# Patient Record
Sex: Female | Born: 1940 | Race: Black or African American | Hispanic: No | Marital: Married | Smoking: Never smoker
Health system: Southern US, Community
[De-identification: ages and names within clinical notes are randomized; demographics above are authoritative.]

## PROBLEM LIST (undated history)

## (undated) DIAGNOSIS — M1990 Unspecified osteoarthritis, unspecified site: Secondary | ICD-10-CM

## (undated) DIAGNOSIS — K219 Gastro-esophageal reflux disease without esophagitis: Secondary | ICD-10-CM

## (undated) DIAGNOSIS — R0602 Shortness of breath: Secondary | ICD-10-CM

## (undated) DIAGNOSIS — I499 Cardiac arrhythmia, unspecified: Secondary | ICD-10-CM

## (undated) DIAGNOSIS — I10 Essential (primary) hypertension: Secondary | ICD-10-CM

## (undated) DIAGNOSIS — I739 Peripheral vascular disease, unspecified: Secondary | ICD-10-CM

## (undated) DIAGNOSIS — E119 Type 2 diabetes mellitus without complications: Secondary | ICD-10-CM

## (undated) DIAGNOSIS — Z8719 Personal history of other diseases of the digestive system: Secondary | ICD-10-CM

## (undated) HISTORY — PX: EYE SURGERY: SHX253

## (undated) HISTORY — PX: JOINT REPLACEMENT: SHX530

## (undated) SURGERY — ARTHROPLASTY, HIP, TOTAL,POSTERIOR APPROACH
Anesthesia: General | Laterality: Left

---

## 1998-08-24 ENCOUNTER — Emergency Department (HOSPITAL_COMMUNITY): Admission: EM | Admit: 1998-08-24 | Discharge: 1998-08-24 | Payer: Self-pay | Admitting: Emergency Medicine

## 1999-03-20 ENCOUNTER — Other Ambulatory Visit: Admission: RE | Admit: 1999-03-20 | Discharge: 1999-03-20 | Payer: Self-pay | Admitting: Obstetrics

## 1999-04-23 ENCOUNTER — Encounter: Payer: Self-pay | Admitting: Emergency Medicine

## 1999-04-23 ENCOUNTER — Emergency Department (HOSPITAL_COMMUNITY): Admission: EM | Admit: 1999-04-23 | Discharge: 1999-04-23 | Payer: Self-pay | Admitting: Emergency Medicine

## 1999-05-08 ENCOUNTER — Encounter: Admission: RE | Admit: 1999-05-08 | Discharge: 1999-05-21 | Payer: Self-pay | Admitting: Orthopedic Surgery

## 1999-09-09 ENCOUNTER — Other Ambulatory Visit: Admission: RE | Admit: 1999-09-09 | Discharge: 1999-09-09 | Payer: Self-pay | Admitting: Obstetrics

## 1999-09-09 ENCOUNTER — Encounter (INDEPENDENT_AMBULATORY_CARE_PROVIDER_SITE_OTHER): Payer: Self-pay | Admitting: Specialist

## 1999-11-21 ENCOUNTER — Ambulatory Visit (HOSPITAL_COMMUNITY): Admission: RE | Admit: 1999-11-21 | Discharge: 1999-11-21 | Payer: Self-pay | Admitting: Family Medicine

## 1999-11-21 ENCOUNTER — Encounter: Payer: Self-pay | Admitting: Family Medicine

## 2000-06-17 ENCOUNTER — Ambulatory Visit (HOSPITAL_COMMUNITY): Admission: RE | Admit: 2000-06-17 | Discharge: 2000-06-17 | Payer: Self-pay | Admitting: Family Medicine

## 2000-06-17 ENCOUNTER — Encounter: Payer: Self-pay | Admitting: Family Medicine

## 2000-09-23 ENCOUNTER — Other Ambulatory Visit: Admission: RE | Admit: 2000-09-23 | Discharge: 2000-09-23 | Payer: Self-pay | Admitting: Obstetrics

## 2000-09-29 ENCOUNTER — Encounter: Payer: Self-pay | Admitting: Obstetrics

## 2000-09-29 ENCOUNTER — Ambulatory Visit (HOSPITAL_COMMUNITY): Admission: RE | Admit: 2000-09-29 | Discharge: 2000-09-29 | Payer: Self-pay | Admitting: Obstetrics

## 2002-01-23 ENCOUNTER — Emergency Department (HOSPITAL_COMMUNITY): Admission: EM | Admit: 2002-01-23 | Discharge: 2002-01-23 | Payer: Self-pay | Admitting: Emergency Medicine

## 2002-01-23 ENCOUNTER — Encounter: Payer: Self-pay | Admitting: Emergency Medicine

## 2002-02-02 ENCOUNTER — Ambulatory Visit (HOSPITAL_COMMUNITY): Admission: RE | Admit: 2002-02-02 | Discharge: 2002-02-02 | Payer: Self-pay | Admitting: Family Medicine

## 2002-03-23 ENCOUNTER — Encounter: Payer: Self-pay | Admitting: Family Medicine

## 2002-03-23 ENCOUNTER — Ambulatory Visit (HOSPITAL_COMMUNITY): Admission: RE | Admit: 2002-03-23 | Discharge: 2002-03-23 | Payer: Self-pay | Admitting: Family Medicine

## 2002-03-30 ENCOUNTER — Encounter (INDEPENDENT_AMBULATORY_CARE_PROVIDER_SITE_OTHER): Payer: Self-pay | Admitting: Specialist

## 2002-03-30 ENCOUNTER — Encounter: Payer: Self-pay | Admitting: Family Medicine

## 2002-03-30 ENCOUNTER — Encounter: Admission: RE | Admit: 2002-03-30 | Discharge: 2002-03-30 | Payer: Self-pay | Admitting: Family Medicine

## 2002-06-12 ENCOUNTER — Other Ambulatory Visit: Admission: RE | Admit: 2002-06-12 | Discharge: 2002-06-12 | Payer: Self-pay | Admitting: Obstetrics and Gynecology

## 2005-02-02 ENCOUNTER — Ambulatory Visit (HOSPITAL_COMMUNITY): Admission: RE | Admit: 2005-02-02 | Discharge: 2005-02-02 | Payer: Self-pay | Admitting: Internal Medicine

## 2005-02-05 ENCOUNTER — Ambulatory Visit (HOSPITAL_COMMUNITY): Admission: RE | Admit: 2005-02-05 | Discharge: 2005-02-05 | Payer: Self-pay | Admitting: Obstetrics

## 2005-03-06 ENCOUNTER — Emergency Department (HOSPITAL_COMMUNITY): Admission: EM | Admit: 2005-03-06 | Discharge: 2005-03-07 | Payer: Self-pay | Admitting: Emergency Medicine

## 2005-07-03 ENCOUNTER — Encounter: Admission: RE | Admit: 2005-07-03 | Discharge: 2005-07-03 | Payer: Self-pay | Admitting: Internal Medicine

## 2005-09-17 ENCOUNTER — Ambulatory Visit (HOSPITAL_COMMUNITY): Admission: RE | Admit: 2005-09-17 | Discharge: 2005-09-17 | Payer: Self-pay | Admitting: Ophthalmology

## 2006-01-07 ENCOUNTER — Encounter: Admission: RE | Admit: 2006-01-07 | Discharge: 2006-01-07 | Payer: Self-pay | Admitting: Internal Medicine

## 2006-02-15 ENCOUNTER — Encounter: Admission: RE | Admit: 2006-02-15 | Discharge: 2006-02-15 | Payer: Self-pay | Admitting: Family Medicine

## 2006-12-31 ENCOUNTER — Encounter: Admission: RE | Admit: 2006-12-31 | Discharge: 2007-01-11 | Payer: Self-pay | Admitting: Orthopedic Surgery

## 2007-03-15 ENCOUNTER — Ambulatory Visit (HOSPITAL_COMMUNITY): Admission: RE | Admit: 2007-03-15 | Discharge: 2007-03-15 | Payer: Self-pay | Admitting: Ophthalmology

## 2007-04-07 ENCOUNTER — Ambulatory Visit (HOSPITAL_COMMUNITY): Admission: RE | Admit: 2007-04-07 | Discharge: 2007-04-07 | Payer: Self-pay | Admitting: Ophthalmology

## 2007-06-16 ENCOUNTER — Encounter: Admission: RE | Admit: 2007-06-16 | Discharge: 2007-06-16 | Payer: Self-pay | Admitting: Orthopedic Surgery

## 2007-12-13 ENCOUNTER — Encounter: Admission: RE | Admit: 2007-12-13 | Discharge: 2007-12-13 | Payer: Self-pay | Admitting: Internal Medicine

## 2008-01-10 ENCOUNTER — Encounter: Admission: RE | Admit: 2008-01-10 | Discharge: 2008-01-10 | Payer: Self-pay | Admitting: Internal Medicine

## 2008-02-23 ENCOUNTER — Encounter: Admission: RE | Admit: 2008-02-23 | Discharge: 2008-02-23 | Payer: Self-pay | Admitting: Orthopedic Surgery

## 2008-03-29 ENCOUNTER — Inpatient Hospital Stay (HOSPITAL_COMMUNITY): Admission: RE | Admit: 2008-03-29 | Discharge: 2008-04-02 | Payer: Self-pay | Admitting: Orthopedic Surgery

## 2009-04-09 ENCOUNTER — Encounter: Admission: RE | Admit: 2009-04-09 | Discharge: 2009-06-11 | Payer: Self-pay | Admitting: Orthopedic Surgery

## 2009-04-25 ENCOUNTER — Encounter: Admission: RE | Admit: 2009-04-25 | Discharge: 2009-04-25 | Payer: Self-pay | Admitting: Orthopedic Surgery

## 2009-07-08 ENCOUNTER — Ambulatory Visit (HOSPITAL_COMMUNITY): Admission: RE | Admit: 2009-07-08 | Discharge: 2009-07-08 | Payer: Self-pay | Admitting: Ophthalmology

## 2009-08-08 ENCOUNTER — Encounter: Admission: RE | Admit: 2009-08-08 | Discharge: 2009-08-08 | Payer: Self-pay | Admitting: Orthopedic Surgery

## 2010-01-23 ENCOUNTER — Ambulatory Visit (HOSPITAL_COMMUNITY): Admission: RE | Admit: 2010-01-23 | Discharge: 2010-01-23 | Payer: Self-pay | Admitting: Orthopedic Surgery

## 2010-03-04 ENCOUNTER — Encounter: Admission: RE | Admit: 2010-03-04 | Discharge: 2010-03-04 | Payer: Self-pay | Admitting: Orthopedic Surgery

## 2010-03-25 ENCOUNTER — Emergency Department (HOSPITAL_COMMUNITY): Admission: EM | Admit: 2010-03-25 | Discharge: 2010-03-25 | Payer: Self-pay | Admitting: Emergency Medicine

## 2010-05-07 ENCOUNTER — Ambulatory Visit (HOSPITAL_COMMUNITY): Admission: RE | Admit: 2010-05-07 | Discharge: 2010-05-07 | Payer: Self-pay | Admitting: Obstetrics

## 2010-05-14 ENCOUNTER — Ambulatory Visit (HOSPITAL_COMMUNITY): Admission: RE | Admit: 2010-05-14 | Discharge: 2010-05-14 | Payer: Self-pay | Admitting: Obstetrics

## 2010-05-22 ENCOUNTER — Ambulatory Visit (HOSPITAL_COMMUNITY): Admission: RE | Admit: 2010-05-22 | Discharge: 2010-05-22 | Payer: Self-pay | Admitting: Obstetrics

## 2010-09-18 ENCOUNTER — Encounter
Admission: RE | Admit: 2010-09-18 | Discharge: 2010-09-18 | Payer: Self-pay | Source: Home / Self Care | Attending: Orthopedic Surgery | Admitting: Orthopedic Surgery

## 2010-11-13 LAB — BASIC METABOLIC PANEL
Calcium: 9.1 mg/dL (ref 8.4–10.5)
Chloride: 104 mEq/L (ref 96–112)
Creatinine, Ser: 1.02 mg/dL (ref 0.4–1.2)
GFR calc Af Amer: >60 mL/min (ref >60)

## 2010-11-13 LAB — CBC
MCV: 88.3 fL (ref 78.0–100.0)
Platelets: 264 10*3/uL (ref 150–400)
RBC: 4.04 MIL/uL (ref 3.87–5.11)
WBC: 9.5 10*3/uL (ref 4.0–10.5)

## 2010-11-15 LAB — URINALYSIS, ROUTINE W REFLEX MICROSCOPIC
Nitrite: NEGATIVE
Protein, ur: NEGATIVE mg/dL
Specific Gravity, Urine: 1.01 (ref 1.005–1.030)
Urobilinogen, UA: 0.2 mg/dL (ref 0.0–1.0)

## 2010-11-15 LAB — CBC
HCT: 33.5 % — ABNORMAL LOW (ref 36.0–46.0)
Hemoglobin: 11.3 g/dL — ABNORMAL LOW (ref 12.0–15.0)
MCV: 88.8 fL (ref 78.0–100.0)
RBC: 3.77 MIL/uL — ABNORMAL LOW (ref 3.87–5.11)
WBC: 9.7 10*3/uL (ref 4.0–10.5)

## 2010-11-15 LAB — BASIC METABOLIC PANEL
Chloride: 107 mEq/L (ref 96–112)
GFR calc Af Amer: >60 mL/min (ref >60)
Potassium: 3.4 mEq/L — ABNORMAL LOW (ref 3.5–5.1)
Sodium: 137 mEq/L (ref 135–145)

## 2010-11-15 LAB — POCT CARDIAC MARKERS
CKMB, poc: 2.4 ng/mL (ref 1.0–8.0)
Myoglobin, poc: 179 ng/mL (ref 12–200)
Troponin i, poc: <0.05 ng/mL (ref 0.00–0.09)
Troponin i, poc: <0.05 ng/mL (ref 0.00–0.09)

## 2010-11-15 LAB — URINE MICROSCOPIC-ADD ON

## 2010-11-15 LAB — DIFFERENTIAL
Eosinophils Relative: 0 % (ref 0–5)
Lymphocytes Relative: 26 % (ref 12–46)
Lymphs Abs: 2.5 10*3/uL (ref 0.7–4.0)
Monocytes Absolute: 0.6 10*3/uL (ref 0.1–1.0)

## 2011-01-13 ENCOUNTER — Ambulatory Visit: Payer: Medicare Other

## 2011-01-13 NOTE — Op Note (Signed)
NAMESERENNA, Brenda Kerr               ACCOUNT NO.:  [id number]   MEDICAL RECORD NO.:  [id number]          PATIENT TYPE:  AMB   LOCATION:  SDS                          FACILITY:  [facility]   PHYSICIAN:  Salley Scarlet., M.D.DATE OF BIRTH:  07-20-1941   DATE OF PROCEDURE:  04/07/2007  DATE OF DISCHARGE:  04/07/2007                               OPERATIVE REPORT   PREOPERATIVE DIAGNOSIS:  Immature cataract, right eye.   POSTOPERATIVE DIAGNOSIS:  Immature cataract, right eye.   OPERATION:  Kelman phacoemulsification, right eye.   ANESTHESIA:  Local using Xylocaine 2% with Marcaine 0.75%, [place].   PROCEDURE:  This is a 70 year old lady who has had cataract extraction  from the left eye approximately two years ago who complains of blurring  of vision with difficulty seeing from the right eye.  She was evaluated  and found to have a visual acuity best corrected 20/60 on the right and  20/40 on the left.  There was dense posterior subcapsular cataract  present on the right eye with intraocular lens present on her left.  She  has a history of glaucoma.  Her pressure is presently well-controlled on  medication.  Cataract extraction of the right eye was recommended.  She  is admitted at this time for that purpose.   PROCEDURE:  Under the influence of IV sedation and Van Lint akinesia,  retrobulbar anesthesia was given.  The patient was prepped and draped in  the usual manner.  The lid speculum was inserted under the upper and  lower lid of the right eye, and a 4-0 silk traction suture was passed  through the belly of the superior rectus muscle retraction.  A fornix-  based conjunctival flap was turned and hemostasis was achieved using  cautery.  An incision made in the sclera at the limbus.  This incision  was dissected down to clear cornea using a crescent blade.  A sideport  incision made at the 1:30 o'clock position.  Ocucoat was injected into  the eye through this sideport incision.   The anterior chamber was  entered through the corneoscleral tunnel incision at 11:30 o'clock  position using a 2.75-mm keratome.  An anterior capsulotomy was then  created with a bent 25-gauge needle.  The nucleus was hydrodissected  using Xylocaine.  The KPE handpiece was passed into the eye, and nucleus  was emulsified without difficulty.  The residual cortical material was  aspirated from the eye.  The posterior capsule was polished using the  polisher.  The wound was widened slightly to accommodate a foldable  silicone lens.  The lens was seated into the eye behind the iris without  difficulty.  The anterior chamber was reformed, and pupil was  constricted using Miochol.  The lips of the wound were hydrated and  tested make sure there was no leak.  After ascertaining there was no  leak, the conjunctiva was closed over wound using thermal cautery.  One  mL of Celestone and 0.5 cc of gentamicin were injected  subconjunctivally.  Maxitrol ophthalmic ointment and pilopine  ointment  applied along with a patch  and Fox shield.  The patient tolerated the  procedure well and was discharged to post-anesthesia recovery  satisfactory  condition.  She was instructed to rest today and to take Vicodin every 4  hours as needed for pain and to see me in the office tomorrow for  further evaluation.   DISCHARGE DIAGNOSIS:  Immature cataract, right eye.      Salley Scarlet., M.D.  Electronically Signed     TB/MEDQ  D:  04/08/2007  T:  04/08/2007  Job:  [id number]

## 2011-01-13 NOTE — Op Note (Signed)
NAMECHANNELLE, BOTTGER               ACCOUNT NO.:  [id number]   MEDICAL RECORD NO.:  [id number]          PATIENT TYPE:  INP   LOCATION:  2550                         FACILITY:  [facility]   PHYSICIAN:  Myrtie Neither, MD      DATE OF BIRTH:  11/20/1940   DATE OF PROCEDURE:  03/29/2008  DATE OF DISCHARGE:                               OPERATIVE REPORT   PREOPERATIVE DIAGNOSIS:  Degenerative joint disease, right hip.   POSTOPERATIVE DIAGNOSIS:  Degenerative joint disease, right hip.   ANESTHESIA:  General.   PROCEDURE:  Right total hip arthroplasty, Biomet implant.   PROCEDURE IN DETAIL:  The patient was taken to the operating room after  given adequate preop medications, given general anesthesia and  intubated.  The patient was placed in the right lateral position.  Right  hip was prepped with DuraPrep and draped in sterile manner.  Posterior  southern approach was made over the right hip going through skin and  subcutaneous tissue down to the fascia and the gluteal muscles.  Sharp  blunt dissection was made down to the short rotators and resection of  the capsule.  Femoral head was identified and hip was dislocated.  Femoral head cutting guide was put in place and femoral neck was cut.  First the acetabulum edges were debrided for better exposure, the wound  was very deep.  Reaming was started with a 45 mm to the acetabulum and  reaming was done up to 55 mm.  Reaming was done down to good bleeding  surface.  Trial acetabulum was put in place and found to seat quite  well.  A 56-mm porous-coated acetabular shell was put in place with 3  holes and was further stabilized with 3 cancellous screws.  Trial  polyethylene with 10-mm liner was put in place.  Attention was then  turned to the proximal femur, started out with a cookie cutter, followed  by an awl and then rasping was done up to 10 mm.  Head neck trial was  put in place and the -3 degree 36-mm head was found to be most stable  with  good full flexion, full extension, no telescoping, good internal  and external rotation, and good leg length.  Trial components were then  removed and the final acetabular poly with 10-degree liner was snapped  in place followed by placement of the 10-mm porous-coated modular  femoral component with a 36-mm head -3 put in place.  Hip was then  reduced and again flexion-extension was good with good leg length.  Hip  was good and stable without telescoping, full flexion and extension.  Copious irrigation was then done followed by wound closure, 0 Vicryl for  the fascia, 2-0 for the subcutaneous, and skin staples for the skin.  A  compressive dressing was applied.  The patient was placed in hip  abduction pillow.  The patient tolerated the procedure quite well and  went to recovery room in stable and satisfactory condition.  Approximated blood loss was 250 mL.      Myrtie Neither, MD  Electronically Signed  AC/MEDQ  D:  03/29/2008  T:  03/29/2008  Job:  [id number]

## 2011-01-16 NOTE — Discharge Summary (Signed)
NAMERANEEM, MENDOLIA               ACCOUNT NO.:  [id number]   MEDICAL RECORD NO.:  [id number]          PATIENT TYPE:  INP   LOCATION:  5038                         FACILITY:  [facility]   PHYSICIAN:  Myrtie Neither, MD      DATE OF BIRTH:  1941/07/26   DATE OF ADMISSION:  03/29/2008  DATE OF DISCHARGE:  04/02/2008                               DISCHARGE SUMMARY   ADMITTING DIAGNOSIS:  Degenerative joint disease, right hip.   DISCHARGE DIAGNOSIS:  Degenerative joint disease, right hip.   COMPLICATIONS:  None.   INFECTIONS:  None.   OPERATION:  Right total hip arthroplasty, Biomet implant.   PERTINENT HISTORY:  This is a 70 year old female who had been followed  in the office for degenerative joint disease involving her right hip.  The patient been treated with anti-inflammatories, use of cane with  progressive worsening of symptoms over the past several months.  The  patient had difficulty weight bearing, getting up from a sitting  position and night pain.   PERTINENT PHYSICAL:  The right hip tender anterolaterally with pain on  range of motion of both hip extension and rotation.  There is a limited  rotation of the right hip and minimal shortening on the right side.  Neurovascular status was intact.   HOSPITAL COURSE:  The patient underwent preop medical eval, CBC, EKG,  chest x-ray, PT/PTT, platelet count cement UA.  The patient's lab was  found to be stable enough to undergo the surgery.  The patient underwent  right total hip arthroplasty.  Tolerated the procedure quite well and  remained afebrile.   Postop course physical therapy, partial weightbearing on the right side  with use of walker.  OT and PT eval, H&H remained stable.  Pain was  brought under control.  The patient became stable enough to be  discharged with home health PT arranged.  Percocet 2 q.6 p.r.n., Feosol  325 mg t.i.d., Coumadin 7.5 mg daily.  Return to the office in one week  and weekly INRs.  The patient  was discharged in stable and satisfactory  condition.      Myrtie Neither, MD  Electronically Signed     AC/MEDQ  D:  05/16/2008  T:  05/16/2008  Job:  [id number]

## 2011-01-16 NOTE — Op Note (Signed)
Brenda Kerr, Brenda Kerr               ACCOUNT NO.:  [id number]   MEDICAL RECORD NO.:  [id number]          PATIENT TYPE:  AMB   LOCATION:  SDS                          FACILITY:  [facility]   PHYSICIAN:  Salley Scarlet., M.D.DATE OF BIRTH:  May 22, 1941   DATE OF PROCEDURE:  09/18/2005  DATE OF DISCHARGE:  09/17/2005                                 OPERATIVE REPORT   PREOPERATIVE DIAGNOSIS:  Immature cataract, left eye.   POSTOPERATIVE DIAGNOSIS:  Immature cataract, left eye.   OPERATION:  Kelman phacoemulsification of cataract left eye.   ANESTHESIA:  Local using Xylocaine 2% with Marcaine 0.75%.   JUSTIFICATION OF PROCEDURE:  This is a 70 year old lady who complains of  blurring of vision with difficulty seeing to read.  She was evaluated and  found to have bilateral cataracts with a visual acuity best corrected to  20/50 on the right, 20/70 on the left.  She has a history of glaucoma which  is presently well controlled.  Cataract extraction of the left eye was  recommended.  She is admitted at this time for the procedure.   PROCEDURE:  Under the influence of IV sedation, a Van Lint akinesia and  retrobulbar anesthesia was given.  The patient was prepped and draped in the  usual manner.  The lid speculum was inserted under the upper and lower lid  of the left eye and a 4-0 silk traction suture was passed through the belly  of the superior rectus muscle for retraction.  A fornix based conjunctival  flap was turned and hemostasis achieved using cautery.  An incision was made  in the sclera at the limbus.  This incision was dissected down to clear  cornea using a crescent blade.  A side port incision was made at the 1:30  o'clock position.  Ocucoat was injected into the eye through this side port  incision.  The anterior chamber was then entered through the corneoscleral  tunnel incision at the 11:30 o'clock position using a 2.75 mm keratome.  An  anterior capsulotomy was done using a  bent 25 gauge needle.  The nucleus was  hydrodissected using Xylocaine.  The KPE handpiece was passed into the eye  and the nucleus was emulsified without difficulty.  The residual cortical  material was aspirated.  The posterior capsule was polished using an olive  tipped polisher.  The wound was widened slightly to accommodate a foldable  silicone lens.  This lens was seated into the eye behind the iris without  difficulty.  The anterior chamber was reformed and the pupil was constricted  using Miochol.  The lips of the wound were hydrated and tested to make sure  there was no leak.  After ascertaining there was no leak, the conjunctiva  was closed over the wound using thermal cautery.  1 mL of Celestone and 0.5  mL of gentamicin were injected subconjuctivally.  Maxitrol ophthalmic  ointment and Pilopine ointment were applied along with a patch and a Fox  shield.  The patient tolerated the procedure well and was discharged to the  post  anesthesia recovery room  in satisfactory condition.  She is instructed to  rest today, to take Vicodin every 4 hours as needed for pain, and to see me  in the office tomorrow for further evaluation.   DISCHARGE DIAGNOSIS:  Immature cataract, left eye.      Salley Scarlet., M.D.  Electronically Signed     TB/MEDQ  D:  09/18/2005  T:  09/18/2005  Job:  [id number]

## 2011-01-19 ENCOUNTER — Ambulatory Visit: Payer: Medicare Other | Admitting: Physical Therapy

## 2011-05-22 ENCOUNTER — Emergency Department (HOSPITAL_COMMUNITY)
Admission: EM | Admit: 2011-05-22 | Discharge: 2011-05-22 | Disposition: A | Discharge status: Home / Self Care | Payer: Medicare Other | Attending: Emergency Medicine | Admitting: Emergency Medicine

## 2011-05-22 ENCOUNTER — Encounter: Payer: Self-pay | Admitting: Emergency Medicine

## 2011-05-22 ENCOUNTER — Emergency Department (HOSPITAL_COMMUNITY): Payer: Medicare Other

## 2011-05-22 ENCOUNTER — Other Ambulatory Visit: Payer: Self-pay

## 2011-05-22 DIAGNOSIS — I10 Essential (primary) hypertension: Secondary | ICD-10-CM | POA: Insufficient documentation

## 2011-05-22 DIAGNOSIS — M1990 Unspecified osteoarthritis, unspecified site: Secondary | ICD-10-CM | POA: Insufficient documentation

## 2011-05-22 DIAGNOSIS — H409 Unspecified glaucoma: Secondary | ICD-10-CM | POA: Insufficient documentation

## 2011-05-22 DIAGNOSIS — R0989 Other specified symptoms and signs involving the circulatory and respiratory systems: Secondary | ICD-10-CM | POA: Insufficient documentation

## 2011-05-22 DIAGNOSIS — R0609 Other forms of dyspnea: Secondary | ICD-10-CM | POA: Insufficient documentation

## 2011-05-22 DIAGNOSIS — R0600 Dyspnea, unspecified: Secondary | ICD-10-CM

## 2011-05-22 HISTORY — DX: Essential (primary) hypertension: I10

## 2011-05-22 LAB — URINALYSIS, ROUTINE W REFLEX MICROSCOPIC
Bilirubin Urine: NEGATIVE
Ketones, ur: NEGATIVE mg/dL
Leukocytes, UA: NEGATIVE
Nitrite: NEGATIVE
Protein, ur: NEGATIVE mg/dL
Urobilinogen, UA: 0.2 mg/dL (ref 0.0–1.0)

## 2011-05-22 LAB — DIFFERENTIAL
Basophils Absolute: 0 10*3/uL (ref 0.0–0.1)
Eosinophils Relative: 3 % (ref 0–5)
Lymphocytes Relative: 27 % (ref 12–46)
Lymphs Abs: 1.9 10*3/uL (ref 0.7–4.0)
Neutro Abs: 4 10*3/uL (ref 1.7–7.7)

## 2011-05-22 LAB — CBC
MCV: 86.3 fL (ref 78.0–100.0)
Platelets: 230 10*3/uL (ref 150–400)
RBC: 4.6 MIL/uL (ref 3.87–5.11)
WBC: 7 10*3/uL (ref 4.0–10.5)

## 2011-05-22 LAB — CK TOTAL AND CKMB (NOT AT ARMC)
CK, MB: 3.5 ng/mL (ref 0.3–4.0)
Relative Index: 1.7 (ref 0.0–2.5)
Total CK: 210 U/L — ABNORMAL HIGH (ref 7–177)

## 2011-05-22 LAB — TROPONIN I: Troponin I: <0.3 ng/mL (ref <0.30)

## 2011-05-22 NOTE — ED Provider Notes (Signed)
History    Chart scribed for Donnetta Hutching, MD by Enos Fling; the patient was seen in room APA18/APA18; this patient's care was started at 7:33 AM.    CSN: [id number] Arrival date & time: 05/22/2011  6:26 AM  Chief Complaint  Patient presents with  . Shortness of Breath    HPI Brenda Kerr is a 70 y.o. female who presents to the Emergency Department complaining of sob. Pt reports cold chills intermittently x 4-5 days. This morning chills worse (shaky/shivering) with increased sob. Pt reports SOB and BLE swelling is not new for her, but slightly worse than usual. Pt also c/o heaviness in her head (also not new and unchanged from usual occasional episodes). No recent sick contacts. No change in vision, photophobia, or neck pain. No fever, cough, congestion, n/v/d, urinary complaints, or abd pain.  PCP Dr. Parke Simmers  Past Medical History  Diagnosis Date  . Hypertension   . Glaucoma   Arthritis  Past Surgical History  Procedure Date  . Joint replacement     hip replacement  . Eye surgery     History reviewed. No pertinent family history.  History  Substance Use Topics  . Smoking status: Never Smoker   . Smokeless tobacco: Not on file  . Alcohol Use: No    OB History    Grav Para Term Preterm Abortions TAB SAB Ect Mult Living                 Allergies  Codeine  Home Medications   Current Outpatient Rx  Name Route Sig Dispense Refill  . AMLODIPINE BESYLATE 10 MG PO TABS Oral Take 10 mg by mouth daily.      . ASPIRIN 81 MG PO TABS Oral Take 81 mg by mouth daily.      Marland Kitchen BIMATOPROST 0.01 % OP SOLN Both Eyes Place 1 drop into both eyes at bedtime.      Marland Kitchen BRIMONIDINE TARTRATE-TIMOLOL 0.2-0.5 % OP SOLN Both Eyes Place 1 drop into both eyes every 12 (twelve) hours.      . FUROSEMIDE 20 MG PO TABS Oral Take 20 mg by mouth daily.     . IBUPROFEN 200 MG PO TABS Oral Take 400 mg by mouth every 6 (six) hours as needed. Pain     . LABETALOL HCL 100 MG PO TABS Oral Take 100 mg by  mouth 2 (two) times daily.      Marland Kitchen BRINZOLAMIDE 1 % OP SUSP Both Eyes Place 1 drop into both eyes 3 (three) times daily.         Review of Systems  Review of Systems 10 Systems reviewed and are negative for acute change except as noted in the HPI.   Physical Exam    BP 104/82  Pulse 39  Temp(Src) 97.9 F (36.6 C) (Oral)  Resp 16  Ht 5\' 2"  (1.575 m)  Wt 235 lb (106.595 kg)  BMI 42.98 kg/m2  SpO2 97%  Physical Exam  Nursing note and vitals reviewed. Constitutional: She is oriented to person, place, and time. She appears well-developed and well-nourished. No distress.       Pt well-appearing, resting comfortably. Appearance consistent with age of record  HENT:  Head: Normocephalic and atraumatic.  Right Ear: External ear normal.  Left Ear: External ear normal.  Nose: Nose normal.  Mouth/Throat: Oropharynx is clear and moist and mucous membranes are normal.  Eyes: Conjunctivae are normal.  Neck: Normal range of motion. Neck supple.  Cardiovascular: Normal rate,  regular rhythm and intact distal pulses.  Exam reveals no gallop and no friction rub.   No murmur heard. Pulmonary/Chest: Effort normal and breath sounds normal. She has no wheezes. She has no rhonchi. She has no rales. She exhibits no tenderness.  Abdominal: Soft. There is no tenderness.  Musculoskeletal: Normal range of motion. She exhibits edema (1+ peripheral edema BLE).  Neurological: She is alert and oriented to person, place, and time. No sensory deficit.  Skin: Skin is warm and dry. No rash noted.       Color normal  Psychiatric: She has a normal mood and affect.     Procedures - none  OTHER DATA REVIEWED: Nursing notes and vital signs reviewed. Prior records reviewed.   DIAGNOSTIC STUDIES: Oxygen Saturation is 100% on room air, normal by my Interpretation.  EKG:   Date: 05/22/2011  Rate: 68  Rhythm: normal sinus rhythm  QRS Axis: normal  Intervals: normal  ST/T Wave abnormalities: normal   Conduction Disutrbances:none  Narrative Interpretation: Normal EKG  Old EKG Reviewed: unchanged (03/25/10)    LABS / RADIOLOGY: Results for orders placed during the hospital encounter of 05/22/11  CBC      Component Value Range   WBC 7.0  4.0 - 10.5 (K/uL)   RBC 4.60  3.87 - 5.11 (MIL/uL)   Hemoglobin 12.8  12.0 - 15.0 (g/dL)   HCT 40.9  81.1 - 91.4 (%)   MCV 86.3  78.0 - 100.0 (fL)   MCH 27.8  26.0 - 34.0 (pg)   MCHC 32.2  30.0 - 36.0 (g/dL)   RDW 78.2 (*) 95.6 - 15.5 (%)   Platelets 230  150 - 400 (K/uL)  DIFFERENTIAL      Component Value Range   Neutrophils Relative 57  43 - 77 (%)   Neutro Abs 4.0  1.7 - 7.7 (K/uL)   Lymphocytes Relative 27  12 - 46 (%)   Lymphs Abs 1.9  0.7 - 4.0 (K/uL)   Monocytes Relative 13 (*) 3 - 12 (%)   Monocytes Absolute 0.9  0.1 - 1.0 (K/uL)   Eosinophils Relative 3  0 - 5 (%)   Eosinophils Absolute 0.2  0.0 - 0.7 (K/uL)   Basophils Relative 0  0 - 1 (%)   Basophils Absolute 0.0  0.0 - 0.1 (K/uL)  CK TOTAL AND CKMB      Component Value Range   Total CK 210 (*) 7 - 177 (U/L)   CK, MB 3.5  0.3 - 4.0 (ng/mL)   Relative Index 1.7  0.0 - 2.5   TROPONIN I      Component Value Range   Troponin I <0.30  <0.30 (ng/mL)  URINALYSIS, ROUTINE W REFLEX MICROSCOPIC      Component Value Range   Color, Urine YELLOW  YELLOW    Appearance CLEAR  CLEAR    Specific Gravity, Urine <1.005 (*) 1.005 - 1.030    pH 7.0  5.0 - 8.0    Glucose, UA NEGATIVE  NEGATIVE (mg/dL)   Hgb urine dipstick NEGATIVE  NEGATIVE    Bilirubin Urine NEGATIVE  NEGATIVE    Ketones, ur NEGATIVE  NEGATIVE (mg/dL)   Protein, ur NEGATIVE  NEGATIVE (mg/dL)   Urobilinogen, UA 0.2  0.0 - 1.0 (mg/dL)   Nitrite NEGATIVE  NEGATIVE    Leukocytes, UA NEGATIVE  NEGATIVE   GLUCOSE, CAPILLARY      Component Value Range   Glucose-Capillary 102 (*) 70 - 99 (mg/dL)    Dg Chest  Portable 1 View  05/22/2011  *RADIOLOGY REPORT*  Clinical Data: Shortness of breath, panic attack  PORTABLE CHEST - 1  VIEW  Comparison: 03/25/2010; 03/28/2008  Findings: Examination minimally limited secondary to portable technique and patient body habitus.  Grossly unchanged enlarged cardiac silhouette and mediastinal contours with atherosclerotic calcifications within the aortic arch.  There is persistent elevation of the right hemidiaphragm.  Perihilar and bibasilar heterogeneous opacities likely atelectasis.  No new focal parenchymal opacities.  No definite pleural effusion or pneumothorax.  Grossly unchanged bones.  IMPRESSION: No definite acute cardiopulmonary disease on this portable examination.  Further evaluation may be performed with a PA and lateral chest radiograph as clinically indicated.  Original Report Authenticated By: Waynard Reeds, M.D.     ED COURSE: 7:36 AM - Initial H&P performed. Will give fluids and order CBC, cardiac markers, chest x-ray, EKG, and UA. 11:23 AM - All labs and radiology discussed with pt, no acute findings, pt will f/u with PCP   MDM: Patient has vague complaints of chills, shortness of breath, questionable dysuria. These have been going on for an uncertain length of time. Physical exam is completely normal. She does not appear in distress. Laboratory valves reviewed and normal. Can follow up with primary care  IMPRESSION: 1. Dyspnea      PLAN: Discharge with PCP follow up  All results reviewed and discussed with pt, questions answered, pt agreeable with plan.   MEDS GIVEN IN ED: none  DISCHARGE MEDICATIONS: none  SCRIBE ATTESTATION: I personally performed the services described in this documentation, which was scribed in my presence. The recorded information has been reviewed and considered. Donnetta Hutching, MD       Donnetta Hutching, MD 05/22/11 1125

## 2011-05-22 NOTE — ED Notes (Signed)
Pt being eva by edp

## 2011-05-22 NOTE — ED Notes (Signed)
Pt waiting to be reeval and disposition 

## 2011-05-22 NOTE — ED Notes (Signed)
Awakened with sob , alert, sinus rhythm,  No distress.

## 2011-05-22 NOTE — ED Notes (Signed)
Patient states she woke up this morning feeling shaky and shivering; states she thought it was a panic attack.  States she took her BP med and an Aspirin.  Patient states she then became short of breath and states her head feels heavy; states her nose feels stuffy.

## 2011-05-22 NOTE — ED Notes (Signed)
Pt waiting for discharge. Pt aware

## 2011-05-29 LAB — CROSSMATCH
ABO/RH(D): O POS
Antibody Screen: NEGATIVE

## 2011-05-29 LAB — COMPREHENSIVE METABOLIC PANEL
ALT: 20
CO2: 25
Calcium: 9.4
Creatinine, Ser: 0.87
GFR calc non Af Amer: >60
Glucose, Bld: 116 — ABNORMAL HIGH
Sodium: 135
Total Bilirubin: 0.8

## 2011-05-29 LAB — CBC
Hemoglobin: 14.2
Hemoglobin: 9.4 — ABNORMAL LOW
MCHC: 34.2
MCV: 86.1
Platelets: 235
RBC: 3.57 — ABNORMAL LOW
RBC: 4.84
RBC: 3.21 — ABNORMAL LOW
WBC: 14.3 — ABNORMAL HIGH

## 2011-05-29 LAB — URINALYSIS, ROUTINE W REFLEX MICROSCOPIC
Bilirubin Urine: NEGATIVE
Glucose, UA: NEGATIVE
Ketones, ur: NEGATIVE
Protein, ur: NEGATIVE

## 2011-05-29 LAB — PROTIME-INR
INR: 1.1
INR: 1.1
INR: 1.5
Prothrombin Time: 12.8
Prothrombin Time: 14.1
Prothrombin Time: 14.9
Prothrombin Time: 17.7 — ABNORMAL HIGH
Prothrombin Time: 19.2 — ABNORMAL HIGH

## 2011-05-29 LAB — ABO/RH: ABO/RH(D): O POS

## 2011-06-15 LAB — CBC
HCT: 39.3
MCV: 85.2
Platelets: 256
RDW: 14.2 — ABNORMAL HIGH
WBC: 13.2 — ABNORMAL HIGH

## 2011-06-15 LAB — BASIC METABOLIC PANEL
BUN: 10
Chloride: 105
Creatinine, Ser: 0.9
GFR calc non Af Amer: >60
Glucose, Bld: 95
Potassium: 3.8

## 2011-10-06 DIAGNOSIS — H4011X0 Primary open-angle glaucoma, stage unspecified: Secondary | ICD-10-CM | POA: Diagnosis not present

## 2011-11-11 ENCOUNTER — Ambulatory Visit
Admission: RE | Admit: 2011-11-11 | Discharge: 2011-11-11 | Disposition: A | Discharge status: Home / Self Care | Payer: Medicare Other | Source: Ambulatory Visit | Attending: Orthopedic Surgery | Admitting: Orthopedic Surgery

## 2011-11-11 ENCOUNTER — Other Ambulatory Visit: Payer: Self-pay | Admitting: Orthopedic Surgery

## 2011-11-11 DIAGNOSIS — M169 Osteoarthritis of hip, unspecified: Secondary | ICD-10-CM | POA: Diagnosis not present

## 2011-11-11 DIAGNOSIS — M25559 Pain in unspecified hip: Secondary | ICD-10-CM | POA: Diagnosis not present

## 2011-11-11 DIAGNOSIS — M25552 Pain in left hip: Secondary | ICD-10-CM

## 2011-11-11 DIAGNOSIS — M25551 Pain in right hip: Secondary | ICD-10-CM

## 2011-11-11 DIAGNOSIS — M25859 Other specified joint disorders, unspecified hip: Secondary | ICD-10-CM | POA: Diagnosis not present

## 2011-11-16 DIAGNOSIS — M169 Osteoarthritis of hip, unspecified: Secondary | ICD-10-CM | POA: Diagnosis not present

## 2011-12-14 DIAGNOSIS — M169 Osteoarthritis of hip, unspecified: Secondary | ICD-10-CM | POA: Diagnosis not present

## 2012-01-07 DIAGNOSIS — E119 Type 2 diabetes mellitus without complications: Secondary | ICD-10-CM | POA: Diagnosis not present

## 2012-01-11 DIAGNOSIS — I10 Essential (primary) hypertension: Secondary | ICD-10-CM | POA: Diagnosis not present

## 2012-01-13 DIAGNOSIS — M169 Osteoarthritis of hip, unspecified: Secondary | ICD-10-CM | POA: Diagnosis not present

## 2012-02-10 DIAGNOSIS — M169 Osteoarthritis of hip, unspecified: Secondary | ICD-10-CM | POA: Diagnosis not present

## 2012-03-01 ENCOUNTER — Encounter (HOSPITAL_COMMUNITY): Payer: Self-pay | Admitting: Pharmacy Technician

## 2012-03-01 ENCOUNTER — Other Ambulatory Visit: Payer: Self-pay | Admitting: Orthopedic Surgery

## 2012-03-04 ENCOUNTER — Inpatient Hospital Stay (HOSPITAL_COMMUNITY): Admission: RE | Admit: 2012-03-04 | Payer: Medicare Other | Source: Ambulatory Visit

## 2012-03-04 ENCOUNTER — Encounter (HOSPITAL_COMMUNITY)
Admission: RE | Admit: 2012-03-04 | Discharge: 2012-03-04 | Disposition: A | Discharge status: Home / Self Care | Payer: Medicare Other | Source: Ambulatory Visit | Attending: Orthopedic Surgery | Admitting: Orthopedic Surgery

## 2012-03-04 ENCOUNTER — Encounter (HOSPITAL_COMMUNITY): Payer: Self-pay

## 2012-03-04 DIAGNOSIS — Z01811 Encounter for preprocedural respiratory examination: Secondary | ICD-10-CM | POA: Diagnosis not present

## 2012-03-04 HISTORY — DX: Unspecified osteoarthritis, unspecified site: M19.90

## 2012-03-04 HISTORY — DX: Gastro-esophageal reflux disease without esophagitis: K21.9

## 2012-03-04 HISTORY — DX: Peripheral vascular disease, unspecified: I73.9

## 2012-03-04 HISTORY — DX: Shortness of breath: R06.02

## 2012-03-04 HISTORY — DX: Cardiac arrhythmia, unspecified: I49.9

## 2012-03-04 LAB — SURGICAL PCR SCREEN
MRSA, PCR: NEGATIVE
Staphylococcus aureus: NEGATIVE

## 2012-03-04 LAB — COMPREHENSIVE METABOLIC PANEL
ALT: 17 U/L (ref 0–35)
Calcium: 9.9 mg/dL (ref 8.4–10.5)
GFR calc Af Amer: 66 mL/min — ABNORMAL LOW (ref >90)
Glucose, Bld: 98 mg/dL (ref 70–99)
Sodium: 141 mEq/L (ref 135–145)
Total Protein: 7.5 g/dL (ref 6.0–8.3)

## 2012-03-04 LAB — TYPE AND SCREEN
ABO/RH(D): O POS
Antibody Screen: NEGATIVE

## 2012-03-04 LAB — CBC
Hemoglobin: 14.6 g/dL (ref 12.0–15.0)
MCH: 28.5 pg (ref 26.0–34.0)
MCHC: 33.6 g/dL (ref 30.0–36.0)
Platelets: 255 10*3/uL (ref 150–400)

## 2012-03-04 LAB — URINALYSIS, ROUTINE W REFLEX MICROSCOPIC
Bilirubin Urine: NEGATIVE
Hgb urine dipstick: NEGATIVE
Ketones, ur: NEGATIVE mg/dL
Protein, ur: NEGATIVE mg/dL
Urobilinogen, UA: 0.2 mg/dL (ref 0.0–1.0)

## 2012-03-04 LAB — PROTIME-INR: INR: 1.02 (ref 0.00–1.49)

## 2012-03-04 NOTE — Pre-Procedure Instructions (Signed)
20 Brenda Kerr  03/04/2012   Your procedure is scheduled on:  Wed, July 10 @ 8:30 AM  Report to [facility] at 6:30 AM.  Call this number if you have problems the morning of surgery: [phone]   Remember:   Do not eat food:After Midnight.    Take these medicines the morning of surgery with A SIP OF WATER: Amlodipine(Norvasc),Eye Drops,and Labetalol(Normodyne)   Do not wear jewelry, make-up or nail polish.  Do not wear lotions, powders, or perfumes.   Do not shave 48 hours prior to surgery.   Do not bring valuables to the hospital.  Contacts, dentures or bridgework may not be worn into surgery.  Leave suitcase in the car. After surgery it may be brought to your room.  For patients admitted to the hospital, checkout time is 11:00 AM the day of discharge.   Patients discharged the day of surgery will not be allowed to drive home.  Special Instructions: CHG Shower Use Special Wash: 1/2 bottle night before surgery and 1/2 bottle morning of surgery.   Please read over the following fact sheets that you were given: Pain Booklet, Coughing and Deep Breathing, Blood Transfusion Information, Total Joint Packet, MRSA Information and Surgical Site Infection Prevention

## 2012-03-08 MED ORDER — CEFAZOLIN SODIUM-DEXTROSE 2-3 GM-% IV SOLR
2.0000 g | INTRAVENOUS | Status: AC
  Administered 2012-03-09: 2 g via INTRAVENOUS
  Filled 2012-03-08: qty 50

## 2012-03-09 ENCOUNTER — Inpatient Hospital Stay (HOSPITAL_COMMUNITY): Payer: Medicare Other

## 2012-03-09 ENCOUNTER — Ambulatory Visit (HOSPITAL_COMMUNITY): Payer: Medicare Other | Admitting: Anesthesiology

## 2012-03-09 ENCOUNTER — Encounter (HOSPITAL_COMMUNITY): Payer: Self-pay | Admitting: Anesthesiology

## 2012-03-09 ENCOUNTER — Encounter (HOSPITAL_COMMUNITY): Payer: Self-pay | Admitting: *Deleted

## 2012-03-09 ENCOUNTER — Other Ambulatory Visit: Payer: Self-pay | Admitting: Orthopedic Surgery

## 2012-03-09 ENCOUNTER — Inpatient Hospital Stay (HOSPITAL_COMMUNITY)
Admission: RE | Admit: 2012-03-09 | Discharge: 2012-03-12 | DRG: 470 | Disposition: A | Discharge status: Home Health | Payer: Medicare Other | Source: Ambulatory Visit | Attending: Orthopedic Surgery | Admitting: Orthopedic Surgery

## 2012-03-09 ENCOUNTER — Encounter (HOSPITAL_COMMUNITY)
Admission: RE | Disposition: A | Discharge status: Home Health | Payer: Self-pay | Source: Ambulatory Visit | Attending: Orthopedic Surgery

## 2012-03-09 DIAGNOSIS — D62 Acute posthemorrhagic anemia: Secondary | ICD-10-CM | POA: Diagnosis not present

## 2012-03-09 DIAGNOSIS — I739 Peripheral vascular disease, unspecified: Secondary | ICD-10-CM | POA: Diagnosis present

## 2012-03-09 DIAGNOSIS — M169 Osteoarthritis of hip, unspecified: Principal | ICD-10-CM | POA: Diagnosis present

## 2012-03-09 DIAGNOSIS — Z96649 Presence of unspecified artificial hip joint: Secondary | ICD-10-CM

## 2012-03-09 DIAGNOSIS — K219 Gastro-esophageal reflux disease without esophagitis: Secondary | ICD-10-CM | POA: Diagnosis present

## 2012-03-09 DIAGNOSIS — H409 Unspecified glaucoma: Secondary | ICD-10-CM | POA: Diagnosis present

## 2012-03-09 DIAGNOSIS — M1610 Unilateral primary osteoarthritis, unspecified hip: Secondary | ICD-10-CM | POA: Diagnosis not present

## 2012-03-09 DIAGNOSIS — M25559 Pain in unspecified hip: Secondary | ICD-10-CM | POA: Diagnosis not present

## 2012-03-09 DIAGNOSIS — I10 Essential (primary) hypertension: Secondary | ICD-10-CM | POA: Diagnosis present

## 2012-03-09 DIAGNOSIS — Z01812 Encounter for preprocedural laboratory examination: Secondary | ICD-10-CM

## 2012-03-09 DIAGNOSIS — G8918 Other acute postprocedural pain: Secondary | ICD-10-CM

## 2012-03-09 HISTORY — DX: Personal history of other diseases of the digestive system: Z87.19

## 2012-03-09 HISTORY — PX: ARTHROPLASTY: SHX135

## 2012-03-09 HISTORY — PX: TOTAL HIP ARTHROPLASTY: SHX124

## 2012-03-09 SURGERY — ARTHROPLASTY, HIP, TOTAL,POSTERIOR APPROACH
Anesthesia: General | Site: Hip | Laterality: Left | Wound class: Clean

## 2012-03-09 MED ORDER — VECURONIUM BROMIDE 10 MG IV SOLR
INTRAVENOUS | Status: DC | PRN
  Administered 2012-03-09: 1 mg via INTRAVENOUS
  Administered 2012-03-09: 2 mg via INTRAVENOUS

## 2012-03-09 MED ORDER — DEXAMETHASONE SODIUM PHOSPHATE 4 MG/ML IJ SOLN
INTRAMUSCULAR | Status: DC | PRN
  Administered 2012-03-09: 4 mg via INTRAVENOUS

## 2012-03-09 MED ORDER — ALUM & MAG HYDROXIDE-SIMETH 200-200-20 MG/5ML PO SUSP: 30.0000 mL | ORAL | Status: DC | PRN

## 2012-03-09 MED ORDER — BRIMONIDINE TARTRATE 0.2 % OP SOLN
1.0000 [drp] | Freq: Two times a day (BID) | OPHTHALMIC | Status: DC
  Filled 2012-03-09: qty 5

## 2012-03-09 MED ORDER — METHOCARBAMOL 500 MG PO TABS
500.0000 mg | ORAL_TABLET | Freq: Four times a day (QID) | ORAL | Status: DC | PRN
  Administered 2012-03-11 – 2012-03-12 (×3): 500 mg via ORAL
  Filled 2012-03-09 (×3): qty 1

## 2012-03-09 MED ORDER — BIMATOPROST 0.01 % OP SOLN
1.0000 [drp] | Freq: Every day | OPHTHALMIC | Status: DC
  Administered 2012-03-09 – 2012-03-11 (×3): 1 [drp] via OPHTHALMIC
  Filled 2012-03-09: qty 2.5

## 2012-03-09 MED ORDER — SODIUM CHLORIDE 0.9 % IJ SOLN: 9.0000 mL | INTRAMUSCULAR | Status: DC | PRN

## 2012-03-09 MED ORDER — WARFARIN SODIUM 5 MG PO TABS
5.0000 mg | ORAL_TABLET | Freq: Once | ORAL | Status: AC
  Administered 2012-03-09: 5 mg via ORAL
  Filled 2012-03-09: qty 1

## 2012-03-09 MED ORDER — HYDROMORPHONE 0.3 MG/ML IV SOLN
INTRAVENOUS | Status: AC
  Filled 2012-03-09: qty 25

## 2012-03-09 MED ORDER — LIDOCAINE HCL (CARDIAC) 20 MG/ML IV SOLN
INTRAVENOUS | Status: DC | PRN
  Administered 2012-03-09: 80 mg via INTRAVENOUS

## 2012-03-09 MED ORDER — ONDANSETRON HCL 4 MG/2ML IJ SOLN
INTRAMUSCULAR | Status: DC | PRN
  Administered 2012-03-09: 4 mg via INTRAVENOUS

## 2012-03-09 MED ORDER — ACETAMINOPHEN 10 MG/ML IV SOLN: 15.0000 mg/kg | Freq: Four times a day (QID) | INTRAVENOUS | Status: DC

## 2012-03-09 MED ORDER — ACETAMINOPHEN 325 MG PO TABS
650.0000 mg | ORAL_TABLET | Freq: Four times a day (QID) | ORAL | Status: DC | PRN
  Administered 2012-03-12: 650 mg via ORAL
  Filled 2012-03-09: qty 2

## 2012-03-09 MED ORDER — DEXTROSE-NACL 5-0.45 % IV SOLN
INTRAVENOUS | Status: DC
  Administered 2012-03-10 (×2): via INTRAVENOUS

## 2012-03-09 MED ORDER — LACTATED RINGERS IV SOLN
INTRAVENOUS | Status: DC | PRN
  Administered 2012-03-09 (×2): via INTRAVENOUS

## 2012-03-09 MED ORDER — CHLORHEXIDINE GLUCONATE 4 % EX LIQD: 60.0000 mL | Freq: Once | CUTANEOUS | Status: DC

## 2012-03-09 MED ORDER — PROPOFOL 10 MG/ML IV EMUL
INTRAVENOUS | Status: DC | PRN
  Administered 2012-03-09: 130 mg via INTRAVENOUS

## 2012-03-09 MED ORDER — CEFAZOLIN SODIUM-DEXTROSE 2-3 GM-% IV SOLR
2.0000 g | Freq: Four times a day (QID) | INTRAVENOUS | Status: AC
  Administered 2012-03-09 (×2): 2 g via INTRAVENOUS
  Filled 2012-03-09 (×4): qty 50

## 2012-03-09 MED ORDER — ONDANSETRON HCL 4 MG/2ML IJ SOLN: 4.0000 mg | Freq: Once | INTRAMUSCULAR | Status: DC | PRN

## 2012-03-09 MED ORDER — ACETAMINOPHEN 650 MG RE SUPP: 650.0000 mg | Freq: Four times a day (QID) | RECTAL | Status: DC | PRN

## 2012-03-09 MED ORDER — FERROUS SULFATE 325 (65 FE) MG PO TABS
325.0000 mg | ORAL_TABLET | Freq: Three times a day (TID) | ORAL | Status: DC
  Administered 2012-03-10 – 2012-03-12 (×7): 325 mg via ORAL
  Filled 2012-03-09 (×11): qty 1

## 2012-03-09 MED ORDER — METOCLOPRAMIDE HCL 10 MG PO TABS: 5.0000 mg | ORAL_TABLET | Freq: Three times a day (TID) | ORAL | Status: DC | PRN

## 2012-03-09 MED ORDER — MIDAZOLAM HCL 5 MG/5ML IJ SOLN
INTRAMUSCULAR | Status: DC | PRN
  Administered 2012-03-09: 2 mg via INTRAVENOUS

## 2012-03-09 MED ORDER — BRIMONIDINE TARTRATE 0.2 % OP SOLN
1.0000 [drp] | Freq: Two times a day (BID) | OPHTHALMIC | Status: DC
  Administered 2012-03-09 – 2012-03-12 (×6): 1 [drp] via OPHTHALMIC
  Filled 2012-03-09: qty 5

## 2012-03-09 MED ORDER — METHOCARBAMOL 100 MG/ML IJ SOLN
500.0000 mg | Freq: Four times a day (QID) | INTRAVENOUS | Status: DC | PRN
  Filled 2012-03-09: qty 5

## 2012-03-09 MED ORDER — FENTANYL CITRATE 0.05 MG/ML IJ SOLN
INTRAMUSCULAR | Status: DC | PRN
  Administered 2012-03-09 (×3): 100 ug via INTRAVENOUS

## 2012-03-09 MED ORDER — DOCUSATE SODIUM 100 MG PO CAPS
100.0000 mg | ORAL_CAPSULE | Freq: Two times a day (BID) | ORAL | Status: DC
  Administered 2012-03-09 – 2012-03-12 (×7): 100 mg via ORAL
  Filled 2012-03-09 (×8): qty 1

## 2012-03-09 MED ORDER — FUROSEMIDE 20 MG PO TABS
20.0000 mg | ORAL_TABLET | Freq: Every day | ORAL | Status: DC | PRN
  Filled 2012-03-09: qty 1

## 2012-03-09 MED ORDER — HYDROMORPHONE 0.3 MG/ML IV SOLN
INTRAVENOUS | Status: DC
  Administered 2012-03-09: 25 mL via INTRAVENOUS
  Administered 2012-03-10: 0.2 mg via INTRAVENOUS
  Administered 2012-03-10: 0.799 mL via INTRAVENOUS
  Administered 2012-03-10: 0.399 mg via INTRAVENOUS
  Administered 2012-03-10: 0.4 mL via INTRAVENOUS
  Administered 2012-03-11: 0.399 mg via INTRAVENOUS

## 2012-03-09 MED ORDER — ONDANSETRON HCL 4 MG/2ML IJ SOLN
4.0000 mg | Freq: Four times a day (QID) | INTRAMUSCULAR | Status: DC | PRN
  Administered 2012-03-09: 4 mg via INTRAVENOUS
  Filled 2012-03-09: qty 2

## 2012-03-09 MED ORDER — ROCURONIUM BROMIDE 100 MG/10ML IV SOLN
INTRAVENOUS | Status: DC | PRN
  Administered 2012-03-09: 50 mg via INTRAVENOUS

## 2012-03-09 MED ORDER — TIMOLOL MALEATE 0.5 % OP SOLN
1.0000 [drp] | Freq: Two times a day (BID) | OPHTHALMIC | Status: DC
  Filled 2012-03-09: qty 5

## 2012-03-09 MED ORDER — PHENOL 1.4 % MT LIQD
1.0000 | OROMUCOSAL | Status: DC | PRN
  Filled 2012-03-09: qty 177

## 2012-03-09 MED ORDER — METOCLOPRAMIDE HCL 5 MG/ML IJ SOLN: 5.0000 mg | Freq: Three times a day (TID) | INTRAMUSCULAR | Status: DC | PRN

## 2012-03-09 MED ORDER — ALBUMIN HUMAN 5 % IV SOLN
INTRAVENOUS | Status: DC | PRN
  Administered 2012-03-09: 11:00:00 via INTRAVENOUS

## 2012-03-09 MED ORDER — COUMADIN BOOK
Freq: Once | Status: DC
  Filled 2012-03-09: qty 1

## 2012-03-09 MED ORDER — WARFARIN - PHARMACIST DOSING INPATIENT: Freq: Every day | Status: DC

## 2012-03-09 MED ORDER — GLYCOPYRROLATE 0.2 MG/ML IJ SOLN
INTRAMUSCULAR | Status: DC | PRN
  Administered 2012-03-09: 0.6 mg via INTRAVENOUS

## 2012-03-09 MED ORDER — AMLODIPINE BESYLATE 10 MG PO TABS
10.0000 mg | ORAL_TABLET | Freq: Every day | ORAL | Status: DC
  Administered 2012-03-10 – 2012-03-12 (×3): 10 mg via ORAL
  Filled 2012-03-09 (×3): qty 1

## 2012-03-09 MED ORDER — TIMOLOL MALEATE 0.5 % OP SOLN
1.0000 [drp] | Freq: Two times a day (BID) | OPHTHALMIC | Status: DC
  Administered 2012-03-10 – 2012-03-12 (×5): 1 [drp] via OPHTHALMIC
  Filled 2012-03-09: qty 5

## 2012-03-09 MED ORDER — NEOSTIGMINE METHYLSULFATE 1 MG/ML IJ SOLN
INTRAMUSCULAR | Status: DC | PRN
  Administered 2012-03-09: 4 mg via INTRAVENOUS

## 2012-03-09 MED ORDER — WARFARIN VIDEO: Freq: Once | Status: DC

## 2012-03-09 MED ORDER — BRINZOLAMIDE 1 % OP SUSP
1.0000 [drp] | Freq: Three times a day (TID) | OPHTHALMIC | Status: DC
  Administered 2012-03-09 – 2012-03-12 (×8): 1 [drp] via OPHTHALMIC
  Filled 2012-03-09 (×2): qty 10

## 2012-03-09 MED ORDER — ACETAMINOPHEN 10 MG/ML IV SOLN
INTRAVENOUS | Status: DC | PRN
  Administered 2012-03-09: 1000 mg via INTRAVENOUS

## 2012-03-09 MED ORDER — ONDANSETRON HCL 4 MG/2ML IJ SOLN: 4.0000 mg | Freq: Four times a day (QID) | INTRAMUSCULAR | Status: DC | PRN

## 2012-03-09 MED ORDER — DIPHENHYDRAMINE HCL 12.5 MG/5ML PO ELIX: 12.5000 mg | ORAL_SOLUTION | Freq: Four times a day (QID) | ORAL | Status: DC | PRN

## 2012-03-09 MED ORDER — ONDANSETRON HCL 4 MG PO TABS: 4.0000 mg | ORAL_TABLET | Freq: Four times a day (QID) | ORAL | Status: DC | PRN

## 2012-03-09 MED ORDER — DIPHENHYDRAMINE HCL 50 MG/ML IJ SOLN: 12.5000 mg | Freq: Four times a day (QID) | INTRAMUSCULAR | Status: DC | PRN

## 2012-03-09 MED ORDER — ACETAMINOPHEN 500 MG PO TABS
1000.0000 mg | ORAL_TABLET | Freq: Four times a day (QID) | ORAL | Status: AC
  Administered 2012-03-09 – 2012-03-10 (×4): 1000 mg via ORAL
  Filled 2012-03-09 (×4): qty 2

## 2012-03-09 MED ORDER — LABETALOL HCL 100 MG PO TABS
100.0000 mg | ORAL_TABLET | Freq: Two times a day (BID) | ORAL | Status: DC
  Administered 2012-03-09 – 2012-03-12 (×6): 100 mg via ORAL
  Filled 2012-03-09 (×7): qty 1

## 2012-03-09 MED ORDER — ACETAMINOPHEN 10 MG/ML IV SOLN
INTRAVENOUS | Status: AC
  Filled 2012-03-09: qty 100

## 2012-03-09 MED ORDER — OXYCODONE HCL 5 MG PO TABS
5.0000 mg | ORAL_TABLET | ORAL | Status: DC | PRN
  Administered 2012-03-11 (×2): 10 mg via ORAL
  Administered 2012-03-11: 5 mg via ORAL
  Administered 2012-03-12 (×2): 10 mg via ORAL
  Filled 2012-03-09 (×4): qty 2
  Filled 2012-03-09: qty 1

## 2012-03-09 MED ORDER — SODIUM CHLORIDE 0.9 % IR SOLN
Status: DC | PRN
  Administered 2012-03-09 (×2): 1000 mL

## 2012-03-09 MED ORDER — BRIMONIDINE TARTRATE-TIMOLOL 0.2-0.5 % OP SOLN: 1.0000 [drp] | Freq: Two times a day (BID) | OPHTHALMIC | Status: DC

## 2012-03-09 MED ORDER — MENTHOL 3 MG MT LOZG: 1.0000 | LOZENGE | OROMUCOSAL | Status: DC | PRN

## 2012-03-09 MED ORDER — NALOXONE HCL 0.4 MG/ML IJ SOLN: 0.4000 mg | INTRAMUSCULAR | Status: DC | PRN

## 2012-03-09 SURGICAL SUPPLY — 56 items
BIT DRILL RINGLOC 3.2MMX20 (BIT) IMPLANT
BIT DRILL RINGLOC 3.2X20 (BIT) ×1
BLADE SAW SAG 73X25 THK (BLADE) ×1
BLADE SAW SGTL 73X25 THK (BLADE) ×1 IMPLANT
BRUSH FEMORAL CANAL (MISCELLANEOUS) IMPLANT
CLOTH BEACON ORANGE TIMEOUT ST (SAFETY) ×2 IMPLANT
COVER SURGICAL LIGHT HANDLE (MISCELLANEOUS) ×2 IMPLANT
DRAPE INCISE IOBAN 66X45 STRL (DRAPES) ×1 IMPLANT
DRAPE ORTHO SPLIT 77X108 STRL (DRAPES) ×4
DRAPE SURG ORHT 6 SPLT 77X108 (DRAPES) ×2 IMPLANT
DRAPE U-SHAPE 47X51 STRL (DRAPES) ×1 IMPLANT
DRILL BIT RINGLOC 3.2MMX20 (BIT) ×2
DRSG MEPILEX BORDER 4X12 (GAUZE/BANDAGES/DRESSINGS) ×1 IMPLANT
DRSG MEPILEX BORDER 4X8 (GAUZE/BANDAGES/DRESSINGS) IMPLANT
DURAPREP 26ML APPLICATOR (WOUND CARE) ×2 IMPLANT
ELECT BLADE 6.5 EXT (BLADE) ×1 IMPLANT
ELECT CAUTERY BLADE 6.4 (BLADE) ×2 IMPLANT
ELECT REM PT RETURN 9FT ADLT (ELECTROSURGICAL) ×2
ELECTRODE REM PT RTRN 9FT ADLT (ELECTROSURGICAL) ×1 IMPLANT
EVACUATOR 3/16  PVC DRAIN (DRAIN)
EVACUATOR 3/16 PVC DRAIN (DRAIN) IMPLANT
FACESHIELD LNG OPTICON STERILE (SAFETY) ×2 IMPLANT
GLOVE BIO SURGEON STRL SZ7.5 (GLOVE) ×2 IMPLANT
GLOVE BIO SURGEON STRL SZ8 (GLOVE) ×1 IMPLANT
GLOVE ECLIPSE 7.5 STRL STRAW (GLOVE) ×2 IMPLANT
GLOVE SS PI 9.0 STRL (GLOVE) ×2 IMPLANT
GLOVE SURG SS PI 7.5 STRL IVOR (GLOVE) ×4 IMPLANT
GOWN PREVENTION PLUS XLARGE (GOWN DISPOSABLE) ×2 IMPLANT
GOWN STRL NON-REIN LRG LVL3 (GOWN DISPOSABLE) ×4 IMPLANT
HANDPIECE INTERPULSE COAX TIP (DISPOSABLE)
KIT BASIN OR (CUSTOM PROCEDURE TRAY) ×2 IMPLANT
KIT ROOM TURNOVER OR (KITS) ×2 IMPLANT
MANIFOLD NEPTUNE II (INSTRUMENTS) ×2 IMPLANT
NS IRRIG 1000ML POUR BTL (IV SOLUTION) ×2 IMPLANT
PACK TOTAL JOINT (CUSTOM PROCEDURE TRAY) ×2 IMPLANT
PAD ARMBOARD 7.5X6 YLW CONV (MISCELLANEOUS) ×4 IMPLANT
PILLOW ABDUCTION HIP (SOFTGOODS) ×1 IMPLANT
PRESSURIZER FEMORAL UNIV (MISCELLANEOUS) IMPLANT
SCREW 6.5X20MM LP BONE (Screw) ×1 IMPLANT
SCREW BIOMET (Screw) ×2 IMPLANT
SET HNDPC FAN SPRY TIP SCT (DISPOSABLE) IMPLANT
SLEEVE SURGEON STRL (DRAPES) ×1 IMPLANT
SPONGE LAP 18X18 X RAY DECT (DISPOSABLE) ×2 IMPLANT
STAPLER VISISTAT 35W (STAPLE) ×2 IMPLANT
SUCTION FRAZIER TIP 10 FR DISP (SUCTIONS) ×2 IMPLANT
SUT VIC AB 0 CT1 27 (SUTURE) ×2
SUT VIC AB 0 CT1 27XBRD ANBCTR (SUTURE) IMPLANT
SUT VIC AB 0 CTB1 27 (SUTURE) ×6 IMPLANT
SUT VIC AB 1 CT1 27 (SUTURE) ×4
SUT VIC AB 1 CT1 27XBRD ANBCTR (SUTURE) ×2 IMPLANT
SUT VIC AB 2-0 CTB1 (SUTURE) ×4 IMPLANT
TOWEL OR 17X24 6PK STRL BLUE (TOWEL DISPOSABLE) ×2 IMPLANT
TOWEL OR 17X26 10 PK STRL BLUE (TOWEL DISPOSABLE) ×2 IMPLANT
TOWER CARTRIDGE SMART MIX (DISPOSABLE) IMPLANT
TRAY FOLEY CATH 14FR (SET/KITS/TRAYS/PACK) IMPLANT
WATER STERILE IRR 1000ML POUR (IV SOLUTION) ×8 IMPLANT

## 2012-03-09 NOTE — Anesthesia Postprocedure Evaluation (Signed)
  Anesthesia Post-op Note  Patient: Brenda Kerr  Procedure(s) Performed: Procedure(s) (LRB): TOTAL HIP ARTHROPLASTY (Left)  Patient Location: PACU  Anesthesia Type: General  Level of Consciousness: awake, alert  and oriented  Airway and Oxygen Therapy: Patient Spontanous Breathing and Patient connected to nasal cannula oxygen  Post-op Pain: mild  Post-op Assessment: Post-op Vital signs reviewed  Post-op Vital Signs: Reviewed  Complications: No apparent anesthesia complications

## 2012-03-09 NOTE — Progress Notes (Signed)
ANTICOAGULATION CONSULT NOTE - Initial Consult  Pharmacy Consult for Warfarin Indication: VTE prophylaxis  Allergies  Allergen Reactions  . Codeine Nausea And Vomiting     Vital Signs: Temp: 97.5 F (36.4 C) (07/10 1245) Temp src: Oral (07/10 0720) BP: 124/58 mmHg (07/10 1238) Pulse Rate: 61  (07/10 1245)  Labs: No results found for this basename: HGB:2,HCT:3,PLT:3,APTT:3,LABPROT:3,INR:3,HEPARINUNFRC:3,CREATININE:3,CKTOTAL:3,CKMB:3,TROPONINI:3 in the last 72 hours  The CrCl is unknown because both a height and weight (above a minimum accepted value) are required for this calculation.   Medical History: Past Medical History  Diagnosis Date  . Hypertension   . Glaucoma   . Dysrhythmia   . Shortness of breath     walk a long way  . Peripheral vascular disease   . GERD (gastroesophageal reflux disease)   . Arthritis     Medications:  Prescriptions prior to admission  Medication Sig Dispense Refill  . amLODipine (NORVASC) 10 MG tablet Take 10 mg by mouth daily.        Marland Kitchen aspirin 81 MG tablet Take 81 mg by mouth daily.        . bimatoprost (LUMIGAN) 0.01 % SOLN Place 1 drop into both eyes at bedtime.        . brimonidine-timolol (COMBIGAN) 0.2-0.5 % ophthalmic solution Place 1 drop into both eyes every 12 (twelve) hours.        . brinzolamide (AZOPT) 1 % ophthalmic suspension Place 1 drop into both eyes 3 (three) times daily.        . furosemide (LASIX) 20 MG tablet Take 20 mg by mouth daily as needed. For fluid      . ibuprofen (ADVIL,MOTRIN) 200 MG tablet Take 400 mg by mouth every 6 (six) hours as needed. Pain       . labetalol (NORMODYNE) 100 MG tablet Take 100 mg by mouth 2 (two) times daily.          Assessment: Pt is a 71 yo F with a history of bilateral djd who underwent left THA on 7/10. Baseline INR 1.02 on 7/5. Hgb 14.6 on 7/5. Will initiate warfarin for VTE prophylaxis.   Goal of Therapy:  INR 2-3 Monitor platelets by anticoagulation protocol: Yes   Plan:   1. Initiate warfarin 5mg  x 1 2. Daily PT/INR 3. Begin warfarin education process  Abran Duke 03/09/2012,2:08 PM

## 2012-03-09 NOTE — Progress Notes (Signed)
UR COMPLETED  

## 2012-03-09 NOTE — H&P (Signed)
Brenda Kerr, Brenda Kerr               ACCOUNT NO.:  [id number]  MEDICAL RECORD NO.:  [id number]  LOCATION:  [facility]                         FACILITY:  [facility]  PHYSICIAN:  Myrtie Neither, MD      DATE OF BIRTH:  February 10, 1941  DATE OF ADMISSION:  03/09/2012 DATE OF DISCHARGE:                             HISTORY & PHYSICAL   CHIEF COMPLAINT:  Painful left hip.  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  The patient underwent right total hip arthroplasty over a year ago, did quite well, but her left hip has progressively worsened with increased persistent pain both with ambulation as well as at rest, difficulty with her walks.  Increased loss of function.  The patient has been treated with anti-inflammatories and pain medication.  PAST MEDICAL HISTORY: 1. Hypertension. 2. Glaucoma. 3. Dysrhythmia. 4. Shortness of breath on ambulation. 5. Peripheral vascular disease. 6. GERD. 7. Degenerative joint disease.  SURGERY:  The patient had eye surgery and right total hip arthroplasty.  ALLERGIES:  CODEINE.  SOCIAL HISTORY:  No history of use of alcohol, tobacco, or illegal drugs.  FAMILY HISTORY:  High blood pressure.  REVIEW OF SYSTEMS:  No cardiac, respiratory, no urinary or bowel symptoms.  MEDICATIONS: 1. Norvasc 10 mg. 2. Aspirin 81 mg. 3. Lumigan 0.1% eye solution. 4. Combigan 0.1 to 0.5% solution. 5. Azopt eye solution. 6. Lasix 20 mg daily. 7. Advil 200 mg p.r.n. 8. Normodyne 100 mg daily. 9. Lodine 400 mg b.i.d. 10.Losartan 1 q.8 p.r.n. for pain.  PHYSICAL EXAMINATION:  VITAL SIGNS:  Temperature 98.3, pulse 71, blood pressure 130/75, respirations 18, O2 saturation 96%, height 5 feet 2 inches, weight 103.42 kg. HEAD:  Normocephalic. EYES:  Conjunctivae clear. NECK:  Supple. CHEST:  Clear. CARDIAC:  S1 and S2.  Regular. EXTREMITIES:  The  patient ambulates with an antalgic gait, worse on the left side.  Left hip limited range of motion with limited rotation as well as hip extension.  The patient has pain on attempted rotation as well as resisted hip extension.  X-ray revealed sclerosis, loss of joint space in the left hip joint.  IMPRESSION:  Degenerative joint disease, left hip.  PLAN:  Left total hip arthroplasty.     Myrtie Neither, MD     AC/MEDQ  D:  03/09/2012  T:  03/09/2012  Job:  [id number]

## 2012-03-09 NOTE — H&P (Signed)
Brenda Kerr is an 71 y.o. female.   Chief Complaint: PAINFUL LEFT HIP HPI: THIS IS A 70 Y/O FEMALE WITH HISTORY OF BILATERAL DJD OF HER HIPS AND RIGHT THA ONE YEAR AGO AND RETURNS FOR PROGRESSIVE WORSENING OF PAIN ,LOSS OF MOTION AND INCREASED DYSFUNCTION DUE TO RIGHT HIP PAIN.  Past Medical History  Diagnosis Date  . Hypertension   . Glaucoma   . Dysrhythmia   . Shortness of breath     walk a long way  . Peripheral vascular disease   . GERD (gastroesophageal reflux disease)   . Arthritis     Past Surgical History  Procedure Date  . Eye surgery   . Joint replacement     hip replacement    History reviewed. No pertinent family history. Social History:  reports that she has never smoked. She does not have any smokeless tobacco history on file. She reports that she does not drink alcohol or use illicit drugs.  Allergies:  Allergies  Allergen Reactions  . Codeine Nausea And Vomiting    Medications Prior to Admission  Medication Sig Dispense Refill  . amLODipine (NORVASC) 10 MG tablet Take 10 mg by mouth daily.        Marland Kitchen aspirin 81 MG tablet Take 81 mg by mouth daily.        . bimatoprost (LUMIGAN) 0.01 % SOLN Place 1 drop into both eyes at bedtime.        . brimonidine-timolol (COMBIGAN) 0.2-0.5 % ophthalmic solution Place 1 drop into both eyes every 12 (twelve) hours.        . brinzolamide (AZOPT) 1 % ophthalmic suspension Place 1 drop into both eyes 3 (three) times daily.        . furosemide (LASIX) 20 MG tablet Take 20 mg by mouth daily as needed. For fluid      . ibuprofen (ADVIL,MOTRIN) 200 MG tablet Take 400 mg by mouth every 6 (six) hours as needed. Pain       . labetalol (NORMODYNE) 100 MG tablet Take 100 mg by mouth 2 (two) times daily.          No results found for this or any previous visit (from the past 48 hour(s)). No results found.  Review of Systems  Constitutional: Negative.   HENT: Negative.   Eyes: Negative.   Respiratory: Negative.     Cardiovascular: Negative.   Gastrointestinal: Negative.   Genitourinary: Negative.   Musculoskeletal: Positive for joint pain.  Skin: Negative.   Neurological: Negative.   Endo/Heme/Allergies: Negative.   Psychiatric/Behavioral: Negative.     Blood pressure 130/75, pulse 71, temperature 98.3 F (36.8 C), temperature source Oral, resp. rate 18, SpO2 96.00%. Physical Exam LEFT HIP WITH LIMITED ROM, WITH PAIN ON ATTEPTEDED ROTATION AND HIP EXSTENTION, ANTALGIC GAIT.X-R REVEALS LOSS OF JOINT SPACE OF THE LEFT HIP WITH SCLEROSIS.  Assessment/Plan DJD LEFT HIP/PLAN LEFT THA  Brenda Kerr F 03/09/2012, 8:36 AM

## 2012-03-09 NOTE — Op Note (Signed)
Brenda Kerr, Brenda Kerr               ACCOUNT NO.:  [id number]  MEDICAL RECORD NO.:  [id number]  LOCATION:  [facility]                         FACILITY:  [facility]  PHYSICIAN:  Myrtie Neither, MD      DATE OF BIRTH:  December 19, 1940  DATE OF PROCEDURE:  03/09/2012 DATE OF DISCHARGE:                              OPERATIVE REPORT   PREOPERATIVE DIAGNOSIS:  Degenerative joint disease, left hip.  POSTOPERATIVE DIAGNOSIS:  Degenerative joint disease, left hip.  ANESTHESIA:  General.  PROCEDURE:  Left total hip arthroplasty, Biomet implant.  DESCRIPTION OF PROCEDURE:  The patient was taken to the operating room. After given adequate preop medications, given general anesthesia, and intubated.  The patient was placed in left lateral position.  Left hip was prepped with DuraPrep and draped in a sterile manner Bovie was used for hemostasis.  Posterior southern approach was made over the left hip going through skin and subcutaneous tissue down to the fascia.  Hip was internally rotated.  Short rotators were reduced.  Capsule was exposed and incised inverted T-fashion and resected.  Femoral head was then dislocated.  Femoral neck cutting jig was put in place and appropriate femoral neck cut was made.  Acetabular reaming was initially started with a 50-mm reamer and reaming was done up to the 56 mm down to good bleeding surface.  Soft tissue resection about the acetabulum was also further done.  Trial of 56 mm was put in the acetabulum, was found to seat very well.  A 56-mm limited porous-coated shell was then put in place and one acetabular screw to add further stability to it.  A 10 mm trial liner was put in place.  Next, attention was turned to the femoral neck.  Further resection of the femoral neck was done followed by reaming down the femoral canal starting with a cookie cutter, proximal reamer and distal reaming.  Rasping was done up to 10 mm.  A standard neck -3 head and neck component was put in place  trial and the joint was found to be too tight.  Next we went to a -6 mm, which the femoral head locked it very well.  Range of motion was tested, very stable with almost perfect alignment.  Trial components were then removed including the acetabular liner.  Second acetabular screw was put in place, followed by placement of the acetabular liner 10 degree, and femoral stem component 10 mm was hammered in place down the femoral canal. Again range of motion was tested with the -6 head and neck component and again full flexion, good extension, good leg length, and very stable. Femoral head component was then put in place and hip was reduced. Copious and abundant irrigation was done and wound closure was done with 1-0 with 0 Vicryl for the fascia, 2-0 for the subcutaneous, and skin staples for the skin.  Trial components was that of a 56 mm size 24 porous-coated acetabular shell with 2 acetabular screws, modular head -6 mm and 10-degree acetabular liner, 36 mm 10 degree, and the stem was 10 mm x 140 mm porous-coated stem.  After wound closure, the patient was placed in hip abduction pillow, tolerated the procedure quite  well, went to recovery room in stable and satisfactory condition.     Myrtie Neither, MD     AC/MEDQ  D:  03/09/2012  T:  03/09/2012  Job:  [id number]

## 2012-03-09 NOTE — Preoperative (Signed)
Beta Blockers   Reason not to administer Beta Blockers:Not Applicable 

## 2012-03-09 NOTE — Transfer of Care (Signed)
Immediate Anesthesia Transfer of Care Note  Patient: Brenda Kerr  Procedure(s) Performed: Procedure(s) (LRB): TOTAL HIP ARTHROPLASTY (Left)  Patient Location: PACU  Anesthesia Type: General  Level of Consciousness: awake, alert , oriented and patient cooperative  Airway & Oxygen Therapy: Patient Spontanous Breathing and Patient connected to face mask oxygen  Post-op Assessment: Report given to PACU RN, Post -op Vital signs reviewed and stable and Patient moving all extremities  Post vital signs: Reviewed and stable  Complications: No apparent anesthesia complications

## 2012-03-09 NOTE — Anesthesia Preprocedure Evaluation (Addendum)
Anesthesia Evaluation  Patient identified by MRN, date of birth, ID band Patient awake    Reviewed: Allergy & Precautions, H&P , NPO status , Patient's Chart, lab work & pertinent test results, reviewed documented beta blocker date and time   History of Anesthesia Complications Negative for: history of anesthetic complications  Airway Mallampati: I TM Distance: >3 FB     Dental  (+) Edentulous Upper, Edentulous Lower and Dental Advisory Given   Pulmonary shortness of breath and with exertion,  breath sounds clear to auscultation        Cardiovascular Exercise Tolerance: Good hypertension, Pt. on medications and Pt. on home beta blockers + dysrhythmias Rhythm:Regular Rate:Normal     Neuro/Psych negative neurological ROS  negative psych ROS   GI/Hepatic GERD-  Medicated and Controlled,  Endo/Other  Morbid obesity  Renal/GU   negative genitourinary   Musculoskeletal  (+) Arthritis -, Osteoarthritis,    Abdominal   Peds negative pediatric ROS (+)  Hematology   Anesthesia Other Findings   Reproductive/Obstetrics                          Anesthesia Physical Anesthesia Plan  ASA: III  Anesthesia Plan: General   Post-op Pain Management:    Induction: Intravenous  Airway Management Planned: Oral ETT  Additional Equipment:   Intra-op Plan:   Post-operative Plan: Extubation in OR  Informed Consent: I have reviewed the patients History and Physical, chart, labs and discussed the procedure including the risks, benefits and alternatives for the proposed anesthesia with the patient or authorized representative who has indicated his/her understanding and acceptance.   Dental advisory given  Plan Discussed with: Anesthesiologist, CRNA and Surgeon  Anesthesia Plan Comments:         Anesthesia Quick Evaluation

## 2012-03-09 NOTE — Brief Op Note (Signed)
03/09/2012  11:49 AM  PATIENT:  Brenda Kerr  71 y.o. female  PRE-OPERATIVE DIAGNOSIS:  Degenerative Joint Disease LEFT HIP  POST-OPERATIVE DIAGNOSIS:  Degenerative Joint Disease LEFT HIP  PROCEDURE:  Procedure(s) (LRB): TOTAL HIP ARTHROPLASTY (Left)  SURGEON:  Surgeon(s) and Role:    * Kennieth Rad, MD - Primary  PHYSICIAN ASSISTANT:   ASSISTANTS: none   ANESTHESIA:   general  EBL:  Total I/O In: 1250 [I.V.:1000; IV Piggyback:250] Out: 1000 [Urine:450; Blood:550]  BLOOD ADMINISTERED:none  DRAINS: none   LOCAL MEDICATIONS USED:  NONE  SPECIMEN:  No Specimen  DISPOSITION OF SPECIMEN:  N/A  COUNTS:  YES  TOURNIQUET:  * No tourniquets in log *  DICTATION: .Other Dictation: Dictation Number report #[id number]  PLAN OF CARE: Admit to inpatient   PATIENT DISPOSITION:  PACU - hemodynamically stable.   Delay start of Pharmacological VTE agent (>24hrs) due to surgical blood loss or risk of bleeding: yes

## 2012-03-10 ENCOUNTER — Encounter (HOSPITAL_COMMUNITY): Payer: Self-pay | Admitting: General Practice

## 2012-03-10 LAB — PROTIME-INR
INR: 1.15 (ref 0.00–1.49)
Prothrombin Time: 14.9 seconds (ref 11.6–15.2)

## 2012-03-10 LAB — CBC
Hemoglobin: 10.4 g/dL — ABNORMAL LOW (ref 12.0–15.0)
MCH: 28.6 pg (ref 26.0–34.0)
MCV: 86.3 fL (ref 78.0–100.0)
Platelets: 178 10*3/uL (ref 150–400)
RBC: 3.64 MIL/uL — ABNORMAL LOW (ref 3.87–5.11)

## 2012-03-10 MED ORDER — WARFARIN SODIUM 5 MG PO TABS
5.0000 mg | ORAL_TABLET | Freq: Once | ORAL | Status: AC
  Administered 2012-03-10: 5 mg via ORAL
  Filled 2012-03-10: qty 1

## 2012-03-10 NOTE — Progress Notes (Signed)
Subjective: 1 Day Post-Op Procedure(s) (LRB): TOTAL HIP ARTHROPLASTY (Left) Patient reports pain as 4 on 0-10 scale.    Objective: Vital signs in last 24 hours: Temp:  [97.5 F (36.4 C)-98.1 F (36.7 C)] 98.1 F (36.7 C) (07/11 1002) Pulse Rate:  [61-74] 72  (07/11 1002) Resp:  [12-19] 12  (07/11 1002) BP: (122-155)/(55-99) 155/63 mmHg (07/11 1002) SpO2:  [95 %-100 %] 100 % (07/11 1002)  Intake/Output from previous day: 07/10 0701 - 07/11 0700 In: 2150 [I.V.:1900; IV Piggyback:250] Out: 2300 [Urine:1750; Blood:550] Intake/Output this shift: Total I/O In: 240 [P.O.:240] Out: -    Basename 03/10/12 0614  HGB 10.4*    Basename 03/10/12 0614  WBC 9.0  RBC 3.64*  HCT 31.4*  PLT 178   No results found for this basename: NA:2,K:2,CL:2,CO2:2,BUN:2,CREATININE:2,GLUCOSE:2,CALCIUM:2 in the last 72 hours  Basename 03/10/12 0614  LABPT --  INR 1.15    Neurologically intact  Assessment/Plan: 1 Day Post-Op Procedure(s) (LRB): TOTAL HIP ARTHROPLASTY (Left) Up with therapy  Cynthea Zachman F 03/10/2012, 12:38 PM

## 2012-03-10 NOTE — Progress Notes (Signed)
PT Progress Note  Past Surgical History  Procedure Date  . Eye surgery   . Joint replacement     hip replacement  . Arthroplasty 03/09/2012    lt hip   Past Medical History  Diagnosis Date  . Hypertension   . Glaucoma   . Dysrhythmia   . Shortness of breath     walk a long way  . Peripheral vascular disease   . GERD (gastroesophageal reflux disease)   . Arthritis   . H/O hiatal hernia     03/10/12 1500  PT Visit Information  Last PT Received On 03/10/12  Assistance Needed +1  PT Time Calculation  PT Start Time 1320  PT Stop Time 1337  PT Time Calculation (min) 17 min  Precautions  Precautions Posterior Hip  Precaution Booklet Issued Yes (comment)  Precaution Comments pt able to recall 3/3 posterior hip precautions as well as PWB  Restrictions  Weight Bearing Restrictions Yes  LLE Weight Bearing PWB  LLE Partial Weight Bearing Percentage or Pounds 50%  Cognition  Overall Cognitive Status Appears within functional limits for tasks assessed/performed  Arousal/Alertness Awake/alert  Orientation Level Appears intact for tasks assessed  Behavior During Session [facility] for tasks performed  Exercises  Exercises Total Joint  Total Joint Exercises  Ankle Circles/Pumps AROM;10 reps;Both  Quad Sets AROM;Strengthening;Both;10 reps  Heel Slides AAROM;Left;10 reps;Seated  Short Arc Quad Strengthening;Left;10 reps;Supine  Hip ABduction/ADduction Strengthening;Left;10 reps;Supine  PT - End of Session  Activity Tolerance Patient tolerated treatment well  Patient left in bed;with call bell/phone within reach  Nurse Communication Mobility status  PT - Assessment/Plan  Comments on Treatment Session Pt just returned to bed after walking to restroom but agreeable to perform HEP.  Will continue to follow pt.    PT Plan Discharge plan remains appropriate;Frequency remains appropriate  PT Frequency 7X/week  Follow Up Recommendations Home health PT;Supervision/Assistance - 24 hour    Equipment Recommended None recommended by PT  Acute Rehab PT Goals  PT Goal Formulation With patient  Time For Goal Achievement 03/17/12  Potential to Achieve Goals Good  Pt will Perform Home Exercise Program with supervision, verbal cues required/provided  PT Goal: Perform Home Exercise Program - Progress Goal set today  Additional Goals  Additional Goal #1 Pt able to recall and adhere to 3/3 posterior hip precuations.  PT Goal: Additional Goal #1 - Progress Not met  PT General Charges  $$ ACUTE PT VISIT 1 Procedure  PT Treatments  $Therapeutic Exercise 8-22 mins     [place], [place] DPT [phone]

## 2012-03-10 NOTE — Evaluation (Signed)
Occupational Therapy Evaluation Patient Details Name: Brenda Kerr MRN: [id number] DOB: [birth year] Today's Date: 03/10/2012 Time: 4098-1191 OT Time Calculation (min): 37 min  OT Assessment / Plan / Recommendation Clinical Impression  Pt s/p Lt THA and presents with decrease I with ADL. Pt will benefit from skilled OT in the acute setting to maximize I with ADL and ADL mobility prior to d/c    OT Assessment  Patient needs continued OT Services    Follow Up Recommendations  No OT follow up;Supervision/Assistance - 24 hour    Barriers to Discharge      Equipment Recommendations  None recommended by PT;None recommended by OT    Recommendations for Other Services    Frequency  Min 2X/week    Precautions / Restrictions Precautions Precautions: Posterior Hip Precaution Comments: pt able to recall 3/3 posterior hip precautions as well as PWB Restrictions LLE Weight Bearing: Partial weight bearing LLE Partial Weight Bearing Percentage or Pounds: 50%   Pertinent Vitals/Pain Pt denies any pain at this time; states she was pre-medicated    ADL  Grooming: Performed;Set up;Min guard Where Assessed - Grooming: Supported standing (leans against sink) Upper Body Bathing: Performed;Min guard Where Assessed - Upper Body Bathing: Unsupported standing Lower Body Bathing: Performed;Min guard Where Assessed - Lower Body Bathing: Unsupported sit to stand Upper Body Dressing: Performed;Minimal assistance Where Assessed - Upper Body Dressing: Unsupported sitting Lower Body Dressing: Simulated;Maximal assistance Where Assessed - Lower Body Dressing: Unsupported standing Toilet Transfer: Performed;Min guard Toilet Transfer Method: Sit to Barista: Bedside commode Toileting - Clothing Manipulation and Hygiene: Performed;Minimal assistance Where Assessed - Engineer, mining and Hygiene: Standing Equipment Used: Gait belt;Rolling walker Transfers/Ambulation  Related to ADLs: Min Guard A with RW ambulation throughout room; VC for upright standing and to stay within RW ADL Comments: Pt doing well. Dtr (who pt will be staying with) in room and educated on post hip and PWB status precautions.     OT Diagnosis: Generalized weakness;Acute pain  OT Problem List: Decreased activity tolerance;Decreased knowledge of use of DME or AE;Decreased knowledge of precautions;Pain OT Treatment Interventions: Self-care/ADL training;DME and/or AE instruction;Patient/family education;Balance training   OT Goals Acute Rehab OT Goals OT Goal Formulation: With patient Time For Goal Achievement: 03/17/12 Potential to Achieve Goals: Good ADL Goals Pt Will Perform Lower Body Dressing: with min assist;Sit to stand from bed;Sit to stand from chair (AE prn) ADL Goal: Lower Body Dressing - Progress: Goal set today Pt Will Perform Tub/Shower Transfer: Shower transfer;Ambulation;with DME (back in) ADL Goal: Tub/Shower Transfer - Progress: Goal set today Additional ADL Goal #1: Pt will verbalize/generalize 3/3 posterior hip precautions and PWB status LLE for use with functional activities ADL Goal: Additional Goal #1 - Progress: Goal set today  Visit Information  Last OT Received On: 03/10/12 Assistance Needed: +1    Subjective Data  Subjective: I need to get this stink off of me Patient Stated Goal: Return home    Prior Functioning  Home Living Lives With: Spouse Available Help at Discharge: Other (Comment) Type of Home: House Home Access: Level entry Home Layout: One level Bathroom Shower/Tub: Tub/shower unit;Curtain Firefighter: Standard Bathroom Accessibility: Yes How Accessible: Accessible via walker Home Adaptive Equipment: Walker - rolling;Straight cane Prior Function Level of Independence: Independent with assistive device(s) Able to Take Stairs?: Yes Driving: No Vocation: Retired Musician: No difficulties Dominant Hand: Right      Cognition  Overall Cognitive Status: Appears within functional limits for tasks assessed/performed Arousal/Alertness:  Awake/alert Orientation Level: Appears intact for tasks assessed Behavior During Session: [facility] for tasks performed    Extremity/Trunk Assessment Right Upper Extremity Assessment RUE ROM/Strength/Tone: [facility] for tasks assessed RUE Sensation: WFL - Light Touch RUE Coordination: WFL - gross/fine motor Left Upper Extremity Assessment LUE ROM/Strength/Tone: WFL for tasks assessed LUE Sensation: WFL - Light Touch LUE Coordination: WFL - gross/fine motor   Mobility Bed Mobility Bed Mobility: Sit to Supine Supine to Sit: 4: Min guard;HOB flat;With rails Sit to Supine: 4: Min assist;HOB flat Details for Bed Mobility Assistance: A to bring LLE into bed.  Transfers Sit to Stand: 4: Min guard;From toilet;From bed Stand to Sit: 4: Min guard;With armrests;To chair/3-in-1;To bed Details for Transfer Assistance: VC for hand placement   Exercise    Balance    End of Session OT - End of Session Equipment Utilized During Treatment: Gait belt Activity Tolerance: Patient tolerated treatment well Patient left: in bed;with call bell/phone within reach;with family/visitor present Nurse Communication: Mobility status  GO     [name] 03/10/2012, 3:32 PM

## 2012-03-10 NOTE — Progress Notes (Signed)
ANTICOAGULATION CONSULT NOTE - Follow Up Consult  Pharmacy Consult for Warfarin Indication: VTE prophylaxis  Allergies  Allergen Reactions  . Codeine Nausea And Vomiting     Vital Signs: Temp: 98 F (36.7 C) (07/11 0529) BP: 138/68 mmHg (07/11 0529) Pulse Rate: 72  (07/11 0529)  Labs:  Brenda Kerr 03/10/12 0614  HGB 10.4*  HCT 31.4*  PLT 178  APTT --  LABPROT 14.9  INR 1.15  HEPARINUNFRC --  CREATININE --  CKTOTAL --  CKMB --  TROPONINI --    The CrCl is unknown because both a height and weight (above a minimum accepted value) are required for this calculation.   Medications:  Scheduled:    . acetaminophen  1,000 mg Oral Q6H  . amLODipine  10 mg Oral Daily  . bimatoprost  1 drop Both Eyes QHS  . brimonidine  1 drop Both Eyes Q12H  . brinzolamide  1 drop Both Eyes TID  .  ceFAZolin (ANCEF) IV  2 g Intravenous 60 min Pre-Op  .  ceFAZolin (ANCEF) IV  2 g Intravenous Q6H  . coumadin book   Does not apply Once  . docusate sodium  100 mg Oral BID  . ferrous sulfate  325 mg Oral TID WC  . HYDROmorphone PCA 0.3 mg/mL   Intravenous Q4H  . HYDROmorphone PCA 0.3 mg/mL      . labetalol  100 mg Oral BID  . timolol  1 drop Both Eyes Q12H  . warfarin  5 mg Oral ONCE-1800  . warfarin   Does not apply Once  . Warfarin - Pharmacist Dosing Inpatient   Does not apply q1800  . DISCONTD: acetaminophen  15 mg/kg Intravenous Q6H  . DISCONTD: brimonidine  1 drop Both Eyes Q12H  . DISCONTD: brimonidine-timolol  1 drop Both Eyes Q12H  . DISCONTD: chlorhexidine  60 mL Topical Once  . DISCONTD: timolol  1 drop Both Eyes Q12H    Assessment: Pt is a 71 yo F with a h/o bilateral DJD who underwent left THA on 7/10. Pt is currently on day 2 of warfarin therapy for VTE prophylaxis with a INR of 1.15 on 7/11 and Hgb of 10.4 on 7/11.   Goal of Therapy:  INR 2-3 Monitor platelets by anticoagulation protocol: Yes   Plan:  1. Continue warfarin 5mg  x 1 today 2. Daily PT/INR 3. Monitor  Hgb 4. Complete education process  Brenda Kerr, PharmD Clinical Pharmacist Phone: [phone] Pager: [phone] 03/10/2012 9:05 AM

## 2012-03-10 NOTE — Progress Notes (Signed)
Referral received for SNF. Chart reviewed and CSW has spoken with RNCM who indicates that patient is for DC to home with Home Health and DME.  CSW to sign off. Please re-consult if CSW needs arise.  Loyalty Brashier T. Neko Boyajian, LCSWA  [phone]  

## 2012-03-10 NOTE — Evaluation (Signed)
Physical Therapy Evaluation Patient Details Name: Brenda Kerr MRN: [id number] DOB: 1941-01-26 Today's Date: 03/10/2012 Time: 5621-3086 PT Time Calculation (min): 25 min  PT Assessment / Plan / Recommendation Clinical Impression  Pt is 71 y/o female admitted for s/p left posterior THA.  Pt will benefit from acute PT services to improve overall mobility and prepare for safe d/c home.    PT Assessment  Patient needs continued PT services    Follow Up Recommendations  Home health PT;Supervision/Assistance - 24 hour    Barriers to Discharge        Equipment Recommendations  None recommended by PT    Recommendations for Other Services     Frequency 7X/week    Precautions / Restrictions Precautions Precautions: Posterior Hip Precaution Booklet Issued: Yes (comment) Restrictions Weight Bearing Restrictions: Yes LLE Weight Bearing: Partial weight bearing LLE Partial Weight Bearing Percentage or Pounds: 50%   Pertinent Vitals/Pain 7/10 left hip pain      Mobility  Bed Mobility Bed Mobility: Supine to Sit Supine to Sit: 3: Mod assist;With rails Details for Bed Mobility Assistance: (A) with Left LE OOB and to elevate trunk OOB with cues for proper technique to prevent internal left hip rotation Transfers Transfers: Sit to Stand;Stand to Sit Sit to Stand: 4: Min assist;From bed Stand to Sit: 4: Min assist;To chair/3-in-1 Details for Transfer Assistance: (A) to initiate transfer with cues for hand placement and LE placement.  Cues to maintian PWB 50% during transfers Ambulation/Gait Ambulation/Gait Assistance: 4: Min assist Ambulation Distance (Feet): 20 Feet Assistive device: Rolling walker Ambulation/Gait Assistance Details: (A) to manage RW with cues for proper body placement within RW.  Encourage pt to stand upright.  Max cues for proper step sequence. Gait Pattern: Step-to pattern;Decreased step length - right;Trunk flexed    Exercises Total Joint Exercises Ankle  Circles/Pumps: AROM;10 reps;Both Quad Sets: AROM;Strengthening;Both;10 reps Heel Slides: AAROM;Left;10 reps;Seated   PT Diagnosis: Difficulty walking;Abnormality of gait;Generalized weakness;Acute pain  PT Problem List: Decreased strength;Decreased range of motion;Decreased activity tolerance;Decreased balance;Decreased mobility;Decreased knowledge of use of DME;Pain PT Treatment Interventions: DME instruction;Gait training;Stair training;Functional mobility training;Therapeutic activities;Therapeutic exercise;Patient/family education   PT Goals Acute Rehab PT Goals PT Goal Formulation: With patient Time For Goal Achievement: 03/17/12 Potential to Achieve Goals: Good Pt will go Sit to Supine/Side: with modified independence PT Goal: Sit to Supine/Side - Progress: Goal set today Pt will go Sit to Stand: with modified independence PT Goal: Sit to Stand - Progress: Goal set today Pt will go Stand to Sit: with modified independence PT Goal: Stand to Sit - Progress: Goal set today Pt will Ambulate: 51 - 150 feet;with modified independence;with rolling walker PT Goal: Ambulate - Progress: Goal set today Additional Goals Additional Goal #1: Pt able to recall and adhere to 3/3 posterior hip precuations. PT Goal: Additional Goal #1 - Progress: Goal set today  Visit Information  Last PT Received On: 03/10/12 Assistance Needed: +1    Subjective Data  Subjective: "I'll try my best." Patient Stated Goal: To go home with my daughters   Prior Functioning  Home Living Lives With: Spouse Available Help at Discharge: Other (Comment) (plans to d/c to daughter's home) Type of Home: House Home Access: Level entry Home Layout: One level Bathroom Shower/Tub: Tub/shower unit;Curtain Firefighter: Standard Bathroom Accessibility: Yes How Accessible: Accessible via walker Home Adaptive Equipment: Walker - rolling;Straight cane Prior Function Level of Independence: Independent with assistive  device(s) Able to Take Stairs?: Yes Driving: No Vocation: Retired Musician:  No difficulties    Cognition  Overall Cognitive Status: Appears within functional limits for tasks assessed/performed Arousal/Alertness: Awake/alert Orientation Level: Appears intact for tasks assessed Behavior During Session: [facility] for tasks performed    Extremity/Trunk Assessment Right Lower Extremity Assessment RLE ROM/Strength/Tone: Within functional levels Left Lower Extremity Assessment LLE ROM/Strength/Tone: Unable to fully assess;Due to pain;Due to precautions   Balance    End of Session PT - End of Session Equipment Utilized During Treatment: Gait belt Activity Tolerance: Patient tolerated treatment well Patient left: in chair;with call bell/phone within reach Nurse Communication: Mobility status  GP     Tametha Banning 03/10/2012, 11:36 AM Jake Shark, PT DPT [phone]

## 2012-03-10 NOTE — Progress Notes (Signed)
CARE MANAGEMENT NOTE 03/10/2012  Patient:  Brenda Kerr, Brenda Kerr   Account Number:  [id number]  Date Initiated:  03/10/2012  Documentation initiated by:  Vance Peper  Subjective/Objective Assessment:   71 yr old female s/p left total hip arthroplasty     Action/Plan:   CM spoke with patient regarding home health needs. Choice offered. Patient states she has rolling walker and 3in1. She will be going to her daughters home for 3 weeks at discharge. Will provide address.   Anticipated DC Date:  03/12/2012   Anticipated DC Plan:  HOME W HOME HEALTH SERVICES      DC Planning Services  CM consult      [facility] Choice  HOME HEALTH   Choice offered to / List presented to:  C-1 Patient        HH arranged  HH-1 RN  HH-2 PT      [facility] agency  [facility]   Status of service:  Completed, signed off Medicare Important Message given?   (If response is "NO", the following Medicare IM given date fields will be blank) Date Medicare IM given:   Date Additional Medicare IM given:    Discharge Disposition:  HOME W HOME HEALTH SERVICES  Per UR Regulation:    If discussed at Long Length of Stay Meetings, dates discussed:    Comments:

## 2012-03-11 LAB — CBC
Hemoglobin: 9.3 g/dL — ABNORMAL LOW (ref 12.0–15.0)
MCH: 27.9 pg (ref 26.0–34.0)
MCV: 85.9 fL (ref 78.0–100.0)
RBC: 3.33 MIL/uL — ABNORMAL LOW (ref 3.87–5.11)

## 2012-03-11 MED ORDER — WARFARIN SODIUM 5 MG PO TABS
5.0000 mg | ORAL_TABLET | Freq: Once | ORAL | Status: AC
  Administered 2012-03-11: 5 mg via ORAL
  Filled 2012-03-11: qty 1

## 2012-03-11 NOTE — Progress Notes (Signed)
Physical Therapy Treatment Patient Details Name: Brenda Kerr MRN: [id number] DOB: Jan 28, 1941 Today's Date: 03/11/2012 Time: 8295-6213 PT Time Calculation (min): 18 min  PT Assessment / Plan / Recommendation Comments on Treatment Session  Pt very limited due to pain this AM. However pt willing to sit in recliner to change overall position. Will attempt to ambulate further this PM and review HEP.    Follow Up Recommendations  Home health PT;Supervision/Assistance - 24 hour    Barriers to Discharge        Equipment Recommendations  None recommended by PT    Recommendations for Other Services    Frequency 7X/week   Plan Discharge plan remains appropriate;Frequency remains appropriate    Precautions / Restrictions Precautions Precautions: Posterior Hip Precaution Comments: reeducated on 3/3  posterior hip precautions but pt able to recall PWB 50% Restrictions Weight Bearing Restrictions: Yes LLE Weight Bearing: Partial weight bearing LLE Partial Weight Bearing Percentage or Pounds: 50%   Pertinent Vitals/Pain 10/10 left hip pain;  RN aware    Mobility  Transfers Transfers: Sit to Stand;Stand to Sit Sit to Stand: 4: Min assist;From bed Stand to Sit: 4: Min assist;To chair/3-in-1 Details for Transfer Assistance: (A) to initiate transfer with cues for hand placement and LE placement.  Cues to maintian PWB 50% during transfers Ambulation/Gait Ambulation/Gait Assistance: 4: Min guard Ambulation Distance (Feet): 8 Feet Assistive device: Rolling walker Ambulation/Gait Assistance Details: Limited ambulation due to pain however pt wanted to sit in recliner vs EOB to minimize pain.  Pt needed cues for proper step sequence and maintain PWB 50% Gait Pattern: Step-to pattern;Decreased step length - right;Trunk flexed    Exercises     PT Diagnosis:    PT Problem List:   PT Treatment Interventions:     PT Goals Acute Rehab PT Goals PT Goal Formulation: With patient Time For Goal  Achievement: 03/17/12 Potential to Achieve Goals: Good Pt will go Sit to Stand: with modified independence PT Goal: Sit to Stand - Progress: Progressing toward goal Pt will go Stand to Sit: with modified independence PT Goal: Stand to Sit - Progress: Progressing toward goal Pt will Ambulate: 51 - 150 feet;with modified independence;with rolling walker PT Goal: Ambulate - Progress: Progressing toward goal Pt will Perform Home Exercise Program: with supervision, verbal cues required/provided PT Goal: Perform Home Exercise Program - Progress: Not met Additional Goals Additional Goal #1: Pt able to recall and adhere to 3/3 posterior hip precuations. PT Goal: Additional Goal #1 - Progress: Not met  Visit Information  Last PT Received On: 03/11/12 Assistance Needed: +1    Subjective Data  Subjective: "I'm in a lot of pain this morning."   Cognition  Overall Cognitive Status: Appears within functional limits for tasks assessed/performed Arousal/Alertness: Awake/alert Orientation Level: Appears intact for tasks assessed Behavior During Session: Restless Cognition - Other Comments: Pt very restless sitting EOB due to pain    Balance  Balance Balance Assessed: Yes Static Sitting Balance Static Sitting - Balance Support: Feet supported Static Sitting - Level of Assistance: 5: Stand by assistance Static Sitting - Comment/# of Minutes: ~5 minutes prior to transfer with PT however pt has been sitting EOB attempting to eat breakfast  However pt very restless sitting EOB  End of Session PT - End of Session Equipment Utilized During Treatment: Gait belt Activity Tolerance: Patient tolerated treatment well Patient left: in chair;with call bell/phone within reach Nurse Communication: Mobility status   GP     Khristin Keleher 03/11/2012, 8:44 AM  DoverWendy Nandita [place], [place] DPT [phone]

## 2012-03-11 NOTE — Progress Notes (Signed)
PT progress NOTE   03/11/12 1400  PT Visit Information  Last PT Received On 03/11/12  Assistance Needed +1  PT Time Calculation  PT Start Time 1335  PT Stop Time 1400  PT Time Calculation (min) 25 min  Subjective Data  Subjective "I'm still in pain but feeling better."  Precautions  Precautions Posterior Hip  Precaution Comments reeducated on 3/3  posterior hip precautions but pt able to recall PWB 50%  Restrictions  Weight Bearing Restrictions Yes  LLE Weight Bearing PWB  LLE Partial Weight Bearing Percentage or Pounds 50%  Cognition  Overall Cognitive Status Appears within functional limits for tasks assessed/performed  Arousal/Alertness Awake/alert  Orientation Level Appears intact for tasks assessed  Behavior During Session [facility] for tasks performed  Transfers  Transfers Sit to Stand;Stand to Sit  Sit to Stand 4: Min assist;From bed  Stand to Sit 3: Mod assist;To chair/3-in-1;With armrests  Details for Transfer Assistance (A) to initiate transfer and to slowly descend to recliner.  Pt attempted to sit without proper body position and RW placement.  Pt needs cues to prevent leaning too far forward.  Ambulation/Gait  Ambulation/Gait Assistance 4: Min assist  Ambulation Distance (Feet) 20 Feet  Assistive device Rolling walker  Ambulation/Gait Assistance Details (A) to manage RW with max cues for RW placement and proper step squence.  Pt with severe forward flexion and needs constant cues to stand upright.  Gait Pattern Step-to pattern;Decreased step length - right;Trunk flexed  Balance  Balance Assessed Yes  Static Sitting Balance  Static Sitting - Balance Support Feet supported  Static Sitting - Level of Assistance 5: Stand by assistance  Exercises  Exercises Total Joint  Total Joint Exercises  Ankle Circles/Pumps AROM;10 reps;Both  Quad Sets AROM;Strengthening;Both;10 reps  Heel Slides AAROM;Left;10 reps;Seated  Short Arc Quad Strengthening;Left;10 reps;Supine  Hip  ABduction/ADduction Strengthening;Left;10 reps;Supine  PT - End of Session  Equipment Utilized During Treatment Gait belt  Activity Tolerance Patient tolerated treatment well  Patient left in chair;with call bell/phone within reach  Nurse Communication Mobility status  PT - Assessment/Plan  Comments on Treatment Session Pt able to increase ambulation distance compare to morning session however continues to be limited due to pain and overall fatigue.  Pt with increase forward flexion during ambulation this session.  Pt continues to need reeducation on 3/3 posterior hip precautions and constant cues to prevent bending past 90 degrees.  PT Plan Discharge plan remains appropriate;Frequency remains appropriate  PT Frequency 7X/week  Follow Up Recommendations Home health PT;Supervision/Assistance - 24 hour  Equipment Recommended None recommended by PT  Acute Rehab PT Goals  PT Goal Formulation With patient  Time For Goal Achievement 03/17/12  Potential to Achieve Goals Good  Pt will go Sit to Stand with modified independence  PT Goal: Sit to Stand - Progress Progressing toward goal  Pt will go Stand to Sit with modified independence  PT Goal: Stand to Sit - Progress Progressing toward goal  Pt will Ambulate 51 - 150 feet;with modified independence;with rolling walker  PT Goal: Ambulate - Progress Progressing toward goal  Pt will Perform Home Exercise Program with supervision, verbal cues required/provided  PT Goal: Perform Home Exercise Program - Progress Progressing toward goal  Additional Goals  Additional Goal #1 Pt able to recall and adhere to 3/3 posterior hip precuations.  PT Goal: Additional Goal #1 - Progress Not met  PT General Charges  $$ ACUTE PT VISIT 1 Procedure  PT Treatments  $Gait Training 8-22 mins  $  Therapeutic Activity 8-22 mins     [place], [place] DPT [phone]

## 2012-03-11 NOTE — Progress Notes (Signed)
Occupational Therapy Treatment Patient Details Name: Brenda Kerr MRN: [id number] DOB: 01-24-1941 Today's Date: 03/11/2012 Time: 1308-6578 OT Time Calculation (min): 13 min  OT Assessment / Plan / Recommendation Comments on Treatment Session Pt unable to meaningfully participate due to lethargy today. Upon arrival and at the beginning of session, pt appeared to be alert but as the session progressed, pt was unable to retain info or return demo.    Follow Up Recommendations  Supervision/Assistance - 24 hour;Home health OT    Barriers to Discharge       Equipment Recommendations  3 in 1 bedside comode    Recommendations for Other Services    Frequency Min 2X/week   Plan Discharge plan remains appropriate    Precautions / Restrictions Precautions Precautions: Posterior Hip Precaution Comments: Pt was only able to recall 1/3 hip precautions. Restrictions Weight Bearing Restrictions: Yes LLE Weight Bearing: Partial weight bearing LLE Partial Weight Bearing Percentage or Pounds: 50%   Pertinent Vitals/Pain     ADL  Upper Body Dressing: Performed;Moderate assistance Where Assessed - Upper Body Dressing: Unsupported sitting ADL Comments: Attempted to educate pt in use of AE but it became apparant that pt was too lethargic to participate. When educated on how to use sock aid,  pt could not recall how to put the sock over it, attempted to don over the same foot and was unable to manipulate the reacher. Pt has been lethargic due to pain meds all day.    OT Diagnosis:    OT Problem List:   OT Treatment Interventions:     OT Goals ADL Goals ADL Goal: Lower Body Dressing - Progress: Not progressing ADL Goal: Additional Goal #1 - Progress: Not progressing  Visit Information  Last OT Received On: 03/11/12 Assistance Needed: +1    Subjective Data  Subjective: They gave me a lot of medicine today.   Prior Functioning       Cognition  Overall Cognitive Status: Appears within  functional limits for tasks assessed/performed Arousal/Alertness: Lethargic Orientation Level: Appears intact for tasks assessed Behavior During Session: Lethargic Cognition - Other Comments: Appears to be due to medication.    Mobility   Exercises   Balance   End of Session OT - End of Session Activity Tolerance: Patient limited by fatigue Patient left: in chair;with call bell/phone within reach  GO     Robyn Nohr A OTR/L [phone] 03/11/2012, 3:37 PM

## 2012-03-11 NOTE — Progress Notes (Signed)
ANTICOAGULATION CONSULT NOTE - Follow Up Consult  Pharmacy Consult for Warfarin Indication: VTE prophylaxis  Allergies  Allergen Reactions  . Codeine Nausea And Vomiting   Vital Signs: Temp: 99.5 F (37.5 C) (07/12 0608) BP: 144/43 mmHg (07/12 0608) Pulse Rate: 93  (07/12 0608)  Labs:  Basename 03/11/12 0635 03/10/12 0614  HGB 9.3* 10.4*  HCT 28.6* 31.4*  PLT 155 178  APTT -- --  LABPROT 16.8* 14.9  INR 1.34 1.15  HEPARINUNFRC -- --  CREATININE -- --  CKTOTAL -- --  CKMB -- --  TROPONINI -- --    The CrCl is unknown because both a height and weight (above a minimum accepted value) are required for this calculation.   Medications:  Scheduled:     . acetaminophen  1,000 mg Oral Q6H  . amLODipine  10 mg Oral Daily  . bimatoprost  1 drop Both Eyes QHS  . brimonidine  1 drop Both Eyes Q12H  . brinzolamide  1 drop Both Eyes TID  . coumadin book   Does not apply Once  . docusate sodium  100 mg Oral BID  . ferrous sulfate  325 mg Oral TID WC  . HYDROmorphone PCA 0.3 mg/mL   Intravenous Q4H  . labetalol  100 mg Oral BID  . timolol  1 drop Both Eyes Q12H  . warfarin  5 mg Oral ONCE-1800  . warfarin   Does not apply Once  . 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 Pt is currently on day 3 of warfarin therapy for VTE prophylaxis with a INR of 1.34  and Hgb of 9.3.   Goal of Therapy:  INR 2-3 Monitor platelets by anticoagulation protocol: Yes   Plan:  1. Continue warfarin 5mg  x 1 today 2. Daily PT/INR  Harland German, Pharm D 03/11/2012 8:26 AM

## 2012-03-11 NOTE — Progress Notes (Signed)
Subjective: 2 Days Post-Op Procedure(s) (LRB): TOTAL HIP ARTHROPLASTY (Left) Patient reports pain as 2 on 0-10 scale.    Objective: Vital signs in last 24 hours: Temp:  [99 Kerr (37.2 C)-100 Kerr (37.8 C)] 99.5 Kerr (37.5 C) (07/12 0608) Pulse Rate:  [88-93] 93  (07/12 0608) Resp:  [16-23] 16  (07/12 1203) BP: (119-144)/(43-97) 144/43 mmHg (07/12 0608) SpO2:  [97 %-99 %] 97 % (07/12 1203)  Intake/Output from previous day: 07/11 0701 - 07/12 0700 In: 780 [P.O.:480; I.V.:300] Out: 300 [Urine:300] Intake/Output this shift: Total I/O In: 240 [P.O.:240] Out: -    Basename 03/11/12 0635 03/10/12 0614  HGB 9.3* 10.4*    Basename 03/11/12 0635 03/10/12 0614  WBC 9.7 9.0  RBC 3.33* 3.64*  HCT 28.6* 31.4*  PLT 155 178   No results found for this basename: NA:2,K:2,CL:2,CO2:2,BUN:2,CREATININE:2,GLUCOSE:2,CALCIUM:2 in the last 72 hours  Basename 03/11/12 0635 03/10/12 0614  LABPT -- --  INR 1.34 1.15    Neurologically intact  Assessment/Plan: 2 Days Post-Op Procedure(s) (LRB): TOTAL HIP ARTHROPLASTY (Left) Up with therapy  Brenda Kerr 03/11/2012, 2:42 PM

## 2012-03-12 LAB — CBC
HCT: 27.4 % — ABNORMAL LOW (ref 36.0–46.0)
Hemoglobin: 9.1 g/dL — ABNORMAL LOW (ref 12.0–15.0)
MCH: 28.5 pg (ref 26.0–34.0)
MCV: 85.9 fL (ref 78.0–100.0)
RBC: 3.19 MIL/uL — ABNORMAL LOW (ref 3.87–5.11)

## 2012-03-12 MED ORDER — WARFARIN SODIUM 7.5 MG PO TABS
7.5000 mg | ORAL_TABLET | Freq: Once | ORAL | Status: DC
  Filled 2012-03-12: qty 1

## 2012-03-12 MED ORDER — WARFARIN SODIUM 1 MG PO TABS: 5.0000 mg | ORAL_TABLET | Freq: Every day | ORAL | Status: DC

## 2012-03-12 MED ORDER — METHOCARBAMOL 500 MG PO TABS: 500.0000 mg | ORAL_TABLET | Freq: Four times a day (QID) | ORAL | Status: AC | PRN

## 2012-03-12 MED ORDER — WARFARIN - PHARMACIST DOSING INPATIENT: 5.0000 | Freq: Every day | Status: DC

## 2012-03-12 MED ORDER — FERROUS SULFATE 325 (65 FE) MG PO TABS: 325.0000 mg | ORAL_TABLET | Freq: Three times a day (TID) | ORAL | Status: DC

## 2012-03-12 MED ORDER — OXYCODONE HCL 5 MG PO TABS: 5.0000 mg | ORAL_TABLET | ORAL | Status: AC | PRN

## 2012-03-12 NOTE — Progress Notes (Signed)
03/12/2012 1200 Received call from [place].  Faxed orders and facesheet to [facility] for [facility]. Isidoro Donning RN CCM Case Mgmt phone [phone]

## 2012-03-12 NOTE — Progress Notes (Signed)
ANTICOAGULATION CONSULT NOTE - Follow Up Consult  Pharmacy Consult for Warfarin Indication: VTE prophylaxis  Allergies  Allergen Reactions  . Codeine Nausea And Vomiting   Vital Signs: Temp: 100.5 F (38.1 C) (07/13 0613) BP: 140/46 mmHg (07/13 0613) Pulse Rate: 93  (07/13 0613)  Labs:  Basename 03/12/12 0545 03/11/12 0635 03/10/12 0614  HGB 9.1* 9.3* --  HCT 27.4* 28.6* 31.4*  PLT 164 155 178  APTT -- -- --  LABPROT 19.0* 16.8* 14.9  INR 1.56* 1.34 1.15  HEPARINUNFRC -- -- --  CREATININE -- -- --  CKTOTAL -- -- --  CKMB -- -- --  TROPONINI -- -- --    The CrCl is unknown because both a height and weight (above a minimum accepted value) are required for this calculation.   Medications:  Scheduled:     . amLODipine  10 mg Oral Daily  . bimatoprost  1 drop Both Eyes QHS  . brimonidine  1 drop Both Eyes Q12H  . brinzolamide  1 drop Both Eyes TID  . coumadin book   Does not apply Once  . docusate sodium  100 mg Oral BID  . ferrous sulfate  325 mg Oral TID WC  . HYDROmorphone PCA 0.3 mg/mL   Intravenous Q4H  . labetalol  100 mg Oral BID  . timolol  1 drop Both Eyes Q12H  . warfarin  5 mg Oral ONCE-1800  . warfarin   Does not apply Once  . Warfarin - Pharmacist Dosing Inpatient   Does not apply q1800    Assessment: INR 1.56 in this 71 yo F with a h/o bilateral DJD, POD #3 s/p left THA. Marland Kitchen Pt is currently on day 4 of warfarin therapy for VTE prophylaxis. INR gradually increasing toward goal of 2-3.  Hgb of 9.1, pltc 164K stable. No bleeding reported.   Goal of Therapy:  INR 2-3 Monitor platelets by anticoagulation protocol: Yes   Plan:  Warfarin 7.5mg  x 1 today Daily PT/INR  Noah Delaine, RPh Clinical Pharmacist [phone]  03/12/2012 10:56 AM

## 2012-03-12 NOTE — Progress Notes (Signed)
D/C instructions reviewed with patient and daughter. rx x 3 given. hh equipment at home. hh services arranged with [facility]. All questions answered. Suppository given prior to d/c. Pt refused further laxatives- educated pt to continue bowel regimen at home until bm. Pt verbalized understanding. Pt d/c'ed via wheelchair in stable condition

## 2012-03-12 NOTE — Progress Notes (Signed)
Occupational Therapy Treatment Patient Details Name: Brenda Kerr MRN: [id number] DOB: [birth year] Today's Date: 03/12/2012 Time: 8295-6213 OT Time Calculation (min): 25 min  OT Assessment / Plan / Recommendation Comments on Treatment Session Completed family education. Pt to continue with HHOT. Issued AE due to financial constraints.    Follow Up Recommendations  Supervision/Assistance - 24 hour;Home health OT    Barriers to Discharge       Equipment Recommendations  3 in 1 bedside comode    Recommendations for Other Services    Frequency Min 2X/week   Plan Discharge plan remains appropriate    Precautions / Restrictions Precautions Precautions: Posterior Hip Precaution Booklet Issued: Yes (comment) Precaution Comments: able to recall 3/3 precautions, including WBS Restrictions Weight Bearing Restrictions: Yes LLE Weight Bearing: Partial weight bearing LLE Partial Weight Bearing Percentage or Pounds: 50%   Pertinent Vitals/Pain none    ADL  Lower Body Bathing: Performed;Supervision/safety;Other (comment) (with AE) Where Assessed - Lower Body Bathing: Unsupported sit to stand Upper Body Dressing: Performed;Modified independent Where Assessed - Upper Body Dressing: Unsupported sitting Lower Body Dressing: Performed;Minimal assistance;Other (comment) (vc for following precautions with AE) Where Assessed - Lower Body Dressing: Unsupported sit to stand Toilet Transfer: Performed;Supervision/safety Toilet Transfer Method: Surveyor, minerals: Materials engineer and Hygiene: Performed;Supervision/safety Where Assessed - Engineer, mining and Hygiene: Standing Tub/Shower Transfer: Performed;Minimal assistance Tub/Shower Transfer Method: Ambulating;Other (comment) (back up) Tub/Shower Transfer Equipment: Other (comment) (3 in 1) Equipment Used: Gait belt;Long-handled shoe horn;Long-handled sponge;Reacher;Rolling  walker;Sock aid Transfers/Ambulation Related to ADLs: S. Difficulty maintaining 50% WBS. ADL Comments: Pt's daughter educated in use of AE and tshower transfer with pt. Instruced daughter to have therapist practice with shower transfer before she helps her mother due to pt having difficulty maintating 50% WBS when stepping over threshold.    OT Diagnosis:    OT Problem List:   OT Treatment Interventions:     OT Goals Acute Rehab OT Goals OT Goal Formulation: With patient Time For Goal Achievement: 03/17/12 Potential to Achieve Goals: Good ADL Goals Pt Will Perform Lower Body Dressing: with min assist;Sit to stand from bed;Sit to stand from chair ADL Goal: Lower Body Dressing - Progress: Met Pt Will Perform Tub/Shower Transfer: Shower transfer;Ambulation;with DME ADL Goal: Tub/Shower Transfer - Progress: Met Additional ADL Goal #1: Pt will verbalize/generalize 3/3 posterior hip precautions and PWB status LLE for use with functional activities ADL Goal: Additional Goal #1 - Progress: Met  Visit Information  Last OT Received On: 03/12/12 Assistance Needed: +1    Subjective Data      Prior Functioning       Cognition  Overall Cognitive Status: Appears within functional limits for tasks assessed/performed Arousal/Alertness: Awake/alert Orientation Level: Appears intact for tasks assessed Behavior During Session: [facility] for tasks performed    Mobility Bed Mobility Bed Mobility: Supine to Sit Supine to Sit: 4: Min assist;With rails;HOB elevated (HOB 45 degrees) Details for Bed Mobility Assistance: Assist with LLE to EOB. Transfers Transfers: Sit to Stand;Stand to Sit Sit to Stand: 5: Supervision;From chair/3-in-1 Stand to Sit: 5: Supervision;To chair/3-in-1 Details for Transfer Assistance: vc for hand placement and LLE positioning   Exercises Total Joint Exercises Ankle Circles/Pumps: AROM;10 reps;Both Quad Sets: AROM;Strengthening;Both;10 reps Heel Slides: AAROM;Left;10  reps;Supine Hip ABduction/ADduction: Strengthening;Left;10 reps;Supine Long Arc Quad: AROM;Left;10 reps;Seated  Balance Balance Balance Assessed: Yes Static Sitting Balance Static Sitting - Balance Support: Feet supported Static Sitting - Level of Assistance: 5: Stand by assistance Static  Sitting - Comment/# of Minutes: 3  End of Session OT - End of Session Equipment Utilized During Treatment: Gait belt Activity Tolerance: Patient tolerated treatment well Patient left: in chair;with call bell/phone within reach;with family/visitor present Nurse Communication: Mobility status  GO     Deeandra Jerry,HILLARY 03/12/2012, 11:31 AM Luisa Dago, OTR/L  [phone] 03/12/2012

## 2012-03-12 NOTE — Progress Notes (Signed)
Occupational Therapy Treatment Patient Details Name: Brenda Kerr MRN: [id number] DOB: [birth year] Today's Date: 03/12/2012 Time: 1020-1040 OT Time Calculation (min): 20 min  OT Assessment / Plan / Recommendation Comments on Treatment Session Making excellent progress. Will review shower transfer and hip precautions with daughter when she arrives.    Follow Up Recommendations  Supervision/Assistance - 24 hour;Home health OT    Barriers to Discharge       Equipment Recommendations  3 in 1 bedside comode    Recommendations for Other Services    Frequency Min 2X/week   Plan Discharge plan remains appropriate    Precautions / Restrictions Precautions Precautions: Posterior Hip Precaution Comments: recalled 2/3 precautions Restrictions Weight Bearing Restrictions: Yes LLE Weight Bearing: Partial weight bearing LLE Partial Weight Bearing Percentage or Pounds: 50%   Pertinent Vitals/Pain none    ADL  Lower Body Bathing: Performed;Supervision/safety;Other (comment) (with AE) Where Assessed - Lower Body Bathing: Unsupported sit to stand Upper Body Dressing: Performed;Modified independent Where Assessed - Upper Body Dressing: Unsupported sitting Lower Body Dressing: Performed;Minimal assistance;Other (comment) (vc for following precautions with AE) Where Assessed - Lower Body Dressing: Unsupported sit to stand Toilet Transfer: Performed;Supervision/safety Toilet Transfer Method: Surveyor, minerals: Materials engineer and Hygiene: Performed;Supervision/safety Where Assessed - Engineer, mining and Hygiene: Cabin crew:  (BSC) Equipment Used: Gait belt;Reacher;Rolling walker;Sock aid;Long-handled shoe horn;Long-handled sponge    OT Diagnosis:    OT Problem List:   OT Treatment Interventions:     OT Goals ADL Goals Pt Will Perform Lower Body Dressing: with min assist;Sit to stand from  bed;Sit to stand from chair ADL Goal: Lower Body Dressing - Progress: Met Pt Will Perform Tub/Shower Transfer: Shower transfer;Ambulation;with DME ADL Goal: Web designer - Progress: Progressing toward goals Additional ADL Goal #1: Pt will verbalize/generalize 3/3 posterior hip precautions and PWB status LLE for use with functional activities ADL Goal: Additional Goal #1 - Progress: Progressing toward goals  Visit Information  Last OT Received On: 03/12/12 Assistance Needed: +1    Subjective Data      Prior Functioning       Cognition  Overall Cognitive Status: Appears within functional limits for tasks assessed/performed Arousal/Alertness: Awake/alert Orientation Level: Appears intact for tasks assessed Behavior During Session: [facility] for tasks performed    Mobility Bed Mobility Bed Mobility: Supine to Sit Supine to Sit: 4: Min assist;With rails;HOB elevated (HOB 45 degrees) Details for Bed Mobility Assistance: Assist with LLE to EOB. Transfers Sit to Stand: 4: Min guard;With upper extremity assist;With armrests;From bed;From toilet Stand to Sit: 4: Min guard;With upper extremity assist;With armrests;To chair/3-in-1 Details for Transfer Assistance: Cues for technique/hand placement.  Cues for L LE placement and PWB.   Exercises Total Joint Exercises Ankle Circles/Pumps: AROM;10 reps;Both Quad Sets: AROM;Strengthening;Both;10 reps Heel Slides: AAROM;Left;10 reps;Supine Hip ABduction/ADduction: Strengthening;Left;10 reps;Supine Long Arc Quad: AROM;Left;10 reps;Seated  Balance Balance Balance Assessed: Yes Static Sitting Balance Static Sitting - Balance Support: Feet supported Static Sitting - Level of Assistance: 5: Stand by assistance Static Sitting - Comment/# of Minutes: 3  End of Session OT - End of Session Equipment Utilized During Treatment: Gait belt Activity Tolerance: Patient tolerated treatment well Patient left: in chair;with call bell/phone within  reach Nurse Communication: Mobility status  GO     Ermagene Saidi,HILLARY 03/12/2012, 11:26 AM Luisa Dago, OTR/L  [phone] 03/12/2012

## 2012-03-12 NOTE — Progress Notes (Signed)
Physical Therapy Treatment Patient Details Name: Brenda Kerr MRN: [id number] DOB: Feb 12, 1941 Today's Date: 03/12/2012 Time: 1478-2956 PT Time Calculation (min): 26 min  PT Assessment / Plan / Recommendation Comments on Treatment Session  Pt continues to be limited in distance.  Continues to require cues on precautions and safe mobility.  Pt reports that her daughters house has no steps to enter.  Will confirm with daughter when she arrives.    Follow Up Recommendations  Home health PT;Supervision/Assistance - 24 hour    Barriers to Discharge        Equipment Recommendations  3 in 1 bedside comode    Recommendations for Other Services    Frequency 7X/week   Plan Discharge plan remains appropriate;Frequency remains appropriate    Precautions / Restrictions Precautions Precautions: Posterior Hip Precaution Comments: Pt was only able to recall 1/3 hip precautions. Restrictions Weight Bearing Restrictions: Yes LLE Weight Bearing: Partial weight bearing LLE Partial Weight Bearing Percentage or Pounds: 50%   Pertinent Vitals/Pain Pt c/o L Hip pain.  RN alerted and brought meds.  Pt unable to rate.    Mobility  Bed Mobility Bed Mobility: Supine to Sit Supine to Sit: 4: Min assist;With rails;HOB elevated (HOB 45 degrees) Details for Bed Mobility Assistance: Assist with LLE to EOB. Transfers Transfers: Sit to Stand;Stand to Sit Sit to Stand: 4: Min guard;With upper extremity assist;With armrests;From bed;From toilet Stand to Sit: 4: Min guard;With upper extremity assist;With armrests;To chair/3-in-1 Details for Transfer Assistance: Cues for technique/hand placement.  Cues for L LE placement and PWB. Ambulation/Gait Ambulation/Gait Assistance: 4: Min guard Ambulation Distance (Feet): 20 Feet (twice) Assistive device: Rolling walker Ambulation/Gait Assistance Details: Frequent cues for gait sequence and step length.  Cues for upright posture. Gait Pattern: Step-to  pattern;Decreased step length - right;Trunk flexed    Exercises Total Joint Exercises Ankle Circles/Pumps: AROM;10 reps;Both Quad Sets: AROM;Strengthening;Both;10 reps Heel Slides: AAROM;Left;10 reps;Supine Hip ABduction/ADduction: Strengthening;Left;10 reps;Supine Long Arc Quad: AROM;Left;10 reps;Seated   PT Goals Acute Rehab PT Goals PT Goal Formulation: With patient Time For Goal Achievement: 03/17/12 Potential to Achieve Goals: Good PT Goal: Sit to Supine/Side - Progress: Progressing toward goal PT Goal: Sit to Stand - Progress: Progressing toward goal PT Goal: Stand to Sit - Progress: Progressing toward goal PT Goal: Ambulate - Progress: Progressing toward goal PT Goal: Perform Home Exercise Program - Progress: Progressing toward goal Additional Goals PT Goal: Additional Goal #1 - Progress: Progressing toward goal  Visit Information  Last PT Received On: 03/12/12 Assistance Needed: +1    Subjective Data  Subjective: "I can't remember the last one."  [id number]   Cognition  Overall Cognitive Status: Appears within functional limits for tasks assessed/performed Arousal/Alertness: Awake/alert Orientation Level: Appears intact for tasks assessed    Balance  Balance Balance Assessed: Yes Static Sitting Balance Static Sitting - Balance Support: Feet supported Static Sitting - Level of Assistance: 5: Stand by assistance Static Sitting - Comment/# of Minutes: 3  End of Session PT - End of Session Equipment Utilized During Treatment: Gait belt Activity Tolerance: Patient tolerated treatment well Patient left: in chair;with call bell/phone within reach Nurse Communication: Mobility status     Newell Coral 03/12/2012, 10:01 AM  Newell Coral, PTA Acute Rehab [phone] (office)

## 2012-03-13 NOTE — Discharge Summary (Signed)
Brenda Kerr, Brenda Kerr               ACCOUNT NO.:  [id number]  MEDICAL RECORD NO.:  [id number]  LOCATION:  5N14C                        FACILITY:  [facility]  PHYSICIAN:  Myrtie Neither, MD      DATE OF BIRTH:  Sep 20, 1940  DATE OF ADMISSION:  03/09/2012 DATE OF DISCHARGE:  03/12/2012                              DISCHARGE SUMMARY   ADMITTING DIAGNOSIS:  Degenerative arthropathy, left hip.  DISCHARGE DIAGNOSIS:  Degenerative arthropathy, left hip.  COMPLICATION:  None.  INFECTIONS:  None.  OPERATIONS:  Left total hip arthroplasty.  PERTINENT HISTORY:  This is a 71 year old female who was being followed in the office for degenerative joint arthropathy, bilateral hips.  Right being worse than the left with progressive worsening of the left hip with increased pain and loss of function.  X-rays of the left hip shows loss of joint space with sclerosis and bone-to-bone contact.  Pertinent physical exam of the left hip; limited range of motion, tender anteriorly and laterally with pain on attempted extension as well as rotation of the left hip.  HOSPITAL COURSE:  The patient underwent preop laboratory; CBC, EKG, chest x-ray, PT, PTT, platelet count, CMET, UA.  The patient's lab was stable enough to undergo surgery.  The patient underwent left total hip arthroplasty.  Tolerated the procedure quite well postoperatively. Blood count dropped down to 9 and 27.  Acute blood loss anemia, treated with Feosol 325 mg t.i.d.  The patient's wound is healing quite well. Started on partial weightbearing, 50% weightbearing on the left side. The patient is stable enough to be discharged home with home health and physical therapy, partial weightbearing on the left hip, to return to the office in 1 week.  The patient is to have daily dressing changes, continue Coumadin, weekly INRs.  The patient will be discharged on Percocet 1-2 q.4 p.r.n. for pain, Feosol 325 mg t.i.d., Robaxin 500 mg b.i.d., and  Coumadin.  The patient is to remain on her previous medications other than her Motrin.  She is to discontinue that.  The patient to return to the office in 1 week.  The patient is being discharged in stable and satisfactory condition.     Myrtie Neither, MD     AC/MEDQ  D:  03/12/2012  T:  03/13/2012  Job:  [id number]

## 2012-03-15 DIAGNOSIS — I10 Essential (primary) hypertension: Secondary | ICD-10-CM | POA: Diagnosis not present

## 2012-03-15 DIAGNOSIS — K589 Irritable bowel syndrome without diarrhea: Secondary | ICD-10-CM | POA: Diagnosis not present

## 2012-03-15 DIAGNOSIS — Z96649 Presence of unspecified artificial hip joint: Secondary | ICD-10-CM | POA: Diagnosis not present

## 2012-03-15 DIAGNOSIS — Z471 Aftercare following joint replacement surgery: Secondary | ICD-10-CM | POA: Diagnosis not present

## 2012-03-15 DIAGNOSIS — F319 Bipolar disorder, unspecified: Secondary | ICD-10-CM | POA: Diagnosis not present

## 2012-03-17 LAB — POCT I-STAT 4, (NA,K, GLUC, HGB,HCT)
HCT: 31 % — ABNORMAL LOW (ref 36.0–46.0)
Hemoglobin: 10.5 g/dL — ABNORMAL LOW (ref 12.0–15.0)

## 2012-03-18 DIAGNOSIS — I10 Essential (primary) hypertension: Secondary | ICD-10-CM | POA: Diagnosis not present

## 2012-03-18 DIAGNOSIS — Z96649 Presence of unspecified artificial hip joint: Secondary | ICD-10-CM | POA: Diagnosis not present

## 2012-03-18 DIAGNOSIS — Z471 Aftercare following joint replacement surgery: Secondary | ICD-10-CM | POA: Diagnosis not present

## 2012-03-18 DIAGNOSIS — K589 Irritable bowel syndrome without diarrhea: Secondary | ICD-10-CM | POA: Diagnosis not present

## 2012-03-18 DIAGNOSIS — F319 Bipolar disorder, unspecified: Secondary | ICD-10-CM | POA: Diagnosis not present

## 2012-03-22 DIAGNOSIS — F319 Bipolar disorder, unspecified: Secondary | ICD-10-CM | POA: Diagnosis not present

## 2012-03-22 DIAGNOSIS — K589 Irritable bowel syndrome without diarrhea: Secondary | ICD-10-CM | POA: Diagnosis not present

## 2012-03-22 DIAGNOSIS — Z96649 Presence of unspecified artificial hip joint: Secondary | ICD-10-CM | POA: Diagnosis not present

## 2012-03-22 DIAGNOSIS — Z471 Aftercare following joint replacement surgery: Secondary | ICD-10-CM | POA: Diagnosis not present

## 2012-03-22 DIAGNOSIS — I10 Essential (primary) hypertension: Secondary | ICD-10-CM | POA: Diagnosis not present

## 2012-03-28 DIAGNOSIS — F319 Bipolar disorder, unspecified: Secondary | ICD-10-CM | POA: Diagnosis not present

## 2012-03-28 DIAGNOSIS — Z96649 Presence of unspecified artificial hip joint: Secondary | ICD-10-CM | POA: Diagnosis not present

## 2012-03-28 DIAGNOSIS — I10 Essential (primary) hypertension: Secondary | ICD-10-CM | POA: Diagnosis not present

## 2012-03-28 DIAGNOSIS — Z471 Aftercare following joint replacement surgery: Secondary | ICD-10-CM | POA: Diagnosis not present

## 2012-03-28 DIAGNOSIS — K589 Irritable bowel syndrome without diarrhea: Secondary | ICD-10-CM | POA: Diagnosis not present

## 2012-03-29 DIAGNOSIS — Z471 Aftercare following joint replacement surgery: Secondary | ICD-10-CM | POA: Diagnosis not present

## 2012-03-29 DIAGNOSIS — K589 Irritable bowel syndrome without diarrhea: Secondary | ICD-10-CM | POA: Diagnosis not present

## 2012-03-29 DIAGNOSIS — I10 Essential (primary) hypertension: Secondary | ICD-10-CM | POA: Diagnosis not present

## 2012-03-29 DIAGNOSIS — F319 Bipolar disorder, unspecified: Secondary | ICD-10-CM | POA: Diagnosis not present

## 2012-03-29 DIAGNOSIS — Z96649 Presence of unspecified artificial hip joint: Secondary | ICD-10-CM | POA: Diagnosis not present

## 2012-04-01 DIAGNOSIS — F319 Bipolar disorder, unspecified: Secondary | ICD-10-CM | POA: Diagnosis not present

## 2012-04-01 DIAGNOSIS — I10 Essential (primary) hypertension: Secondary | ICD-10-CM | POA: Diagnosis not present

## 2012-04-01 DIAGNOSIS — Z471 Aftercare following joint replacement surgery: Secondary | ICD-10-CM | POA: Diagnosis not present

## 2012-04-01 DIAGNOSIS — K589 Irritable bowel syndrome without diarrhea: Secondary | ICD-10-CM | POA: Diagnosis not present

## 2012-04-01 DIAGNOSIS — Z96649 Presence of unspecified artificial hip joint: Secondary | ICD-10-CM | POA: Diagnosis not present

## 2012-04-04 DIAGNOSIS — F319 Bipolar disorder, unspecified: Secondary | ICD-10-CM | POA: Diagnosis not present

## 2012-04-04 DIAGNOSIS — Z471 Aftercare following joint replacement surgery: Secondary | ICD-10-CM | POA: Diagnosis not present

## 2012-04-04 DIAGNOSIS — Z96649 Presence of unspecified artificial hip joint: Secondary | ICD-10-CM | POA: Diagnosis not present

## 2012-04-04 DIAGNOSIS — I10 Essential (primary) hypertension: Secondary | ICD-10-CM | POA: Diagnosis not present

## 2012-04-04 DIAGNOSIS — K589 Irritable bowel syndrome without diarrhea: Secondary | ICD-10-CM | POA: Diagnosis not present

## 2012-04-06 DIAGNOSIS — Z471 Aftercare following joint replacement surgery: Secondary | ICD-10-CM | POA: Diagnosis not present

## 2012-04-06 DIAGNOSIS — I10 Essential (primary) hypertension: Secondary | ICD-10-CM | POA: Diagnosis not present

## 2012-04-06 DIAGNOSIS — Z96649 Presence of unspecified artificial hip joint: Secondary | ICD-10-CM | POA: Diagnosis not present

## 2012-04-06 DIAGNOSIS — K589 Irritable bowel syndrome without diarrhea: Secondary | ICD-10-CM | POA: Diagnosis not present

## 2012-04-06 DIAGNOSIS — F319 Bipolar disorder, unspecified: Secondary | ICD-10-CM | POA: Diagnosis not present

## 2012-04-19 ENCOUNTER — Ambulatory Visit (HOSPITAL_COMMUNITY)
Admission: RE | Admit: 2012-04-19 | Discharge: 2012-04-19 | Disposition: A | Discharge status: Home / Self Care | Payer: Medicare Other | Source: Ambulatory Visit | Attending: Orthopedic Surgery | Admitting: Orthopedic Surgery

## 2012-04-19 DIAGNOSIS — IMO0001 Reserved for inherently not codable concepts without codable children: Secondary | ICD-10-CM | POA: Diagnosis not present

## 2012-04-19 DIAGNOSIS — M25559 Pain in unspecified hip: Secondary | ICD-10-CM | POA: Diagnosis not present

## 2012-04-19 DIAGNOSIS — M25659 Stiffness of unspecified hip, not elsewhere classified: Secondary | ICD-10-CM | POA: Insufficient documentation

## 2012-04-19 DIAGNOSIS — M6281 Muscle weakness (generalized): Secondary | ICD-10-CM | POA: Diagnosis not present

## 2012-04-19 DIAGNOSIS — R269 Unspecified abnormalities of gait and mobility: Secondary | ICD-10-CM | POA: Diagnosis not present

## 2012-04-19 NOTE — Evaluation (Signed)
Physical Therapy Evaluation  Patient Details  Name: Brenda Kerr MRN: [id number] Date of Birth: 13-Jun-1941  Today's Date: 04/19/2012 Time: 1450-1530 PT Time Calculation (min): 40 min Charges: 1 eval Visit#: 1  of 10   Re-eval: 05/19/12 Assessment Diagnosis: L THR Surgical Date: 03/09/12 Next MD Visit: Dr. Montez Morita- Next week Prior Therapy: HHPT: 2-3 weeks   Authorization: MEDICARE  Authorization Time Period: G-code: mobility: Current: CM Goal: CK  Authorization Visit#: 1  of 10    Past Medical History:  Past Medical History  Diagnosis Date  . Hypertension   . Glaucoma   . Dysrhythmia   . Shortness of breath     walk a long way  . Peripheral vascular disease   . GERD (gastroesophageal reflux disease)   . Arthritis   . H/O hiatal hernia    Past Surgical History:  Past Surgical History  Procedure Date  . Eye surgery   . Joint replacement     hip replacement  . Arthroplasty 03/09/2012    lt hip  . Total hip arthroplasty 03/09/2012    Procedure: TOTAL HIP ARTHROPLASTY;  Surgeon: Kennieth Rad, MD;  Location: [facility] OR;  Service: Orthopedics;  Laterality: Left;   Subjective Symptoms/Limitations Symptoms: PMH: see hx section.  Previous R THR 4-5 years ago Pertinent History: Pt is referred to PT secondary to L THR.  She reports that she is having a lot of difficulty with all of her housework, outdoor work, getting dressed.  Her c/co's are: generalized weakness and ambulating with an AD.  How long can you sit comfortably?: no difficulty.  How long can you stand comfortably?: 5 minutes  How long can you walk comfortably?: 5 minutes w/RW Pain Assessment Currently in Pain?: Yes Pain Score:   7 (last night.  Pain Range: 2-9/10. ) Pain Orientation: Left Pain Type: Acute pain;Surgical pain Pain Relieving Factors: Medication, ice Multiple Pain Sites: Yes  Precautions/Restrictions  Precautions Precautions: Posterior Hip Precaution Comments: NO BIKE  Prior Functioning    Home Living Lives With: Spouse Prior Function Level of Independence: Needs assistance with gait (SPC to walk) Able to Take Stairs?: Yes Driving: No Comments: She enjoys doing outdoor work and indoor housework  Cognition/Observation Observation/Other Assessments Observations: Decreased weight shift to LLE during standing activities Other Assessments: significant decrease in hamstring, hip flexor, quadricep, gastroc and soleus flexibility.  Incision is closed and healing well.  Sensation/Coordination/Flexibility/Functional Tests Functional Tests Functional Tests: 30 sec STS: 1x Functional Tests: ABC - 11.15%  Assessment LLE Strength Left Hip Flexion: 3+/5 Left Hip Extension: 3-/5 (taken in S/L position) Left Hip ABduction: 2-/5 Left Knee Flexion: 4/5 Left Knee Extension: 4/5 Palpation Palpation: pain and tenderness to posterior hip and gluteal region. with increased fascial restrictions and scar tissue   Mobility/Balance  Ambulation/Gait Ambulation/Gait: Yes Assistive device: Rolling walker Gait Pattern: Decreased weight shift to left;Decreased hip/knee flexion - right;Trunk flexed (60 degrees trunk flexion; downward gaze) Gait velocity: decreased Posture/Postural Control Posture/Postural Control: Postural limitations Postural Limitations: Trunk flexion of 55 degrees while standing, 60 degrees with ambulation w/RW Static Standing Balance Static Standing - Comment/# of Minutes: Each position held for a max of 10 sec.  Pt requires Max HHA with transitional LE movments. Single Leg Stance - Right Leg: 0  Single Leg Stance - Left Leg: 0  Tandem Stance - Right Leg: 5  Tandem Stance - Left Leg: 5  Rhomberg - Eyes Opened: 10  Rhomberg - Eyes Closed: 8  Timed Up and Go Test TUG:  Normal TUG Normal TUG (seconds): 42  (w/RW)   Exercise/Treatments Standing Heel Raises: 10 reps;Limitations;Other (comment) (HEP) Heel Raises Limitations: Toe Raises x10 (HEP) Supine Bridges: 10  reps (HEP) Straight Leg Raises: Left;10 reps Sidelying Hip ABduction: Left;10 reps (HEP)  Physical Therapy Assessment and Plan PT Assessment and Plan Clinical Impression Statement: Pt is a 71 year old female referred to PT s/p L THR w/hx of R THR.  After evaluation it was found she is greatly limited by increased hip pain which is increasing her trunk flexion with static and dynamic activities.  Pt was able to independently report her posterior hip precautions. Pt will benefit from skilled therapeutic intervention in order to improve on the following deficits: Abnormal gait;Difficulty walking;Decreased strength;Increased fascial restricitons;Increased muscle spasms;Pain;Impaired perceived functional ability;Impaired flexibility;Decreased range of motion;Decreased mobility;Decreased balance Rehab Potential: Good PT Frequency: Min 3X/week PT Duration: 8 weeks (MD ordered 3 wks, recommend 8 weeks: pt goal: Ind. amb. ) PT Treatment/Interventions: DME instruction;Gait training;Stair training;Functional mobility training;Therapeutic activities;Therapeutic exercise;Balance training;Neuromuscular re-education;Patient/family education;Manual techniques;Modalities PT Plan: General LE strengthening (squats, standing hip abd, extension, knee flexion, heel and toe raises).  Gait training w/RW w/cueing for posture    Goals Home Exercise Program Pt will Perform Home Exercise Program: Independently PT Goal: Perform Home Exercise Program - Progress: Goal set today PT Short Term Goals Time to Complete Short Term Goals: 4 weeks PT Short Term Goal 1: Pt will improve LE strength in order to ambulate with a SPC x15 minutes  PT Short Term Goal 2: Pt will improve her LE functional strength and demonstrate 5 STS w/o UE support in 30 sec.  PT Short Term Goal 3: Pt will improve LE strength by 1 muscle grade.  PT Short Term Goal 4: Pt will report pain less than 3/10 for 75% of her day.  PT Short Term Goal 5: Pt will  demonstrate independent R and L staggard stance x10 sec on solid surface  PT Long Term Goals Time to Complete Long Term Goals: 8 weeks PT Long Term Goal 1: Pt will improve LE strength in order to ambulate independently x10 minutes in a closed environement. PT Long Term Goal 2: Pt will demonstrate mod I outdoor ambulation w/LRAD in order to attend safely to outdoor activities.  Long Term Goal 3: Pt will improve her ABC to greater than 30% for improved percieved functional ability. Long Term Goal 4: Pt will stand with less than or equal to 15 degrees of trunk flexion to decrease risk of secondary injuries.  Problem List Patient Active Problem List  Diagnosis  . Abnormality of gait  . Difficulty in walking  . Hip pain  . Hip stiffness   GP Functional Assessment Tool Used: ABC Functional Limitation: Mobility: Walking and moving around Mobility: Walking and Moving Around Current Status ([id number]): At least 80 percent but less than 100 percent impaired, limited or restricted Mobility: Walking and Moving Around Goal Status [phone]): At least 40 percent but less than 60 percent impaired, limited or restricted  Beatrice Sehgal, PT 04/19/2012, 3:59 PM  Physician Documentation Your signature is required to indicate approval of the treatment plan as stated above.  Please sign and either send electronically or make a copy of this report for your files and return this physician signed original.   Please mark one 1.__approve of plan  2. ___approve of plan with the following conditions.   ______________________________  _____________________ Physician Signature                                                                                                             Date

## 2012-04-25 ENCOUNTER — Ambulatory Visit (HOSPITAL_COMMUNITY): Payer: Medicare Other | Admitting: Physical Therapy

## 2012-04-27 ENCOUNTER — Ambulatory Visit (HOSPITAL_COMMUNITY)
Admission: RE | Admit: 2012-04-27 | Discharge: 2012-04-27 | Disposition: A | Discharge status: Home / Self Care | Payer: Medicare Other | Source: Ambulatory Visit | Attending: Orthopedic Surgery | Admitting: Orthopedic Surgery

## 2012-04-27 NOTE — Progress Notes (Signed)
Physical Therapy Treatment Patient Details  Name: Brenda Kerr MRN: [id number] Date of Birth: May 14, 1941  Today's Date: 04/27/2012 Time: 4098-1191 PT Time Calculation (min): 38 min Charges: 28' TE, 1 ice Visit#: 2  of 10   Re-eval: 05/19/12    Authorization: MEDICARE  Authorization Time Period: G-code: mobility: Current: CM Goal: CK  Authorization Visit#: 2  of 10    Subjective: Symptoms/Limitations Symptoms: Pt reports that she missed her apt on monday due to a virus.  She is doing some of her exercises at home.  Pain Assessment Currently in Pain?: Yes Pain Score:   7 Pain Location: Hip Pain Orientation: Left  Precautions/Restrictions  Precautions Precautions: Posterior Hip Precaution Comments: NO BIKE  Exercise/Treatments Stretches Hip Flexor Stretch: 3 reps;30 seconds (BLE) Standing Heel Raises: 20 reps Heel Raises Limitations: Toe Raises x20 Gait Training: 6' walk test: 372 ft w/RW and cueing for posture and heel strike.  Other Standing Knee Exercises: back bends x10, shoulder flexion and cervical extension x10 Seated Other Seated Knee Exercises: 5 STS w/o UE A   Modalities Modalities: Cryotherapy Cryotherapy Number Minutes Cryotherapy: 10 Minutes Cryotherapy Location: Hip Type of Cryotherapy: Ice pack  Physical Therapy Assessment and Plan PT Assessment and Plan Clinical Impression Statement: Pt has decreased pain after ther-ex today and even more after ice.  Concentrated on improving pt posture.  Educated pt to continue with posture techniques and when she has decreased trunk flexion, we can move to [facility].  PT Duration:  (MD ordered 3 wks, recommend 8 weeks: pt goal: Ind. amb. ) PT Plan: General LE strengthening (squats, standing hip abd, extension, knee flexion, heel and toe raises).  Gait training w/RW w/cueing for posture    Goals    Problem List Patient Active Problem List  Diagnosis  . Abnormality of gait  . Difficulty in walking  . Hip pain    . Hip stiffness    PT Plan of Care PT Home Exercise Plan: see scanned report PT Patient Instructions: Educated on importance of posture and to contiue working on it at home.  Consulted and Agree with Plan of Care: Patient  Annett Fabian, PT 04/27/2012, 4:17 PM

## 2012-04-29 ENCOUNTER — Ambulatory Visit (HOSPITAL_COMMUNITY)
Admission: RE | Admit: 2012-04-29 | Discharge: 2012-04-29 | Disposition: A | Discharge status: Home / Self Care | Payer: Medicare Other | Source: Ambulatory Visit | Attending: Family Medicine | Admitting: Family Medicine

## 2012-04-29 NOTE — Progress Notes (Signed)
Physical Therapy Treatment Patient Details  Name: Brenda Kerr MRN: [id number] Date of Birth: November 25, 1940  Today's Date: 04/29/2012 Time: 4098-1191 PT Time Calculation (min): 39 min Charges: 14' TE, 25' Gait Visit#: 3  of 10   Re-eval: 05/19/12    Authorization: MEDICARE  Authorization Time Period: G-code: mobility: Current: CM Goal: CK  Authorization Visit#: 3  of 10    Subjective: Symptoms/Limitations Symptoms: Pt reports that she has been doing well and has some soreness from doing her exercises.   Precautions/Restrictions     Exercise/Treatments Standing Gait Training: 6' walking w/RW for activity tolerance.  SPC: 4' to complete 1 RT around gym w/2 RB for gait training, RW at end of session for gait training x100 feet Other Standing Knee Exercises: back bends x10, shoulder flexion and cervical extension x10 Seated Other Seated Knee Exercises: Dyna Disc Sits: AP x10 w/VC's and TC's for proper movement x10, S<>S x20, thoracic rotation x20 each direction    Physical Therapy Assessment and Plan PT Assessment and Plan Clinical Impression Statement: Pt able to ambulate with SPC today 1x around gym with significant fatigue.  Has improvements in posture with RW by end of treatment. Added dyna disc activities to improve pelvic mobility.  PT Duration:  (MD ordered 3 wks, recommend 8 weeks: pt goal: Ind. amb. ) PT Plan: Gait training w/RW w/cueing for posture and progress to [facility].  Contine to work on exercises to improve LE strength and enocourage posture    Goals    Problem List Patient Active Problem List  Diagnosis  . Abnormality of gait  . Difficulty in walking  . Hip pain  . Hip stiffness    PT Plan of Care PT Home Exercise Plan: see scanned report PT Patient Instructions: Educated on importance of posture and to contiue working on it at home.  Consulted and Agree with Plan of Care: Patient  Annett Fabian, PT 04/29/2012, 2:35 PM

## 2012-05-04 ENCOUNTER — Ambulatory Visit (HOSPITAL_COMMUNITY)
Admission: RE | Admit: 2012-05-04 | Discharge: 2012-05-04 | Disposition: A | Discharge status: Home / Self Care | Payer: Medicare Other | Source: Ambulatory Visit | Attending: Family Medicine | Admitting: Family Medicine

## 2012-05-04 DIAGNOSIS — R269 Unspecified abnormalities of gait and mobility: Secondary | ICD-10-CM | POA: Diagnosis not present

## 2012-05-04 DIAGNOSIS — M25559 Pain in unspecified hip: Secondary | ICD-10-CM | POA: Diagnosis not present

## 2012-05-04 DIAGNOSIS — IMO0001 Reserved for inherently not codable concepts without codable children: Secondary | ICD-10-CM | POA: Insufficient documentation

## 2012-05-04 DIAGNOSIS — M25659 Stiffness of unspecified hip, not elsewhere classified: Secondary | ICD-10-CM | POA: Diagnosis not present

## 2012-05-04 DIAGNOSIS — M6281 Muscle weakness (generalized): Secondary | ICD-10-CM | POA: Diagnosis not present

## 2012-05-04 NOTE — Evaluation (Addendum)
Physical Therapy Progress Note for MD Patient Details  Name: Brenda Kerr MRN: [id number] Date of Birth: 1941-02-14  Today's Date: 05/04/2012 Time: 4098-1191 PT Time Calculation (min): 38 min Charges: 1 ROM, manual x15', TE x15' Visit#: 4  of 10   Re-eval:   Assessment Diagnosis: L THR Surgical Date: 03/09/12 Next MD Visit: Dr. Montez Morita- Next week  Authorization: Medicare  Authorization Time Period:    Authorization Visit#: 4  of 10    Subjective Symptoms/Limitations Symptoms: Pt reports that she continues to have some soreness, but feels she is doing better.    Precautions/Restrictions  Precautions Precautions: Posterior Hip Precaution Comments: NO BIKE  Sensation/Coordination/Flexibility/Functional Tests Functional Tests Functional Tests: 30 sec STS: 5x (was 1x)  Assessment LLE Strength Left Hip Flexion: 3+/5 (was 3+/5) Left Hip Extension: 3-/5 Left Hip ABduction: 2/5 (was ) Left Knee Flexion: 2/5 (was 4/5+) Left Knee Extension: 4/5 (was 2-/5)  Exercise/Treatments Mobility/Balance  Ambulation/Gait Ambulation/Gait: Yes Assistive device: Rolling walker Gait Pattern: Decreased weight shift to left;Decreased hip/knee flexion - right;Trunk flexed Gait velocity: has improved velocity and pt is now walking inside of her RW Posture/Postural Control Postural Limitations: Trunk flexion in standing 30 degrees (Trunk flexion of 55 degrees while standing, 60 degrees with ) Timed Up and Go Test TUG: Normal TUG Normal TUG (seconds): 36  (36 sec w/ RW, 40 sec w/SPC (was 42 seconds w/ RW))   Stretches Hip Flexor Stretch: 2 reps;30 seconds (BLE) Standing Heel Raises: 20 reps Functional Squat: 10 reps Gait Training: 2' gait training at beginning of session Hip Abduction x10  Seated Other Seated Knee Exercises: Iso hip adduction 10x10 sec holds  Manual Therapy Manual Therapy: Massage Massage: STM to Lt hip w/pt in R S/L position. x15 minutes to decrease pain.    Physical Therapy Assessment and Plan PT Assessment and Plan Clinical Impression Statement: Brenda Kerr has attended 4 sessions of PT.  She has made small but significant progress in her overall gait and activity tolerance.  However continues to have greatest limitation with her overall posture and increase pain to her hip which was reduced after manual technqiues today.  PT Plan: Continue to improve LE strength, gait mechanics and decrease pain.  F/U on STM.     Goals Home Exercise Program Pt will Perform Home Exercise Program: Independently PT Goal: Perform Home Exercise Program - Progress: Partly met PT Short Term Goals Time to Complete Short Term Goals: 4 weeks PT Short Term Goal 1: Pt will improve LE strength in order to ambulate with a SPC x15 minutes  PT Short Term Goal 1 - Progress: Progressing toward goal PT Short Term Goal 2: Pt will improve her LE functional strength and demonstrate 5 STS w/o UE support in 30 sec.  PT Short Term Goal 2 - Progress: Met PT Short Term Goal 3: Pt will improve LE strength by 1 muscle grade.  PT Short Term Goal 3 - Progress: Progressing toward goal PT Short Term Goal 4: Pt will report pain less than 3/10 for 75% of her day.  PT Short Term Goal 4 - Progress: Progressing toward goal (Average pain 5/10) PT Short Term Goal 5: Pt will demonstrate independent R and L staggard stance x10 sec on solid surface  PT Long Term Goals Time to Complete Long Term Goals: 8 weeks PT Long Term Goal 1: Pt will improve LE strength in order to ambulate independently x10 minutes in a closed environement. PT Long Term Goal 1 - Progress: Progressing toward goal  PT Long Term Goal 2: Pt will demonstrate mod I outdoor ambulation w/LRAD in order to attend safely to outdoor activities.  PT Long Term Goal 2 - Progress: Progressing toward goal Long Term Goal 3: Pt will improve her ABC to greater than 30% for improved percieved functional ability. Long Term Goal 3 Progress:  Progressing toward goal Long Term Goal 4: Pt will stand with less than or equal to 15 degrees of trunk flexion to decrease risk of secondary injuries. Long Term Goal 4 Progress: Progressing toward goal (30 degrees of trunk flexion)  Problem List Patient Active Problem List  Diagnosis  . Abnormality of gait  . Difficulty in walking  . Hip pain  . Hip stiffness   Carlene Bickley, PT 05/04/2012, 5:51 PM  Physician Documentation Your signature is required to indicate approval of the treatment plan as stated above.  Please sign and either send electronically or make a copy of this report for your files and return this physician signed original.   Please mark one 1.__approve of plan  2. ___approve of plan with the following conditions.   ______________________________                                                          _____________________ Physician Signature                                                                                                             Date

## 2012-05-06 ENCOUNTER — Ambulatory Visit (HOSPITAL_COMMUNITY): Payer: Medicare Other | Admitting: Physical Therapy

## 2012-05-09 ENCOUNTER — Ambulatory Visit (HOSPITAL_COMMUNITY)
Admission: RE | Admit: 2012-05-09 | Discharge: 2012-05-09 | Disposition: A | Discharge status: Home / Self Care | Payer: Medicare Other | Source: Ambulatory Visit | Attending: Family Medicine | Admitting: Family Medicine

## 2012-05-09 NOTE — Progress Notes (Signed)
Physical Therapy Treatment Patient Details  Name: Brenda Kerr MRN: [id number] Date of Birth: August 31, 1941  Today's Date: 05/09/2012 Time: 1522-1600 PT Time Calculation (min): 38 min  Visit#: 5  of 10   Re-eval: 05/19/12 Assessment Diagnosis: L THR Surgical Date: 03/09/12 Next MD Visit: Dr. Montez Morita- Next week Charges:  therex 25', ultrasound 8' Authorization: Medicare  Authorization Visit#: 5  of 10    Subjective: Symptoms/Limitations Symptoms: Pt. reports her pain is 6/10 today.   States she's had a sinus infection and has been feeling a little weak and dizzy. Pain Assessment Currently in Pain?: Yes Pain Score:   6 Pain Location: Hip Pain Orientation: Left  Precautions/Restrictions  Precautions Precautions: Posterior Hip Precaution Comments: NO BIKE  Exercise/Treatments Stretches Hip Flexor Stretch: 2 reps;30 seconds;Limitations Hip Flexor Stretch Limitations: standing Bilateral Standing Heel Raises: 10 reps Heel Raises Limitations: Toe Raises x10 Functional Squat: 10 reps Other Standing Knee Exercises: back bends x10, shoulder flexion and cervical extension x10 Seated Other Seated Knee Exercises: Iso hip adduction 10x10 sec holds   Modalities Modalities: Ultrasound Ultrasound Ultrasound Location: L buttock, medial and inferior to scar Ultrasound Parameters: 1.5w/cm2 X 8' with 1 MHz Ultrasound Goals: Pain  Physical Therapy Assessment and Plan PT Assessment and Plan Clinical Impression Statement: Pt. continues to require postural cues with gait.  Overall improvement following ultrasound today with reported pain reduction. Requires tactile and verbal cues to perform exercises correctly. PT Plan: Continue to improve LE strength, gait mechanics and decrease pain.  F/U on ultrasound vs. STM next visit.     Problem List Patient Active Problem List  Diagnosis  . Abnormality of gait  . Difficulty in walking  . Hip pain  . Hip stiffness    PT - End of  Session Activity Tolerance: Patient tolerated treatment well General Behavior During Session: [facility] for tasks performed   Lurena Nida, PTA/CLT 05/09/2012, 4:17 PM

## 2012-05-11 ENCOUNTER — Ambulatory Visit (HOSPITAL_COMMUNITY)
Admission: RE | Admit: 2012-05-11 | Discharge: 2012-05-11 | Disposition: A | Discharge status: Home / Self Care | Payer: Medicare Other | Source: Ambulatory Visit | Attending: Family Medicine | Admitting: Family Medicine

## 2012-05-11 NOTE — Progress Notes (Addendum)
Physical Therapy Treatment Patient Details  Name: Brenda Kerr MRN: [id number] Date of Birth: 19-Oct-1940  Today's Date: 05/11/2012 Time: 2841-3244 PT Time Calculation (min): 35 min Charges:  therex 24', ultrasound 8' Visit#: 6  of 10   Re-eval: 05/19/12    Authorization: medicare  Authorization Time Period:    Authorization Visit#: 6  of 10    Subjective: Symptoms/Limitations Symptoms: Pt. was 10' late for appt.  Pt. states the ultrasound really helped last visit and currently has no pain, only soreness. Pt. is now taking sinus medication that is helping with her dizziness Pain Assessment Currently in Pain?: No/denies  Exercise/Treatments Standing Heel Raises: 20 reps Heel Raises Limitations: Toe Raises x20 Functional Squat: 15 reps Gait Training: Gait at beginning and end of session Other Standing Knee Exercises: back bends x10, shoulder flexion and cervical extension x10 Seated Other Seated Knee Exercises: Iso hip adduction 10x10 sec holds Other Seated Knee Exercises: Hip flexion with tall sitting 10 reps each   Modalities Modalities: Ultrasound Ultrasound Ultrasound Location: L buttock, medial and inferior to scar  Ultrasound Parameters: 1.5w/cm2 X 8' with 1 MHz  Ultrasound Goals: Pain  Physical Therapy Assessment and Plan PT Assessment and Plan Clinical Impression Statement: Good results with ultrasound.  Overall improvement and pain, posture and quality of ambulation.  Pt. required less cues to maintain stability and posture with therex today.  Added seated hip flexion to work on strength and stability. PT Plan: Add theraband postural 3 next visit.     Problem List Patient Active Problem List  Diagnosis  . Abnormality of gait  . Difficulty in walking  . Hip pain  . Hip stiffness    PT - End of Session Activity Tolerance: Patient tolerated treatment well General Behavior During Session: [facility] for tasks performed Cognition: [facility] for tasks  performed   Lurena Nida, PTA/CLT 05/11/2012, 3:24 PM

## 2012-05-13 ENCOUNTER — Ambulatory Visit (HOSPITAL_COMMUNITY)
Admission: RE | Admit: 2012-05-13 | Discharge: 2012-05-13 | Disposition: A | Discharge status: Home / Self Care | Payer: Medicare Other | Source: Ambulatory Visit | Attending: Family Medicine | Admitting: Family Medicine

## 2012-05-13 NOTE — Progress Notes (Signed)
Physical Therapy Treatment Patient Details  Name: EVALISE ABRUZZESE MRN: [id number] Date of Birth: 1941/03/15  Today's Date: 05/13/2012 Time: 7829-5621 PT Time Calculation (min): 43 min Charges: gait x15', TE x15, 8' [place] Visit#: 7  of 10   Re-eval: 05/19/12   Authorization: MEDICARE  Authorization Time Period: G-code: mobility: Current: CM Goal: CK  Authorization Visit#: 7  of 10    Subjective: Symptoms/Limitations Symptoms: Pt reports that she is doing pretty well and is able to sleep more comfortably on her side.   Precautions/Restrictions     Exercise/Treatments Stretches Hip Flexor Stretch: 2 reps;60 seconds (BLE) Standing Heel Raises: 20 reps Heel Raises Limitations: Toe Raises x20 Gait Training: Gait training x15 minutes (3RT) around gym w/SPC and 3 rest breaks. Cueing for posture and stride length Other Standing Knee Exercises: Hip ABD and Extension x15 each  Modalities Modalities: Ultrasound Ultrasound Ultrasound Location: L buttock, medial and superior to scar  Ultrasound Parameters: 1.2w/cm2  continous, 1 Mhz X 8' Ultrasound Goals: Pain  Physical Therapy Assessment and Plan PT Assessment and Plan Clinical Impression Statement: Pt continues to require constant cueing for proper posture and stride length.  Able to complete gait training and all mobility with SPC.  Continues to be limited by increased tightness and decreased strength to L hip PT Plan: re-eval in 2 visits,  Add t-band postural exercises    Goals    Problem List Patient Active Problem List  Diagnosis  . Abnormality of gait  . Difficulty in walking  . Hip pain  . Hip stiffness   Bernadetta Roell, PT 05/13/2012, 2:50 PM

## 2012-05-16 ENCOUNTER — Ambulatory Visit (HOSPITAL_COMMUNITY): Payer: Medicare Other | Admitting: Physical Therapy

## 2012-05-18 ENCOUNTER — Ambulatory Visit (HOSPITAL_COMMUNITY): Payer: Medicare Other | Admitting: Physical Therapy

## 2012-05-20 ENCOUNTER — Ambulatory Visit (HOSPITAL_COMMUNITY): Payer: Medicare Other

## 2012-05-23 ENCOUNTER — Ambulatory Visit (HOSPITAL_COMMUNITY): Payer: Medicare Other | Admitting: *Deleted

## 2012-05-24 DIAGNOSIS — E7800 Pure hypercholesterolemia, unspecified: Secondary | ICD-10-CM | POA: Diagnosis not present

## 2012-05-24 DIAGNOSIS — I10 Essential (primary) hypertension: Secondary | ICD-10-CM | POA: Diagnosis not present

## 2012-05-25 ENCOUNTER — Ambulatory Visit (HOSPITAL_COMMUNITY): Payer: Medicare Other

## 2012-05-25 DIAGNOSIS — I10 Essential (primary) hypertension: Secondary | ICD-10-CM | POA: Diagnosis not present

## 2012-05-27 ENCOUNTER — Ambulatory Visit (HOSPITAL_COMMUNITY): Payer: Medicare Other

## 2012-05-30 ENCOUNTER — Ambulatory Visit (HOSPITAL_COMMUNITY): Payer: Medicare Other | Admitting: Physical Therapy

## 2012-06-01 ENCOUNTER — Ambulatory Visit (HOSPITAL_COMMUNITY): Payer: Medicare Other | Admitting: Physical Therapy

## 2012-06-03 ENCOUNTER — Ambulatory Visit (HOSPITAL_COMMUNITY): Payer: Medicare Other | Admitting: Physical Therapy

## 2012-06-06 ENCOUNTER — Ambulatory Visit (HOSPITAL_COMMUNITY): Payer: Medicare Other | Admitting: Physical Therapy

## 2012-06-08 ENCOUNTER — Ambulatory Visit (HOSPITAL_COMMUNITY): Payer: Medicare Other | Admitting: Physical Therapy

## 2012-06-10 ENCOUNTER — Ambulatory Visit (HOSPITAL_COMMUNITY): Payer: Medicare Other

## 2012-06-10 DIAGNOSIS — L02419 Cutaneous abscess of limb, unspecified: Secondary | ICD-10-CM | POA: Diagnosis not present

## 2012-06-10 DIAGNOSIS — L03119 Cellulitis of unspecified part of limb: Secondary | ICD-10-CM | POA: Diagnosis not present

## 2012-07-25 DIAGNOSIS — J069 Acute upper respiratory infection, unspecified: Secondary | ICD-10-CM | POA: Diagnosis not present

## 2012-07-25 DIAGNOSIS — I10 Essential (primary) hypertension: Secondary | ICD-10-CM | POA: Diagnosis not present

## 2012-07-25 DIAGNOSIS — J029 Acute pharyngitis, unspecified: Secondary | ICD-10-CM | POA: Diagnosis not present

## 2012-08-20 DIAGNOSIS — IMO0001 Reserved for inherently not codable concepts without codable children: Secondary | ICD-10-CM | POA: Diagnosis not present

## 2012-09-05 DIAGNOSIS — M549 Dorsalgia, unspecified: Secondary | ICD-10-CM | POA: Diagnosis not present

## 2012-09-05 DIAGNOSIS — R079 Chest pain, unspecified: Secondary | ICD-10-CM | POA: Diagnosis not present

## 2012-11-24 DIAGNOSIS — E669 Obesity, unspecified: Secondary | ICD-10-CM | POA: Diagnosis not present

## 2012-11-24 DIAGNOSIS — I10 Essential (primary) hypertension: Secondary | ICD-10-CM | POA: Diagnosis not present

## 2013-02-28 DIAGNOSIS — E119 Type 2 diabetes mellitus without complications: Secondary | ICD-10-CM | POA: Diagnosis not present

## 2013-02-28 DIAGNOSIS — M25569 Pain in unspecified knee: Secondary | ICD-10-CM | POA: Diagnosis not present

## 2013-04-03 DIAGNOSIS — I10 Essential (primary) hypertension: Secondary | ICD-10-CM | POA: Diagnosis not present

## 2013-04-03 DIAGNOSIS — M1250 Traumatic arthropathy, unspecified site: Secondary | ICD-10-CM | POA: Diagnosis not present

## 2013-07-07 DIAGNOSIS — I519 Heart disease, unspecified: Secondary | ICD-10-CM | POA: Diagnosis not present

## 2013-07-07 DIAGNOSIS — I10 Essential (primary) hypertension: Secondary | ICD-10-CM | POA: Diagnosis not present

## 2013-07-07 DIAGNOSIS — M1250 Traumatic arthropathy, unspecified site: Secondary | ICD-10-CM | POA: Diagnosis not present

## 2013-07-07 DIAGNOSIS — E119 Type 2 diabetes mellitus without complications: Secondary | ICD-10-CM | POA: Diagnosis not present

## 2013-07-11 IMAGING — CR DG CHEST 1V PORT
1 series · 1 of 1 positions shown · non-contrast
Comparison: 03/25/2010; 03/28/2008

CLINICAL DATA: Shortness of breath, panic attack

PORTABLE CHEST - 1 VIEW

[view not recorded]
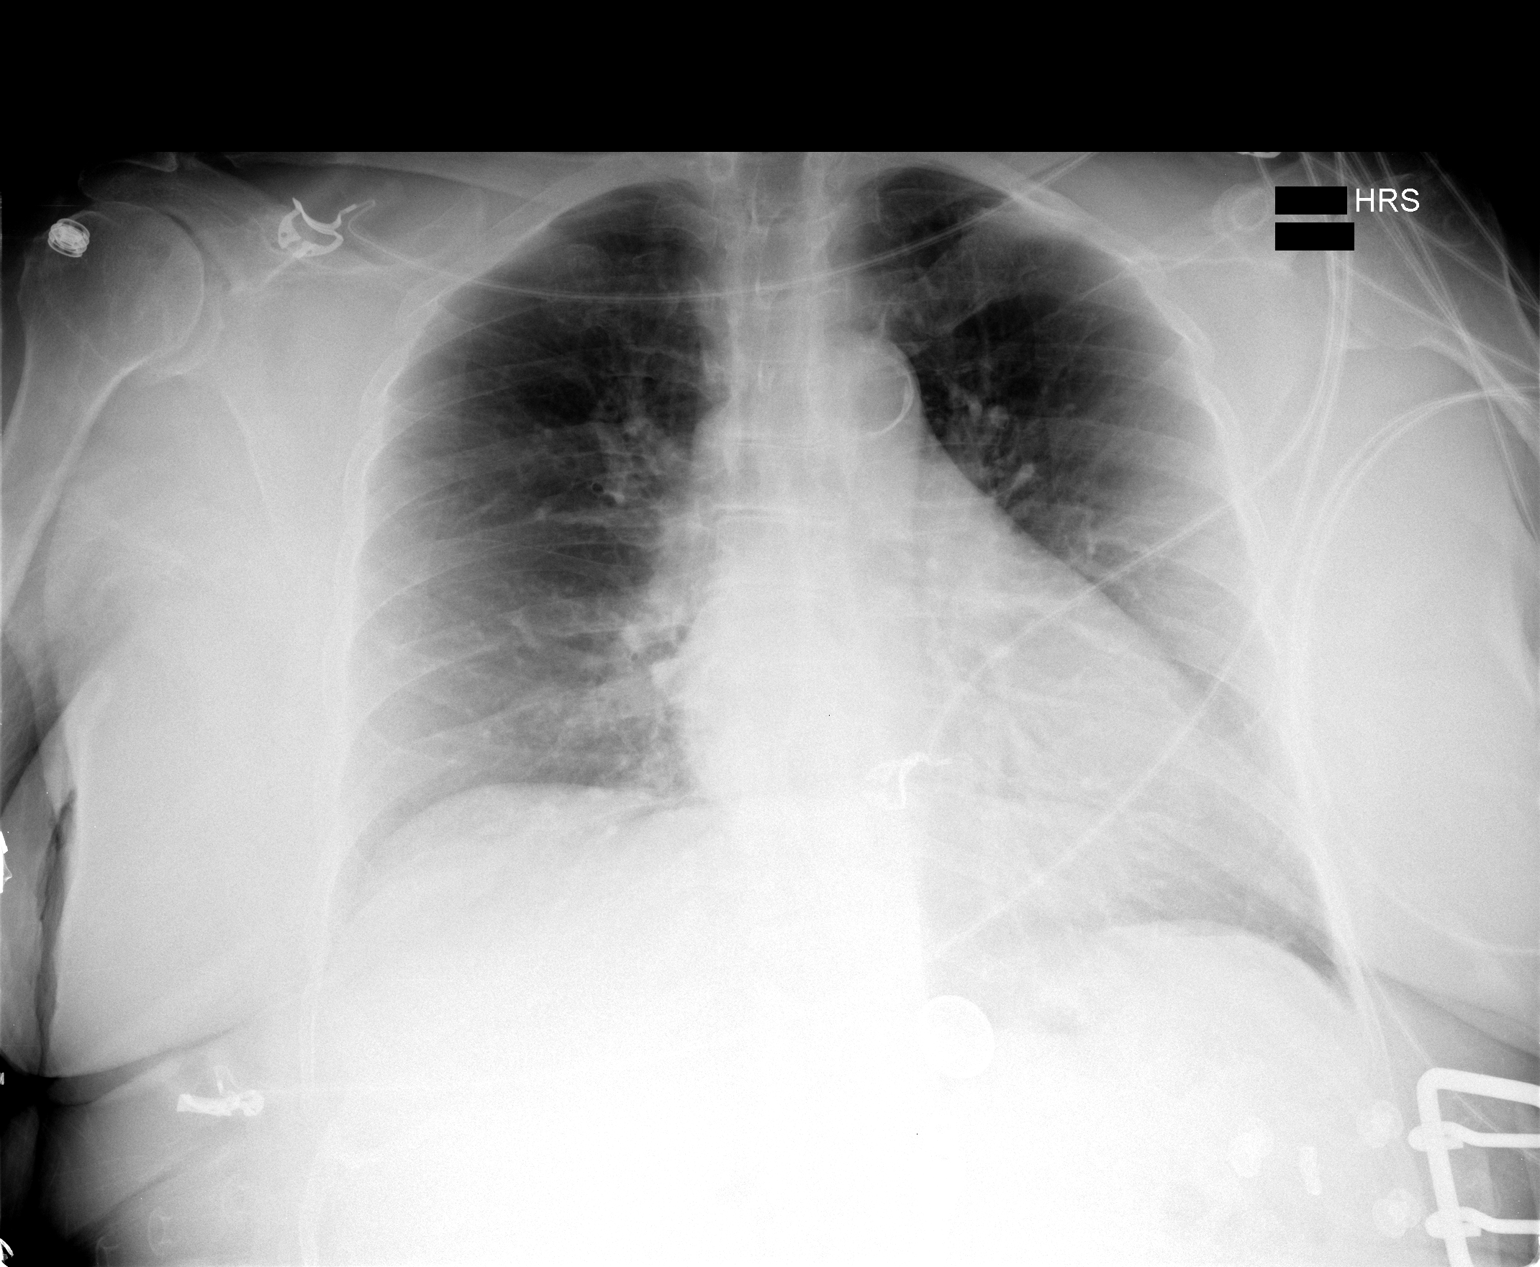

[1 of 1 positions shown; findings below may reference images not displayed]

FINDINGS: Examination minimally limited secondary to portable
technique and patient body habitus.  Grossly unchanged enlarged
cardiac silhouette and mediastinal contours with atherosclerotic
calcifications within the aortic arch.  There is persistent
elevation of the right hemidiaphragm.  Perihilar and bibasilar
heterogeneous opacities likely atelectasis.  No new focal
parenchymal opacities.  No definite pleural effusion or
pneumothorax.  Grossly unchanged bones.
IMPRESSION: No definite acute cardiopulmonary disease on this portable
examination.  Further evaluation may be performed with a PA and
lateral chest radiograph as clinically indicated.

## 2013-08-11 DIAGNOSIS — R252 Cramp and spasm: Secondary | ICD-10-CM | POA: Diagnosis not present

## 2013-08-11 DIAGNOSIS — I509 Heart failure, unspecified: Secondary | ICD-10-CM | POA: Diagnosis not present

## 2013-08-11 DIAGNOSIS — I10 Essential (primary) hypertension: Secondary | ICD-10-CM | POA: Diagnosis not present

## 2013-10-25 DIAGNOSIS — R05 Cough: Secondary | ICD-10-CM | POA: Diagnosis not present

## 2013-10-25 DIAGNOSIS — K5900 Constipation, unspecified: Secondary | ICD-10-CM | POA: Diagnosis not present

## 2013-10-25 DIAGNOSIS — R059 Cough, unspecified: Secondary | ICD-10-CM | POA: Diagnosis not present

## 2013-11-08 DIAGNOSIS — H4011X0 Primary open-angle glaucoma, stage unspecified: Secondary | ICD-10-CM | POA: Diagnosis not present

## 2013-11-15 DIAGNOSIS — M1250 Traumatic arthropathy, unspecified site: Secondary | ICD-10-CM | POA: Diagnosis not present

## 2013-11-15 DIAGNOSIS — I10 Essential (primary) hypertension: Secondary | ICD-10-CM | POA: Diagnosis not present

## 2013-12-14 DIAGNOSIS — H4011X0 Primary open-angle glaucoma, stage unspecified: Secondary | ICD-10-CM | POA: Diagnosis not present

## 2014-01-10 DIAGNOSIS — R1310 Dysphagia, unspecified: Secondary | ICD-10-CM | POA: Diagnosis not present

## 2014-02-20 DIAGNOSIS — I739 Peripheral vascular disease, unspecified: Secondary | ICD-10-CM | POA: Diagnosis not present

## 2014-02-20 DIAGNOSIS — R0989 Other specified symptoms and signs involving the circulatory and respiratory systems: Secondary | ICD-10-CM | POA: Diagnosis not present

## 2014-02-20 DIAGNOSIS — R0789 Other chest pain: Secondary | ICD-10-CM | POA: Diagnosis not present

## 2014-02-20 DIAGNOSIS — R002 Palpitations: Secondary | ICD-10-CM | POA: Diagnosis not present

## 2014-02-21 DIAGNOSIS — Z8679 Personal history of other diseases of the circulatory system: Secondary | ICD-10-CM | POA: Insufficient documentation

## 2014-02-21 DIAGNOSIS — M129 Arthropathy, unspecified: Secondary | ICD-10-CM | POA: Diagnosis not present

## 2014-02-21 DIAGNOSIS — I4949 Other premature depolarization: Secondary | ICD-10-CM | POA: Diagnosis not present

## 2014-02-21 DIAGNOSIS — H40009 Preglaucoma, unspecified, unspecified eye: Secondary | ICD-10-CM | POA: Insufficient documentation

## 2014-02-21 DIAGNOSIS — Z79899 Other long term (current) drug therapy: Secondary | ICD-10-CM | POA: Insufficient documentation

## 2014-02-21 DIAGNOSIS — E119 Type 2 diabetes mellitus without complications: Secondary | ICD-10-CM | POA: Insufficient documentation

## 2014-02-21 DIAGNOSIS — I10 Essential (primary) hypertension: Secondary | ICD-10-CM | POA: Insufficient documentation

## 2014-02-21 DIAGNOSIS — R0602 Shortness of breath: Secondary | ICD-10-CM | POA: Diagnosis not present

## 2014-02-21 NOTE — ED Notes (Signed)
Pt. reports SOB with bilateral leg weakness and hands " shaky" onset this evening . Denies chest pain , no cough or congestion .

## 2014-02-22 ENCOUNTER — Emergency Department (HOSPITAL_COMMUNITY): Payer: Medicare Other

## 2014-02-22 ENCOUNTER — Encounter (HOSPITAL_COMMUNITY): Payer: Self-pay | Admitting: Emergency Medicine

## 2014-02-22 ENCOUNTER — Emergency Department (HOSPITAL_COMMUNITY)
Admission: EM | Admit: 2014-02-22 | Discharge: 2014-02-22 | Disposition: A | Discharge status: Home / Self Care | Payer: Medicare Other | Attending: Emergency Medicine | Admitting: Emergency Medicine

## 2014-02-22 DIAGNOSIS — I493 Ventricular premature depolarization: Secondary | ICD-10-CM

## 2014-02-22 DIAGNOSIS — R079 Chest pain, unspecified: Secondary | ICD-10-CM | POA: Diagnosis not present

## 2014-02-22 DIAGNOSIS — R0602 Shortness of breath: Secondary | ICD-10-CM | POA: Diagnosis not present

## 2014-02-22 DIAGNOSIS — I10 Essential (primary) hypertension: Secondary | ICD-10-CM | POA: Diagnosis not present

## 2014-02-22 HISTORY — DX: Type 2 diabetes mellitus without complications: E11.9

## 2014-02-22 LAB — BASIC METABOLIC PANEL
BUN: 20 mg/dL (ref 6–23)
CO2: 24 mEq/L (ref 19–32)
CREATININE: 1.47 mg/dL — AB (ref 0.50–1.10)
Calcium: 9.5 mg/dL (ref 8.4–10.5)
Chloride: 101 mEq/L (ref 96–112)
GFR, EST AFRICAN AMERICAN: 40 mL/min — AB (ref >90)
GFR, EST NON AFRICAN AMERICAN: 34 mL/min — AB (ref >90)
Glucose, Bld: 108 mg/dL — ABNORMAL HIGH (ref 70–99)
Potassium: 3.9 mEq/L (ref 3.7–5.3)
Sodium: 140 mEq/L (ref 137–147)

## 2014-02-22 LAB — CBC
HEMATOCRIT: 38.5 % (ref 36.0–46.0)
Hemoglobin: 12.6 g/dL (ref 12.0–15.0)
MCH: 29 pg (ref 26.0–34.0)
MCHC: 32.7 g/dL (ref 30.0–36.0)
MCV: 88.7 fL (ref 78.0–100.0)
Platelets: 235 10*3/uL (ref 150–400)
RBC: 4.34 MIL/uL (ref 3.87–5.11)
RDW: 15.1 % (ref 11.5–15.5)
WBC: 8.4 10*3/uL (ref 4.0–10.5)

## 2014-02-22 LAB — PRO B NATRIURETIC PEPTIDE: Pro B Natriuretic peptide (BNP): 156.2 pg/mL — ABNORMAL HIGH (ref 0–125)

## 2014-02-22 LAB — TROPONIN I: Troponin I: <0.3 ng/mL (ref <0.30)

## 2014-02-22 NOTE — Discharge Instructions (Signed)
Keep your appointments this week for your cardiac workup.  If you feel shaky, try eating a little candy or drinking orange juice to see if this helps with the symptoms.  Your primary care doctor may want you to start checking your blood sugars regularly if this continues.     Premature Ventricular Contraction Premature ventricular contraction (PVC) is an irregularity of the heart rhythm involving extra or skipped heartbeats. In some cases, they may occur without obvious cause or heart disease. Other times, they can be caused by an electrolyte change in the blood. These need to be corrected. They can also be seen when there is not enough oxygen going to the heart. A common cause of this is plaque or cholesterol buildup. This buildup decreases the blood supply to the heart. In addition, extra beats may be caused or aggravated by:  Excessive smoking.  Alcohol consumption.  Caffeine.  Certain medications  Some street drugs. SYMPTOMS   The sensation of feeling your heart skipping a beat (palpitations).  In many cases, the person may have no symptoms. SIGNS AND TESTS   A physical examination may show an occasional irregularity, but if the PVC beats do not happen often, they may not be found on physical exam.  Blood pressure is usually normal.  Other tests that may find extra beats of the heart are:  An EKG (electrocardiogram)  A Holter monitor which can monitor your heart over longer periods of time  An Angiogram (study of the heart arteries). TREATMENT  Usually extra heartbeats do not need treatment. The condition is treated only if symptoms are severe or if extra beats are very frequent or are causing problems. An underlying cause, if discovered, may also require treatment.  Treatment may also be needed if there may be a risk for other more serious cardiac arrhythmias.  PREVENTION   Moderation in caffeine, alcohol, and tobacco use may reduce the risk of ectopic heartbeats in some  people.  Exercise often helps people who lead a sedentary (inactive) lifestyle. PROGNOSIS  PVC heartbeats are generally harmless and do not need treatment.  RISKS AND COMPLICATIONS   Ventricular tachycardia (occasionally).  There usually are no complications.  Other arrhythmias (occasionally). SEEK IMMEDIATE MEDICAL CARE IF:   You feel palpitations that are frequent or continual.  You develop chest pain or other problems such as shortness of breath, sweating, or nausea and vomiting.  You become light-headed or faint (pass out).  You get worse or do not improve with treatment. Document Released: 04/03/2004 Document Revised: 11/09/2011 Document Reviewed: 10/14/2007 [facility] Patient Information 2015 [place], [place]. This information is not intended to replace advice given to you by your health care provider. Make sure you discuss any questions you have with your health care provider.

## 2014-02-22 NOTE — ED Provider Notes (Signed)
CSN: [id number]     Arrival date & time 02/21/14  2349 History   First MD Initiated Contact with Patient 02/22/14 [phone]     Chief Complaint  Patient presents with  . Shortness of Breath     (Consider location/radiation/quality/duration/timing/severity/associated sxs/prior Treatment) HPI 73 yo female presents to the ER from home with complaint of shortness of breath and shakiness this evening.  Pt felt like her blood sugar might be low, and she drank orange juice.  Pt now back to normal.  Pt has h/o htn, DOE, DM.  Pt was seen earlier today by her new cardiologist, Dr Einar Gip.  She is scheduled for a ECHO tomorrow and stress test on Friday.  She was started on new BP medication.  Pt denies cp, current sob, abd pain, fever, dysuria, n/v/d.  Pt does not take medication for her diabetes. Past Medical History  Diagnosis Date  . Hypertension   . Glaucoma   . Dysrhythmia   . Shortness of breath     walk a long way  . Peripheral vascular disease   . GERD (gastroesophageal reflux disease)   . Arthritis   . H/O hiatal hernia   . Diabetes mellitus without complication    Past Surgical History  Procedure Laterality Date  . Eye surgery    . Joint replacement      hip replacement  . Arthroplasty  03/09/2012    lt hip  . 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 History  Substance Use Topics  . Smoking status: Never Smoker   . Smokeless tobacco: Never Used  . Alcohol Use: No   OB History   Grav Para Term Preterm Abortions TAB SAB Ect Mult Living                 Review of Systems  All other systems reviewed and are negative.     Allergies  Codeine  Home Medications   Prior to Admission medications   Medication Sig Start Date End Date Taking? Authorizing Provider  amLODipine (NORVASC) 10 MG tablet Take 10 mg by mouth daily.     Yes  Historical Provider, MD  aspirin 81 MG tablet Take 81 mg by mouth daily.     Yes Historical Provider, MD  atorvastatin (LIPITOR) 10 MG tablet Take 10 mg by mouth daily.   Yes Historical Provider, MD  bimatoprost (LUMIGAN) 0.01 % SOLN Place 1 drop into both eyes at bedtime.     Yes Historical Provider, MD  brimonidine-timolol (COMBIGAN) 0.2-0.5 % ophthalmic solution Place 1 drop into both eyes every 12 (twelve) hours.     Yes Historical Provider, MD  labetalol (NORMODYNE) 100 MG tablet Take 100 mg by mouth 2 (two) times daily.     Yes Historical Provider, MD  lisinopril-hydrochlorothiazide (PRINZIDE,ZESTORETIC) 20-25 MG per tablet Take 1 tablet by mouth daily.   Yes Historical Provider, MD  spironolactone (ALDACTONE) 25 MG tablet Take 25 mg by mouth 2 (two) times daily.   Yes Historical Provider, MD   BP 104/41  Pulse 61  Temp(Src) 97.7 F (36.5 C) (Oral)  Resp 18  SpO2 100% Physical Exam  Nursing note and vitals reviewed. Constitutional: She is oriented to person, place, and time. She appears well-developed and well-nourished. No distress.  HENT:  Head: Normocephalic and atraumatic.  Right Ear: External ear normal.  Left Ear: External ear normal.  Nose: Nose normal.  Mouth/Throat: Oropharynx is clear and moist.  Eyes: Conjunctivae and EOM are normal. Pupils are equal, round, and reactive to light.  Neck: Normal range of motion. Neck supple. No JVD present. No tracheal deviation present. No thyromegaly present.  Cardiovascular: Normal rate, regular rhythm, normal heart sounds and intact distal pulses.  Exam reveals no gallop and no friction rub.   No murmur heard. Pulmonary/Chest: Effort normal and breath sounds normal. No stridor. No respiratory distress. She has no wheezes. She has no rales. She exhibits no tenderness.  Abdominal: Soft. Bowel sounds are normal. She exhibits no distension and no mass. There is no tenderness. There is no rebound and no guarding.  Musculoskeletal: Normal  range of motion. She exhibits no edema and no tenderness.  Lymphadenopathy:    She has no cervical adenopathy.  Neurological: She is alert and oriented to person, place, and time. She has normal reflexes. No cranial nerve deficit. She exhibits normal muscle tone. Coordination normal.  Skin: Skin is warm and dry. No rash noted. No erythema. No pallor.  Psychiatric: She has a normal mood and affect. Her behavior is normal. Judgment and thought content normal.    ED Course  Procedures (including critical care time) Labs Review Labs Reviewed  BASIC METABOLIC PANEL - Abnormal; Notable for the following:    Glucose, Bld 108 (*)    Creatinine, Ser 1.47 (*)    GFR calc non Af Amer 34 (*)    GFR calc Af Amer 40 (*)    All other components within normal limits  PRO B NATRIURETIC PEPTIDE - Abnormal; Notable for the following:    Pro B Natriuretic peptide (BNP) 156.2 (*)    All other components within normal limits  CBC  TROPONIN I    Imaging Review Dg Chest 2 View  02/22/2014   CLINICAL DATA:  Shortness of breath  EXAM: CHEST  2 VIEW  COMPARISON:  Prior radiograph from 03/04/2012  FINDINGS: The cardiac and mediastinal silhouettes are stable in size and contour, and remain within normal limits. Atherosclerotic calcifications present within the aortic arch.  The lungs are normally inflated. No airspace consolidation, pleural effusion, or pulmonary edema is identified. There is no pneumothorax.  No acute osseous abnormality identified.  IMPRESSION: No active cardiopulmonary disease.   Electronically Signed   By: Jeannine Boga M.D.   On: 02/22/2014 00:57     EKG Interpretation   Date/Time:  Wednesday February 21 2014 23:57:58 EDT Ventricular Rate:  70 PR Interval:  160 QRS Duration: 100 QT Interval:  426 QTC Calculation: 460 R Axis:   78 Text Interpretation:  Sinus rhythm with frequent Premature ventricular  complexes Right atrial enlargement Borderline ECG frequent pvcs new from  prior  Confirmed by OTTER  MD, OLGA ([id number]) on 02/22/2014 4:01:38 AM      MDM   Final diagnoses:  Premature ventricular contractions    73 year old female with shaking of hands, palpitations or shortness of breath tonight.  Patient was seen by cardiology earlier today and started on Prinzide.  She has echo in the morning and stress test on Friday.  Workup here unremarkable aside from PVCs.  Blood sugar slightly low 106, patient reports that she did drink orange juice prior to arrival, may have been a hypoglycemic episode causing her symptoms.  Patient given instructions should this recur again, encouraged followup with her primary care physician Return precautions given.   Kalman Drape, MD 02/23/14 [phone]

## 2014-02-22 NOTE — ED Notes (Signed)
Pt ambulating independently w/ steady gait on d/c in no acute distress, A&Ox4.D/c instructions reviewed w/ pt and family - pt and family deny any further questions or concerns at present.  

## 2014-03-13 DIAGNOSIS — I10 Essential (primary) hypertension: Secondary | ICD-10-CM | POA: Diagnosis not present

## 2014-03-13 DIAGNOSIS — E7800 Pure hypercholesterolemia, unspecified: Secondary | ICD-10-CM | POA: Diagnosis not present

## 2014-03-13 DIAGNOSIS — E119 Type 2 diabetes mellitus without complications: Secondary | ICD-10-CM | POA: Diagnosis not present

## 2014-03-14 DIAGNOSIS — I739 Peripheral vascular disease, unspecified: Secondary | ICD-10-CM | POA: Diagnosis not present

## 2014-03-14 DIAGNOSIS — R0789 Other chest pain: Secondary | ICD-10-CM | POA: Diagnosis not present

## 2014-03-14 DIAGNOSIS — I10 Essential (primary) hypertension: Secondary | ICD-10-CM | POA: Diagnosis not present

## 2014-03-14 DIAGNOSIS — E785 Hyperlipidemia, unspecified: Secondary | ICD-10-CM | POA: Diagnosis not present

## 2014-03-23 DIAGNOSIS — R079 Chest pain, unspecified: Secondary | ICD-10-CM | POA: Diagnosis not present

## 2014-03-23 DIAGNOSIS — I10 Essential (primary) hypertension: Secondary | ICD-10-CM | POA: Diagnosis not present

## 2014-04-02 DIAGNOSIS — R0989 Other specified symptoms and signs involving the circulatory and respiratory systems: Secondary | ICD-10-CM | POA: Diagnosis not present

## 2014-04-12 DIAGNOSIS — K5900 Constipation, unspecified: Secondary | ICD-10-CM | POA: Diagnosis not present

## 2014-04-12 DIAGNOSIS — R141 Gas pain: Secondary | ICD-10-CM | POA: Diagnosis not present

## 2014-04-12 DIAGNOSIS — R142 Eructation: Secondary | ICD-10-CM | POA: Diagnosis not present

## 2014-04-12 DIAGNOSIS — Z1211 Encounter for screening for malignant neoplasm of colon: Secondary | ICD-10-CM | POA: Diagnosis not present

## 2014-04-12 DIAGNOSIS — R143 Flatulence: Secondary | ICD-10-CM | POA: Diagnosis not present

## 2014-04-30 DIAGNOSIS — I10 Essential (primary) hypertension: Secondary | ICD-10-CM | POA: Diagnosis not present

## 2014-04-30 DIAGNOSIS — R0989 Other specified symptoms and signs involving the circulatory and respiratory systems: Secondary | ICD-10-CM | POA: Diagnosis not present

## 2014-04-30 DIAGNOSIS — I209 Angina pectoris, unspecified: Secondary | ICD-10-CM | POA: Diagnosis not present

## 2014-04-30 DIAGNOSIS — E785 Hyperlipidemia, unspecified: Secondary | ICD-10-CM | POA: Diagnosis not present

## 2014-05-23 DIAGNOSIS — I209 Angina pectoris, unspecified: Secondary | ICD-10-CM | POA: Diagnosis not present

## 2014-05-23 DIAGNOSIS — I739 Peripheral vascular disease, unspecified: Secondary | ICD-10-CM | POA: Diagnosis not present

## 2014-05-23 DIAGNOSIS — I7389 Other specified peripheral vascular diseases: Secondary | ICD-10-CM | POA: Diagnosis not present

## 2014-05-23 DIAGNOSIS — R0789 Other chest pain: Secondary | ICD-10-CM | POA: Diagnosis not present

## 2014-05-29 ENCOUNTER — Ambulatory Visit (HOSPITAL_COMMUNITY)
Admission: RE | Admit: 2014-05-29 | Discharge: 2014-05-29 | Disposition: A | Discharge status: Home / Self Care | Payer: Medicare Other | Source: Ambulatory Visit | Attending: Cardiology | Admitting: Cardiology

## 2014-05-29 ENCOUNTER — Encounter (HOSPITAL_COMMUNITY)
Admission: RE | Disposition: A | Discharge status: Home / Self Care | Payer: Self-pay | Source: Ambulatory Visit | Attending: Cardiology

## 2014-05-29 DIAGNOSIS — G4733 Obstructive sleep apnea (adult) (pediatric): Secondary | ICD-10-CM | POA: Diagnosis not present

## 2014-05-29 DIAGNOSIS — E785 Hyperlipidemia, unspecified: Secondary | ICD-10-CM | POA: Diagnosis not present

## 2014-05-29 DIAGNOSIS — I10 Essential (primary) hypertension: Secondary | ICD-10-CM | POA: Diagnosis not present

## 2014-05-29 DIAGNOSIS — R0789 Other chest pain: Secondary | ICD-10-CM | POA: Insufficient documentation

## 2014-05-29 DIAGNOSIS — I6529 Occlusion and stenosis of unspecified carotid artery: Secondary | ICD-10-CM | POA: Insufficient documentation

## 2014-05-29 DIAGNOSIS — I658 Occlusion and stenosis of other precerebral arteries: Secondary | ICD-10-CM | POA: Diagnosis not present

## 2014-05-29 DIAGNOSIS — M129 Arthropathy, unspecified: Secondary | ICD-10-CM | POA: Insufficient documentation

## 2014-05-29 DIAGNOSIS — R7309 Other abnormal glucose: Secondary | ICD-10-CM | POA: Diagnosis not present

## 2014-05-29 DIAGNOSIS — R079 Chest pain, unspecified: Secondary | ICD-10-CM | POA: Diagnosis not present

## 2014-05-29 DIAGNOSIS — R9439 Abnormal result of other cardiovascular function study: Secondary | ICD-10-CM | POA: Diagnosis not present

## 2014-05-29 HISTORY — PX: LEFT HEART CATHETERIZATION WITH CORONARY ANGIOGRAM: SHX5451

## 2014-05-29 LAB — GLUCOSE, CAPILLARY: Glucose-Capillary: 102 mg/dL — ABNORMAL HIGH (ref 70–99)

## 2014-05-29 SURGERY — LEFT HEART CATHETERIZATION WITH CORONARY ANGIOGRAM
Anesthesia: LOCAL

## 2014-05-29 MED ORDER — SODIUM CHLORIDE 0.9 % IJ SOLN: 3.0000 mL | INTRAMUSCULAR | Status: DC | PRN

## 2014-05-29 MED ORDER — SODIUM CHLORIDE 0.9 % IJ SOLN: 3.0000 mL | Freq: Two times a day (BID) | INTRAMUSCULAR | Status: DC

## 2014-05-29 MED ORDER — VERAPAMIL HCL 2.5 MG/ML IV SOLN
INTRAVENOUS | Status: AC
  Filled 2014-05-29: qty 2

## 2014-05-29 MED ORDER — HEPARIN (PORCINE) IN NACL 2-0.9 UNIT/ML-% IJ SOLN
INTRAMUSCULAR | Status: AC
  Filled 2014-05-29: qty 1000

## 2014-05-29 MED ORDER — ASPIRIN 81 MG PO CHEW
81.0000 mg | CHEWABLE_TABLET | ORAL | Status: AC
  Administered 2014-05-29: 81 mg via ORAL

## 2014-05-29 MED ORDER — LIDOCAINE HCL (PF) 1 % IJ SOLN
INTRAMUSCULAR | Status: AC
  Filled 2014-05-29: qty 30

## 2014-05-29 MED ORDER — ASPIRIN 81 MG PO CHEW
CHEWABLE_TABLET | ORAL | Status: AC
  Filled 2014-05-29: qty 1

## 2014-05-29 MED ORDER — FENTANYL CITRATE 0.05 MG/ML IJ SOLN
INTRAMUSCULAR | Status: AC
  Filled 2014-05-29: qty 2

## 2014-05-29 MED ORDER — HEPARIN SODIUM (PORCINE) 1000 UNIT/ML IJ SOLN
INTRAMUSCULAR | Status: AC
  Filled 2014-05-29: qty 1

## 2014-05-29 MED ORDER — MIDAZOLAM HCL 2 MG/2ML IJ SOLN
INTRAMUSCULAR | Status: AC
  Filled 2014-05-29: qty 2

## 2014-05-29 MED ORDER — SODIUM CHLORIDE 0.9 % IV SOLN: INTRAVENOUS | Status: DC

## 2014-05-29 MED ORDER — SODIUM CHLORIDE 0.9 % IV SOLN: 250.0000 mL | INTRAVENOUS | Status: DC | PRN

## 2014-05-29 MED ORDER — NITROGLYCERIN 1 MG/10 ML FOR IR/CATH LAB
INTRA_ARTERIAL | Status: AC
  Filled 2014-05-29: qty 10

## 2014-05-29 MED ORDER — SODIUM CHLORIDE 0.9 % IV SOLN: 1.0000 mL/kg/h | INTRAVENOUS | Status: DC

## 2014-05-29 MED ORDER — SODIUM CHLORIDE 0.9 % IV BOLUS (SEPSIS)
500.0000 mL | Freq: Once | INTRAVENOUS | Status: AC
  Administered 2014-05-29: 07:00:00 via INTRAVENOUS

## 2014-05-29 NOTE — H&P (Signed)
  Please see office visit notes for complete details of HPI.  

## 2014-05-29 NOTE — Interval H&P Note (Signed)
History and Physical Interval Note:  05/29/2014 7:51 AM  Brenda Kerr  has presented today for surgery, with the diagnosis of cp  The various methods of treatment have been discussed with the patient and family. After consideration of risks, benefits and other options for treatment, the patient has consented to  Procedure(s): LEFT HEART CATHETERIZATION WITH CORONARY ANGIOGRAM (N/A) and possible PCI as a surgical intervention .  The patient's history has been reviewed, patient examined, no change in status, stable for surgery.  I have reviewed the patient's chart and labs.  Questions were answered to the patient's satisfaction.   Cath Lab Visit (complete for each Cath Lab visit)  Clinical Evaluation Leading to the Procedure:   ACS: No.  Non-ACS:    Anginal Classification: CCS III  Anti-ischemic medical therapy: Maximal Therapy (2 or more classes of medications)  Non-Invasive Test Results: Intermediate-risk stress test findings: cardiac mortality 1-3%/year  Prior CABG: No previous CABG        [facility]

## 2014-05-29 NOTE — Progress Notes (Signed)
TR BAND REMOVAL  LOCATION:    right radial  DEFLATED PER PROTOCOL:    Yes.    TIME BAND OFF / DRESSING APPLIED:    1000   SITE UPON ARRIVAL:    Level 0  SITE AFTER BAND REMOVAL:    Level 0   CIRCULATION SENSATION AND MOVEMENT:    Within Normal Limits   Yes.    COMMENTS:   TRB REMOVED/ TEGADERM DSG APPLIED

## 2014-05-29 NOTE — Discharge Instructions (Signed)
Radial Site Care °Refer to this sheet in the next few weeks. These instructions provide you with information on caring for yourself after your procedure. Your caregiver may also give you more specific instructions. Your treatment has been planned according to current medical practices, but problems sometimes occur. Call your caregiver if you have any problems or questions after your procedure. °HOME CARE INSTRUCTIONS °· You may shower the day after the procedure. Remove the bandage (dressing) and gently wash the site with plain soap and water. Gently pat the site dry. °· Do not apply powder or lotion to the site. °· Do not submerge the affected site in water for 3 to 5 days. °· Inspect the site at least twice daily. °· Do not flex or bend the affected arm for 24 hours. °· No lifting over 5 pounds (2.3 kg) for 5 days after your procedure. °· Do not drive home if you are discharged the same day of the procedure. Have someone else drive you. °· You may drive 24 hours after the procedure unless otherwise instructed by your caregiver. °· Do not operate machinery or power tools for 24 hours. °· A responsible adult should be with you for the first 24 hours after you arrive home. °What to expect: °· Any bruising will usually fade within 1 to 2 weeks. °· Blood that collects in the tissue (hematoma) may be painful to the touch. It should usually decrease in size and tenderness within 1 to 2 weeks. °SEEK IMMEDIATE MEDICAL CARE IF: °· You have unusual pain at the radial site. °· You have redness, warmth, swelling, or pain at the radial site. °· You have drainage (other than a small amount of blood on the dressing). °· You have chills. °· You have a fever or persistent symptoms for more than 72 hours. °· You have a fever and your symptoms suddenly get worse. °· Your arm becomes pale, cool, tingly, or numb. °· You have heavy bleeding from the site. Hold pressure on the site. °Document Released: 09/19/2010 Document Revised:  11/09/2011 Document Reviewed: 09/19/2010 °ExitCare® Patient Information ©2015 [employer]. This information is not intended to replace advice given to you by your health care provider. Make sure you discuss any questions you have with your health care provider. ° °

## 2014-05-29 NOTE — CV Procedure (Signed)
Procedure performed:  Left heart catheterization including hemodynamic monitoring of the left ventricle, LV gram, selective right and left coronary arteriography.  Indication patient is a 73 year-old female with history of hypertension,  hyperlipidemia, borderline DM who presents with chest pain suggestive of angina pectoris and abnormal stress test showing inferior ischemia.  Chest pain persistent in spite of aggressive medical therapy. Hence is brought to the cardiac catheterization lab to evaluate the  coronary anatomy for definitive diagnosis of CAD.  Hemodynamic data:  Left ventricular pressure was 115/3 with LVEDP of 16 mm mercury. Aortic pressure was 112/54 with a mean of 77 mm mercury. There was no pressure gradient across the aortic valve  Left ventricle: Performed in the RAO projection revealed LVEF of 55-60%. There was No MR. No wall motion abnormality.  Right coronary artery: The vessel is smooth, normal, Dominant.  Left main coronary artery is large and normal. Proximal coronary calcification noted.  Circumflex coronary artery:  Moderate sized, tortuous. It is smooth and normal.   LAD:  LAD gives origin to a large diagonal-1.  Normal.   Technique: Under sterile precautions using a 6 French right radial  arterial access, a 6 French sheath was introduced into the right radial artery. A 5 [place] Tig 4 catheter was advanced into the ascending aorta selective  right coronary artery and left coronary artery was cannulated and angiography was performed in multiple views. The catheter was pulled back Out of the body over exchange length J-wire. Same Catheter was used to perform LV gram which was performed in RAO projection.  Catheter exchanged out of the body over J-Wire. NO immediate complications noted. Patient tolerated the procedure well.   Rec: Medical therapy with aggressive risk factor reduction. Evaluate for non cardiac chest pain. 50 cc contrast used.   Disposition: Will be  discharged home today with outpatient follow up.

## 2014-06-06 DIAGNOSIS — I10 Essential (primary) hypertension: Secondary | ICD-10-CM | POA: Diagnosis not present

## 2014-06-06 DIAGNOSIS — E785 Hyperlipidemia, unspecified: Secondary | ICD-10-CM | POA: Diagnosis not present

## 2014-06-06 DIAGNOSIS — R0989 Other specified symptoms and signs involving the circulatory and respiratory systems: Secondary | ICD-10-CM | POA: Diagnosis not present

## 2014-06-06 DIAGNOSIS — I209 Angina pectoris, unspecified: Secondary | ICD-10-CM | POA: Diagnosis not present

## 2014-08-09 ENCOUNTER — Encounter (HOSPITAL_COMMUNITY): Payer: Self-pay | Admitting: Cardiology

## 2014-08-28 DIAGNOSIS — R7309 Other abnormal glucose: Secondary | ICD-10-CM | POA: Diagnosis not present

## 2014-08-28 DIAGNOSIS — I10 Essential (primary) hypertension: Secondary | ICD-10-CM | POA: Diagnosis not present

## 2014-08-28 DIAGNOSIS — M1990 Unspecified osteoarthritis, unspecified site: Secondary | ICD-10-CM | POA: Diagnosis not present

## 2014-08-28 DIAGNOSIS — Z23 Encounter for immunization: Secondary | ICD-10-CM | POA: Diagnosis not present

## 2014-12-26 DIAGNOSIS — I10 Essential (primary) hypertension: Secondary | ICD-10-CM | POA: Diagnosis not present

## 2014-12-26 DIAGNOSIS — F064 Anxiety disorder due to known physiological condition: Secondary | ICD-10-CM | POA: Diagnosis not present

## 2014-12-26 DIAGNOSIS — R600 Localized edema: Secondary | ICD-10-CM | POA: Diagnosis not present

## 2014-12-26 DIAGNOSIS — R7309 Other abnormal glucose: Secondary | ICD-10-CM | POA: Diagnosis not present

## 2015-02-07 DIAGNOSIS — F064 Anxiety disorder due to known physiological condition: Secondary | ICD-10-CM | POA: Diagnosis not present

## 2015-02-07 DIAGNOSIS — M1990 Unspecified osteoarthritis, unspecified site: Secondary | ICD-10-CM | POA: Diagnosis not present

## 2015-02-07 DIAGNOSIS — I10 Essential (primary) hypertension: Secondary | ICD-10-CM | POA: Diagnosis not present

## 2015-02-07 DIAGNOSIS — R600 Localized edema: Secondary | ICD-10-CM | POA: Diagnosis not present

## 2015-03-26 DIAGNOSIS — Z0000 Encounter for general adult medical examination without abnormal findings: Secondary | ICD-10-CM | POA: Diagnosis not present

## 2015-04-02 DIAGNOSIS — F064 Anxiety disorder due to known physiological condition: Secondary | ICD-10-CM | POA: Diagnosis not present

## 2015-04-02 DIAGNOSIS — N3281 Overactive bladder: Secondary | ICD-10-CM | POA: Diagnosis not present

## 2015-04-02 DIAGNOSIS — I10 Essential (primary) hypertension: Secondary | ICD-10-CM | POA: Diagnosis not present

## 2015-04-02 DIAGNOSIS — M1990 Unspecified osteoarthritis, unspecified site: Secondary | ICD-10-CM | POA: Diagnosis not present

## 2015-04-15 DIAGNOSIS — M179 Osteoarthritis of knee, unspecified: Secondary | ICD-10-CM | POA: Diagnosis not present

## 2015-04-29 DIAGNOSIS — M1711 Unilateral primary osteoarthritis, right knee: Secondary | ICD-10-CM | POA: Diagnosis not present

## 2015-04-29 DIAGNOSIS — M179 Osteoarthritis of knee, unspecified: Secondary | ICD-10-CM | POA: Diagnosis not present

## 2015-04-29 DIAGNOSIS — Z471 Aftercare following joint replacement surgery: Secondary | ICD-10-CM | POA: Diagnosis not present

## 2015-04-29 DIAGNOSIS — M25561 Pain in right knee: Secondary | ICD-10-CM | POA: Diagnosis not present

## 2015-04-29 DIAGNOSIS — Z96641 Presence of right artificial hip joint: Secondary | ICD-10-CM | POA: Diagnosis not present

## 2015-04-29 DIAGNOSIS — M25551 Pain in right hip: Secondary | ICD-10-CM | POA: Diagnosis not present

## 2015-05-16 DIAGNOSIS — R109 Unspecified abdominal pain: Secondary | ICD-10-CM | POA: Diagnosis not present

## 2015-06-19 DIAGNOSIS — M179 Osteoarthritis of knee, unspecified: Secondary | ICD-10-CM | POA: Diagnosis not present

## 2015-07-11 DIAGNOSIS — I10 Essential (primary) hypertension: Secondary | ICD-10-CM | POA: Diagnosis not present

## 2015-07-11 DIAGNOSIS — M10271 Drug-induced gout, right ankle and foot: Secondary | ICD-10-CM | POA: Diagnosis not present

## 2015-07-11 DIAGNOSIS — F064 Anxiety disorder due to known physiological condition: Secondary | ICD-10-CM | POA: Diagnosis not present

## 2015-07-11 DIAGNOSIS — Z6826 Body mass index (BMI) 26.0-26.9, adult: Secondary | ICD-10-CM | POA: Diagnosis not present

## 2015-07-11 DIAGNOSIS — R7309 Other abnormal glucose: Secondary | ICD-10-CM | POA: Diagnosis not present

## 2015-08-21 DIAGNOSIS — I10 Essential (primary) hypertension: Secondary | ICD-10-CM | POA: Diagnosis not present

## 2015-08-21 DIAGNOSIS — R7309 Other abnormal glucose: Secondary | ICD-10-CM | POA: Diagnosis not present

## 2015-08-21 DIAGNOSIS — K219 Gastro-esophageal reflux disease without esophagitis: Secondary | ICD-10-CM | POA: Diagnosis not present

## 2015-08-21 DIAGNOSIS — F064 Anxiety disorder due to known physiological condition: Secondary | ICD-10-CM | POA: Diagnosis not present

## 2015-09-18 DIAGNOSIS — H401133 Primary open-angle glaucoma, bilateral, severe stage: Secondary | ICD-10-CM | POA: Diagnosis not present

## 2015-09-22 ENCOUNTER — Emergency Department (HOSPITAL_COMMUNITY)
Admission: EM | Admit: 2015-09-22 | Discharge: 2015-09-22 | Disposition: A | Discharge status: Home / Self Care | Payer: Medicare Other | Attending: Emergency Medicine | Admitting: Emergency Medicine

## 2015-09-22 ENCOUNTER — Other Ambulatory Visit (HOSPITAL_COMMUNITY): Payer: Self-pay | Admitting: Emergency Medicine

## 2015-09-22 ENCOUNTER — Encounter (HOSPITAL_COMMUNITY): Payer: Self-pay | Admitting: Emergency Medicine

## 2015-09-22 ENCOUNTER — Emergency Department (HOSPITAL_COMMUNITY): Payer: Medicare Other

## 2015-09-22 DIAGNOSIS — Z9889 Other specified postprocedural states: Secondary | ICD-10-CM | POA: Insufficient documentation

## 2015-09-22 DIAGNOSIS — M1990 Unspecified osteoarthritis, unspecified site: Secondary | ICD-10-CM | POA: Insufficient documentation

## 2015-09-22 DIAGNOSIS — Y9389 Activity, other specified: Secondary | ICD-10-CM | POA: Insufficient documentation

## 2015-09-22 DIAGNOSIS — Z7982 Long term (current) use of aspirin: Secondary | ICD-10-CM | POA: Insufficient documentation

## 2015-09-22 DIAGNOSIS — Z8719 Personal history of other diseases of the digestive system: Secondary | ICD-10-CM | POA: Diagnosis not present

## 2015-09-22 DIAGNOSIS — M79606 Pain in leg, unspecified: Secondary | ICD-10-CM

## 2015-09-22 DIAGNOSIS — H409 Unspecified glaucoma: Secondary | ICD-10-CM | POA: Insufficient documentation

## 2015-09-22 DIAGNOSIS — Z79899 Other long term (current) drug therapy: Secondary | ICD-10-CM | POA: Diagnosis not present

## 2015-09-22 DIAGNOSIS — Y998 Other external cause status: Secondary | ICD-10-CM | POA: Diagnosis not present

## 2015-09-22 DIAGNOSIS — W06XXXA Fall from bed, initial encounter: Secondary | ICD-10-CM | POA: Insufficient documentation

## 2015-09-22 DIAGNOSIS — I10 Essential (primary) hypertension: Secondary | ICD-10-CM | POA: Diagnosis not present

## 2015-09-22 DIAGNOSIS — Z96642 Presence of left artificial hip joint: Secondary | ICD-10-CM | POA: Insufficient documentation

## 2015-09-22 DIAGNOSIS — M179 Osteoarthritis of knee, unspecified: Secondary | ICD-10-CM | POA: Diagnosis not present

## 2015-09-22 DIAGNOSIS — Y9289 Other specified places as the place of occurrence of the external cause: Secondary | ICD-10-CM | POA: Diagnosis not present

## 2015-09-22 DIAGNOSIS — M25561 Pain in right knee: Secondary | ICD-10-CM | POA: Diagnosis not present

## 2015-09-22 DIAGNOSIS — E119 Type 2 diabetes mellitus without complications: Secondary | ICD-10-CM | POA: Diagnosis not present

## 2015-09-22 DIAGNOSIS — R21 Rash and other nonspecific skin eruption: Secondary | ICD-10-CM | POA: Diagnosis not present

## 2015-09-22 DIAGNOSIS — M7989 Other specified soft tissue disorders: Secondary | ICD-10-CM

## 2015-09-22 DIAGNOSIS — S8991XA Unspecified injury of right lower leg, initial encounter: Secondary | ICD-10-CM | POA: Insufficient documentation

## 2015-09-22 DIAGNOSIS — M25551 Pain in right hip: Secondary | ICD-10-CM | POA: Diagnosis not present

## 2015-09-22 DIAGNOSIS — M1710 Unilateral primary osteoarthritis, unspecified knee: Secondary | ICD-10-CM

## 2015-09-22 LAB — BASIC METABOLIC PANEL
ANION GAP: 7 (ref 5–15)
BUN: 10 mg/dL (ref 6–20)
CALCIUM: 9.2 mg/dL (ref 8.9–10.3)
CO2: 26 mmol/L (ref 22–32)
Chloride: 105 mmol/L (ref 101–111)
Creatinine, Ser: 0.9 mg/dL (ref 0.44–1.00)
Glucose, Bld: 108 mg/dL — ABNORMAL HIGH (ref 65–99)
POTASSIUM: 3.7 mmol/L (ref 3.5–5.1)
Sodium: 138 mmol/L (ref 135–145)

## 2015-09-22 LAB — CBC WITH DIFFERENTIAL/PLATELET
BASOS ABS: 0 10*3/uL (ref 0.0–0.1)
Basophils Relative: 1 %
EOS ABS: 0.2 10*3/uL (ref 0.0–0.7)
EOS PCT: 3 %
HCT: 38.2 % (ref 36.0–46.0)
HEMOGLOBIN: 12.2 g/dL (ref 12.0–15.0)
LYMPHS ABS: 1.9 10*3/uL (ref 0.7–4.0)
LYMPHS PCT: 26 %
MCH: 27.9 pg (ref 26.0–34.0)
MCHC: 31.9 g/dL (ref 30.0–36.0)
MCV: 87.4 fL (ref 78.0–100.0)
Monocytes Absolute: 0.4 10*3/uL (ref 0.1–1.0)
Monocytes Relative: 6 %
NEUTROS PCT: 64 %
Neutro Abs: 4.7 10*3/uL (ref 1.7–7.7)
PLATELETS: 282 10*3/uL (ref 150–400)
RBC: 4.37 MIL/uL (ref 3.87–5.11)
RDW: 14.2 % (ref 11.5–15.5)
WBC: 7.1 10*3/uL (ref 4.0–10.5)

## 2015-09-22 LAB — BRAIN NATRIURETIC PEPTIDE: B NATRIURETIC PEPTIDE 5: 45 pg/mL (ref 0.0–100.0)

## 2015-09-22 MED ORDER — NAPROXEN 500 MG PO TABS: 500.0000 mg | ORAL_TABLET | Freq: Two times a day (BID) | ORAL | Status: DC

## 2015-09-22 NOTE — ED Notes (Signed)
Pt reports 2-3 weeks ago falling out of bed, pt has had two hip replacements, having pain in right hip currently and right knee.  Limited ROM.

## 2015-09-22 NOTE — ED Notes (Signed)
PT pulses dopplers and marked. Unable to doppler DP pulses

## 2015-09-22 NOTE — ED Provider Notes (Signed)
CSN: [id number]     Arrival date & time 09/22/15  1301 History   First MD Initiated Contact with Patient 09/22/15 1458     Chief Complaint  Patient presents with  . Fall     (Consider location/radiation/quality/duration/timing/severity/associated sxs/prior Treatment) HPI Comments: Patient reports pain to her right leg after falling out of bed about 3 weeks ago. Pain radiates from her right hip to her right knee. She is concerned because she has had right hip replacement bilaterally. She is still able to walk. Denies losing consciousness. Denies any back pain. No weakness, numbness or tingling. No bowel or bladder incontinence. No fever or vomiting. No chest pain or shortness of breath.  Also reports erythema and swelling to her legs for the past 2 days after using an unknown arthritis cream (diclofenac). Denies any history of heart failure. Denies any Shortness of breath or chest pain.  The history is provided by the patient.    Past Medical History  Diagnosis Date  . Hypertension   . Glaucoma   . Dysrhythmia   . Shortness of breath     walk a long way  . Peripheral vascular disease ([place])   . GERD (gastroesophageal reflux disease)   . Arthritis   . H/O hiatal hernia   . Diabetes mellitus without complication [facility])    Past Surgical History  Procedure Laterality Date  . Eye surgery    . Joint replacement      hip replacement  . Arthroplasty  03/09/2012    lt hip  . Total hip arthroplasty  03/09/2012    Procedure: TOTAL HIP ARTHROPLASTY;  Surgeon: Sharmon Revere, MD;  Location: [place];  Service: Orthopedics;  Laterality: Left;  . Left heart catheterization with coronary angiogram N/A 05/29/2014    Procedure: LEFT HEART CATHETERIZATION WITH CORONARY ANGIOGRAM;  Surgeon: Laverda Page, MD;  Location: [facility] CATH LAB;  Service: Cardiovascular;  Laterality: N/A;   History reviewed. No pertinent family history. Social History  Substance Use Topics  . Smoking status: Never Smoker    . Smokeless tobacco: Never Used  . Alcohol Use: No   OB History    No data available     Review of Systems  Constitutional: Negative for fever, activity change and appetite change.  HENT: Negative for congestion and rhinorrhea.   Respiratory: Negative for cough, chest tightness and shortness of breath.   Cardiovascular: Positive for leg swelling. Negative for chest pain.  Gastrointestinal: Negative for nausea, vomiting and abdominal pain.  Genitourinary: Negative for dysuria and hematuria.  Musculoskeletal: Positive for myalgias and arthralgias. Negative for back pain.  Skin: Positive for rash and wound.  Neurological: Negative for dizziness, weakness, numbness and headaches.  A complete 10 system review of systems was obtained and all systems are negative except as noted in the HPI and PMH.      Allergies  Codeine  Home Medications   Prior to Admission medications   Medication Sig Start Date End Date Taking? Authorizing Provider  amLODipine (NORVASC) 10 MG tablet Take 10 mg by mouth daily.     Yes Historical Provider, MD  aspirin EC 81 MG tablet Take 81 mg by mouth daily.   Yes Historical Provider, MD  bimatoprost (LUMIGAN) 0.01 % SOLN Place 1 drop into both eyes at bedtime.     Yes Historical Provider, MD  brimonidine-timolol (COMBIGAN) 0.2-0.5 % ophthalmic solution Place 1 drop into both eyes every 12 (twelve) hours.     Yes Historical Provider, MD  naproxen (NAPROSYN) 500 MG tablet Take 1 tablet (500 mg total) by mouth 2 (two) times daily. 09/22/15   Ezequiel Essex, MD  spironolactone (ALDACTONE) 25 MG tablet Take 25 mg by mouth 2 (two) times daily.   Yes Historical Provider, MD   BP 136/58 mmHg  Pulse 90  Temp(Src) 98.5 F (36.9 C) (Oral)  Resp 14  Ht 5\' 2"  (1.575 m)  Wt 197 lb (89.359 kg)  BMI 36.02 kg/m2  SpO2 97% Physical Exam  Constitutional: She is oriented to person, place, and time. She appears well-developed and well-nourished. No distress.  HENT:  Head:  Normocephalic and atraumatic.  Mouth/Throat: Oropharynx is clear and moist. No oropharyngeal exudate.  Eyes: Conjunctivae and EOM are normal. Pupils are equal, round, and reactive to light.  Neck: Normal range of motion. Neck supple.  No meningismus.  Cardiovascular: Normal rate, regular rhythm, normal heart sounds and intact distal pulses.   No murmur heard. Pulmonary/Chest: Effort normal and breath sounds normal. No respiratory distress.  Abdominal: Soft. There is no tenderness. There is no rebound and no guarding.  Musculoskeletal: She exhibits edema and tenderness.  +1 pedal edema bilaterally with erythematous shins.  Ankle flexion and extension intact, great toe extension intact bilaterally  Unable to palpate DP or PT pulses. PT and DP pulses present by Doppler.  Pain with range of motion of right knee. Flexion and extension of right knee and hip intact. Compartments soft.  Neurological: She is alert and oriented to person, place, and time. No cranial nerve deficit. She exhibits normal muscle tone. Coordination normal.  No ataxia on finger to nose bilaterally. No pronator drift. 5/5 strength throughout. CN 2-12 intact.Equal grip strength. Sensation intact.   Skin: Skin is warm.  Psychiatric: She has a normal mood and affect. Her behavior is normal.  Nursing note and vitals reviewed.   ED Course  Procedures (including critical care time) Labs Review Labs Reviewed  BASIC METABOLIC PANEL - Abnormal; Notable for the following:    Glucose, Bld 108 (*)    All other components within normal limits  CBC WITH DIFFERENTIAL/PLATELET  BRAIN NATRIURETIC PEPTIDE    Imaging Review Dg Chest 2 View  09/22/2015  CLINICAL DATA:  Leg swelling EXAM: CHEST  2 VIEW COMPARISON:  02/22/2014 FINDINGS: Borderline cardiomegaly. No acute infiltrate or pleural effusion. No pulmonary edema. Osteopenia and mild degenerative changes mid thoracic spine. Degenerative changes bilateral shoulders. IMPRESSION:  No active disease. Borderline cardiomegaly. Degenerative changes thoracic spine and bilateral shoulders. Electronically Signed   By: Lahoma Crocker M.D.   On: 09/22/2015 16:45   Dg Knee Complete 4 Views Right  09/22/2015  CLINICAL DATA:  Right knee pain. EXAM: RIGHT KNEE - COMPLETE 4+ VIEW COMPARISON:  None. FINDINGS: Advanced osteoarthritis of the right knee present with near complete loss of medial joint space height. There is moderate joint space narrowing laterally. Moderate proliferative changes are also seen involving the patella. No fracture, dislocation or joint effusion. No bony lesions identified. 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 The most significant joint space narrowing involves the medial compartment. Electronically Signed   By: Aletta Edouard M.D.   On: 09/22/2015 14:36   Dg Hip Unilat  With Pelvis 2-3 Views Right  09/22/2015  CLINICAL DATA:  Right hip pain and knee pain after falling out of bed 2-3 weeks ago EXAM: DG HIP (WITH OR WITHOUT PELVIS) 2-3V RIGHT COMPARISON:  None. FINDINGS: Bilateral total hip replacements. Components appear to be in anticipated position. Pelvic  bones are intact. No evidence of proximal right femur fracture. IMPRESSION: No acute findings Electronically Signed   By: Skipper Cliche M.D.   On: 09/22/2015 15:08   I have personally reviewed and evaluated these images and lab results as part of my medical decision-making.   EKG Interpretation None      MDM   Final diagnoses:  Pain of lower extremity, unspecified laterality  Arthritis of knee   Patient presents with pain in her right leg that has been ongoing for the past several weeks after falling out of bed. X-rays done in triage are negative but showed arthritic changes. She has swelling and redness to her bilateral shins that she states that after she started using diclofenac cream per her PCP. Denies any chest pain or shortness of breath.  Difficult to  palpate pulses but they are present by Doppler. LHC in 2015 showed nonobstructive coronary disease and normal EF.  BNP is normal. No evidence of heart failure. No chest pain or shortness of breath. Chest x-ray is clear.  Advised to discontinue use of diclofenac cream as the redness and swelling her legs started after she started this. Patient has intact pulses with Doppler.  We'll have return tomorrow for Dopplers to rule out DVT. Patient ambulatory with her walker which is her baseline.   Ezequiel Essex, MD 09/22/15 [id number]

## 2015-09-22 NOTE — ED Notes (Signed)
DP pulses present per doppler

## 2015-09-22 NOTE — Discharge Instructions (Signed)
Arthritis Stopped using the cream on your legs. Return tomorrow for an ultrasound of your legs to rule out blood clot. Return to the ED if you develop worsening pain, chest pain, shortness of breath or any other concerns. Arthritis is a term that is commonly used to refer to joint pain or joint disease. There are more than 100 types of arthritis. CAUSES The most common cause of this condition is wear and tear of a joint. Other causes include:  Gout.  Inflammation of a joint.  An infection of a joint.  Sprains and other injuries near the joint.  A drug reaction or allergic reaction. In some cases, the cause may not be known. SYMPTOMS The main symptom of this condition is pain in the joint with movement. Other symptoms include:  Redness, swelling, or stiffness at a joint.  Warmth coming from the joint.  Fever.  Overall feeling of illness. DIAGNOSIS This condition may be diagnosed with a physical exam and tests, including:  Blood tests.  Urine tests.  Imaging tests, such as MRI, X-rays, or a CT scan. Sometimes, fluid is removed from a joint for testing. TREATMENT Treatment for this condition may involve:  Treatment of the cause, if it is known.  Rest.  Raising (elevating) the joint.  Applying cold or hot packs to the joint.  Medicines to improve symptoms and reduce inflammation.  Injections of a steroid such as cortisone into the joint to help reduce pain and inflammation. Depending on the cause of your arthritis, you may need to make lifestyle changes to reduce stress on your joint. These changes may include exercising more and losing weight. HOME CARE INSTRUCTIONS Medicines  Take over-the-counter and prescription medicines only as told by your health care provider.  Do not take aspirin to relieve pain if gout is suspected. Activities  Rest your joint if told by your health care provider. Rest is important when your disease is active and your joint feels  painful, swollen, or stiff.  Avoid activities that make the pain worse. It is important to balance activity with rest.  Exercise your joint regularly with range-of-motion exercises as told by your health care provider. Try doing low-impact exercise, such as:  Swimming.  Water aerobics.  Biking.  Walking. Joint Care  If your joint is swollen, keep it elevated if told by your health care provider.  If your joint feels stiff in the morning, try taking a warm shower.  If directed, apply heat to the joint. If you have diabetes, do not apply heat without permission from your health care provider.  Put a towel between the joint and the hot pack or heating pad.  Leave the heat on the area for 20-30 minutes.  If directed, apply ice to the joint:  Put ice in a plastic bag.  Place a towel between your skin and the bag.  Leave the ice on for 20 minutes, 2-3 times per day.  Keep all follow-up visits as told by your health care provider. This is important. SEEK MEDICAL CARE IF:  The pain gets worse.  You have a fever. SEEK IMMEDIATE MEDICAL CARE IF:  You develop severe joint pain, swelling, or redness.  Many joints become painful and swollen.  You develop severe back pain.  You develop severe weakness in your leg.  You cannot control your bladder or bowels.   This information is not intended to replace advice given to you by your health care provider. Make sure you discuss any questions you have  with your health care provider.   Document Released: 09/24/2004 Document Revised: 05/08/2015 Document Reviewed: 11/12/2014 Elsevier Interactive Patient Education [employer].

## 2015-09-22 NOTE — ED Notes (Signed)
Patient ambulated with walker, which she normally uses. Pain to right knee, slight limp. Pt c/o of SOB which she states is normal for her

## 2015-09-23 ENCOUNTER — Ambulatory Visit (HOSPITAL_COMMUNITY): Admit: 2015-09-23 | Payer: Medicare Other

## 2015-09-23 ENCOUNTER — Ambulatory Visit (HOSPITAL_COMMUNITY)
Admission: RE | Admit: 2015-09-23 | Discharge: 2015-09-23 | Disposition: A | Discharge status: Home / Self Care | Payer: Medicare Other | Source: Ambulatory Visit | Attending: Emergency Medicine | Admitting: Emergency Medicine

## 2015-09-23 DIAGNOSIS — M79604 Pain in right leg: Secondary | ICD-10-CM | POA: Diagnosis not present

## 2015-09-23 DIAGNOSIS — M7989 Other specified soft tissue disorders: Secondary | ICD-10-CM | POA: Diagnosis present

## 2015-09-23 DIAGNOSIS — M79605 Pain in left leg: Secondary | ICD-10-CM | POA: Diagnosis not present

## 2015-09-23 DIAGNOSIS — M79606 Pain in leg, unspecified: Secondary | ICD-10-CM

## 2015-09-25 ENCOUNTER — Emergency Department (HOSPITAL_COMMUNITY)
Admission: EM | Admit: 2015-09-25 | Discharge: 2015-09-25 | Disposition: A | Discharge status: Home / Self Care | Payer: Medicare Other | Attending: Emergency Medicine | Admitting: Emergency Medicine

## 2015-09-25 ENCOUNTER — Encounter (HOSPITAL_COMMUNITY): Payer: Self-pay | Admitting: *Deleted

## 2015-09-25 ENCOUNTER — Emergency Department (HOSPITAL_COMMUNITY): Payer: Medicare Other

## 2015-09-25 ENCOUNTER — Other Ambulatory Visit (HOSPITAL_COMMUNITY): Payer: Self-pay | Admitting: Family Medicine

## 2015-09-25 DIAGNOSIS — R0602 Shortness of breath: Secondary | ICD-10-CM | POA: Diagnosis not present

## 2015-09-25 DIAGNOSIS — Z8719 Personal history of other diseases of the digestive system: Secondary | ICD-10-CM | POA: Diagnosis not present

## 2015-09-25 DIAGNOSIS — Z9889 Other specified postprocedural states: Secondary | ICD-10-CM | POA: Diagnosis not present

## 2015-09-25 DIAGNOSIS — H409 Unspecified glaucoma: Secondary | ICD-10-CM | POA: Insufficient documentation

## 2015-09-25 DIAGNOSIS — Z7982 Long term (current) use of aspirin: Secondary | ICD-10-CM | POA: Diagnosis not present

## 2015-09-25 DIAGNOSIS — M545 Low back pain: Secondary | ICD-10-CM | POA: Diagnosis not present

## 2015-09-25 DIAGNOSIS — E119 Type 2 diabetes mellitus without complications: Secondary | ICD-10-CM | POA: Diagnosis not present

## 2015-09-25 DIAGNOSIS — Z79899 Other long term (current) drug therapy: Secondary | ICD-10-CM | POA: Diagnosis not present

## 2015-09-25 DIAGNOSIS — Z87828 Personal history of other (healed) physical injury and trauma: Secondary | ICD-10-CM | POA: Diagnosis not present

## 2015-09-25 DIAGNOSIS — R609 Edema, unspecified: Secondary | ICD-10-CM

## 2015-09-25 DIAGNOSIS — M7989 Other specified soft tissue disorders: Secondary | ICD-10-CM | POA: Diagnosis present

## 2015-09-25 DIAGNOSIS — I10 Essential (primary) hypertension: Secondary | ICD-10-CM | POA: Diagnosis not present

## 2015-09-25 DIAGNOSIS — M1990 Unspecified osteoarthritis, unspecified site: Secondary | ICD-10-CM | POA: Diagnosis not present

## 2015-09-25 DIAGNOSIS — R600 Localized edema: Secondary | ICD-10-CM | POA: Diagnosis not present

## 2015-09-25 DIAGNOSIS — Z6836 Body mass index (BMI) 36.0-36.9, adult: Secondary | ICD-10-CM | POA: Diagnosis not present

## 2015-09-25 DIAGNOSIS — L03116 Cellulitis of left lower limb: Secondary | ICD-10-CM | POA: Diagnosis not present

## 2015-09-25 DIAGNOSIS — L03119 Cellulitis of unspecified part of limb: Secondary | ICD-10-CM

## 2015-09-25 DIAGNOSIS — L03115 Cellulitis of right lower limb: Secondary | ICD-10-CM | POA: Diagnosis not present

## 2015-09-25 LAB — COMPREHENSIVE METABOLIC PANEL
ALT: 14 U/L (ref 14–54)
ANION GAP: 11 (ref 5–15)
AST: 21 U/L (ref 15–41)
Albumin: 3.2 g/dL — ABNORMAL LOW (ref 3.5–5.0)
Alkaline Phosphatase: 97 U/L (ref 38–126)
BUN: 12 mg/dL (ref 6–20)
CHLORIDE: 105 mmol/L (ref 101–111)
CO2: 25 mmol/L (ref 22–32)
Calcium: 9.2 mg/dL (ref 8.9–10.3)
Creatinine, Ser: 0.98 mg/dL (ref 0.44–1.00)
GFR, EST NON AFRICAN AMERICAN: 55 mL/min — AB (ref >60)
Glucose, Bld: 99 mg/dL (ref 65–99)
POTASSIUM: 3.8 mmol/L (ref 3.5–5.1)
SODIUM: 141 mmol/L (ref 135–145)
Total Bilirubin: 0.4 mg/dL (ref 0.3–1.2)
Total Protein: 6.7 g/dL (ref 6.5–8.1)

## 2015-09-25 LAB — CBC WITH DIFFERENTIAL/PLATELET
Basophils Absolute: 0 10*3/uL (ref 0.0–0.1)
Basophils Relative: 1 %
EOS ABS: 0.2 10*3/uL (ref 0.0–0.7)
EOS PCT: 3 %
HCT: 35.9 % — ABNORMAL LOW (ref 36.0–46.0)
Hemoglobin: 11.6 g/dL — ABNORMAL LOW (ref 12.0–15.0)
LYMPHS ABS: 1.7 10*3/uL (ref 0.7–4.0)
Lymphocytes Relative: 25 %
MCH: 28.1 pg (ref 26.0–34.0)
MCHC: 32.3 g/dL (ref 30.0–36.0)
MCV: 86.9 fL (ref 78.0–100.0)
MONO ABS: 0.6 10*3/uL (ref 0.1–1.0)
Monocytes Relative: 9 %
Neutro Abs: 4.2 10*3/uL (ref 1.7–7.7)
Neutrophils Relative %: 62 %
PLATELETS: 292 10*3/uL (ref 150–400)
RBC: 4.13 MIL/uL (ref 3.87–5.11)
RDW: 14.4 % (ref 11.5–15.5)
WBC: 6.7 10*3/uL (ref 4.0–10.5)

## 2015-09-25 LAB — BRAIN NATRIURETIC PEPTIDE: B NATRIURETIC PEPTIDE 5: 34.2 pg/mL (ref 0.0–100.0)

## 2015-09-25 LAB — TROPONIN I

## 2015-09-25 MED ORDER — CEPHALEXIN 250 MG PO CAPS
500.0000 mg | ORAL_CAPSULE | Freq: Three times a day (TID) | ORAL | Status: DC
  Administered 2015-09-25: 500 mg via ORAL
  Filled 2015-09-25: qty 2

## 2015-09-25 MED ORDER — CEPHALEXIN 500 MG PO CAPS: 500.0000 mg | ORAL_CAPSULE | Freq: Three times a day (TID) | ORAL | Status: DC

## 2015-09-25 NOTE — ED Provider Notes (Signed)
CSN: [id number]     Arrival date & time 09/25/15  1629 History   First MD Initiated Contact with Patient 09/25/15 2001     Chief Complaint  Patient presents with  . Leg Swelling   HPI Pt fell a couple of weeks ago.  Pt has been having trouble with pain in her legs for the last three days though.  She has been having pain more on the right leg .  The legs also feel swollen.  She has noticed some redness. She was seen at [facility] and saw her doctor today.  She says her doctor sent her here to make sure she didn't have blood clots.  No cp or sob.  No back pain.   Past Medical History  Diagnosis Date  . Hypertension   . Glaucoma   . Dysrhythmia   . Shortness of breath     walk a long way  . Peripheral vascular disease ([place])   . GERD (gastroesophageal reflux disease)   . Arthritis   . H/O hiatal hernia   . Diabetes mellitus without complication [facility])    Past Surgical History  Procedure Laterality Date  . Eye surgery    . Joint replacement      hip replacement  . Arthroplasty  03/09/2012    lt hip  . Total hip arthroplasty  03/09/2012    Procedure: TOTAL HIP ARTHROPLASTY;  Surgeon: Sharmon Revere, MD;  Location: [place];  Service: Orthopedics;  Laterality: Left;  . Left heart catheterization with coronary angiogram N/A 05/29/2014    Procedure: LEFT HEART CATHETERIZATION WITH CORONARY ANGIOGRAM;  Surgeon: Laverda Page, MD;  Location: [facility] CATH LAB;  Service: Cardiovascular;  Laterality: N/A;   No family history on file. Social History  Substance Use Topics  . Smoking status: Never Smoker   . Smokeless tobacco: Never Used  . Alcohol Use: No   OB History    No data available     Review of Systems  All other systems reviewed and are negative.     Allergies  Codeine  Home Medications   Prior to Admission medications   Medication Sig Start Date End Date Taking? Authorizing Provider  amLODipine (NORVASC) 10 MG tablet Take 10 mg by mouth daily.     Yes Historical  Provider, MD  aspirin EC 81 MG tablet Take 81 mg by mouth daily.   Yes Historical Provider, MD  bimatoprost (LUMIGAN) 0.01 % SOLN Place 1 drop into both eyes at bedtime.     Yes Historical Provider, MD  brimonidine-timolol (COMBIGAN) 0.2-0.5 % ophthalmic solution Place 1 drop into both eyes every 12 (twelve) hours.     Yes Historical Provider, MD  cilostazol (PLETAL) 100 MG tablet Take 100 mg by mouth 2 (two) times daily.   Yes Historical Provider, MD  spironolactone (ALDACTONE) 25 MG tablet Take 25 mg by mouth 2 (two) times daily.   Yes Historical Provider, MD  cephALEXin (KEFLEX) 500 MG capsule Take 1 capsule (500 mg total) by mouth 3 (three) times daily. 09/25/15   Dorie Rank, MD  naproxen (NAPROSYN) 500 MG tablet Take 1 tablet (500 mg total) by mouth 2 (two) times daily. Patient not taking: Reported on 09/25/2015 09/22/15   Ezequiel Essex, MD   BP 129/46 mmHg  Pulse 68  Temp(Src) 98.2 F (36.8 C) (Oral)  Resp 18  Ht 5\' 2"  (1.575 m)  Wt 93.033 kg  BMI 37.50 kg/m2  SpO2 100% Physical Exam  Constitutional: She appears  well-developed and well-nourished. No distress.  HENT:  Head: Normocephalic and atraumatic.  Right Ear: External ear normal.  Left Ear: External ear normal.  Eyes: Conjunctivae are normal. Right eye exhibits no discharge. Left eye exhibits no discharge. No scleral icterus.  Neck: Neck supple. No tracheal deviation present.  Cardiovascular: Normal rate, regular rhythm and intact distal pulses.   Pulmonary/Chest: Effort normal and breath sounds normal. No stridor. No respiratory distress. She has no wheezes. She has no rales.  Abdominal: Soft. Bowel sounds are normal. She exhibits no distension. There is no tenderness. There is no rebound and no guarding.  Musculoskeletal: She exhibits edema and tenderness.  Pitting edema bilateral lower extrem below the knees, erythem bilateral shins, DP pulses are weak but palpable, no cyanosis of the foot, no palor, no back ttp   Neurological: She is alert. She has normal strength. No cranial nerve deficit (no facial droop, extraocular movements intact, no slurred speech) or sensory deficit. She exhibits normal muscle tone. She displays no seizure activity. Coordination normal.  Skin: Skin is warm and dry. No rash noted.  Psychiatric: She has a normal mood and affect.  Nursing note and vitals reviewed.   ED Course  Procedures (including critical care time) Labs Review Labs Reviewed  CBC WITH DIFFERENTIAL/PLATELET - Abnormal; Notable for the following:    Hemoglobin 11.6 (*)    HCT 35.9 (*)    All other components within normal limits  COMPREHENSIVE METABOLIC PANEL - Abnormal; Notable for the following:    Albumin 3.2 (*)    GFR calc non Af Amer 55 (*)    All other components within normal limits  TROPONIN I  BRAIN NATRIURETIC PEPTIDE    Imaging Review Dg Chest 2 View  09/25/2015  CLINICAL DATA:  Shortness of breath, cardiac dysrhythmia. Bilateral leg pain and swelling. Initial encounter. EXAM: CHEST  2 VIEW COMPARISON:  PA and lateral chest 09/22/2015 and 02/22/2014. FINDINGS: The lungs are clear. There is no pneumothorax or pleural effusion. Heart size is normal. Mild elevation of the right hemidiaphragm relative to the left is unchanged. No focal bony abnormality is identified. Aortic atherosclerosis is again seen. IMPRESSION: No acute disease. Electronically Signed   By: Inge Rise M.D.   On: 09/25/2015 18:07   Dg Lumbar Spine Complete  09/25/2015  CLINICAL DATA:  Lumbago for several weeks.  Fall 2 weeks prior EXAM: LUMBAR SPINE - COMPLETE 4+ VIEW COMPARISON:  None. FINDINGS: Frontal, lateral, spot lumbosacral lateral, and bilateral oblique views were obtained. There are 5 non-rib-bearing lumbar type vertebral bodies. T12 ribs are markedly hypoplastic. There is no demonstrable fracture or spondylolisthesis. There is disc space narrowing, moderate, at L2-3, L3-4, L4-5, and L5-S1. There is facet  osteoarthritic change at all levels bilaterally, most notably at L4-5 and L5-S1 bilaterally. There are total hip replacements bilaterally. There is atherosclerotic calcification in the aorta. IMPRESSION: Multifocal osteoarthritic change. No acute fracture or spondylolisthesis. Atherosclerotic calcification in the aorta. Patient is status post total hip replacements bilaterally. Electronically Signed   By: Lowella Grip III M.D.   On: 09/25/2015 21:00   I have personally reviewed and evaluated these images and lab results as part of my medical decision-making.   EKG Interpretation   Date/Time:  Wednesday September 25 2015 17:24:25 EST Ventricular Rate:  74 PR Interval:  148 QRS Duration: 100 QT Interval:  432 QTC Calculation: 479 R Axis:   76 Text Interpretation:  Normal sinus rhythm with sinus arrhythmia Cannot  rule out Anterior infarct ,  age undetermined Abnormal ECG No significant  change since last tracing except PVCs are not present Confirmed by Nahmir Zeidman   MD-J, [place] Giammona [id number]) on 09/25/2015 9:47:52 PM      MDM   Final diagnoses:  Peripheral edema  Cellulitis of lower extremity, unspecified laterality    Reviewed records from recent visit.  Pt had bilateral doppler US studies yesterday.  No dvt noted.  She has palpable DP pulses and femoral pulses on exam.  No cyanosis.  Doubt acute arterial obstruction.  She could have some component of spinal stenosis causing leg pain but this would not cause her swelling.  Pt does have increased redness.  Possibility of a cellulitis causing the redness and swelling.  Will dc home with oral abx.  Safe to follow up with PCP.  Consider MRI of l spine or vascular consult if sx persist.    Dorie Rank, MD 09/25/15 2150

## 2015-09-25 NOTE — ED Notes (Signed)
Both legs swollen for 2-3 days worse today.  She denies sob

## 2015-09-25 NOTE — Discharge Instructions (Signed)

## 2015-10-16 DIAGNOSIS — H401133 Primary open-angle glaucoma, bilateral, severe stage: Secondary | ICD-10-CM | POA: Diagnosis not present

## 2015-10-18 DIAGNOSIS — H401133 Primary open-angle glaucoma, bilateral, severe stage: Secondary | ICD-10-CM | POA: Diagnosis not present

## 2015-12-04 DIAGNOSIS — Z1231 Encounter for screening mammogram for malignant neoplasm of breast: Secondary | ICD-10-CM | POA: Diagnosis not present

## 2015-12-04 DIAGNOSIS — Z803 Family history of malignant neoplasm of breast: Secondary | ICD-10-CM | POA: Diagnosis not present

## 2016-01-02 DIAGNOSIS — R921 Mammographic calcification found on diagnostic imaging of breast: Secondary | ICD-10-CM | POA: Diagnosis not present

## 2016-01-08 ENCOUNTER — Other Ambulatory Visit: Payer: Self-pay | Admitting: Radiology

## 2016-01-08 DIAGNOSIS — R920 Mammographic microcalcification found on diagnostic imaging of breast: Secondary | ICD-10-CM | POA: Diagnosis not present

## 2016-01-08 DIAGNOSIS — Z0000 Encounter for general adult medical examination without abnormal findings: Secondary | ICD-10-CM | POA: Diagnosis not present

## 2016-01-08 DIAGNOSIS — D242 Benign neoplasm of left breast: Secondary | ICD-10-CM | POA: Diagnosis not present

## 2016-01-27 ENCOUNTER — Emergency Department (HOSPITAL_COMMUNITY): Payer: Medicare Other

## 2016-01-27 ENCOUNTER — Inpatient Hospital Stay (HOSPITAL_COMMUNITY)
Admission: EM | Admit: 2016-01-27 | Discharge: 2016-01-30 | DRG: 603 | Disposition: A | Discharge status: Home Health | Payer: Medicare Other | Attending: Internal Medicine | Admitting: Internal Medicine

## 2016-01-27 ENCOUNTER — Encounter (HOSPITAL_COMMUNITY): Payer: Self-pay | Admitting: Emergency Medicine

## 2016-01-27 DIAGNOSIS — I878 Other specified disorders of veins: Secondary | ICD-10-CM

## 2016-01-27 DIAGNOSIS — S81811A Laceration without foreign body, right lower leg, initial encounter: Secondary | ICD-10-CM | POA: Diagnosis not present

## 2016-01-27 DIAGNOSIS — I739 Peripheral vascular disease, unspecified: Secondary | ICD-10-CM

## 2016-01-27 DIAGNOSIS — Z885 Allergy status to narcotic agent status: Secondary | ICD-10-CM

## 2016-01-27 DIAGNOSIS — R0602 Shortness of breath: Secondary | ICD-10-CM | POA: Diagnosis not present

## 2016-01-27 DIAGNOSIS — Z9181 History of falling: Secondary | ICD-10-CM

## 2016-01-27 DIAGNOSIS — K219 Gastro-esophageal reflux disease without esophagitis: Secondary | ICD-10-CM | POA: Diagnosis not present

## 2016-01-27 DIAGNOSIS — W1830XA Fall on same level, unspecified, initial encounter: Secondary | ICD-10-CM | POA: Diagnosis present

## 2016-01-27 DIAGNOSIS — I10 Essential (primary) hypertension: Secondary | ICD-10-CM | POA: Diagnosis present

## 2016-01-27 DIAGNOSIS — I82409 Acute embolism and thrombosis of unspecified deep veins of unspecified lower extremity: Secondary | ICD-10-CM

## 2016-01-27 DIAGNOSIS — Z7982 Long term (current) use of aspirin: Secondary | ICD-10-CM

## 2016-01-27 DIAGNOSIS — R0609 Other forms of dyspnea: Secondary | ICD-10-CM | POA: Diagnosis not present

## 2016-01-27 DIAGNOSIS — S80811A Abrasion, right lower leg, initial encounter: Secondary | ICD-10-CM | POA: Diagnosis present

## 2016-01-27 DIAGNOSIS — E119 Type 2 diabetes mellitus without complications: Secondary | ICD-10-CM | POA: Diagnosis not present

## 2016-01-27 DIAGNOSIS — R609 Edema, unspecified: Secondary | ICD-10-CM | POA: Diagnosis present

## 2016-01-27 DIAGNOSIS — M7989 Other specified soft tissue disorders: Secondary | ICD-10-CM | POA: Diagnosis not present

## 2016-01-27 DIAGNOSIS — L03115 Cellulitis of right lower limb: Secondary | ICD-10-CM | POA: Diagnosis present

## 2016-01-27 DIAGNOSIS — R5381 Other malaise: Secondary | ICD-10-CM

## 2016-01-27 DIAGNOSIS — Z79899 Other long term (current) drug therapy: Secondary | ICD-10-CM

## 2016-01-27 LAB — CBC WITH DIFFERENTIAL/PLATELET
BASOS ABS: 0 10*3/uL (ref 0.0–0.1)
BASOS PCT: 0 %
EOS ABS: 0.2 10*3/uL (ref 0.0–0.7)
Eosinophils Relative: 2 %
HCT: 31.6 % — ABNORMAL LOW (ref 36.0–46.0)
HEMOGLOBIN: 10.3 g/dL — AB (ref 12.0–15.0)
LYMPHS ABS: 2.1 10*3/uL (ref 0.7–4.0)
Lymphocytes Relative: 19 %
MCH: 26.5 pg (ref 26.0–34.0)
MCHC: 32.6 g/dL (ref 30.0–36.0)
MCV: 81.2 fL (ref 78.0–100.0)
Monocytes Absolute: 0.7 10*3/uL (ref 0.1–1.0)
Monocytes Relative: 6 %
NEUTROS PCT: 73 %
Neutro Abs: 8.3 10*3/uL — ABNORMAL HIGH (ref 1.7–7.7)
Platelets: 314 10*3/uL (ref 150–400)
RBC: 3.89 MIL/uL (ref 3.87–5.11)
RDW: 16 % — ABNORMAL HIGH (ref 11.5–15.5)
WBC: 11.3 10*3/uL — AB (ref 4.0–10.5)

## 2016-01-27 LAB — COMPREHENSIVE METABOLIC PANEL
ALBUMIN: 3.3 g/dL — AB (ref 3.5–5.0)
ALT: 16 U/L (ref 14–54)
AST: 20 U/L (ref 15–41)
Alkaline Phosphatase: 85 U/L (ref 38–126)
Anion gap: 7 (ref 5–15)
BUN: 22 mg/dL — AB (ref 6–20)
CHLORIDE: 101 mmol/L (ref 101–111)
CO2: 25 mmol/L (ref 22–32)
Calcium: 8.8 mg/dL — ABNORMAL LOW (ref 8.9–10.3)
Creatinine, Ser: 1 mg/dL (ref 0.44–1.00)
GFR calc Af Amer: >60 mL/min (ref >60)
GFR, EST NON AFRICAN AMERICAN: 54 mL/min — AB (ref >60)
Glucose, Bld: 115 mg/dL — ABNORMAL HIGH (ref 65–99)
POTASSIUM: 3.9 mmol/L (ref 3.5–5.1)
Sodium: 133 mmol/L — ABNORMAL LOW (ref 135–145)
Total Bilirubin: 0.6 mg/dL (ref 0.3–1.2)
Total Protein: 7.2 g/dL (ref 6.5–8.1)

## 2016-01-27 LAB — I-STAT TROPONIN, ED: TROPONIN I, POC: 0 ng/mL (ref 0.00–0.08)

## 2016-01-27 LAB — SEDIMENTATION RATE: SED RATE: 99 mm/h — AB (ref 0–22)

## 2016-01-27 LAB — BRAIN NATRIURETIC PEPTIDE: B NATRIURETIC PEPTIDE 5: 38.3 pg/mL (ref 0.0–100.0)

## 2016-01-27 MED ORDER — PIPERACILLIN-TAZOBACTAM 3.375 G IVPB 30 MIN
3.3750 g | Freq: Once | INTRAVENOUS | Status: DC
  Filled 2016-01-27: qty 50

## 2016-01-27 NOTE — ED Notes (Addendum)
Pt states that she fell 3 weeks ago and cut the bottom of her R leg and it is now reddened and swollen. Denies head injury. Alert and oriented.

## 2016-01-27 NOTE — ED Provider Notes (Signed)
CSN: [id number]     Arrival date & time 01/27/16  2101 History   First MD Initiated Contact with Patient 01/27/16 2200     Chief Complaint  Patient presents with  . Extremity Laceration  . Fall     (Consider location/radiation/quality/duration/timing/severity/associated sxs/prior Treatment) HPI Comments: 75yo F w/ PMH including HTN, PVD, GERD, T2DM who p/w R leg wound. 3 weeks ago, the patient states that she lost her balance and fell, sliding down onto the floor. She did not strike her head or lose consciousness. She did sustain a cut to her anterior right lower leg which she has been treating with Neosporin. Approximately 2 weeks ago, the cut became more red and swollen. For the past week, it has been draining some liquid. She feels like her leg is warm and she reports mild pain. She states that her legs are more swollen than usual. She denies any fevers. She reports mild cough/cold symptoms. No chest pain. She states she has chronic shortness of breath with exertion that seems to be worse recently. No changes in her weight. No recent travel, OCP use, h/o blood clots, or h/o cancer.  Patient is a 75 y.o. female presenting with fall. The history is provided by the patient.  Fall    Past Medical History  Diagnosis Date  . Hypertension   . Glaucoma   . Dysrhythmia   . Shortness of breath     walk a long way  . Peripheral vascular disease ([place])   . GERD (gastroesophageal reflux disease)   . Arthritis   . H/O hiatal hernia   . Diabetes mellitus without complication [facility])    Past Surgical History  Procedure Laterality Date  . Eye surgery    . Joint replacement      hip replacement  . Arthroplasty  03/09/2012    lt hip  . Total hip arthroplasty  03/09/2012    Procedure: TOTAL HIP ARTHROPLASTY;  Surgeon: Sharmon Revere, MD;  Location: [place];  Service: Orthopedics;  Laterality: Left;  . Left heart catheterization with coronary angiogram N/A 05/29/2014    Procedure: LEFT HEART  CATHETERIZATION WITH CORONARY ANGIOGRAM;  Surgeon: Laverda Page, MD;  Location: [facility] CATH LAB;  Service: Cardiovascular;  Laterality: N/A;   History reviewed. No pertinent family history. Social History  Substance Use Topics  . Smoking status: Never Smoker   . Smokeless tobacco: Never Used  . Alcohol Use: No   OB History    No data available     Review of Systems 10 Systems reviewed and are negative for acute change except as noted in the HPI.    Allergies  Codeine  Home Medications   Prior to Admission medications   Medication Sig Start Date End Date Taking? Authorizing Provider  acetaminophen (TYLENOL) 500 MG tablet Take 500 mg by mouth every 6 (six) hours as needed for mild pain.   Yes Historical Provider, MD  amLODipine (NORVASC) 10 MG tablet Take 10 mg by mouth daily.     Yes Historical Provider, MD  aspirin EC 81 MG tablet Take 81 mg by mouth daily.   Yes Historical Provider, MD  bimatoprost (LUMIGAN) 0.01 % SOLN Place 1 drop into both eyes at bedtime.     Yes Historical Provider, MD  brimonidine-timolol (COMBIGAN) 0.2-0.5 % ophthalmic solution Place 1 drop into both eyes every 12 (twelve) hours.     Yes Historical Provider, MD  cilostazol (PLETAL) 100 MG tablet Take 100 mg by mouth 2 (two)  times daily.   Yes Historical Provider, MD  dorzolamide (TRUSOPT) 2 % ophthalmic solution Place 1 drop into both eyes 2 (two) times daily.   Yes Historical Provider, MD  cephALEXin (KEFLEX) 500 MG capsule Take 1 capsule (500 mg total) by mouth 3 (three) times daily. 09/25/15   Dorie Rank, MD  naproxen (NAPROSYN) 500 MG tablet Take 1 tablet (500 mg total) by mouth 2 (two) times daily. Patient not taking: Reported on 09/25/2015 09/22/15   Ezequiel Essex, MD   BP 153/80 mmHg  Pulse 87  Temp(Src) 98.1 F (36.7 C) (Oral)  Resp 23  SpO2 98% Physical Exam  Constitutional: She is oriented to person, place, and time. She appears well-developed and well-nourished. No distress.  HENT:   Head: Normocephalic and atraumatic.  Moist mucous membranes  Eyes: Conjunctivae are normal. Pupils are equal, round, and reactive to light.  Neck: Neck supple.  Cardiovascular: Normal rate, regular rhythm, normal heart sounds and intact distal pulses.   No murmur heard. Pulmonary/Chest: Effort normal and breath sounds normal.  Abdominal: Soft. Bowel sounds are normal. She exhibits no distension. There is no tenderness.  Musculoskeletal: She exhibits edema.  2+ pitting edema BLE to knees  Neurological: She is alert and oriented to person, place, and time.  Fluent speech; normal sensation feet  Skin: Skin is warm.  Erythema and warmth of BLE to just below knees; small circular wound on anterior L lower leg; ulcerative wound draining clear yellow fluid on anterior L lower leg, mid-shin  Psychiatric: She has a normal mood and affect. Judgment normal.  Nursing note and vitals reviewed.   ED Course  Procedures (including critical care time) Labs Review Labs Reviewed  COMPREHENSIVE METABOLIC PANEL - Abnormal; Notable for the following:    Sodium 133 (*)    Glucose, Bld 115 (*)    BUN 22 (*)    Calcium 8.8 (*)    Albumin 3.3 (*)    GFR calc non Af Amer 54 (*)    All other components within normal limits  CBC WITH DIFFERENTIAL/PLATELET - Abnormal; Notable for the following:    WBC 11.3 (*)    Hemoglobin 10.3 (*)    HCT 31.6 (*)    RDW 16.0 (*)    Neutro Abs 8.3 (*)    All other components within normal limits  SEDIMENTATION RATE - Abnormal; Notable for the following:    Sed Rate 99 (*)    All other components within normal limits  BRAIN NATRIURETIC PEPTIDE  I-STAT TROPOININ, ED    Imaging Review Dg Chest 2 View  01/27/2016  CLINICAL DATA:  Shortness of breath for 24 hours.  Fall 3 weeks ago. EXAM: CHEST  2 VIEW COMPARISON:  09/25/2015 FINDINGS: The patient is rotated to the left on today's radiograph, reducing diagnostic sensitivity and specificity. Atherosclerotic aortic arch.  Thoracic spondylosis. The lungs appear clear. Accounting for the rotation and projection, heart size is within normal limits. IMPRESSION: 1. Atherosclerosis. 2. Thoracic spondylosis. 3. No acute findings. Electronically Signed   By: Van Clines M.D.   On: 01/27/2016 23:02   Dg Tibia/fibula Right  01/27/2016  CLINICAL DATA:  Laceration on the right lower leg, concern for infection. EXAM: RIGHT TIBIA AND FIBULA - 2 VIEW COMPARISON:  09/22/2015 FINDINGS: There is diffuse mild subcutaneous edema along the lower leg. Severe osteoarthritis of the knee. Vascular calcifications noted. No gas tracking in the soft tissues. No findings of osteomyelitis of the tibia or fibula. I do not see a  definite foreign body. Mild bony demineralization. Plantar and Achilles calcaneal spurs. IMPRESSION: 1. Mild diffuse subcutaneous edema in the calf. No foreign body or gas in the soft tissues identified. 2. Severe osteoarthritis of the knee. 3. Bony demineralization. Electronically Signed   By: Van Clines M.D.   On: 01/27/2016 23:03   I have personally reviewed and evaluated these lab results as part of my medical decision-making.   EKG Interpretation   Date/Time:  Monday Jan 27 2016 22:27:02 EDT Ventricular Rate:  79 PR Interval:  145 QRS Duration: 111 QT Interval:  422 QTC Calculation: 484 R Axis:   65 Text Interpretation:  Sinus rhythm Minimal ST elevation, anterior leads No  significant change since last tracing Confirmed by Rabiah Goeser MD, Olaolu[place] Grieder  [id number]) on 01/27/2016 10:30:40 PM      MDM   Final diagnoses:  Cellulitis of leg, right  PVD (peripheral vascular disease) ([place])   Pt p/w cut after falling 3 weeks ago that has become more red and swollen over the past 1-2 weeks. Patient was comfortable on exam. Vital signs notable for mild hypertension. She had significant edema and mild erythema and warmth of both lower legs to knees. She had an area of ulceration and clear drainage from her anterior  right lower leg. Obtained above lab work including ESR, BNP. Also obtained CXR and XR R leg.   Labwork shows WBC 11.3, ESR elevated at 99, normal creatinine, glucose 115. Plain films of leg show diffuse swelling without any evidence of osteomyelitis. Chest x-ray negative acute. BNP normal and EKG unchanged from previous. Reviewed the patient's chart which shows that she has had exertional shortness of breath in the past. I see no evidence of PNA or CHF. Unable to obtain DVT at this time. I am concerned about infection given her leukocytosis and elevated ESR. With h/o PVD and T2DM, pt at high risk of complications from cellulitis. Gave dose of zosyn and discussed admission w/ Dr.  Alcario Drought, and pt admitted for further care.  Sharlett Iles, MD 01/28/16 [phone]

## 2016-01-27 NOTE — ED Notes (Signed)
Pt in X-Ray ?

## 2016-01-28 ENCOUNTER — Observation Stay (HOSPITAL_COMMUNITY): Payer: Medicare Other

## 2016-01-28 ENCOUNTER — Encounter (HOSPITAL_COMMUNITY): Payer: Self-pay | Admitting: *Deleted

## 2016-01-28 DIAGNOSIS — E119 Type 2 diabetes mellitus without complications: Secondary | ICD-10-CM | POA: Diagnosis present

## 2016-01-28 DIAGNOSIS — L03115 Cellulitis of right lower limb: Secondary | ICD-10-CM | POA: Diagnosis not present

## 2016-01-28 DIAGNOSIS — Z9181 History of falling: Secondary | ICD-10-CM | POA: Diagnosis not present

## 2016-01-28 DIAGNOSIS — K2211 Ulcer of esophagus with bleeding: Secondary | ICD-10-CM | POA: Diagnosis not present

## 2016-01-28 DIAGNOSIS — S80811A Abrasion, right lower leg, initial encounter: Secondary | ICD-10-CM | POA: Diagnosis present

## 2016-01-28 DIAGNOSIS — R609 Edema, unspecified: Secondary | ICD-10-CM | POA: Diagnosis present

## 2016-01-28 DIAGNOSIS — I82409 Acute embolism and thrombosis of unspecified deep veins of unspecified lower extremity: Secondary | ICD-10-CM | POA: Diagnosis not present

## 2016-01-28 DIAGNOSIS — R5381 Other malaise: Secondary | ICD-10-CM | POA: Diagnosis not present

## 2016-01-28 DIAGNOSIS — I878 Other specified disorders of veins: Secondary | ICD-10-CM | POA: Diagnosis not present

## 2016-01-28 DIAGNOSIS — Z885 Allergy status to narcotic agent status: Secondary | ICD-10-CM | POA: Diagnosis not present

## 2016-01-28 DIAGNOSIS — I5032 Chronic diastolic (congestive) heart failure: Secondary | ICD-10-CM | POA: Diagnosis not present

## 2016-01-28 DIAGNOSIS — K219 Gastro-esophageal reflux disease without esophagitis: Secondary | ICD-10-CM | POA: Diagnosis present

## 2016-01-28 DIAGNOSIS — Z7982 Long term (current) use of aspirin: Secondary | ICD-10-CM | POA: Diagnosis not present

## 2016-01-28 DIAGNOSIS — I10 Essential (primary) hypertension: Secondary | ICD-10-CM | POA: Diagnosis not present

## 2016-01-28 DIAGNOSIS — I739 Peripheral vascular disease, unspecified: Secondary | ICD-10-CM | POA: Diagnosis present

## 2016-01-28 DIAGNOSIS — M7989 Other specified soft tissue disorders: Secondary | ICD-10-CM | POA: Diagnosis not present

## 2016-01-28 DIAGNOSIS — Z79899 Other long term (current) drug therapy: Secondary | ICD-10-CM | POA: Diagnosis not present

## 2016-01-28 DIAGNOSIS — R0600 Dyspnea, unspecified: Secondary | ICD-10-CM | POA: Diagnosis not present

## 2016-01-28 DIAGNOSIS — W1830XA Fall on same level, unspecified, initial encounter: Secondary | ICD-10-CM | POA: Diagnosis present

## 2016-01-28 DIAGNOSIS — S81811A Laceration without foreign body, right lower leg, initial encounter: Secondary | ICD-10-CM | POA: Diagnosis present

## 2016-01-28 DIAGNOSIS — K922 Gastrointestinal hemorrhage, unspecified: Secondary | ICD-10-CM | POA: Diagnosis not present

## 2016-01-28 DIAGNOSIS — R0609 Other forms of dyspnea: Secondary | ICD-10-CM | POA: Diagnosis present

## 2016-01-28 LAB — BASIC METABOLIC PANEL
Anion gap: 6 (ref 5–15)
BUN: 18 mg/dL (ref 6–20)
CHLORIDE: 108 mmol/L (ref 101–111)
CO2: 25 mmol/L (ref 22–32)
Calcium: 8.6 mg/dL — ABNORMAL LOW (ref 8.9–10.3)
Creatinine, Ser: 0.72 mg/dL (ref 0.44–1.00)
GFR calc Af Amer: >60 mL/min (ref >60)
GFR calc non Af Amer: >60 mL/min (ref >60)
GLUCOSE: 118 mg/dL — AB (ref 65–99)
POTASSIUM: 4.1 mmol/L (ref 3.5–5.1)
Sodium: 139 mmol/L (ref 135–145)

## 2016-01-28 LAB — CBC
HEMATOCRIT: 29.1 % — AB (ref 36.0–46.0)
Hemoglobin: 9.4 g/dL — ABNORMAL LOW (ref 12.0–15.0)
MCH: 26.2 pg (ref 26.0–34.0)
MCHC: 32.3 g/dL (ref 30.0–36.0)
MCV: 81.1 fL (ref 78.0–100.0)
Platelets: 270 10*3/uL (ref 150–400)
RBC: 3.59 MIL/uL — ABNORMAL LOW (ref 3.87–5.11)
RDW: 15.9 % — ABNORMAL HIGH (ref 11.5–15.5)
WBC: 10.5 10*3/uL (ref 4.0–10.5)

## 2016-01-28 LAB — BRAIN NATRIURETIC PEPTIDE: B NATRIURETIC PEPTIDE 5: 25.6 pg/mL (ref 0.0–100.0)

## 2016-01-28 MED ORDER — BRIMONIDINE TARTRATE-TIMOLOL 0.2-0.5 % OP SOLN: 1.0000 [drp] | Freq: Two times a day (BID) | OPHTHALMIC | Status: DC

## 2016-01-28 MED ORDER — CILOSTAZOL 100 MG PO TABS
100.0000 mg | ORAL_TABLET | Freq: Two times a day (BID) | ORAL | Status: DC
  Administered 2016-01-28 – 2016-01-30 (×3): 100 mg via ORAL
  Filled 2016-01-28 (×6): qty 1

## 2016-01-28 MED ORDER — ENOXAPARIN SODIUM 40 MG/0.4ML ~~LOC~~ SOLN
40.0000 mg | SUBCUTANEOUS | Status: DC
  Administered 2016-01-28 – 2016-01-30 (×3): 40 mg via SUBCUTANEOUS
  Filled 2016-01-28 (×3): qty 0.4

## 2016-01-28 MED ORDER — ASPIRIN EC 81 MG PO TBEC
81.0000 mg | DELAYED_RELEASE_TABLET | Freq: Every day | ORAL | Status: DC
  Administered 2016-01-28 – 2016-01-30 (×3): 81 mg via ORAL
  Filled 2016-01-28 (×3): qty 1

## 2016-01-28 MED ORDER — KETOROLAC TROMETHAMINE 15 MG/ML IJ SOLN
15.0000 mg | Freq: Three times a day (TID) | INTRAMUSCULAR | Status: DC | PRN
  Administered 2016-01-28 – 2016-01-29 (×3): 15 mg via INTRAVENOUS
  Filled 2016-01-28 (×3): qty 1

## 2016-01-28 MED ORDER — DORZOLAMIDE HCL 2 % OP SOLN
1.0000 [drp] | Freq: Two times a day (BID) | OPHTHALMIC | Status: DC
  Administered 2016-01-28 – 2016-01-30 (×5): 1 [drp] via OPHTHALMIC
  Filled 2016-01-28: qty 10

## 2016-01-28 MED ORDER — TRAMADOL HCL 50 MG PO TABS
50.0000 mg | ORAL_TABLET | Freq: Four times a day (QID) | ORAL | Status: DC | PRN
  Administered 2016-01-28 – 2016-01-30 (×3): 50 mg via ORAL
  Filled 2016-01-28 (×3): qty 1

## 2016-01-28 MED ORDER — ACETAMINOPHEN 500 MG PO TABS
500.0000 mg | ORAL_TABLET | Freq: Four times a day (QID) | ORAL | Status: DC | PRN
  Administered 2016-01-28 (×2): 500 mg via ORAL
  Filled 2016-01-28 (×2): qty 1

## 2016-01-28 MED ORDER — AMLODIPINE BESYLATE 10 MG PO TABS
10.0000 mg | ORAL_TABLET | Freq: Every day | ORAL | Status: DC
  Administered 2016-01-28 – 2016-01-30 (×3): 10 mg via ORAL
  Filled 2016-01-28 (×3): qty 1

## 2016-01-28 MED ORDER — HYDROCHLOROTHIAZIDE 25 MG PO TABS
25.0000 mg | ORAL_TABLET | Freq: Every day | ORAL | Status: DC
Start: 2016-01-28 — End: 2016-01-30
  Administered 2016-01-28 – 2016-01-30 (×3): 25 mg via ORAL
  Filled 2016-01-28 (×4): qty 1

## 2016-01-28 MED ORDER — BRIMONIDINE TARTRATE 0.2 % OP SOLN
1.0000 [drp] | Freq: Two times a day (BID) | OPHTHALMIC | Status: DC
  Administered 2016-01-28 – 2016-01-30 (×6): 1 [drp] via OPHTHALMIC
  Filled 2016-01-28: qty 5

## 2016-01-28 MED ORDER — TIMOLOL MALEATE 0.5 % OP SOLN
1.0000 [drp] | Freq: Two times a day (BID) | OPHTHALMIC | Status: DC
  Administered 2016-01-28 – 2016-01-30 (×6): 1 [drp] via OPHTHALMIC
  Filled 2016-01-28: qty 5

## 2016-01-28 MED ORDER — LATANOPROST 0.005 % OP SOLN
1.0000 [drp] | Freq: Every day | OPHTHALMIC | Status: DC
  Administered 2016-01-28 – 2016-01-29 (×2): 1 [drp] via OPHTHALMIC
  Filled 2016-01-28: qty 2.5

## 2016-01-28 MED ORDER — CEFTRIAXONE SODIUM 1 G IJ SOLR
1.0000 g | INTRAMUSCULAR | Status: DC
  Administered 2016-01-28 – 2016-01-30 (×3): 1 g via INTRAVENOUS
  Filled 2016-01-28 (×3): qty 10

## 2016-01-28 NOTE — H&P (Signed)
History and Physical    Brenda Kerr WFU:[id number] DOB: 09-27-1940 DOA: 01/27/2016   PCP: Elyn Peers, MD Chief Complaint:  Chief Complaint  Patient presents with  . Extremity Laceration  . Fall    HPI: Brenda Kerr is a 75 y.o. female with medical history significant of HTN, PVD.  Patient presents to the ED with c/o RLE erythema, wound.  Patient lost balance 3 weeks ago, fell sliding to floor, did have a cut to anterior RLE which she has been treating with neosporin.  2 weeks ago this became more red and swollen and started draining clear fluid.  Mild pain, legs are more swollen than usual.  Review of Systems: As per HPI otherwise 10 point review of systems negative.    Past Medical History  Diagnosis Date  . Hypertension   . Glaucoma   . Dysrhythmia   . Shortness of breath     walk a long way  . Peripheral vascular disease ([place])   . GERD (gastroesophageal reflux disease)   . Arthritis   . H/O hiatal hernia   . Diabetes mellitus without complication [facility])     Past Surgical History  Procedure Laterality Date  . Eye surgery    . Joint replacement      hip replacement  . Arthroplasty  03/09/2012    lt hip  . Total hip arthroplasty  03/09/2012    Procedure: TOTAL HIP ARTHROPLASTY;  Surgeon: Sharmon Revere, MD;  Location: [facility];  Service: Orthopedics;  Laterality: Left;  . Left heart catheterization with coronary angiogram N/A 05/29/2014    Procedure: LEFT HEART CATHETERIZATION WITH CORONARY ANGIOGRAM;  Surgeon: Laverda Page, MD;  Location: [facility] CATH LAB;  Service: Cardiovascular;  Laterality: N/A;     reports that she has never smoked. She has never used smokeless tobacco. She reports that she does not drink alcohol or use illicit drugs.  Allergies  Allergen Reactions  . Codeine Nausea And Vomiting    History reviewed. No pertinent family history.   Prior to Admission medications   Medication Sig Start Date End Date Taking? Authorizing Provider    acetaminophen (TYLENOL) 500 MG tablet Take 500 mg by mouth every 6 (six) hours as needed for mild pain.   Yes Historical Provider, MD  amLODipine (NORVASC) 10 MG tablet Take 10 mg by mouth daily.     Yes Historical Provider, MD  aspirin EC 81 MG tablet Take 81 mg by mouth daily.   Yes Historical Provider, MD  bimatoprost (LUMIGAN) 0.01 % SOLN Place 1 drop into both eyes at bedtime.     Yes Historical Provider, MD  brimonidine-timolol (COMBIGAN) 0.2-0.5 % ophthalmic solution Place 1 drop into both eyes every 12 (twelve) hours.     Yes Historical Provider, MD  cilostazol (PLETAL) 100 MG tablet Take 100 mg by mouth 2 (two) times daily.   Yes Historical Provider, MD  dorzolamide (TRUSOPT) 2 % ophthalmic solution Place 1 drop into both eyes 2 (two) times daily.   Yes Historical Provider, MD    Physical Exam: Filed Vitals:   01/27/16 2230 01/27/16 2300 01/27/16 2334 01/28/16 0000  BP: 150/65 159/71 153/80 153/71  Pulse: 86 83 87 86  Temp:      TempSrc:      Resp: _0 SpO2: 99% 98% 98% 98%      Constitutional: NAD, calm, comfortable Eyes: PERRL, lids and conjunctivae normal ENMT: Mucous membranes are moist. Posterior pharynx clear of any exudate  or lesions.Normal dentition.  Neck: normal, supple, no masses, no thyromegaly Respiratory: clear to auscultation bilaterally, no wheezing, no crackles. Normal respiratory effort. No accessory muscle use.  Cardiovascular: Regular rate and rhythm, no murmurs / rubs / gallops. No extremity edema. 2+ pedal pulses. No carotid bruits.  Abdomen: no tenderness, no masses palpated. No hepatosplenomegaly. Bowel sounds positive.  Musculoskeletal: no clubbing / cyanosis. No joint deformity upper and lower extremities. Good ROM, no contractures. Normal muscle tone.  Skin: BLE erythema, edema, on both shins.  Has abrasion to R shin Neurologic: CN 2-12 grossly intact. Sensation intact, DTR normal. Strength 5/5 in all 4.  Psychiatric: Normal judgment and  insight. Alert and oriented x 3. Normal mood.    Labs on Admission: I have personally reviewed following labs and imaging studies  CBC:  Recent Labs Lab 01/27/16 2222  WBC 11.3*  NEUTROABS 8.3*  HGB 10.3*  HCT 31.6*  MCV 81.2  PLT 224   Basic Metabolic Panel:  Recent Labs Lab 01/27/16 2222  NA 133*  K 3.9  CL 101  CO2 25  GLUCOSE 115*  BUN 22*  CREATININE 1.00  CALCIUM 8.8*   GFR: CrCl cannot be calculated (Unknown ideal weight.). Liver Function Tests:  Recent Labs Lab 01/27/16 2222  AST 20  ALT 16  ALKPHOS 85  BILITOT 0.6  PROT 7.2  ALBUMIN 3.3*   No results for input(s): LIPASE, AMYLASE in the last 168 hours. No results for input(s): AMMONIA in the last 168 hours. Coagulation Profile: No results for input(s): INR, PROTIME in the last 168 hours. Cardiac Enzymes: No results for input(s): CKTOTAL, CKMB, CKMBINDEX, TROPONINI in the last 168 hours. BNP (last 3 results) No results for input(s): PROBNP in the last 8760 hours. HbA1C: No results for input(s): HGBA1C in the last 72 hours. CBG: No results for input(s): GLUCAP in the last 168 hours. Lipid Profile: No results for input(s): CHOL, HDL, LDLCALC, TRIG, CHOLHDL, LDLDIRECT in the last 72 hours. Thyroid Function Tests: No results for input(s): TSH, T4TOTAL, FREET4, T3FREE, THYROIDAB in the last 72 hours. Anemia Panel: No results for input(s): VITAMINB12, FOLATE, FERRITIN, TIBC, IRON, RETICCTPCT in the last 72 hours. Urine analysis:    Component Value Date/Time   COLORURINE YELLOW 03/04/2012 1320   APPEARANCEUR CLEAR 03/04/2012 1320   LABSPEC 1.008 03/04/2012 1320   PHURINE 5.5 03/04/2012 1320   GLUCOSEU NEGATIVE 03/04/2012 1320   HGBUR NEGATIVE 03/04/2012 1320   BILIRUBINUR NEGATIVE 03/04/2012 1320   KETONESUR NEGATIVE 03/04/2012 1320   PROTEINUR NEGATIVE 03/04/2012 1320   UROBILINOGEN 0.2 03/04/2012 1320   NITRITE NEGATIVE 03/04/2012 1320   LEUKOCYTESUR NEGATIVE 03/04/2012 1320   Sepsis  Labs: _0 (procalcitonin:4,lacticidven:4) )No results found for this or any previous visit (from the past 240 hour(s)).   Radiological Exams on Admission: Dg Chest 2 View  01/27/2016  CLINICAL DATA:  Shortness of breath for 24 hours.  Fall 3 weeks ago. EXAM: CHEST  2 VIEW COMPARISON:  09/25/2015 FINDINGS: The patient is rotated to the left on today's radiograph, reducing diagnostic sensitivity and specificity. Atherosclerotic aortic arch. Thoracic spondylosis. The lungs appear clear. Accounting for the rotation and projection, heart size is within normal limits. IMPRESSION: 1. Atherosclerosis. 2. Thoracic spondylosis. 3. No acute findings. Electronically Signed   By: Van Clines M.D.   On: 01/27/2016 23:02   Dg Tibia/fibula Right  01/27/2016  CLINICAL DATA:  Laceration on the right lower leg, concern for infection. EXAM: RIGHT TIBIA AND FIBULA - 2 VIEW COMPARISON:  09/22/2015  FINDINGS: There is diffuse mild subcutaneous edema along the lower leg. Severe osteoarthritis of the knee. Vascular calcifications noted. No gas tracking in the soft tissues. No findings of osteomyelitis of the tibia or fibula. I do not see a definite foreign body. Mild bony demineralization. Plantar and Achilles calcaneal spurs. IMPRESSION: 1. Mild diffuse subcutaneous edema in the calf. No foreign body or gas in the soft tissues identified. 2. Severe osteoarthritis of the knee. 3. Bony demineralization. Electronically Signed   By: Van Clines M.D.   On: 01/27/2016 23:03    EKG: Independently reviewed.  Assessment/Plan Principal Problem:   Cellulitis of right anterior lower leg Active Problems:   HTN (hypertension)   Cellulitis of RLE -  Cellulitis pathway, will put on rocephin due to slightly elevated WBC and ESR.  No other evidence of sepsis to suggest need for broader spectrum ABx.  Of note she last had cellulitis of leg in jan of this year, this was successfully cleared up with Keflex at that  time.  HTN - continue home HTN meds   DVT prophylaxis: Lovenox Code Status: Full Family Communication: Family at bedside Consults called: None Admission status: Admit to obs   Tiernan Suto, [place] [facility] [facility] Pager [phone] from 7PM-7AM  If 7AM-7PM, please contact the day physician for the patient [email or web address] Password [email or web address]  01/28/2016, 12:38 AM

## 2016-01-28 NOTE — Care Management Obs Status (Signed)
[facility] NOTIFICATION   Patient Details  Name: Brenda Kerr MRN: [id number] Date of Birth: [birth year]   Medicare Observation Status Notification Given:  Yes    Leeroy Cha, RN 01/28/2016, 2:25 PM

## 2016-01-28 NOTE — Evaluation (Addendum)
Physical Therapy Evaluation Patient Details Name: Brenda Kerr MRN: [id number] DOB: Jul 03, 1941 Today's Date: 01/28/2016   History of Present Illness  Mrs. Danby is a 75 year old female with a past medical history of hypertension diabetes mellitus, admitted this morning for right lower extremity cellulitis. She reports having recurrent falls over the last several months, having her last fall about 2-1/2 weeks ago. She stated injuring her right shin and noticed worsening erythema and pain to the area--> found to have cellulitis RLE  Clinical Impression  Pt admitted with above diagnosis. Pt currently with functional limitations due to the deficits listed below (see PT Problem List).  Pt will benefit from skilled PT to increase their independence and safety with mobility to allow discharge to the venue listed below.   Activity/amb  limited  Today d/t pain with WBing bil LEs d/t edema, pt with mild dyspnea after amb ~50'; will continue to follow; recommend HHPT and new, petite RW for home--pt reports that hers is at least 76 years old;      Follow Up Recommendations Home health PT    Equipment Recommendations  Rolling walker with 5" wheels (pt has a RW that is ~76yrs old, she says it is "worn out")    Recommendations for Other Services       Precautions / Restrictions Precautions Precautions: Fall Restrictions Weight Bearing Restrictions: No      Mobility  Bed Mobility Overal bed mobility: Needs Assistance Bed Mobility: Sit to Supine       Sit to supine: Supervision   General bed mobility comments: incr time, effortful transition, cues for task completetionwithout physical assist  Transfers Overall transfer level: Needs assistance Equipment used: Rolling walker (2 wheeled) Transfers: Sit to/from Stand Sit to Stand: Min guard         General transfer comment: cues for hand placement and safe technique  Ambulation/Gait Ambulation/Gait assistance: Min guard;Min  assist Ambulation Distance (Feet): 50 Feet Assistive device: Rolling walker (2 wheeled) Gait Pattern/deviations: Step-through pattern;Decreased stride length;Wide base of support     General Gait Details: incr time, frequent and repetitive cues for RW distance from self and posture  Stairs            Wheelchair Mobility    Modified Rankin (Stroke Patients Only)       Balance Overall balance assessment: History of Falls;Needs assistance   Sitting balance-Leahy Scale: Good       Standing balance-Leahy Scale: Poor Standing balance comment: pt is reliant on UEs for static standing--LEs are more painful with WBing                             Pertinent Vitals/Pain Pain Assessment: Faces Faces Pain Scale: Hurts little more Pain Location: LEs Pain Descriptors / Indicators: Sore;Tightness Pain Intervention(s): Monitored during session;Limited activity within patient's tolerance;Premedicated before session;Repositioned (elevated LEs in bed after amb; pt on EOB on PT arrival)    Home Living Family/patient expects to be discharged to:: Private residence Living Arrangements: Spouse/significant other Available Help at Discharge: Family Type of Home: House Home Access: Level entry     Home Layout: One level Home Equipment: Environmental consultant - 2 wheels;Cane - single point      Prior Function Level of Independence: Independent with assistive device(s)         Comments: amb with RW     Hand Dominance        Extremity/Trunk Assessment   Upper Extremity Assessment: Overall  WFL for tasks assessed           Lower Extremity Assessment: Generalized weakness (bil LEs edematous and "stiff" R>L)         Communication   Communication: No difficulties  Cognition Arousal/Alertness: Awake/alert Behavior During Therapy: WFL for tasks assessed/performed Overall Cognitive Status: Within Functional Limits for tasks assessed                      General  Comments      Exercises General Exercises - Lower Extremity Ankle Circles/Pumps: AROM;Both;10 reps      Assessment/Plan    PT Assessment Patient needs continued PT services  PT Diagnosis Difficulty walking   PT Problem List Decreased strength;Decreased range of motion;Decreased activity tolerance;Decreased balance;Decreased mobility;Pain  PT Treatment Interventions DME instruction;Functional mobility training;Gait training;Therapeutic activities;Therapeutic exercise   PT Goals (Current goals can be found in the Care Plan section) Acute Rehab PT Goals Patient Stated Goal: get better, less leg pain PT Goal Formulation: With patient Time For Goal Achievement: 02/04/16 Potential to Achieve Goals: Good    Frequency Min 3X/week   Barriers to discharge        Co-evaluation               End of Session Equipment Utilized During Treatment: Gait belt Activity Tolerance: Patient tolerated treatment well;Patient limited by fatigue Patient left: with call bell/phone within reach;in bed;with bed alarm set      Functional Assessment Tool Used: clinical judgement Functional Limitation: Mobility: Walking and moving around Mobility: Walking and Moving Around Current Status [id number]): At least 1 percent but less than 20 percent impaired, limited or restricted Mobility: Walking and Moving Around Goal Status [phone]): At least 1 percent but less than 20 percent impaired, limited or restricted    Time: 1335-1352 PT Time Calculation (min) (ACUTE ONLY): 17 min   Charges:   PT Evaluation $PT Eval Low Complexity: 1 Procedure     PT G Codes:   PT G-Codes **NOT FOR INPATIENT CLASS** Functional Assessment Tool Used: clinical judgement Functional Limitation: Mobility: Walking and moving around Mobility: Walking and Moving Around Current Status [id number]): At least 1 percent but less than 20 percent impaired, limited or restricted Mobility: Walking and Moving Around Goal Status [phone]): At  least 1 percent but less than 20 percent impaired, limited or restricted    [facility] 01/28/2016, 2:34 PM

## 2016-01-28 NOTE — Progress Notes (Signed)
PROGRESS NOTE    Brenda Kerr  [id number] DOB: Jun 23, 1941 DOA: 01/27/2016 PCP: Elyn Peers, MD   Brief Narrative: Brenda [place] 75[place]year[place] female with a past medical history of hypertension diabetes mellitus, admitted this morning for right lower extremity cellulitis. She reports having recurrent falls over the last several months, having her last fall about 2-1/2 weeks ago. She stated injuring her right shin and noticed worsening erythema and pain to the area several days later. Symptoms have progressively worsened for which she presented to the emergency department overnight. She was found to have cellulitis and started on antibiotic therapy.   Assessment & Plan:   Principal Problem:   Cellulitis of right anterior lower leg Active Problems:   HTN (hypertension)  1.  Right lower extremity cellulitis -Brenda Kerr reporting having a fall about 2 and half weeks ago that resulted in an injury to her right shin. -Since then she has noted increasing erythema, pain, swelling involving her right lower extremity. -Symptoms appear consistent with cellulitis, was started on IV ceftriaxone overnight.  2.  History recurrent falls -She reports having recurrent falls over the past several months, most recently resulting in injury to her right leg complicated by infection. -X-ray of right tibia/fibula show diffuse subcutaneous edema without evidence of fracture -She feels that increase in edema involving lower extremities have contributed to her falls and feels that she has gained weight. She has so reports having exertional dyspnea. -Physical therapy consulted  3.  Exertional dyspnea. -She reported having exertional dyspnea along with increasing bilateral extremity pitting edema and feels that this has contributed to her falls. -Will check a BNP and 2D echo  4. Hypertension. -Patient's blood pressure is elevated with systolic blood pressures [id number] to 160s. -She is on amlodipine 10  mg by mouth daily, will add hydrochlorothiazide 25 mg by mouth daily to her regimen   DVT prophylaxis: Lovenox Code Status: Full code Family Communication: I spoke with her daughter was present at bedside Disposition Plan: Physical therapy consultation, and history discharged home with medically stable possibly in the next 24-48 hrs.   Consultants:   Physical therapy  Antimicrobials:  Ceftriaxone 1 g IV every 24 hours  Subjective: She was ambulated down the hallway with a walker, having dyspnea on exertion.  Objective: Filed Vitals:   01/28/16 0000 01/28/16 0100 01/28/16 0130 01/28/16 0443  BP: 153/71  124/61 154/69  Pulse: 86  81 86  Temp:   98 F (36.7 C) 98 F (36.7 C)  TempSrc:   Oral Oral  Resp: 20  22 20   Height:  5\' 2"  (1.575 m)    Weight:  93.5 kg (206 lb 2.1 oz)    SpO2: 98%  100% 99%    Intake/Output Summary (Last 24 hours) at 01/28/16 1240 Last data filed at 01/28/16 0900  Gross per 24 hour  Intake    120 ml  Output      0 ml  Net    120 ml   Filed Weights   01/28/16 0100  Weight: 93.5 kg (206 lb 2.1 oz)    Examination:  General exam: No acute distress, nontoxic-appearing Respiratory system: Clear to auscultation. Respiratory effort normal. Cardiovascular system: S1 & S2 heard, RRR. No JVD, murmurs, rubs, gallops or clicks. No pedal edema. Gastrointestinal system: Abdomen is nondistended, soft and nontender. No organomegaly or masses felt. Normal bowel sounds heard. Central nervous system: Alert and oriented. No focal neurological deficits. Extremities: Symmetric 5 x 5 power. Skin: There  is an abrasion over the anterior shin of right lower extremity, surrounding erythema and swelling Psychiatry: Judgement and insight appear normal. Mood & affect appropriate.     Data Reviewed: I have personally reviewed following labs and imaging studies  CBC:  Recent Labs Lab 01/27/16 2222 01/28/16 0453  WBC 11.3* 10.5  NEUTROABS 8.3*  --   HGB 10.3*  9.4*  HCT 31.6* 29.1*  MCV 81.2 81.1  PLT 314 [id number]   Basic Metabolic Panel:  Recent Labs Lab 01/27/16 2222 01/28/16 0453  NA 133* 139  K 3.9 4.1  CL 101 108  CO2 25 25  GLUCOSE 115* 118*  BUN 22* 18  CREATININE 1.00 0.72  CALCIUM 8.8* 8.6*   GFR: Estimated Creatinine Clearance: 64.7 mL/min (by C-G formula based on Cr of 0.72). Liver Function Tests:  Recent Labs Lab 01/27/16 2222  AST 20  ALT 16  ALKPHOS 85  BILITOT 0.6  PROT 7.2  ALBUMIN 3.3*   No results for input(s): LIPASE, AMYLASE in the last 168 hours. No results for input(s): AMMONIA in the last 168 hours. Coagulation Profile: No results for input(s): INR, PROTIME in the last 168 hours. Cardiac Enzymes: No results for input(s): CKTOTAL, CKMB, CKMBINDEX, TROPONINI in the last 168 hours. BNP (last 3 results) No results for input(s): PROBNP in the last 8760 hours. HbA1C: No results for input(s): HGBA1C in the last 72 hours. CBG: No results for input(s): GLUCAP in the last 168 hours. Lipid Profile: No results for input(s): CHOL, HDL, LDLCALC, TRIG, CHOLHDL, LDLDIRECT in the last 72 hours. Thyroid Function Tests: No results for input(s): TSH, T4TOTAL, FREET4, T3FREE, THYROIDAB in the last 72 hours. Anemia Panel: No results for input(s): VITAMINB12, FOLATE, FERRITIN, TIBC, IRON, RETICCTPCT in the last 72 hours. Sepsis Labs: No results for input(s): PROCALCITON, LATICACIDVEN in the last 168 hours.  No results found for this or any previous visit (from the past 240 hour(s)).       Radiology Studies: Dg Chest 2 View  01/27/2016  CLINICAL DATA:  Shortness of breath for 24 hours.  Fall 3 weeks ago. EXAM: CHEST  2 VIEW COMPARISON:  09/25/2015 FINDINGS: The patient is rotated to the left on today's radiograph, reducing diagnostic sensitivity and specificity. Atherosclerotic aortic arch. Thoracic spondylosis. The lungs appear clear. Accounting for the rotation and projection, heart size is within normal limits.  IMPRESSION: 1. Atherosclerosis. 2. Thoracic spondylosis. 3. No acute findings. Electronically Signed   By: Van Clines M.D.   On: 01/27/2016 23:02   Dg Tibia/fibula Right  01/27/2016  CLINICAL DATA:  Laceration on the right lower leg, concern for infection. EXAM: RIGHT TIBIA AND FIBULA - 2 VIEW COMPARISON:  09/22/2015 FINDINGS: There is diffuse mild subcutaneous edema along the lower leg. Severe osteoarthritis of the knee. Vascular calcifications noted. No gas tracking in the soft tissues. No findings of osteomyelitis of the tibia or fibula. I do not see a definite foreign body. Mild bony demineralization. Plantar and Achilles calcaneal spurs. IMPRESSION: 1. Mild diffuse subcutaneous edema in the calf. No foreign body or gas in the soft tissues identified. 2. Severe osteoarthritis of the knee. 3. Bony demineralization. Electronically Signed   By: Van Clines M.D.   On: 01/27/2016 23:03        Scheduled Meds: . amLODipine  10 mg Oral Daily  . aspirin EC  81 mg Oral Daily  . brimonidine  1 drop Both Eyes BID  . cefTRIAXone (ROCEPHIN)  IV  1 g Intravenous Q24H  .  cilostazol  100 mg Oral BID  . dorzolamide  1 drop Both Eyes BID  . enoxaparin (LOVENOX) injection  40 mg Subcutaneous Q24H  . latanoprost  1 drop Both Eyes QHS  . timolol  1 drop Both Eyes BID   Continuous Infusions:       Time spent:     Kelvin Cellar, MD [facility] Pager [phone]  If 7PM-7AM, please contact night-coverage [email or web address] Password [email or web address] 01/28/2016, 12:40 PM

## 2016-01-28 NOTE — Progress Notes (Signed)
VASCULAR LAB PRELIMINARY  PRELIMINARY  PRELIMINARY  PRELIMINARY  Bilateral lower extremity venous duplex completed.    Preliminary report:  Bilateral:  No evidence of DVT, superficial thrombosis, or Baker's Cyst. Technically difficult to evaluate the peroneal vein bilaterally due to swelling.   Ephrem Carrick, RVS 01/28/2016, 10:35 AM

## 2016-01-28 NOTE — ED Notes (Signed)
Report called to 5E 

## 2016-01-29 ENCOUNTER — Inpatient Hospital Stay (HOSPITAL_COMMUNITY): Payer: Medicare Other

## 2016-01-29 DIAGNOSIS — R0600 Dyspnea, unspecified: Secondary | ICD-10-CM

## 2016-01-29 LAB — ECHOCARDIOGRAM COMPLETE
HEIGHTINCHES: 62 in
Weight: 3298.08 oz

## 2016-01-29 NOTE — Progress Notes (Signed)
  Echocardiogram 2D Echocardiogram has been performed.  Brenda Kerr 01/29/2016, 11:29 AM

## 2016-01-29 NOTE — Progress Notes (Signed)
PROGRESS NOTE    Brenda Kerr  [id number] DOB: November 12, 1940 DOA: 01/27/2016 PCP: Brenda Peers, MD   Brief Narrative: Bren[place] 75[place]year[place] female with a past medical history of hypertension diabetes mellitus, admitted this morning for right lower extremity cellulitis. She reports having recurrent falls over the last several months, having her last fall about 2-1/2 weeks ago. She stated injuring her right shin and noticed worsening erythema and pain to the area several days later. Symptoms have progressively worsened for which she presented to the emergency department overnight. She was found to have cellulitis and started on antibiotic therapy.   Assessment & Plan:   Principal Problem:   Cellulitis of right anterior lower leg Active Problems:   HTN (hypertension)   Cellulitis of right leg  1.  Right lower extremity cellulitis -Brenda Kerr reporting having a fall about 2 and half weeks ago that resulted in an injury to her right shin. -Since then she has noted increasing erythema, pain, swelling involving her right lower extremity. -Symptoms appear consistent with cellulitis- cont IV ceftriaxone   - she noted significant improvement in pain  2.  History recurrent falls -She reports having recurrent falls over the past several months, most recently resulting in injury to her right leg complicated by infection. -X-ray of right tibia/fibula show diffuse subcutaneous edema without evidence of fracture -She feels that increase in edema involving lower extremities have contributed to her falls and feels that she has gained weight. She has so reports having exertional dyspnea. -Physical therapy consulted  3.  Exertional dyspnea. -She reported having exertional dyspnea along with increasing bilateral extremity pitting edema and feels that this has contributed to her falls. - BNP checked by Dr Coralyn Pear- normal - ECHO pending - likely decondioned- check pulse ox on ambulation      4. Hypertension. -Patient's blood pressure is elevated with systolic blood pressures [id number] to 160s. -She is on amlodipine 10 mg by mouth daily, will add hydrochlorothiazide 25 mg by mouth daily to her regimen   DVT prophylaxis: Lovenox Code Status: Full code Family Communication: I spoke with her daughter was present at bedside Disposition Plan: Physical therapy consultation, and history discharged home with medically stable possibly in the next 24-48 hrs.   Consultants:   Physical therapy  Antimicrobials:  Ceftriaxone 1 g IV every 24 hours  Subjective: Much less pain in right leg today. No new complaints.   Objective: Filed Vitals:   01/28/16 0443 01/28/16 1401 01/28/16 2054 01/29/16 0509  BP: 154/69 115/42 138/53 129/60  Pulse: 86 79 93 87  Temp: 98 F (36.7 C) 98 F (36.7 C) 98.3 F (36.8 C) 98.3 F (36.8 C)  TempSrc: Oral Oral Oral Oral  Resp: 20 20 20 20   Height:      Weight:      SpO2: 99% 100% 99% 99%    Intake/Output Summary (Last 24 hours) at 01/29/16 1304 Last data filed at 01/29/16 0826  Gross per 24 hour  Intake    120 ml  Output      0 ml  Net    120 ml   Filed Weights   01/28/16 0100  Weight: 93.5 kg (206 lb 2.1 oz)    Examination:  General exam: No acute distress, nontoxic-appearing Respiratory system: Clear to auscultation. Respiratory effort normal. Cardiovascular system: S1 & S2 heard, RRR. No JVD, murmurs, rubs, gallops or clicks. No pedal edema. Gastrointestinal system: Abdomen is nondistended, soft and nontender. No organomegaly or masses felt.  Normal bowel sounds heard. Central nervous system: Alert and oriented. No focal neurological deficits. Extremities: Symmetric 5 x 5 power. Skin: There is an abrasion over the anterior shin of right lower extremity, surrounding erythema and swelling Psychiatry: Judgement and insight appear normal. Mood & affect appropriate.     Data Reviewed: I have personally reviewed following labs and  imaging studies  CBC:  Recent Labs Lab 01/27/16 2222 01/28/16 0453  WBC 11.3* 10.5  NEUTROABS 8.3*  --   HGB 10.3* 9.4*  HCT 31.6* 29.1*  MCV 81.2 81.1  PLT 314 [id number]   Basic Metabolic Panel:  Recent Labs Lab 01/27/16 2222 01/28/16 0453  NA 133* 139  K 3.9 4.1  CL 101 108  CO2 25 25  GLUCOSE 115* 118*  BUN 22* 18  CREATININE 1.00 0.72  CALCIUM 8.8* 8.6*   GFR: Estimated Creatinine Clearance: 64.7 mL/min (by C-G formula based on Cr of 0.72). Liver Function Tests:  Recent Labs Lab 01/27/16 2222  AST 20  ALT 16  ALKPHOS 85  BILITOT 0.6  PROT 7.2  ALBUMIN 3.3*   No results for input(s): LIPASE, AMYLASE in the last 168 hours. No results for input(s): AMMONIA in the last 168 hours. Coagulation Profile: No results for input(s): INR, PROTIME in the last 168 hours. Cardiac Enzymes: No results for input(s): CKTOTAL, CKMB, CKMBINDEX, TROPONINI in the last 168 hours. BNP (last 3 results) No results for input(s): PROBNP in the last 8760 hours. HbA1C: No results for input(s): HGBA1C in the last 72 hours. CBG: No results for input(s): GLUCAP in the last 168 hours. Lipid Profile: No results for input(s): CHOL, HDL, LDLCALC, TRIG, CHOLHDL, LDLDIRECT in the last 72 hours. Thyroid Function Tests: No results for input(s): TSH, T4TOTAL, FREET4, T3FREE, THYROIDAB in the last 72 hours. Anemia Panel: No results for input(s): VITAMINB12, FOLATE, FERRITIN, TIBC, IRON, RETICCTPCT in the last 72 hours. Sepsis Labs: No results for input(s): PROCALCITON, LATICACIDVEN in the last 168 hours.  No results found for this or any previous visit (from the past 240 hour(s)).       Radiology Studies: Dg Chest 2 View  01/27/2016  CLINICAL DATA:  Shortness of breath for 24 hours.  Fall 3 weeks ago. EXAM: CHEST  2 VIEW COMPARISON:  09/25/2015 FINDINGS: The patient is rotated to the left on today's radiograph, reducing diagnostic sensitivity and specificity. Atherosclerotic aortic arch.  Thoracic spondylosis. The lungs appear clear. Accounting for the rotation and projection, heart size is within normal limits. IMPRESSION: 1. Atherosclerosis. 2. Thoracic spondylosis. 3. No acute findings. Electronically Signed   By: Van Clines M.D.   On: 01/27/2016 23:02   Dg Tibia/fibula Right  01/27/2016  CLINICAL DATA:  Laceration on the right lower leg, concern for infection. EXAM: RIGHT TIBIA AND FIBULA - 2 VIEW COMPARISON:  09/22/2015 FINDINGS: There is diffuse mild subcutaneous edema along the lower leg. Severe osteoarthritis of the knee. Vascular calcifications noted. No gas tracking in the soft tissues. No findings of osteomyelitis of the tibia or fibula. I do not see a definite foreign body. Mild bony demineralization. Plantar and Achilles calcaneal spurs. IMPRESSION: 1. Mild diffuse subcutaneous edema in the calf. No foreign body or gas in the soft tissues identified. 2. Severe osteoarthritis of the knee. 3. Bony demineralization. Electronically Signed   By: Van Clines M.D.   On: 01/27/2016 23:03        Scheduled Meds: . amLODipine  10 mg Oral Daily  . aspirin EC  81 mg Oral  Daily  . brimonidine  1 drop Both Eyes BID  . cefTRIAXone (ROCEPHIN)  IV  1 g Intravenous Q24H  . cilostazol  100 mg Oral BID  . dorzolamide  1 drop Both Eyes BID  . enoxaparin (LOVENOX) injection  40 mg Subcutaneous Q24H  . hydrochlorothiazide  25 mg Oral Daily  . latanoprost  1 drop Both Eyes QHS  . timolol  1 drop Both Eyes BID   Continuous Infusions:    LOS: 1 day    Time spent:     [facility], MD [facility] Pager [phone]  If 7PM-7AM, please contact night-coverage [email or web address] Password [email or web address] 01/29/2016, 1:04 PM

## 2016-01-29 NOTE — Progress Notes (Signed)
Pt ambulated 100 feet in hall. O2 sats 100%, HR 103. Kizzie Ide, RN

## 2016-01-30 ENCOUNTER — Encounter (HOSPITAL_COMMUNITY): Payer: Self-pay | Admitting: Emergency Medicine

## 2016-01-30 ENCOUNTER — Inpatient Hospital Stay (HOSPITAL_COMMUNITY)
Admission: EM | Admit: 2016-01-30 | Discharge: 2016-02-02 | DRG: 381 | Disposition: A | Discharge status: Home / Self Care | Payer: Medicare Other | Attending: Internal Medicine | Admitting: Internal Medicine

## 2016-01-30 DIAGNOSIS — D62 Acute posthemorrhagic anemia: Secondary | ICD-10-CM

## 2016-01-30 DIAGNOSIS — I878 Other specified disorders of veins: Secondary | ICD-10-CM | POA: Diagnosis present

## 2016-01-30 DIAGNOSIS — Z96642 Presence of left artificial hip joint: Secondary | ICD-10-CM | POA: Diagnosis present

## 2016-01-30 DIAGNOSIS — K2211 Ulcer of esophagus with bleeding: Secondary | ICD-10-CM | POA: Diagnosis not present

## 2016-01-30 DIAGNOSIS — H409 Unspecified glaucoma: Secondary | ICD-10-CM | POA: Diagnosis present

## 2016-01-30 DIAGNOSIS — K210 Gastro-esophageal reflux disease with esophagitis: Secondary | ICD-10-CM | POA: Diagnosis present

## 2016-01-30 DIAGNOSIS — R269 Unspecified abnormalities of gait and mobility: Secondary | ICD-10-CM

## 2016-01-30 DIAGNOSIS — W19XXXA Unspecified fall, initial encounter: Secondary | ICD-10-CM

## 2016-01-30 DIAGNOSIS — R0609 Other forms of dyspnea: Secondary | ICD-10-CM | POA: Diagnosis present

## 2016-01-30 DIAGNOSIS — Z9181 History of falling: Secondary | ICD-10-CM

## 2016-01-30 DIAGNOSIS — E1151 Type 2 diabetes mellitus with diabetic peripheral angiopathy without gangrene: Secondary | ICD-10-CM | POA: Diagnosis present

## 2016-01-30 DIAGNOSIS — R296 Repeated falls: Secondary | ICD-10-CM

## 2016-01-30 DIAGNOSIS — R5381 Other malaise: Secondary | ICD-10-CM

## 2016-01-30 DIAGNOSIS — K922 Gastrointestinal hemorrhage, unspecified: Secondary | ICD-10-CM

## 2016-01-30 DIAGNOSIS — R12 Heartburn: Secondary | ICD-10-CM | POA: Diagnosis present

## 2016-01-30 DIAGNOSIS — K449 Diaphragmatic hernia without obstruction or gangrene: Secondary | ICD-10-CM | POA: Diagnosis present

## 2016-01-30 DIAGNOSIS — D649 Anemia, unspecified: Secondary | ICD-10-CM

## 2016-01-30 DIAGNOSIS — Z885 Allergy status to narcotic agent status: Secondary | ICD-10-CM

## 2016-01-30 DIAGNOSIS — I10 Essential (primary) hypertension: Secondary | ICD-10-CM

## 2016-01-30 DIAGNOSIS — K644 Residual hemorrhoidal skin tags: Secondary | ICD-10-CM | POA: Diagnosis present

## 2016-01-30 DIAGNOSIS — L97929 Non-pressure chronic ulcer of unspecified part of left lower leg with unspecified severity: Secondary | ICD-10-CM | POA: Diagnosis present

## 2016-01-30 DIAGNOSIS — L03115 Cellulitis of right lower limb: Secondary | ICD-10-CM | POA: Diagnosis present

## 2016-01-30 DIAGNOSIS — I5032 Chronic diastolic (congestive) heart failure: Secondary | ICD-10-CM | POA: Diagnosis not present

## 2016-01-30 DIAGNOSIS — I110 Hypertensive heart disease with heart failure: Secondary | ICD-10-CM | POA: Diagnosis present

## 2016-01-30 DIAGNOSIS — R600 Localized edema: Secondary | ICD-10-CM

## 2016-01-30 DIAGNOSIS — L97919 Non-pressure chronic ulcer of unspecified part of right lower leg with unspecified severity: Secondary | ICD-10-CM | POA: Diagnosis present

## 2016-01-30 DIAGNOSIS — K920 Hematemesis: Secondary | ICD-10-CM | POA: Diagnosis not present

## 2016-01-30 DIAGNOSIS — E860 Dehydration: Secondary | ICD-10-CM | POA: Diagnosis present

## 2016-01-30 DIAGNOSIS — E669 Obesity, unspecified: Secondary | ICD-10-CM | POA: Diagnosis present

## 2016-01-30 DIAGNOSIS — I739 Peripheral vascular disease, unspecified: Secondary | ICD-10-CM | POA: Diagnosis present

## 2016-01-30 DIAGNOSIS — Z6834 Body mass index (BMI) 34.0-34.9, adult: Secondary | ICD-10-CM

## 2016-01-30 DIAGNOSIS — E11622 Type 2 diabetes mellitus with other skin ulcer: Secondary | ICD-10-CM | POA: Diagnosis present

## 2016-01-30 DIAGNOSIS — Z7982 Long term (current) use of aspirin: Secondary | ICD-10-CM

## 2016-01-30 DIAGNOSIS — T461X5A Adverse effect of calcium-channel blockers, initial encounter: Secondary | ICD-10-CM | POA: Diagnosis present

## 2016-01-30 DIAGNOSIS — Z79899 Other long term (current) drug therapy: Secondary | ICD-10-CM

## 2016-01-30 DIAGNOSIS — K222 Esophageal obstruction: Secondary | ICD-10-CM | POA: Diagnosis present

## 2016-01-30 DIAGNOSIS — T39395A Adverse effect of other nonsteroidal anti-inflammatory drugs [NSAID], initial encounter: Secondary | ICD-10-CM | POA: Diagnosis present

## 2016-01-30 DIAGNOSIS — M549 Dorsalgia, unspecified: Secondary | ICD-10-CM | POA: Diagnosis present

## 2016-01-30 DIAGNOSIS — I83029 Varicose veins of left lower extremity with ulcer of unspecified site: Secondary | ICD-10-CM

## 2016-01-30 DIAGNOSIS — I83009 Varicose veins of unspecified lower extremity with ulcer of unspecified site: Secondary | ICD-10-CM | POA: Diagnosis present

## 2016-01-30 DIAGNOSIS — I83019 Varicose veins of right lower extremity with ulcer of unspecified site: Secondary | ICD-10-CM

## 2016-01-30 DIAGNOSIS — I5030 Unspecified diastolic (congestive) heart failure: Secondary | ICD-10-CM

## 2016-01-30 LAB — COMPREHENSIVE METABOLIC PANEL
ALBUMIN: 3.1 g/dL — AB (ref 3.5–5.0)
ALT: 15 U/L (ref 14–54)
AST: 18 U/L (ref 15–41)
Alkaline Phosphatase: 72 U/L (ref 38–126)
Anion gap: 9 (ref 5–15)
BILIRUBIN TOTAL: 0.4 mg/dL (ref 0.3–1.2)
BUN: 33 mg/dL — AB (ref 6–20)
CALCIUM: 9 mg/dL (ref 8.9–10.3)
CO2: 25 mmol/L (ref 22–32)
CREATININE: 0.99 mg/dL (ref 0.44–1.00)
Chloride: 99 mmol/L — ABNORMAL LOW (ref 101–111)
GFR, EST NON AFRICAN AMERICAN: 54 mL/min — AB (ref >60)
Glucose, Bld: 142 mg/dL — ABNORMAL HIGH (ref 65–99)
Potassium: 4.3 mmol/L (ref 3.5–5.1)
Sodium: 133 mmol/L — ABNORMAL LOW (ref 135–145)
Total Protein: 6.8 g/dL (ref 6.5–8.1)

## 2016-01-30 LAB — CBC
HEMATOCRIT: 26.9 % — AB (ref 36.0–46.0)
Hemoglobin: 8.8 g/dL — ABNORMAL LOW (ref 12.0–15.0)
MCH: 25.9 pg — ABNORMAL LOW (ref 26.0–34.0)
MCHC: 32.7 g/dL (ref 30.0–36.0)
MCV: 79.1 fL (ref 78.0–100.0)
PLATELETS: 317 10*3/uL (ref 150–400)
RBC: 3.4 MIL/uL — ABNORMAL LOW (ref 3.87–5.11)
RDW: 15.8 % — AB (ref 11.5–15.5)
WBC: 11.2 10*3/uL — ABNORMAL HIGH (ref 4.0–10.5)

## 2016-01-30 LAB — POC OCCULT BLOOD, ED: Fecal Occult Bld: POSITIVE — AB

## 2016-01-30 MED ORDER — SODIUM CHLORIDE 0.9 % IV SOLN
8.0000 mg/h | INTRAVENOUS | Status: DC
  Administered 2016-01-30 – 2016-02-02 (×5): 8 mg/h via INTRAVENOUS
  Filled 2016-01-30 (×10): qty 80

## 2016-01-30 MED ORDER — T.E.D. BELOW KNEE/XL MISC: 1.0000 | Freq: Every day | Status: DC

## 2016-01-30 MED ORDER — PANTOPRAZOLE SODIUM 40 MG IV SOLR: 40.0000 mg | Freq: Two times a day (BID) | INTRAVENOUS | Status: DC

## 2016-01-30 MED ORDER — SODIUM CHLORIDE 0.9 % IV SOLN
80.0000 mg | Freq: Once | INTRAVENOUS | Status: AC
  Administered 2016-01-30: 80 mg via INTRAVENOUS
  Filled 2016-01-30: qty 80

## 2016-01-30 MED ORDER — CEFUROXIME AXETIL 500 MG PO TABS: 500.0000 mg | ORAL_TABLET | Freq: Two times a day (BID) | ORAL | Status: DC

## 2016-01-30 MED ORDER — HYDROCHLOROTHIAZIDE 25 MG PO TABS: 25.0000 mg | ORAL_TABLET | Freq: Every day | ORAL | Status: DC

## 2016-01-30 NOTE — Progress Notes (Signed)
Physical Therapy Treatment Patient Details Name: Brenda Kerr MRN: [id number] DOB: 10-Oct-1940 Today's Date: 01/30/2016    History of Present Illness Brenda Kerr is a 75 year old female with a past medical history of hypertension diabetes mellitus, admitted this morning for right lower extremity cellulitis. She reports having recurrent falls over the last several months, having her last fall about 2-1/2 weeks ago. She stated injuring her right shin and noticed worsening erythema and pain to the area , found to have cellulitis RLE    PT Comments    Assisted pt OOB to amb to bathroom then in hallway limited distance using RW.    Follow Up Recommendations  Home health PT     Equipment Recommendations  Rolling walker with 5" wheels (youth)    Recommendations for Other Services       Precautions / Restrictions Precautions Precautions: Fall Restrictions Weight Bearing Restrictions: No    Mobility  Bed Mobility Overal bed mobility: Needs Assistance Bed Mobility: Supine to Sit     Supine to sit: Supervision     General bed mobility comments: increased time  Transfers Overall transfer level: Needs assistance Equipment used: Rolling walker (2 wheeled) Transfers: Sit to/from [employer] Transfers Sit to Stand: Supervision;Min guard Stand pivot transfers: Supervision;Min guard       General transfer comment: good use of hands to steady self  Ambulation/Gait Ambulation/Gait assistance: Supervision;Min guard Ambulation Distance (Feet): 75 Feet Assistive device: Rolling walker (2 wheeled) Gait Pattern/deviations: Step-through pattern;Decreased stride length;Trunk flexed Gait velocity: decreased   General Gait Details: poor forward flex posture with tendency to push walker out too far front.  Limited amb distance due to fatigue and mild dyspnea.    Stairs            Wheelchair Mobility    Modified Rankin (Stroke Patients Only)       Balance                                    Cognition Arousal/Alertness: Awake/alert Behavior During Therapy: WFL for tasks assessed/performed Overall Cognitive Status: Within Functional Limits for tasks assessed                      Exercises      General Comments        Pertinent Vitals/Pain Pain Assessment: 0-10 Pain Score: 4  Pain Location: R LE > L LE Pain Descriptors / Indicators: Sore;Aching;Constant Pain Intervention(s): Monitored during session;Repositioned    Home Living                      Prior Function            PT Goals (current goals can now be found in the care plan section) Progress towards PT goals: Progressing toward goals    Frequency  Min 3X/week    PT Plan Current plan remains appropriate    Co-evaluation             End of Session Equipment Utilized During Treatment: Gait belt Activity Tolerance: Patient tolerated treatment well;Patient limited by fatigue Patient left: with call bell/phone within reach;with bed alarm set;in chair     Time: [id number] PT Time Calculation (min) (ACUTE ONLY): 28 min  Charges:  $Gait Training: 8-22 mins $Therapeutic Activity: 8-22 mins  G Codes:      Rica Koyanagi  PTA [facility]  Acute  Rehab Pager      [phone]

## 2016-01-30 NOTE — Progress Notes (Signed)
Pt VSS. IV removed. AVS reviewed. Youth rolling walker delivered to room. No further questions. Kizzie Ide, RN

## 2016-01-30 NOTE — Progress Notes (Signed)
Date:  January 30, 2016 Chart reviewed for concurrent status and case management needs. Will continue to follow the patient for changes and needs:  [facility] for [facility]/ has needed dme Expected discharge date: [id number] Velva Harman, [place], [place], [place]

## 2016-01-30 NOTE — Progress Notes (Signed)
[id number] Davis,BSN,RN3,CCM: tct-jermaine with [facility] message for need of youth rolling walker.

## 2016-01-30 NOTE — Discharge Summary (Addendum)
Physician Discharge Summary  Brenda Kerr [id number] DOB: October 19, 1940 DOA: 01/27/2016  PCP: Elyn Peers, MD  Admit date: 01/27/2016 Discharge date: 01/30/2016  Time spent: 60 minutes    Discharge Condition: stable    Discharge Diagnoses:  Principal Problem:   Cellulitis of right anterior lower leg Active Problems:   HTN (hypertension)   Venous stasis   Physical deconditioning   History of present illness:  Bren[place] 75[place]year[place] with a past medical history of hypertension diabetes mellitus, admitted this morning for right lower extremity cellulitis. She reports having recurrent falls over the last several months, having her last fall about 2-1/2 weeks ago. She stated injuring her right shin and noticed worsening erythema and pain to the area several days later. Symptoms have progressively worsened for which she presented to the emergency department overnight. She was found to have cellulitis and started on antibiotic therapy.  Hospital Course:  Right lower extremity cellulitis -Brenda Kerr reporting having a fall about 2 and half weeks ago that resulted in an injury to her right shin. -Since then she has noted increasing erythema, pain, swelling involving her right lower extremity. -Symptoms appear consistent with cellulitis-  Has improved with Ceftriaxone -  ceftriaxonechanged to Ceftin on d/c - she's noted significant improvement in pain and swelling  Chronic venous stasis - TEDS- elevate as much as possible- she admits that she has been inactive lately and has been sitting in a chair with feet in dependent position  Recurrent falls -She reports having recurrent falls over the past several months, most recently resulting in injury to her right leg complicated by infection. -X-ray of right tibia/fibula show diffuse subcutaneous edema without evidence of fracture -She feels that increase in edema involving lower extremities have contributed to her falls and  feels that she has gained weight. She has so reports having exertional dyspnea. -Physical therapy consulted   Exertional dyspnea. -She reported having exertional dyspnea along with increasing bilateral extremity pitting edema and feels that this has contributed to her falls. - BNP checked by Dr Coralyn Pear- normal - ECHO does not reveal and abormalities - likely decondioned- pulse ox on ambulation is 100%- discussed to increase activity which she plans to do   Hypertension. -Patient's blood pressure is elevated with systolic blood pressures Q000111Q to 160s. -She is on amlodipine 10 mg by mouth daily- as it may be contributing to pedal edema, will d/c - Dr Coralyn Pear added hydrochlorothiazide 25 mg by mouth daily    Procedures:  noen  Consultations:  none  Discharge Exam: Filed Weights   01/28/16 0100  Weight: 93.5 kg (206 lb 2.1 oz)   Filed Vitals:   01/29/16 2117 01/30/16 0555  BP: 142/67 118/52  Pulse: 93 87  Temp: 99.2 F (37.3 C) 98.4 F (36.9 C)  Resp: 18 18    General: AAO x 3, no distress Cardiovascular: RRR, no murmurs  Respiratory: clear to auscultation bilaterally GI: soft, non-tender, non-distended, bowel sound positive  Discharge Instructions You were cared for by a hospitalist during your hospital stay. If you have any questions about your discharge medications or the care you received while you were in the hospital after you are discharged, you can call the unit and asked to speak with the hospitalist on call if the hospitalist that took care of you is not available. Once you are discharged, your primary care physician will handle any further medical issues. Please note that NO REFILLS for any discharge medications will be authorized  once you are discharged, as it is imperative that you return to your primary care physician (or establish a relationship with a primary care physician if you do not have one) for your aftercare needs so that they can reassess your need for  medications and monitor your lab values.      Discharge Instructions    Diet - low sodium heart healthy    Complete by:  As directed      Discharge instructions    Complete by:  As directed   See your family doctor in 1 wk Keep feet elevated when sitting     Increase activity slowly    Complete by:  As directed             Medication List    STOP taking these medications        amLODipine 10 MG tablet  Commonly known as:  NORVASC      TAKE these medications        acetaminophen 500 MG tablet  Commonly known as:  TYLENOL  Take 500 mg by mouth every 6 (six) hours as needed for mild pain.     aspirin EC 81 MG tablet  Take 81 mg by mouth daily.     bimatoprost 0.01 % Soln  Commonly known as:  LUMIGAN  Place 1 drop into both eyes at bedtime.     brimonidine-timolol 0.2-0.5 % ophthalmic solution  Commonly known as:  COMBIGAN  Place 1 drop into both eyes every 12 (twelve) hours.     cefUROXime 500 MG tablet  Commonly known as:  CEFTIN  Take 1 tablet (500 mg total) by mouth 2 (two) times daily with a meal.     cilostazol 100 MG tablet  Commonly known as:  PLETAL  Take 100 mg by mouth 2 (two) times daily.     dorzolamide 2 % ophthalmic solution  Commonly known as:  TRUSOPT  Place 1 drop into both eyes 2 (two) times daily.     hydrochlorothiazide 25 MG tablet  Commonly known as:  HYDRODIURIL  Take 1 tablet (25 mg total) by mouth daily.     T.E.D. BELOW KNEE/XL Misc  1 each by Does not apply route daily.       Allergies  Allergen Reactions  . Codeine Nausea And Vomiting      The results of significant diagnostics from this hospitalization (including imaging, microbiology, ancillary and laboratory) are listed below for reference.    Significant Diagnostic Studies: Dg Chest 2 View  01/27/2016  CLINICAL DATA:  Shortness of breath for 24 hours.  Fall 3 weeks ago. EXAM: CHEST  2 VIEW COMPARISON:  09/25/2015 FINDINGS: The patient is rotated to the left on  today's radiograph, reducing diagnostic sensitivity and specificity. Atherosclerotic aortic arch. Thoracic spondylosis. The lungs appear clear. Accounting for the rotation and projection, heart size is within normal limits. IMPRESSION: 1. Atherosclerosis. 2. Thoracic spondylosis. 3. No acute findings. Electronically Signed   By: Van Clines M.D.   On: 01/27/2016 23:02   Dg Tibia/fibula Right  01/27/2016  CLINICAL DATA:  Laceration on the right lower leg, concern for infection. EXAM: RIGHT TIBIA AND FIBULA - 2 VIEW COMPARISON:  09/22/2015 FINDINGS: There is diffuse mild subcutaneous edema along the lower leg. Severe osteoarthritis of the knee. Vascular calcifications noted. No gas tracking in the soft tissues. No findings of osteomyelitis of the tibia or fibula. I do not see a definite foreign body. Mild bony demineralization. Plantar and Achilles calcaneal  spurs. IMPRESSION: 1. Mild diffuse subcutaneous edema in the calf. No foreign body or gas in the soft tissues identified. 2. Severe osteoarthritis of the knee. 3. Bony demineralization. Electronically Signed   By: Van Clines M.D.   On: 01/27/2016 23:03    Microbiology: No results found for this or any previous visit (from the past 240 hour(s)).   Labs: Basic Metabolic Panel:  Recent Labs Lab 01/27/16 2222 01/28/16 0453  NA 133* 139  K 3.9 4.1  CL 101 108  CO2 25 25  GLUCOSE 115* 118*  BUN 22* 18  CREATININE 1.00 0.72  CALCIUM 8.8* 8.6*   Liver Function Tests:  Recent Labs Lab 01/27/16 2222  AST 20  ALT 16  ALKPHOS 85  BILITOT 0.6  PROT 7.2  ALBUMIN 3.3*   No results for input(s): LIPASE, AMYLASE in the last 168 hours. No results for input(s): AMMONIA in the last 168 hours. CBC:  Recent Labs Lab 01/27/16 2222 01/28/16 0453  WBC 11.3* 10.5  NEUTROABS 8.3*  --   HGB 10.3* 9.4*  HCT 31.6* 29.1*  MCV 81.2 81.1  PLT 314 270   Cardiac Enzymes: No results for input(s): CKTOTAL, CKMB, CKMBINDEX, TROPONINI  in the last 168 hours. BNP: BNP (last 3 results)  Recent Labs  09/25/15 1837 01/27/16 2222 01/28/16 0453  BNP 34.2 38.3 25.6    ProBNP (last 3 results) No results for input(s): PROBNP in the last 8760 hours.  CBG: No results for input(s): GLUCAP in the last 168 hours.     SignedDebbe Odea, MD [facility] 01/30/2016, 2:00 PM

## 2016-01-30 NOTE — ED Notes (Signed)
Patient presents for hematemesis x1 episode today. Patient was discharged from inpatient today for fall. Daughter states emesis appeared to have stool in it also. Patient c/o generalized abdominal pain starting earlier today. Patient c/o nausea and dizziness before being discharged.

## 2016-01-31 ENCOUNTER — Inpatient Hospital Stay (HOSPITAL_COMMUNITY): Payer: Medicare Other | Admitting: Anesthesiology

## 2016-01-31 ENCOUNTER — Encounter (HOSPITAL_COMMUNITY): Payer: Self-pay | Admitting: Anesthesiology

## 2016-01-31 ENCOUNTER — Encounter (HOSPITAL_COMMUNITY)
Admission: EM | Disposition: A | Discharge status: Home / Self Care | Payer: Self-pay | Source: Home / Self Care | Attending: Internal Medicine

## 2016-01-31 DIAGNOSIS — R269 Unspecified abnormalities of gait and mobility: Secondary | ICD-10-CM

## 2016-01-31 DIAGNOSIS — I5032 Chronic diastolic (congestive) heart failure: Secondary | ICD-10-CM

## 2016-01-31 DIAGNOSIS — K210 Gastro-esophageal reflux disease with esophagitis: Secondary | ICD-10-CM | POA: Diagnosis present

## 2016-01-31 DIAGNOSIS — E860 Dehydration: Secondary | ICD-10-CM | POA: Diagnosis present

## 2016-01-31 DIAGNOSIS — I5030 Unspecified diastolic (congestive) heart failure: Secondary | ICD-10-CM | POA: Diagnosis present

## 2016-01-31 DIAGNOSIS — I10 Essential (primary) hypertension: Secondary | ICD-10-CM

## 2016-01-31 DIAGNOSIS — D649 Anemia, unspecified: Secondary | ICD-10-CM

## 2016-01-31 DIAGNOSIS — I878 Other specified disorders of veins: Secondary | ICD-10-CM | POA: Diagnosis present

## 2016-01-31 DIAGNOSIS — Z96642 Presence of left artificial hip joint: Secondary | ICD-10-CM | POA: Diagnosis present

## 2016-01-31 DIAGNOSIS — R0609 Other forms of dyspnea: Secondary | ICD-10-CM | POA: Diagnosis not present

## 2016-01-31 DIAGNOSIS — Z885 Allergy status to narcotic agent status: Secondary | ICD-10-CM | POA: Diagnosis not present

## 2016-01-31 DIAGNOSIS — K644 Residual hemorrhoidal skin tags: Secondary | ICD-10-CM | POA: Diagnosis present

## 2016-01-31 DIAGNOSIS — K2210 Ulcer of esophagus without bleeding: Secondary | ICD-10-CM | POA: Diagnosis not present

## 2016-01-31 DIAGNOSIS — L97929 Non-pressure chronic ulcer of unspecified part of left lower leg with unspecified severity: Secondary | ICD-10-CM | POA: Diagnosis present

## 2016-01-31 DIAGNOSIS — T461X5A Adverse effect of calcium-channel blockers, initial encounter: Secondary | ICD-10-CM | POA: Diagnosis present

## 2016-01-31 DIAGNOSIS — D62 Acute posthemorrhagic anemia: Secondary | ICD-10-CM | POA: Diagnosis not present

## 2016-01-31 DIAGNOSIS — I749 Embolism and thrombosis of unspecified artery: Secondary | ICD-10-CM | POA: Diagnosis not present

## 2016-01-31 DIAGNOSIS — M549 Dorsalgia, unspecified: Secondary | ICD-10-CM | POA: Diagnosis present

## 2016-01-31 DIAGNOSIS — L97919 Non-pressure chronic ulcer of unspecified part of right lower leg with unspecified severity: Secondary | ICD-10-CM | POA: Diagnosis present

## 2016-01-31 DIAGNOSIS — E11622 Type 2 diabetes mellitus with other skin ulcer: Secondary | ICD-10-CM | POA: Diagnosis present

## 2016-01-31 DIAGNOSIS — K2211 Ulcer of esophagus with bleeding: Secondary | ICD-10-CM | POA: Diagnosis not present

## 2016-01-31 DIAGNOSIS — W19XXXA Unspecified fall, initial encounter: Secondary | ICD-10-CM

## 2016-01-31 DIAGNOSIS — Z6834 Body mass index (BMI) 34.0-34.9, adult: Secondary | ICD-10-CM | POA: Diagnosis not present

## 2016-01-31 DIAGNOSIS — K922 Gastrointestinal hemorrhage, unspecified: Secondary | ICD-10-CM | POA: Diagnosis not present

## 2016-01-31 DIAGNOSIS — R5381 Other malaise: Secondary | ICD-10-CM | POA: Diagnosis not present

## 2016-01-31 DIAGNOSIS — T39395A Adverse effect of other nonsteroidal anti-inflammatory drugs [NSAID], initial encounter: Secondary | ICD-10-CM | POA: Diagnosis present

## 2016-01-31 DIAGNOSIS — I110 Hypertensive heart disease with heart failure: Secondary | ICD-10-CM | POA: Diagnosis present

## 2016-01-31 DIAGNOSIS — Z7982 Long term (current) use of aspirin: Secondary | ICD-10-CM | POA: Diagnosis not present

## 2016-01-31 DIAGNOSIS — R296 Repeated falls: Secondary | ICD-10-CM | POA: Diagnosis present

## 2016-01-31 DIAGNOSIS — E669 Obesity, unspecified: Secondary | ICD-10-CM | POA: Diagnosis present

## 2016-01-31 DIAGNOSIS — L03115 Cellulitis of right lower limb: Secondary | ICD-10-CM | POA: Diagnosis not present

## 2016-01-31 DIAGNOSIS — H409 Unspecified glaucoma: Secondary | ICD-10-CM | POA: Diagnosis present

## 2016-01-31 DIAGNOSIS — Z9181 History of falling: Secondary | ICD-10-CM | POA: Diagnosis not present

## 2016-01-31 DIAGNOSIS — R12 Heartburn: Secondary | ICD-10-CM | POA: Diagnosis present

## 2016-01-31 DIAGNOSIS — E1151 Type 2 diabetes mellitus with diabetic peripheral angiopathy without gangrene: Secondary | ICD-10-CM | POA: Diagnosis present

## 2016-01-31 DIAGNOSIS — I739 Peripheral vascular disease, unspecified: Secondary | ICD-10-CM | POA: Diagnosis present

## 2016-01-31 DIAGNOSIS — K209 Esophagitis, unspecified: Secondary | ICD-10-CM | POA: Diagnosis not present

## 2016-01-31 DIAGNOSIS — R600 Localized edema: Secondary | ICD-10-CM | POA: Diagnosis present

## 2016-01-31 DIAGNOSIS — K449 Diaphragmatic hernia without obstruction or gangrene: Secondary | ICD-10-CM | POA: Diagnosis not present

## 2016-01-31 DIAGNOSIS — K920 Hematemesis: Secondary | ICD-10-CM | POA: Diagnosis not present

## 2016-01-31 DIAGNOSIS — Z79899 Other long term (current) drug therapy: Secondary | ICD-10-CM | POA: Diagnosis not present

## 2016-01-31 DIAGNOSIS — L920 Granuloma annulare: Secondary | ICD-10-CM | POA: Diagnosis not present

## 2016-01-31 DIAGNOSIS — I83009 Varicose veins of unspecified lower extremity with ulcer of unspecified site: Secondary | ICD-10-CM | POA: Diagnosis present

## 2016-01-31 DIAGNOSIS — K222 Esophageal obstruction: Secondary | ICD-10-CM | POA: Diagnosis not present

## 2016-01-31 HISTORY — PX: ESOPHAGOGASTRODUODENOSCOPY: SHX5428

## 2016-01-31 LAB — CBC
HCT: 22.5 % — ABNORMAL LOW (ref 36.0–46.0)
HCT: 25 % — ABNORMAL LOW (ref 36.0–46.0)
HEMOGLOBIN: 8.3 g/dL — AB (ref 12.0–15.0)
Hemoglobin: 7.4 g/dL — ABNORMAL LOW (ref 12.0–15.0)
MCH: 26.5 pg (ref 26.0–34.0)
MCH: 27.1 pg (ref 26.0–34.0)
MCHC: 32.9 g/dL (ref 30.0–36.0)
MCHC: 33.2 g/dL (ref 30.0–36.0)
MCV: 80.6 fL (ref 78.0–100.0)
MCV: 81.7 fL (ref 78.0–100.0)
PLATELETS: 279 10*3/uL (ref 150–400)
PLATELETS: 276 10*3/uL (ref 150–400)
RBC: 2.79 MIL/uL — AB (ref 3.87–5.11)
RBC: 3.06 MIL/uL — ABNORMAL LOW (ref 3.87–5.11)
RDW: 16.1 % — AB (ref 11.5–15.5)
RDW: 16 % — AB (ref 11.5–15.5)
WBC: 10.3 10*3/uL (ref 4.0–10.5)
WBC: 10.3 10*3/uL (ref 4.0–10.5)

## 2016-01-31 LAB — URINE MICROSCOPIC-ADD ON

## 2016-01-31 LAB — BASIC METABOLIC PANEL
Anion gap: 8 (ref 5–15)
BUN: 30 mg/dL — AB (ref 6–20)
CALCIUM: 8.6 mg/dL — AB (ref 8.9–10.3)
CHLORIDE: 100 mmol/L — AB (ref 101–111)
CO2: 26 mmol/L (ref 22–32)
CREATININE: 0.91 mg/dL (ref 0.44–1.00)
GFR calc Af Amer: >60 mL/min (ref >60)
GFR calc non Af Amer: >60 mL/min (ref >60)
GLUCOSE: 111 mg/dL — AB (ref 65–99)
Potassium: 3.7 mmol/L (ref 3.5–5.1)
Sodium: 134 mmol/L — ABNORMAL LOW (ref 135–145)

## 2016-01-31 LAB — ABO/RH: ABO/RH(D): O POS

## 2016-01-31 LAB — URINALYSIS, ROUTINE W REFLEX MICROSCOPIC
BILIRUBIN URINE: NEGATIVE
Glucose, UA: NEGATIVE mg/dL
KETONES UR: NEGATIVE mg/dL
Leukocytes, UA: NEGATIVE
NITRITE: NEGATIVE
PH: 6 (ref 5.0–8.0)
Protein, ur: NEGATIVE mg/dL
SPECIFIC GRAVITY, URINE: 1.008 (ref 1.005–1.030)

## 2016-01-31 LAB — GLUCOSE, CAPILLARY
GLUCOSE-CAPILLARY: 120 mg/dL — AB (ref 65–99)
Glucose-Capillary: 135 mg/dL — ABNORMAL HIGH (ref 65–99)

## 2016-01-31 LAB — PREPARE RBC (CROSSMATCH)

## 2016-01-31 SURGERY — EGD (ESOPHAGOGASTRODUODENOSCOPY)
Anesthesia: Monitor Anesthesia Care | Laterality: Left

## 2016-01-31 MED ORDER — SODIUM CHLORIDE 0.9 % IV SOLN: 250.0000 mL | INTRAVENOUS | Status: DC | PRN

## 2016-01-31 MED ORDER — SODIUM CHLORIDE 0.9% FLUSH: 3.0000 mL | Freq: Two times a day (BID) | INTRAVENOUS | Status: DC

## 2016-01-31 MED ORDER — HYDROCODONE-ACETAMINOPHEN 5-325 MG PO TABS
1.0000 | ORAL_TABLET | ORAL | Status: DC | PRN
  Administered 2016-01-31 – 2016-02-01 (×3): 1 via ORAL
  Filled 2016-01-31 (×3): qty 1

## 2016-01-31 MED ORDER — BRIMONIDINE TARTRATE 0.2 % OP SOLN
1.0000 [drp] | Freq: Two times a day (BID) | OPHTHALMIC | Status: DC
  Administered 2016-01-31 – 2016-02-01 (×4): 1 [drp] via OPHTHALMIC
  Filled 2016-01-31: qty 5

## 2016-01-31 MED ORDER — CEFUROXIME AXETIL 500 MG PO TABS
500.0000 mg | ORAL_TABLET | Freq: Two times a day (BID) | ORAL | Status: DC
  Administered 2016-01-31 – 2016-02-02 (×4): 500 mg via ORAL
  Filled 2016-01-31 (×7): qty 1

## 2016-01-31 MED ORDER — PHENYLEPHRINE HCL 10 MG/ML IJ SOLN
INTRAMUSCULAR | Status: DC | PRN
  Administered 2016-01-31: 40 ug via INTRAVENOUS

## 2016-01-31 MED ORDER — DORZOLAMIDE HCL 2 % OP SOLN
1.0000 [drp] | Freq: Two times a day (BID) | OPHTHALMIC | Status: DC
  Administered 2016-01-31 – 2016-02-01 (×4): 1 [drp] via OPHTHALMIC
  Filled 2016-01-31: qty 10

## 2016-01-31 MED ORDER — PROPOFOL 10 MG/ML IV BOLUS
INTRAVENOUS | Status: DC | PRN
  Administered 2016-01-31: 30 mg via INTRAVENOUS

## 2016-01-31 MED ORDER — POLYETHYLENE GLYCOL 3350 17 G PO PACK: 17.0000 g | PACK | Freq: Every day | ORAL | Status: DC | PRN

## 2016-01-31 MED ORDER — ONDANSETRON HCL 4 MG/2ML IJ SOLN
4.0000 mg | Freq: Four times a day (QID) | INTRAMUSCULAR | Status: DC | PRN
  Administered 2016-01-31: 4 mg via INTRAVENOUS
  Filled 2016-01-31: qty 2

## 2016-01-31 MED ORDER — CILOSTAZOL 100 MG PO TABS
100.0000 mg | ORAL_TABLET | Freq: Two times a day (BID) | ORAL | Status: DC
  Filled 2016-01-31 (×2): qty 1

## 2016-01-31 MED ORDER — LIDOCAINE HCL (CARDIAC) 20 MG/ML IV SOLN
INTRAVENOUS | Status: AC
  Filled 2016-01-31: qty 5

## 2016-01-31 MED ORDER — PROPOFOL 500 MG/50ML IV EMUL
INTRAVENOUS | Status: DC | PRN
  Administered 2016-01-31: 100 ug/kg/min via INTRAVENOUS

## 2016-01-31 MED ORDER — SODIUM CHLORIDE 0.9% FLUSH: 3.0000 mL | INTRAVENOUS | Status: DC | PRN

## 2016-01-31 MED ORDER — BRIMONIDINE TARTRATE-TIMOLOL 0.2-0.5 % OP SOLN: 1.0000 [drp] | Freq: Two times a day (BID) | OPHTHALMIC | Status: DC

## 2016-01-31 MED ORDER — PHENYLEPHRINE 40 MCG/ML (10ML) SYRINGE FOR IV PUSH (FOR BLOOD PRESSURE SUPPORT)
PREFILLED_SYRINGE | INTRAVENOUS | Status: AC
  Filled 2016-01-31: qty 10

## 2016-01-31 MED ORDER — TIMOLOL MALEATE 0.5 % OP SOLN
1.0000 [drp] | Freq: Two times a day (BID) | OPHTHALMIC | Status: DC
  Administered 2016-01-31 – 2016-02-01 (×4): 1 [drp] via OPHTHALMIC
  Filled 2016-01-31: qty 5

## 2016-01-31 MED ORDER — PROPOFOL 10 MG/ML IV BOLUS
INTRAVENOUS | Status: AC
  Filled 2016-01-31: qty 60

## 2016-01-31 MED ORDER — FUROSEMIDE 20 MG PO TABS
20.0000 mg | ORAL_TABLET | Freq: Two times a day (BID) | ORAL | Status: DC
  Administered 2016-01-31: 20 mg via ORAL
  Filled 2016-01-31 (×3): qty 1

## 2016-01-31 MED ORDER — ASPIRIN EC 81 MG PO TBEC
81.0000 mg | DELAYED_RELEASE_TABLET | Freq: Every day | ORAL | Status: DC
  Filled 2016-01-31: qty 1

## 2016-01-31 MED ORDER — LACTATED RINGERS IV SOLN
INTRAVENOUS | Status: DC | PRN
  Administered 2016-01-31: 13:00:00 via INTRAVENOUS

## 2016-01-31 MED ORDER — LATANOPROST 0.005 % OP SOLN
1.0000 [drp] | Freq: Every day | OPHTHALMIC | Status: DC
  Administered 2016-01-31 – 2016-02-01 (×2): 1 [drp] via OPHTHALMIC
  Filled 2016-01-31 (×2): qty 2.5

## 2016-01-31 MED ORDER — LIDOCAINE HCL (CARDIAC) 20 MG/ML IV SOLN
INTRAVENOUS | Status: DC | PRN
  Administered 2016-01-31: 25 mg via INTRATRACHEAL

## 2016-01-31 MED ORDER — ONDANSETRON HCL 4 MG PO TABS: 4.0000 mg | ORAL_TABLET | Freq: Four times a day (QID) | ORAL | Status: DC | PRN

## 2016-01-31 MED ORDER — SODIUM CHLORIDE 0.9 % IV SOLN
Freq: Once | INTRAVENOUS | Status: AC
  Administered 2016-01-31: 15:00:00 via INTRAVENOUS

## 2016-01-31 MED ORDER — AMLODIPINE BESYLATE 10 MG PO TABS
10.0000 mg | ORAL_TABLET | Freq: Every day | ORAL | Status: DC
  Administered 2016-02-01: 10 mg via ORAL
  Filled 2016-01-31 (×3): qty 1

## 2016-01-31 NOTE — ED Provider Notes (Signed)
CSN: [id number]     Arrival date & time 01/30/16  1925 History   First MD Initiated Contact with Patient 01/30/16 2139     Chief Complaint  Patient presents with  . Hematemesis     (Consider location/radiation/quality/duration/timing/severity/associated sxs/prior Treatment) HPI 75 year old female who presents with hematemesis. She has a history of hypertension, GERD, and diabetes. Does not take blood thinners. She was just discharged from the hospital this morning after treatment for cellulitis. This evening around 6 PM she had large volume hematemesis with coffee ground emesis. No prior history of GI bleed. Denies melena, hematochezia, abdominal pain, chest pain, shortness of breath, syncope or near syncope. Has been taking naproxen routinely over the past few weeks for arthritis. Received Lovenox during the hospitalist. No prior history of GI bleed and no prior colonoscopy/endoscopy. Past Medical History  Diagnosis Date  . Hypertension   . Glaucoma   . Dysrhythmia   . Shortness of breath     walk a long way  . Peripheral vascular disease ([place])   . GERD (gastroesophageal reflux disease)   . Arthritis   . H/O hiatal hernia   . Diabetes mellitus without complication [facility])    Past Surgical History  Procedure Laterality Date  . Eye surgery    . Joint replacement      hip replacement  . Arthroplasty  03/09/2012    lt hip  . Total hip arthroplasty  03/09/2012    Procedure: TOTAL HIP ARTHROPLASTY;  Surgeon: Sharmon Revere, MD;  Location: [place];  Service: Orthopedics;  Laterality: Left;  . Left heart catheterization with coronary angiogram N/A 05/29/2014    Procedure: LEFT HEART CATHETERIZATION WITH CORONARY ANGIOGRAM;  Surgeon: Laverda Page, MD;  Location: [facility] CATH LAB;  Service: Cardiovascular;  Laterality: N/A;   No family history on file. Social History  Substance Use Topics  . Smoking status: Never Smoker   . Smokeless tobacco: Never Used  . Alcohol Use: No   OB History    No data available     Review of Systems 10/14 systems reviewed and are negative other than those stated in the HPI    Allergies  Codeine  Home Medications   Prior to Admission medications   Medication Sig Start Date End Date Taking? Authorizing Provider  amLODipine (NORVASC) 10 MG tablet Take 10 mg by mouth daily.  12/21/15  Yes Historical Provider, MD  aspirin EC 81 MG tablet Take 81 mg by mouth daily.   Yes Historical Provider, MD  bimatoprost (LUMIGAN) 0.01 % SOLN Place 1 drop into both eyes at bedtime.     Yes Historical Provider, MD  brimonidine-timolol (COMBIGAN) 0.2-0.5 % ophthalmic solution Place 1 drop into both eyes every 12 (twelve) hours.     Yes Historical Provider, MD  cilostazol (PLETAL) 100 MG tablet Take 100 mg by mouth 2 (two) times daily.   Yes Historical Provider, MD  dorzolamide (TRUSOPT) 2 % ophthalmic solution Place 1 drop into both eyes 2 (two) times daily.   Yes Historical Provider, MD  spironolactone (ALDACTONE) 25 MG tablet Take 25 mg by mouth daily.  01/10/16  Yes Historical Provider, MD  acetaminophen (TYLENOL) 500 MG tablet Take 500 mg by mouth every 6 (six) hours as needed for mild pain.    Historical Provider, MD  cefUROXime (CEFTIN) 500 MG tablet Take 1 tablet (500 mg total) by mouth 2 (two) times daily with a meal. 01/30/16   Debbe Odea, MD  Elastic Bandages & Supports (T.E.D. BELOW  KNEE/XL) MISC 1 each by Does not apply route daily. 01/30/16   Debbe Odea, MD  hydrochlorothiazide (HYDRODIURIL) 25 MG tablet Take 1 tablet (25 mg total) by mouth daily. 01/30/16   Debbe Odea, MD   BP 115/58 mmHg  Pulse 100  Temp(Src) 98.2 F (36.8 C) (Oral)  Resp 18  SpO2 100% Physical Exam Physical Exam  Nursing note and vitals reviewed. Constitutional: Well developed, well nourished, non-toxic, and in no acute distress Head: Normocephalic and atraumatic.  Mouth/Throat: Oropharynx is clear and moist.  Neck: Normal range of motion. Neck supple.  Cardiovascular:  Normal rate and regular rhythm.   Pulmonary/Chest: Effort normal and breath sounds normal.  Abdominal: Soft. There is no tenderness. There is no rebound and no guarding.  Rectal: melanotic stool, guaiac positive stool, external hemorrhoids Musculoskeletal: Normal range of motion.  Neurological: Alert, no facial droop, fluent speech, moves all extremities symmetrically Skin: Skin is warm and dry.  Psychiatric: Cooperative  ED Course  Procedures (including critical care time) Labs Review Labs Reviewed  CBC - Abnormal; Notable for the following:    WBC 11.2 (*)    RBC 3.40 (*)    Hemoglobin 8.8 (*)    HCT 26.9 (*)    MCH 25.9 (*)    RDW 15.8 (*)    All other components within normal limits  COMPREHENSIVE METABOLIC PANEL - Abnormal; Notable for the following:    Sodium 133 (*)    Chloride 99 (*)    Glucose, Bld 142 (*)    BUN 33 (*)    Albumin 3.1 (*)    GFR calc non Af Amer 54 (*)    All other components within normal limits  POC OCCULT BLOOD, ED - Abnormal; Notable for the following:    Fecal Occult Bld POSITIVE (*)    All other components within normal limits    Imaging Review No results found. I have personally reviewed and evaluated these images and lab results as part of my medical decision-making.   EKG Interpretation None      MDM   Final diagnoses:  Upper GI bleed    75 year old female who presents with upper GI bleed. Hemodynamically stable and well appearing. Benign abdomen. Melena on rectal exam, guaiac negative. Hgb near baseline, 8.8 (baseline 9.4-11). Elevated BUN/Cr ratio. Asymptomatic. Started on protonix drip. Discussed with Dr. Cristina Gong who will see patient as inpatient. Admitted by Dr. Jonnie Finner to hospitalist service.    Forde Dandy, MD 01/31/16 [phone]

## 2016-01-31 NOTE — H&P (View-Only) (Signed)
[facility] Consult Note  Referring Provider: No ref. provider found Primary Care Physician:  Elyn Peers, MD Primary Gastroenterologist:  Dr.  Laurel Dimmer Complaint: Coffee ground emesis HPI: Brenda Kerr is an 75 y.o. black female  who presented after sudden onset of medium to large volume blackish emesis occurring about 6 PM yesterday. This was unwitnessed but she came to the emergency room. She has had no further emesis and had one small stool this morning that she did not look at. She never described any abdominal pain. Her hemoglobin was 10.3 and on recheck 9.4. She has a history of gastroesophageal reflux but is not on any prescription medication for it. She has no history of GI bleeding or peptic ulcer disease that she is aware of. She does take Advil for headaches and estimates approximately 3 doses a week. She is also on 81 mg aspirin. She is on no blood thinners.  She had been discharged earlier yesterday morning after a 3 day admission for right lower extremity cellulitis treated with Ceftin. She had received Lovenox injections.  Past Medical History  Diagnosis Date  . Hypertension   . Glaucoma   . Dysrhythmia   . Shortness of breath     walk a long way  . Peripheral vascular disease ([place])   . GERD (gastroesophageal reflux disease)   . Arthritis   . H/O hiatal hernia   . Diabetes mellitus without complication [facility])     Past Surgical History  Procedure Laterality Date  . Eye surgery    . Joint replacement      hip replacement  . Arthroplasty  03/09/2012    lt hip  . Total hip arthroplasty  03/09/2012    Procedure: TOTAL HIP ARTHROPLASTY;  Surgeon: Sharmon Revere, MD;  Location: [place];  Service: Orthopedics;  Laterality: Left;  . Left heart catheterization with coronary angiogram N/A 05/29/2014    Procedure: LEFT HEART CATHETERIZATION WITH CORONARY ANGIOGRAM;  Surgeon: Laverda Page, MD;  Location: [facility] CATH LAB;  Service: Cardiovascular;  Laterality: N/A;     Medications Prior to Admission  Medication Sig Dispense Refill  . amLODipine (NORVASC) 10 MG tablet Take 10 mg by mouth daily.   0  . aspirin EC 81 MG tablet Take 81 mg by mouth daily.    . bimatoprost (LUMIGAN) 0.01 % SOLN Place 1 drop into both eyes at bedtime.      . brimonidine-timolol (COMBIGAN) 0.2-0.5 % ophthalmic solution Place 1 drop into both eyes every 12 (twelve) hours.      . cilostazol (PLETAL) 100 MG tablet Take 100 mg by mouth 2 (two) times daily.    . dorzolamide (TRUSOPT) 2 % ophthalmic solution Place 1 drop into both eyes 2 (two) times daily.    Marland Kitchen spironolactone (ALDACTONE) 25 MG tablet Take 25 mg by mouth daily.   0  . acetaminophen (TYLENOL) 500 MG tablet Take 500 mg by mouth every 6 (six) hours as needed for mild pain.    . cefUROXime (CEFTIN) 500 MG tablet Take 1 tablet (500 mg total) by mouth 2 (two) times daily with a meal. 14 tablet 0  . Elastic Bandages & Supports (T.E.D. BELOW KNEE/XL) MISC 1 each by Does not apply route daily. 2 each 0  . hydrochlorothiazide (HYDRODIURIL) 25 MG tablet Take 1 tablet (25 mg total) by mouth daily. 30 tablet 0    Allergies:  Allergies  Allergen Reactions  . Codeine Nausea And Vomiting    No family history on  file.  Social History:  reports that she has never smoked. She has never used smokeless tobacco. She reports that she does not drink alcohol or use illicit drugs.  Review of Systems: negative except Intermittent reflux as mentioned above.   Blood pressure 154/64, pulse 103, temperature 97.8 F (36.6 C), temperature source Oral, resp. rate 18, SpO2 98 %. Head: Normocephalic, without obvious abnormality, atraumatic Neck: no adenopathy, no carotid bruit, no JVD, supple, symmetrical, trachea midline and thyroid not enlarged, symmetric, no tenderness/mass/nodules Resp: clear to auscultation bilaterally Cardio: regular rate and rhythm, S1, S2 normal, no murmur, click, rub or gallop GI: Abdomen soft, slightly distended,  with normoactive bowel sounds. No hepatosplenomegaly mass or guarding Extremities: extremities normal, atraumatic, no cyanosis or edema  Results for orders placed or performed during the hospital encounter of 01/30/16 (from the past 48 hour(s))  CBC     Status: Abnormal   Collection Time: 01/30/16  8:30 PM  Result Value Ref Range   WBC 11.2 (H) 4.0 - 10.5 K/uL   RBC 3.40 (L) 3.87 - 5.11 MIL/uL   Hemoglobin 8.8 (L) 12.0 - 15.0 g/dL   HCT 26.9 (L) 36.0 - 46.0 %   MCV 79.1 78.0 - 100.0 fL   MCH 25.9 (L) 26.0 - 34.0 pg   MCHC 32.7 30.0 - 36.0 g/dL   RDW 15.8 (H) 11.5 - 15.5 %   Platelets 317 150 - 400 K/uL  Comprehensive metabolic panel     Status: Abnormal   Collection Time: 01/30/16  8:30 PM  Result Value Ref Range   Sodium 133 (L) 135 - 145 mmol/L   Potassium 4.3 3.5 - 5.1 mmol/L   Chloride 99 (L) 101 - 111 mmol/L   CO2 25 22 - 32 mmol/L   Glucose, Bld 142 (H) 65 - 99 mg/dL   BUN 33 (H) 6 - 20 mg/dL   Creatinine, Ser 0.99 0.44 - 1.00 mg/dL   Calcium 9.0 8.9 - 10.3 mg/dL   Total Protein 6.8 6.5 - 8.1 g/dL   Albumin 3.1 (L) 3.5 - 5.0 g/dL   AST 18 15 - 41 U/L   ALT 15 14 - 54 U/L   Alkaline Phosphatase 72 38 - 126 U/L   Total Bilirubin 0.4 0.3 - 1.2 mg/dL   GFR calc non Af Amer 54 (L) >60 mL/min   GFR calc Af Amer >60 >60 mL/min    Comment: (NOTE) The eGFR has been calculated using the CKD EPI equation. This calculation has not been validated in all clinical situations. eGFR's persistently <60 mL/min signify possible Chronic Kidney Disease.    Anion gap 9 5 - 15  POC occult blood, ED     Status: Abnormal   Collection Time: 01/30/16 10:30 PM  Result Value Ref Range   Fecal Occult Bld POSITIVE (A) NEGATIVE  Glucose, capillary     Status: Abnormal   Collection Time: 01/31/16  2:36 AM  Result Value Ref Range   Glucose-Capillary 135 (H) 65 - 99 mg/dL  Basic metabolic panel     Status: Abnormal   Collection Time: 01/31/16  5:08 AM  Result Value Ref Range   Sodium 134 (L)  135 - 145 mmol/L   Potassium 3.7 3.5 - 5.1 mmol/L   Chloride 100 (L) 101 - 111 mmol/L   CO2 26 22 - 32 mmol/L   Glucose, Bld 111 (H) 65 - 99 mg/dL   BUN 30 (H) 6 - 20 mg/dL   Creatinine, Ser 0.91 0.44 - 1.00  mg/dL   Calcium 8.6 (L) 8.9 - 10.3 mg/dL   GFR calc non Af Amer >60 >60 mL/min   GFR calc Af Amer >60 >60 mL/min    Comment: (NOTE) The eGFR has been calculated using the CKD EPI equation. This calculation has not been validated in all clinical situations. eGFR's persistently <60 mL/min signify possible Chronic Kidney Disease.    Anion gap 8 5 - 15  CBC     Status: Abnormal   Collection Time: 01/31/16  5:08 AM  Result Value Ref Range   WBC 10.3 4.0 - 10.5 K/uL   RBC 2.79 (L) 3.87 - 5.11 MIL/uL   Hemoglobin 7.4 (L) 12.0 - 15.0 g/dL   HCT 22.5 (L) 36.0 - 46.0 %   MCV 80.6 78.0 - 100.0 fL   MCH 26.5 26.0 - 34.0 pg   MCHC 32.9 30.0 - 36.0 g/dL   RDW 16.1 (H) 11.5 - 15.5 %   Platelets 279 150 - 400 K/uL  Glucose, capillary     Status: Abnormal   Collection Time: 01/31/16  7:46 AM  Result Value Ref Range   Glucose-Capillary 120 (H) 65 - 99 mg/dL   No results found.  Assessment: 1. Coffee-ground emesis yesterday, unwitnessed, with elevated BUN/creatinine ratio suspicious for non-destabilizing upper GI bleeding. Plan:  1. PPI therapy 2. Aspirin and Lovenox on hold 3. EGD this afternoon. 4. Will follow with you Marrianne Sica C 01/31/2016, 8:00 AM  Pager [phone] If no answer or after 5 PM call [phone]

## 2016-01-31 NOTE — Progress Notes (Signed)
[facility]  75 y/o female discharged yesterday by myself after being treated for cellulitis of RLE. Had coffee ground vomiting at home yesterday and returned to the ER.  Transfuse 1 U PRBC today.  EGD today. Cont Protonix.  Continue oral antibiotics for cellulitis. Will follow.   Debbe Odea, MD

## 2016-01-31 NOTE — Op Note (Signed)
[facility] Patient Name: Brenda Kerr Procedure Date: 01/31/2016 MRN: [id number] Attending MD: Arta Silence , MD Date of Birth: 1940-10-17 CSN: [id number] Age: 75 Admit Type: Inpatient Procedure:                Upper GI endoscopy Indications:              Acute post hemorrhagic anemia, Coffee-ground                            emesis, Hematemesis Providers:                Arta Silence, MD, Vista Lawman, RN, Alfonso Patten,                            Technician, Dione Booze, CRNA Referring MD:             [facility] Medicines:                Monitored Anesthesia Care Complications:            No immediate complications. Estimated Blood Loss:     Estimated blood loss was minimal. Procedure:                Pre-Anesthesia Assessment:                           - Prior to the procedure, a History and Physical                            was performed, and patient medications and                            allergies were reviewed. The patient's tolerance of                            previous anesthesia was also reviewed. The risks                            and benefits of the procedure and the sedation                            options and risks were discussed with the patient.                            All questions were answered, and informed consent                            was obtained. Prior Anticoagulants: The patient has                            taken aspirin. ASA Grade Assessment: III - A                            patient with severe systemic disease. After  reviewing the risks and benefits, the patient was                            deemed in satisfactory condition to undergo the                            procedure.                           After obtaining informed consent, the endoscope was                            passed under direct vision. Throughout the                            procedure, the patient's blood  pressure, pulse, and                            oxygen saturations were monitored continuously. The                            [id number]) scope was introduced through the                            mouth, and advanced to the second part of duodenum.                            The upper GI endoscopy was accomplished without                            difficulty. The patient tolerated the procedure                            well. Scope In: Scope Out: Findings:      LA Grade C (one or more mucosal breaks continuous between tops of 2 or       more mucosal folds, less than 75% circumference) esophagitis was found.      A small hiatal hernia was present.      One mild benign-appearing, intrinsic stenosis was found.      One superficial esophageal ulcer was found at GE junction. There was       what appeared to be either adherent clot or large visible vessel. There       was no active bleeding witnessed. I was very reluctant to pursue       endoscopic therapy, for fear of precipitating life-threatening bleeding,       given the size of the region, as well as for fear of inciting       perforation, given the markedly inflamed nature of the distal esophagus.      The exam of the esophagus was otherwise normal.      The entire examined stomach was normal.      The duodenal bulb, first portion of the duodenum and second portion of       the duodenum were normal. Impression:               - LA  Grade C reflux esophagitis.                           - Small hiatal hernia.                           - Benign-appearing esophageal stenosis.                           - Esophageal ulcer with large adherent clot versus                            non-bleeding visible vessel. Source of patient's                            hematemesis, and at least likely in part incited by                            patient's use of NSAIDs.                           - Normal stomach.                           -  Normal duodenal bulb, first portion of the                            duodenum and second portion of the duodenum.                           - No specimens collected. Moderate Sedation:      None Recommendation:           - Return patient to hospital ward for ongoing care.                           - Give Protonix (pantoprazole): initiate therapy                            with 80 mg IV bolus, then 8 mg/hr IV by continuous                            infusion until further notice. Needs pantoprazole                            drip for AT LEAST the next 24-36 hours.                           - Clear liquid diet today. Do not advance today.                           - No aspirin, ibuprofen, naproxen, or other                            non-steroidal anti-inflammatory drugs until further  notice.                           [facility] will follow. If patient has overt                            destabilizing recurrent bleeding, she will need                            repeat endoscopy with hemostatic therapy, mindful                            of the not insignificant risks of hemostatic                            therapy in this particular situation (see above). Procedure Code(s):        --- Professional ---                           [phone], Esophagogastroduodenoscopy, flexible,                            transoral; diagnostic, including collection of                            specimen(s) by brushing or washing, when performed                            (separate procedure) Diagnosis Code(s):        --- Professional ---                           K21.0, Gastro-esophageal reflux disease with                            esophagitis                           K44.9, Diaphragmatic hernia without obstruction or                            gangrene                           K22.2, Esophageal obstruction                           K22.10, Ulcer of esophagus without bleeding                            D62, Acute posthemorrhagic anemia                           K92.0, Hematemesis CPT copyright 2016 [employer] Association. All rights reserved. The codes documented in this report are preliminary and upon coder review may  be revised to meet current compliance requirements. Arta Silence, MD 01/31/2016 2:02:20 PM This report has been signed electronically. Number  of Addenda: 0

## 2016-01-31 NOTE — Care Management Note (Signed)
Case Management Note  Patient Details  Name: Brenda Kerr MRN: [id number] Date of Birth: [birth year]  Subjective/Objective:                 Gi bleeding   Action/Plan:Date:  January 31, 2016 Chart reviewed for concurrent status and case management needs. Will continue to follow the patient for changes and needs: Expected discharge date: [id number] Velva Harman, BSN, [place], [place]   Expected Discharge Date:  02/02/16               Expected Discharge Plan:  Home/Self Care  In-House Referral:  NA  Discharge planning Services  CM Consult  Post Acute Care Choice:  NA Choice offered to:  NA  DME Arranged:    DME Agency:     HH Arranged:    HH Agency:     Status of Service:  In process, will continue to follow  Medicare Important Message Given:    Date Medicare IM Given:    Medicare IM give by:    Date Additional Medicare IM Given:    Additional Medicare Important Message give by:     If discussed at [place] of Stay Meetings, dates discussed:    Additional Comments:  Leeroy Cha, RN 01/31/2016, 10:22 AM

## 2016-01-31 NOTE — Anesthesia Preprocedure Evaluation (Addendum)
Anesthesia Evaluation  Patient identified by MRN, date of birth, ID band Patient awake    Reviewed: Allergy & Precautions, NPO status , Patient's Chart, lab work & pertinent test results  Airway Mallampati: III  TM Distance: >3 FB Neck ROM: Full    Dental  (+) Lower Dentures, Upper Dentures   Pulmonary shortness of breath and with exertion,    Pulmonary exam normal breath sounds clear to auscultation       Cardiovascular hypertension, + Peripheral Vascular Disease and +CHF  Normal cardiovascular exam+ dysrhythmias  Rhythm:Regular Rate:Normal     Neuro/Psych Hx/o recurrent falls Glaucoma negative psych ROS   GI/Hepatic Neg liver ROS, hiatal hernia, GERD  Medicated and Controlled,Hematemesis   Endo/Other  diabetes, Well Controlled, Type 2  Renal/GU   negative genitourinary   Musculoskeletal  (+) Arthritis , Osteoarthritis,    Abdominal (+) + obese,   Peds  Hematology  (+) anemia ,   Anesthesia Other Findings   Reproductive/Obstetrics                          Anesthesia Physical Anesthesia Plan  ASA: III  Anesthesia Plan: MAC   Post-op Pain Management:    Induction: Intravenous  Airway Management Planned: Natural Airway, Nasal Cannula and Simple Face Mask  Additional Equipment:   Intra-op Plan:   Post-operative Plan:   Informed Consent: I have reviewed the patients History and Physical, chart, labs and discussed the procedure including the risks, benefits and alternatives for the proposed anesthesia with the patient or authorized representative who has indicated his/her understanding and acceptance.   Dental advisory given  Plan Discussed with: Anesthesiologist, CRNA and Surgeon  Anesthesia Plan Comments:         Anesthesia Quick Evaluation

## 2016-01-31 NOTE — Anesthesia Postprocedure Evaluation (Signed)
Anesthesia Post Note  Patient: Brenda Kerr  Procedure(s) Performed: Procedure(s) (LRB): ESOPHAGOGASTRODUODENOSCOPY (EGD) (Left)  Patient location during evaluation: PACU Anesthesia Type: MAC Level of consciousness: awake and alert Pain management: pain level controlled Vital Signs Assessment: post-procedure vital signs reviewed and stable Respiratory status: spontaneous breathing, nonlabored ventilation, respiratory function stable and patient connected to nasal cannula oxygen Cardiovascular status: stable and blood pressure returned to baseline Anesthetic complications: no    Last Vitals:  Filed Vitals:   01/31/16 1410 01/31/16 1420  BP: 138/65 142/53  Pulse: 81 84  Temp:    Resp: 18 14    Last Pain:  Filed Vitals:   01/31/16 1429  PainSc: 0-No pain                 Effie Berkshire

## 2016-01-31 NOTE — Consult Note (Signed)
[facility] Consult Note  Referring Provider: No ref. provider found Primary Care Physician:  Elyn Peers, MD Primary Gastroenterologist:  Dr.  Laurel Dimmer Complaint: Coffee ground emesis HPI: Brenda Kerr is an 75 y.o. black female  who presented after sudden onset of medium to large volume blackish emesis occurring about 6 PM yesterday. This was unwitnessed but she came to the emergency room. She has had no further emesis and had one small stool this morning that she did not look at. She never described any abdominal pain. Her hemoglobin was 10.3 and on recheck 9.4. She has a history of gastroesophageal reflux but is not on any prescription medication for it. She has no history of GI bleeding or peptic ulcer disease that she is aware of. She does take Advil for headaches and estimates approximately 3 doses a week. She is also on 81 mg aspirin. She is on no blood thinners.  She had been discharged earlier yesterday morning after a 3 day admission for right lower extremity cellulitis treated with Ceftin. She had received Lovenox injections.  Past Medical History  Diagnosis Date  . Hypertension   . Glaucoma   . Dysrhythmia   . Shortness of breath     walk a long way  . Peripheral vascular disease ([place])   . GERD (gastroesophageal reflux disease)   . Arthritis   . H/O hiatal hernia   . Diabetes mellitus without complication [facility])     Past Surgical History  Procedure Laterality Date  . Eye surgery    . Joint replacement      hip replacement  . Arthroplasty  03/09/2012    lt hip  . Total hip arthroplasty  03/09/2012    Procedure: TOTAL HIP ARTHROPLASTY;  Surgeon: Sharmon Revere, MD;  Location: [place];  Service: Orthopedics;  Laterality: Left;  . Left heart catheterization with coronary angiogram N/A 05/29/2014    Procedure: LEFT HEART CATHETERIZATION WITH CORONARY ANGIOGRAM;  Surgeon: Laverda Page, MD;  Location: [facility] CATH LAB;  Service: Cardiovascular;  Laterality: N/A;     Medications Prior to Admission  Medication Sig Dispense Refill  . amLODipine (NORVASC) 10 MG tablet Take 10 mg by mouth daily.   0  . aspirin EC 81 MG tablet Take 81 mg by mouth daily.    . bimatoprost (LUMIGAN) 0.01 % SOLN Place 1 drop into both eyes at bedtime.      . brimonidine-timolol (COMBIGAN) 0.2-0.5 % ophthalmic solution Place 1 drop into both eyes every 12 (twelve) hours.      . cilostazol (PLETAL) 100 MG tablet Take 100 mg by mouth 2 (two) times daily.    . dorzolamide (TRUSOPT) 2 % ophthalmic solution Place 1 drop into both eyes 2 (two) times daily.    Marland Kitchen spironolactone (ALDACTONE) 25 MG tablet Take 25 mg by mouth daily.   0  . acetaminophen (TYLENOL) 500 MG tablet Take 500 mg by mouth every 6 (six) hours as needed for mild pain.    . cefUROXime (CEFTIN) 500 MG tablet Take 1 tablet (500 mg total) by mouth 2 (two) times daily with a meal. 14 tablet 0  . Elastic Bandages & Supports (T.E.D. BELOW KNEE/XL) MISC 1 each by Does not apply route daily. 2 each 0  . hydrochlorothiazide (HYDRODIURIL) 25 MG tablet Take 1 tablet (25 mg total) by mouth daily. 30 tablet 0    Allergies:  Allergies  Allergen Reactions  . Codeine Nausea And Vomiting    No family history on  file.  Social History:  reports that she has never smoked. She has never used smokeless tobacco. She reports that she does not drink alcohol or use illicit drugs.  Review of Systems: negative except Intermittent reflux as mentioned above.   Blood pressure 154/64, pulse 103, temperature 97.8 F (36.6 C), temperature source Oral, resp. rate 18, SpO2 98 %. Head: Normocephalic, without obvious abnormality, atraumatic Neck: no adenopathy, no carotid bruit, no JVD, supple, symmetrical, trachea midline and thyroid not enlarged, symmetric, no tenderness/mass/nodules Resp: clear to auscultation bilaterally Cardio: regular rate and rhythm, S1, S2 normal, no murmur, click, rub or gallop GI: Abdomen soft, slightly distended,  with normoactive bowel sounds. No hepatosplenomegaly mass or guarding Extremities: extremities normal, atraumatic, no cyanosis or edema  Results for orders placed or performed during the hospital encounter of 01/30/16 (from the past 48 hour(s))  CBC     Status: Abnormal   Collection Time: 01/30/16  8:30 PM  Result Value Ref Range   WBC 11.2 (H) 4.0 - 10.5 K/uL   RBC 3.40 (L) 3.87 - 5.11 MIL/uL   Hemoglobin 8.8 (L) 12.0 - 15.0 g/dL   HCT 26.9 (L) 36.0 - 46.0 %   MCV 79.1 78.0 - 100.0 fL   MCH 25.9 (L) 26.0 - 34.0 pg   MCHC 32.7 30.0 - 36.0 g/dL   RDW 15.8 (H) 11.5 - 15.5 %   Platelets 317 150 - 400 K/uL  Comprehensive metabolic panel     Status: Abnormal   Collection Time: 01/30/16  8:30 PM  Result Value Ref Range   Sodium 133 (L) 135 - 145 mmol/L   Potassium 4.3 3.5 - 5.1 mmol/L   Chloride 99 (L) 101 - 111 mmol/L   CO2 25 22 - 32 mmol/L   Glucose, Bld 142 (H) 65 - 99 mg/dL   BUN 33 (H) 6 - 20 mg/dL   Creatinine, Ser 0.99 0.44 - 1.00 mg/dL   Calcium 9.0 8.9 - 10.3 mg/dL   Total Protein 6.8 6.5 - 8.1 g/dL   Albumin 3.1 (L) 3.5 - 5.0 g/dL   AST 18 15 - 41 U/L   ALT 15 14 - 54 U/L   Alkaline Phosphatase 72 38 - 126 U/L   Total Bilirubin 0.4 0.3 - 1.2 mg/dL   GFR calc non Af Amer 54 (L) >60 mL/min   GFR calc Af Amer >60 >60 mL/min    Comment: (NOTE) The eGFR has been calculated using the CKD EPI equation. This calculation has not been validated in all clinical situations. eGFR's persistently <60 mL/min signify possible Chronic Kidney Disease.    Anion gap 9 5 - 15  POC occult blood, ED     Status: Abnormal   Collection Time: 01/30/16 10:30 PM  Result Value Ref Range   Fecal Occult Bld POSITIVE (A) NEGATIVE  Glucose, capillary     Status: Abnormal   Collection Time: 01/31/16  2:36 AM  Result Value Ref Range   Glucose-Capillary 135 (H) 65 - 99 mg/dL  Basic metabolic panel     Status: Abnormal   Collection Time: 01/31/16  5:08 AM  Result Value Ref Range   Sodium 134 (L)  135 - 145 mmol/L   Potassium 3.7 3.5 - 5.1 mmol/L   Chloride 100 (L) 101 - 111 mmol/L   CO2 26 22 - 32 mmol/L   Glucose, Bld 111 (H) 65 - 99 mg/dL   BUN 30 (H) 6 - 20 mg/dL   Creatinine, Ser 0.91 0.44 - 1.00  mg/dL   Calcium 8.6 (L) 8.9 - 10.3 mg/dL   GFR calc non Af Amer >60 >60 mL/min   GFR calc Af Amer >60 >60 mL/min    Comment: (NOTE) The eGFR has been calculated using the CKD EPI equation. This calculation has not been validated in all clinical situations. eGFR's persistently <60 mL/min signify possible Chronic Kidney Disease.    Anion gap 8 5 - 15  CBC     Status: Abnormal   Collection Time: 01/31/16  5:08 AM  Result Value Ref Range   WBC 10.3 4.0 - 10.5 K/uL   RBC 2.79 (L) 3.87 - 5.11 MIL/uL   Hemoglobin 7.4 (L) 12.0 - 15.0 g/dL   HCT 22.5 (L) 36.0 - 46.0 %   MCV 80.6 78.0 - 100.0 fL   MCH 26.5 26.0 - 34.0 pg   MCHC 32.9 30.0 - 36.0 g/dL   RDW 16.1 (H) 11.5 - 15.5 %   Platelets 279 150 - 400 K/uL  Glucose, capillary     Status: Abnormal   Collection Time: 01/31/16  7:46 AM  Result Value Ref Range   Glucose-Capillary 120 (H) 65 - 99 mg/dL   No results found.  Assessment: 1. Coffee-ground emesis yesterday, unwitnessed, with elevated BUN/creatinine ratio suspicious for non-destabilizing upper GI bleeding. Plan:  1. PPI therapy 2. Aspirin and Lovenox on hold 3. EGD this afternoon. 4. Will follow with you Rheya Minogue C 01/31/2016, 8:00 AM  Pager [phone] If no answer or after 5 PM call [phone]

## 2016-01-31 NOTE — Transfer of Care (Signed)
Immediate Anesthesia Transfer of Care Note  Patient: Brenda Kerr  Procedure(s) Performed: Procedure(s): ESOPHAGOGASTRODUODENOSCOPY (EGD) (Left)  Patient Location: PACU and Endoscopy Unit  Anesthesia Type:MAC  Level of Consciousness: patient cooperative  Airway & Oxygen Therapy: Patient Spontanous Breathing and Patient connected to nasal cannula oxygen  Post-op Assessment: Report given to RN and Post -op Vital signs reviewed and stable  Post vital signs: Reviewed and stable  Last Vitals:  Filed Vitals:   01/31/16 0529 01/31/16 1240  BP: 154/64 163/64  Pulse: 103 91  Temp: 36.6 C 36.9 C  Resp: 18 13    Last Pain:  Filed Vitals:   01/31/16 1242  PainSc: 0-No pain         Complications: No apparent anesthesia complications

## 2016-01-31 NOTE — H&P (Signed)
[facility] History and Physical  Brenda Kerr [id number] DOB: October 24, 1940 DOA: 01/30/2016  Referring physician: Dr Oleta Mouse, ED PCP: Elyn Peers, MD   Chief Complaint: Hematemesis  HPI: Brenda Kerr is a 75 y.o. female with history of HTN, glaucoma, PVD and obesity who presented to ED with vomiting blackish coffee-ground material.  Hx of GERD, no hx of GIB.  No bloody stools or abd pain.  Patient was just dc'd home earlier today after 2-3 day admission for LE cellulitis involving the R leg primarily.  She had IV abx for 2 days then dc'd home on Ceftin w improving RLE symptoms. She was getting lovenox shots for DVT prevention while here. She has also been having a lot of falls, felt to be due to deconditioning related to leg swelling.  Pt agreed to get more exercise. Her Hb on admission was 10.3 ,repeat was 9.4.   Not on blood thinners at home.    Amlodipine, aspirin, Pletal, aldactone, Ceftin, HCTZ  Patient is a vague historian.  She grew up in [place] to [place] home and raised 7 kids, 5 dtrs and 2 sons.  Lives with her husband now outside of [place].  No tob , etoh.     ROS  denies CP  no joint pain   no HA  no blurry vision  no rash  no diarrhea  no nausea/ vomiting  no dysuria  no difficulty voiding  no change in urine color    Past Medical History  Past Medical History  Diagnosis Date  . Hypertension   . Glaucoma   . Dysrhythmia   . Shortness of breath     walk a long way  . Peripheral vascular disease ([place])   . GERD (gastroesophageal reflux disease)   . Arthritis   . H/O hiatal hernia   . Diabetes mellitus without complication [facility])    Past Surgical History  Past Surgical History  Procedure Laterality Date  . Eye surgery    . Joint replacement      hip replacement  . Arthroplasty  03/09/2012    lt hip  . Total hip arthroplasty  03/09/2012    Procedure: TOTAL HIP ARTHROPLASTY;  Surgeon: Sharmon Revere, MD;   Location: [place];  Service: Orthopedics;  Laterality: Left;  . Left heart catheterization with coronary angiogram N/A 05/29/2014    Procedure: LEFT HEART CATHETERIZATION WITH CORONARY ANGIOGRAM;  Surgeon: Laverda Page, MD;  Location: [facility] CATH LAB;  Service: Cardiovascular;  Laterality: N/A;   Family History No family history on file. Social History  reports that she has never smoked. She has never used smokeless tobacco. She reports that she does not drink alcohol or use illicit drugs. Allergies  Allergies  Allergen Reactions  . Codeine Nausea And Vomiting   Home medications Prior to Admission medications   Medication Sig Start Date End Date Taking? Authorizing Provider  amLODipine (NORVASC) 10 MG tablet Take 10 mg by mouth daily.  12/21/15  Yes Historical Provider, MD  aspirin EC 81 MG tablet Take 81 mg by mouth daily.   Yes Historical Provider, MD  bimatoprost (LUMIGAN) 0.01 % SOLN Place 1 drop into both eyes at bedtime.     Yes Historical Provider, MD  brimonidine-timolol (COMBIGAN) 0.2-0.5 % ophthalmic solution Place 1 drop into both eyes every 12 (twelve) hours.     Yes Historical Provider, MD  cilostazol (PLETAL) 100 MG tablet Take 100 mg by mouth 2 (two) times daily.  Yes Historical Provider, MD  dorzolamide (TRUSOPT) 2 % ophthalmic solution Place 1 drop into both eyes 2 (two) times daily.   Yes Historical Provider, MD  spironolactone (ALDACTONE) 25 MG tablet Take 25 mg by mouth daily.  01/10/16  Yes Historical Provider, MD  acetaminophen (TYLENOL) 500 MG tablet Take 500 mg by mouth every 6 (six) hours as needed for mild pain.    Historical Provider, MD  cefUROXime (CEFTIN) 500 MG tablet Take 1 tablet (500 mg total) by mouth 2 (two) times daily with a meal. 01/30/16   Debbe Odea, MD  Elastic Bandages & Supports (T.E.D. BELOW KNEE/XL) MISC 1 each by Does not apply route daily. 01/30/16   Debbe Odea, MD  hydrochlorothiazide (HYDRODIURIL) 25 MG tablet Take 1 tablet (25 mg total) by mouth  daily. 01/30/16   Debbe Odea, MD   Liver Function Tests  Recent Labs Lab 01/27/16 2222 01/30/16 2030  AST 20 18  ALT 16 15  ALKPHOS 85 72  BILITOT 0.6 0.4  PROT 7.2 6.8  ALBUMIN 3.3* 3.1*   No results for input(s): LIPASE, AMYLASE in the last 168 hours. CBC  Recent Labs Lab 01/27/16 2222 01/28/16 0453 01/30/16 2030  WBC 11.3* 10.5 11.2*  NEUTROABS 8.3*  --   --   HGB 10.3* 9.4* 8.8*  HCT 31.6* 29.1* 26.9*  MCV 81.2 81.1 79.1  PLT 314 270 [id number]   Basic Metabolic Panel  Recent Labs Lab 01/27/16 2222 01/28/16 0453 01/30/16 2030  NA 133* 139 133*  K 3.9 4.1 4.3  CL 101 108 99*  CO2 25 25 25   GLUCOSE 115* 118* 142*  BUN 22* 18 33*  CREATININE 1.00 0.72 0.99  CALCIUM 8.8* 8.6* 9.0     Filed Vitals:   01/30/16 2005  BP: 115/58  Pulse: 100  Temp: 98.2 F (36.8 C)  TempSrc: Oral  Resp: 18  SpO2: 100%   Exam: Gen alert, pleasant no distress No rash, cyanosis or gangrene Sclera anicteric, throat clear  No jvd or bruits Chest clear bilat RRR no MRG Abd soft ntnd no mass or ascites +bs GU deferred MS no joint effusions or deformity Ext 1-2+ bilat pretib edema R > L, some erythema bilat lower legs R > L, some skin breakdown also R > L on the shins.  Neuro is alert, Ox 3 , nf, pleasant   ECHO 5/31 > grade 1 diast dysfunction,normal EF  Na 133  K 4.3  CO2 99  BUN 33  Creat 0.99 glu 142   Alb 3.1  LFT's ok WBC 11.2  Hb 8.8  plt 317     Assessment: 1  Hematemesis - prob PUD related to nsaid's, also was getting lovenox shots while here for DVT prev. Plan dc nsaids', admit, serial H/H, PPI.  GI aware , notfied by ED MD, will see tomorrow.   2  Bilat LEedema - possible chron diastolic CHF.  Affecting her mobility and having lots of falls.  Plan dc HCTZ/aldactone and add low dose po lasix, try to get legs down.    3   HTN cont  Norvasc, start lasix 20 bid,reduce at dc 4   DM not on any meds 5   Obesity 6   Anemia due to GIB 7   LE cellultis-  Will cont po  Ceftin for infection, get legs down w lasix.  8   Falls/ debility - get PT to evaluate while here.  See if diuresing helps her moblitiy   Plan -  as above     [facility] Pager [phone]  Cell [phone]  If 7PM-7AM, please contact night-coverage [email or web address] Password [email or web address] 01/31/2016, 1:22 AM

## 2016-01-31 NOTE — Interval H&P Note (Signed)
History and Physical Interval Note:  01/31/2016 1:29 PM  Brenda Kerr  has presented today for surgery, with the diagnosis of Hematemesis  The various methods of treatment have been discussed with the patient and family. After consideration of risks, benefits and other options for treatment, the patient has consented to  Procedure(s): ESOPHAGOGASTRODUODENOSCOPY (EGD) (Left) as a surgical intervention .  The patient's history has been reviewed, patient examined, no change in status, stable for surgery.  I have reviewed the patient's chart and labs.  Questions were answered to the patient's satisfaction.     Landry Dyke

## 2016-02-01 DIAGNOSIS — I10 Essential (primary) hypertension: Secondary | ICD-10-CM

## 2016-02-01 DIAGNOSIS — R5381 Other malaise: Secondary | ICD-10-CM

## 2016-02-01 DIAGNOSIS — K2211 Ulcer of esophagus with bleeding: Principal | ICD-10-CM

## 2016-02-01 DIAGNOSIS — L03115 Cellulitis of right lower limb: Secondary | ICD-10-CM

## 2016-02-01 NOTE — Progress Notes (Signed)
Patient ID: Brenda Kerr, female   DOB: [birth year], 75 y.o.   MRN: [id number] [facility] Progress Note  PRESCILLA Kerr 75 y.o. [birth year]   Subjective: Feels ok. Tolerating liquids. Wants to eat. Denies epigastric pain, nausea, or vomiting. Has had occasional right sided pain that she reports has been occurring prior to admit and unrelated to eating. Reports a history of back pain.  Objective: Vital signs in last 24 hours: Filed Vitals:   01/31/16 2115 02/01/16 0535  BP: 138/54 129/42  Pulse: 85 83  Temp: 98.7 F (37.1 C) 98.5 F (36.9 C)  Resp: 19 16    Physical Exam: Gen: alert, no acute distress HEENT: anicteric sclera CV: RRR Chest: CTA B Abd: right sided tenderness with voluntary guarding, otherwise nontender, soft, nondistended, +BS Ext: no edema  Lab Results:  Recent Labs  01/30/16 2030 01/31/16 0508  NA 133* 134*  K 4.3 3.7  CL 99* 100*  CO2 25 26  GLUCOSE 142* 111*  BUN 33* 30*  CREATININE 0.99 0.91  CALCIUM 9.0 8.6*    Recent Labs  01/30/16 2030  AST 18  ALT 15  ALKPHOS 72  BILITOT 0.4  PROT 6.8  ALBUMIN 3.1*    Recent Labs  01/31/16 0508 01/31/16 2031  WBC 10.3 10.3  HGB 7.4* 8.3*  HCT 22.5* 25.0*  MCV 80.6 81.7  PLT 279 276   No results for input(s): LABPROT, INR in the last 72 hours.    Assessment/Plan: S/P GI bleed with distal esophageal ulcer and adherent clot vs nonbleeding visible vessel seen. No further bleeding. Hgb stable. Change to full liquids and advance to soft foods as tolerated. Continue Protonix drip. Consider d/c tomorrow on PPI PO BID if Hgb stable vs. Home on Monday. Will sign off. Call if questions.   [place] C. 02/01/2016, 1:08 PM  Pager [phone]  If no answer or after 5 PM call [phone]

## 2016-02-01 NOTE — Progress Notes (Signed)
PROGRESS NOTE    Brenda Kerr  [id number] DOB: [birth year] DOA: 01/30/2016  PCP: Brenda Peers, MD   History of present illness:  Brenda Kerr is a city 75-year-old female with a past medical history of hypertension diabetes mellitus, admitted this morning for right lower extremity cellulitis. She reports having recurrent falls over the last several months, having her last fall about 2-1/2 weeks ago. She stated injuring her right shin and noticed worsening erythema and pain to the area several days later. Symptoms have progressively worsened for which she presented to the emergency department overnight. She was found to have cellulitis and started on antibiotic therapy. She was dischared on Vantin and returned the same night after vomiting coffee ground material at home. Underwent EGD which showed an esophageal ulcer with adherent clot.   Hospital Course:   Hematemesis- esophageal ulcer - admits to a h/o heartburn and reflux - cont clears and IV Protonix infusion per GI - discussed need to stop all NSAIDs including daily aspirin and avoid Doxycycline and alcohol  Right lower extremity cellulitis -Brenda Kerr reporting having a fall about 2 and half weeks ago that resulted in an injury to her right shin. -Since then she has noted increasing erythema, pain, swelling involving her right lower extremity. -Symptoms appear consistent with cellulitis- Has improved with Ceftriaxone - ceftriaxonechanged to Ceftin   - she's noted significant improvement in pain and swelling  Chronic venous stasis - TEDS- elevate as much as possible- she admits that she has been inactive lately and has been sitting in a chair with feet in dependent position  Recurrent falls -She reports having recurrent falls over the past several months, most recently resulting in injury to her right leg complicated by infection. -X-ray of right tibia/fibula show diffuse subcutaneous edema without evidence of fracture -She  feels that increase in edema involving lower extremities have contributed to her falls and feels that she has gained weight. She has so reports having exertional dyspnea. -Physical therapy consulted  Exertional dyspnea. -She reported having exertional dyspnea along with increasing bilateral extremity pitting edema and feels that this has contributed to her falls. - BNP checked by Brenda Kerr- normal - ECHO does not reveal and abormalities - likely decondioned- pulse ox on ambulation is 100%- discussed to increase activity which she plans to do   Hypertension. -Patient's blood pressure is elevated with systolic blood pressures [id number] to 160s. -She is on amlodipine 10 mg by mouth daily- as it may be contributing to pedal edema, will d/c - Brenda Kerr added hydrochlorothiazide 25 mg by mouth daily   DVT prophylaxis: SCDs Code Status: full code Family Communication: husband Disposition Plan: home in 1-2 days as long a no bleeding Consultants:   GI Procedures:   EGD Antimicrobials:  Anti-infectives    Start     Dose/Rate Route Frequency Ordered Stop   01/31/16 0800  cefUROXime (CEFTIN) tablet 500 mg     500 mg Oral 2 times daily with meals 01/31/16 0342         Objective: Filed Vitals:   01/31/16 1534 01/31/16 1742 01/31/16 2115 02/01/16 0535  BP: 121/62 148/86 138/54 129/42  Pulse: 78 88 85 83  Temp: 97.7 F (36.5 C) 97.9 F (36.6 C) 98.7 F (37.1 C) 98.5 F (36.9 C)  TempSrc: Oral Oral Oral Oral  Resp: 16 18 19 16   Height:      Weight:      SpO2: 93% 100% 100% 100%    Intake/Output Summary (Last  24 hours) at 02/01/16 1024 Last data filed at 01/31/16 1900  Gross per 24 hour  Intake   1080 ml  Output      0 ml  Net   1080 ml   Filed Weights   01/31/16 1240  Weight: 93.5 kg (206 lb 2.1 oz)    Examination: General exam: Appears comfortable  HEENT: PERRLA, oral mucosa moist, no sclera icterus or thrush Respiratory system: Clear to auscultation. Respiratory  effort normal. Cardiovascular system: S1 & S2 heard, RRR.  No murmurs  Gastrointestinal system: Abdomen soft, non-tender, nondistended. Normal bowel sound. No organomegaly Central nervous system: Alert and oriented. No focal neurological deficits. Extremities: No cyanosis, clubbing or edema Skin: shallow ulcers on b/l lower shins- no cellulitis  Psychiatry:  Mood & affect appropriate.     Data Reviewed: I have personally reviewed following labs and imaging studies  CBC:  Recent Labs Lab 01/27/16 2222 01/28/16 0453 01/30/16 2030 01/31/16 0508 01/31/16 2031  WBC 11.3* 10.5 11.2* 10.3 10.3  NEUTROABS 8.3*  --   --   --   --   HGB 10.3* 9.4* 8.8* 7.4* 8.3*  HCT 31.6* 29.1* 26.9* 22.5* 25.0*  MCV 81.2 81.1 79.1 80.6 81.7  PLT 314 270 317 279 [id number]   Basic Metabolic Panel:  Recent Labs Lab 01/27/16 2222 01/28/16 0453 01/30/16 2030 01/31/16 0508  NA 133* 139 133* 134*  K 3.9 4.1 4.3 3.7  CL 101 108 99* 100*  CO2 25 25 25 26   GLUCOSE 115* 118* 142* 111*  BUN 22* 18 33* 30*  CREATININE 1.00 0.72 0.99 0.91  CALCIUM 8.8* 8.6* 9.0 8.6*   GFR: Estimated Creatinine Clearance: 56.9 mL/min (by C-G formula based on Cr of 0.91). Liver Function Tests:  Recent Labs Lab 01/27/16 2222 01/30/16 2030  AST 20 18  ALT 16 15  ALKPHOS 85 72  BILITOT 0.6 0.4  PROT 7.2 6.8  ALBUMIN 3.3* 3.1*   No results for input(s): LIPASE, AMYLASE in the last 168 hours. No results for input(s): AMMONIA in the last 168 hours. Coagulation Profile: No results for input(s): INR, PROTIME in the last 168 hours. Cardiac Enzymes: No results for input(s): CKTOTAL, CKMB, CKMBINDEX, TROPONINI in the last 168 hours. BNP (last 3 results) No results for input(s): PROBNP in the last 8760 hours. HbA1C: No results for input(s): HGBA1C in the last 72 hours. CBG:  Recent Labs Lab 01/31/16 0236 01/31/16 0746  GLUCAP 135* 120*   Lipid Profile: No results for input(s): CHOL, HDL, LDLCALC, TRIG, CHOLHDL,  LDLDIRECT in the last 72 hours. Thyroid Function Tests: No results for input(s): TSH, T4TOTAL, FREET4, T3FREE, THYROIDAB in the last 72 hours. Anemia Panel: No results for input(s): VITAMINB12, FOLATE, FERRITIN, TIBC, IRON, RETICCTPCT in the last 72 hours. Urine analysis:    Component Value Date/Time   COLORURINE YELLOW 01/31/2016 0839   APPEARANCEUR CLEAR 01/31/2016 0839   LABSPEC 1.008 01/31/2016 0839   PHURINE 6.0 01/31/2016 0839   GLUCOSEU NEGATIVE 01/31/2016 0839   HGBUR TRACE* 01/31/2016 0839   BILIRUBINUR NEGATIVE 01/31/2016 0839   KETONESUR NEGATIVE 01/31/2016 0839   PROTEINUR NEGATIVE 01/31/2016 0839   UROBILINOGEN 0.2 03/04/2012 1320   NITRITE NEGATIVE 01/31/2016 0839   LEUKOCYTESUR NEGATIVE 01/31/2016 0839   Sepsis Labs: @LABRCNTIP (procalcitonin:4,lacticidven:4) )No results found for this or any previous visit (from the past 240 hour(s)).       Radiology Studies: No results found.    Scheduled Meds: . amLODipine  10 mg Oral Daily  . brimonidine  1 drop Both Eyes Q12H   And  . timolol  1 drop Both Eyes BID  . cefUROXime  500 mg Oral BID WC  . dorzolamide  1 drop Both Eyes BID  . latanoprost  1 drop Both Eyes QHS  . [START ON 02/03/2016] pantoprazole  40 mg Intravenous Q12H  . sodium chloride flush  3 mL Intravenous Q12H   Continuous Infusions: . pantoprozole (PROTONIX) infusion 8 mg/hr (02/01/16 0410)     LOS: 1 day    Time spent in minutes: 68    [place], MD [facility] Pager: [email or web address] Password [email or web address] 02/01/2016, 10:24 AM

## 2016-02-02 DIAGNOSIS — I83029 Varicose veins of left lower extremity with ulcer of unspecified site: Secondary | ICD-10-CM

## 2016-02-02 DIAGNOSIS — L97919 Non-pressure chronic ulcer of unspecified part of right lower leg with unspecified severity: Secondary | ICD-10-CM

## 2016-02-02 DIAGNOSIS — I83019 Varicose veins of right lower extremity with ulcer of unspecified site: Secondary | ICD-10-CM

## 2016-02-02 DIAGNOSIS — L97929 Non-pressure chronic ulcer of unspecified part of left lower leg with unspecified severity: Secondary | ICD-10-CM

## 2016-02-02 DIAGNOSIS — R600 Localized edema: Secondary | ICD-10-CM

## 2016-02-02 DIAGNOSIS — W19XXXA Unspecified fall, initial encounter: Secondary | ICD-10-CM

## 2016-02-02 DIAGNOSIS — I872 Venous insufficiency (chronic) (peripheral): Secondary | ICD-10-CM

## 2016-02-02 DIAGNOSIS — D62 Acute posthemorrhagic anemia: Secondary | ICD-10-CM

## 2016-02-02 LAB — CBC
HEMATOCRIT: 24.5 % — AB (ref 36.0–46.0)
HEMOGLOBIN: 7.9 g/dL — AB (ref 12.0–15.0)
MCH: 26.2 pg (ref 26.0–34.0)
MCHC: 32.2 g/dL (ref 30.0–36.0)
MCV: 81.1 fL (ref 78.0–100.0)
Platelets: 291 10*3/uL (ref 150–400)
RBC: 3.02 MIL/uL — ABNORMAL LOW (ref 3.87–5.11)
RDW: 16.2 % — AB (ref 11.5–15.5)
WBC: 7.4 10*3/uL (ref 4.0–10.5)

## 2016-02-02 MED ORDER — BISACODYL 10 MG RE SUPP: 10.0000 mg | RECTAL | Status: DC | PRN

## 2016-02-02 MED ORDER — PANTOPRAZOLE SODIUM 40 MG PO TBEC: 40.0000 mg | DELAYED_RELEASE_TABLET | Freq: Every day | ORAL | Status: DC

## 2016-02-02 MED ORDER — POLYETHYLENE GLYCOL 3350 17 G PO PACK: 17.0000 g | PACK | Freq: Every day | ORAL | Status: DC | PRN

## 2016-02-02 MED ORDER — BISACODYL 5 MG PO TBEC: 5.0000 mg | DELAYED_RELEASE_TABLET | Freq: Every day | ORAL | Status: DC | PRN

## 2016-02-02 MED ORDER — FERROUS GLUCONATE 324 (38 FE) MG PO TABS: 324.0000 mg | ORAL_TABLET | Freq: Two times a day (BID) | ORAL | Status: DC

## 2016-02-02 NOTE — Progress Notes (Signed)
Spoke with Dr Wynelle Cleveland advising about new order for cbc. Dr Wynelle Cleveland states okay to discharge pt prior to cbc resulting. Order re-verbalized and confirmed

## 2016-02-02 NOTE — Progress Notes (Signed)
Pt discharged to home with dc instructions. Escorted to lobby via wheelchair by nursing tech; accompanied by daughter who will drive patient to home

## 2016-02-02 NOTE — Discharge Summary (Signed)
Physician Discharge Summary  Brenda Kerr [id number] DOB: [birth year] DOA: 01/30/2016  PCP: Elyn Peers, MD  Admit date: 01/30/2016 Discharge date: 02/02/2016  Time spent: 60 minutes  Recommendations for Outpatient Follow-up:  1. Hb in 1 month  Discharge Condition: stable    Discharge Diagnoses:  Principal Problem:   Esophageal ulcer with bleeding Active Problems:   Acute blood loss anemia   Cellulitis of right leg   Physical deconditioning   Falls   Venous stasis ulcers of both lower extremities   Diastolic CHF (HCC)   Essential hypertension   History of present illness:  Bren[place] 46[place]year[place] with a past medical history of hypertension diabetes mellitus, admitted this morning for right lower extremity cellulitis. She reports having recurrent falls over the last several months, having her last fall about 2-1/2 weeks ago. She stated injuring her right shin and noticed worsening erythema and pain to the area several days later. Symptoms have progressively worsened for which she presented to the emergency department overnight. She was found to have cellulitis and started on antibiotic therapy. She was dischared on Vantin and returned the same night after vomiting coffee ground material at home. Underwent EGD which showed an esophageal ulcer with adherent clot.   Hospital Course:  Hematemesis- esophageal ulcer - admits to a h/o heartburn and reflux - discussed need to stop all NSAIDs including daily aspirin and avoid Doxycycline and alcohol - no further bleeding in > 24 hrs.  - BID PPI x 1 month and then daily - f/u with GI in 1 mo or sooner if any issues  Acute blood loss anemia - transfused 1 U PRBC Hb7.4 >> 8.3>>7.9 - start oral Iron replacement - recheck Hb in 1 month   Right lower extremity cellulitis -Mrs. Gerner reporting having a fall about 2 and half weeks ago that resulted in an injury to her right shin. -Since then she has noted increasing  erythema, pain, swelling involving her right lower extremity. -Symptoms appear consistent with cellulitis- Has improved with Ceftriaxone - ceftriaxonechanged to Ceftin  - she's noted significant improvement in pain and swelling  Grade 1 Diastolic dysfunction/ pedal edema - becoming dehydrated in attempt to diurese off pedal edema- despite this continues to have pedal edema- stop Aldactone- - started HCTZ instead   Chronic venous stasis - mechanical measures for pedal edema- has resolved in the hospital with elevation despite receiving IVF - TEDS- elevate as much as possible- she admits that she has been inactive lately and has been sitting in a chair with feet in dependent position  Recurrent falls -She reports having recurrent falls over the past several months, most recently resulting in injury to her right leg complicated by infection. -X-ray of right tibia/fibula show diffuse subcutaneous edema without evidence of fracture -She feels that increase in edema involving lower extremities have contributed to her falls and feels that she has gained weight. She has so reports having exertional dyspnea. -Physical therapy consulted- HHPT ordered  Exertional dyspnea. -She reported having exertional dyspnea along with increasing bilateral extremity pitting edema and feels that this has contributed to her falls. - BNP checked by Dr Coralyn Pear- normal - ECHO does not reveal and abormalities - likely decondioned- pulse ox on ambulation is 100%- discussed to increase activity which she plans to do- PT ordered   Hypertension. -Patient's blood pressure is elevated with systolic blood pressures [id number] to 160s. -She is on amlodipine 10 mg by mouth daily  - Dr Coralyn Pear added  hydrochlorothiazide 25 mg by mouth daily - stop aldactone to prevent dehydration    Procedures:  EGD  (i.e. Studies not automatically included, echos, thoracentesis, etc; not x-rays)  Consultations:  GI  Discharge  Exam: Filed Weights   01/31/16 1240 02/02/16 0553  Weight: 93.5 kg (206 lb 2.1 oz) 84.959 kg (187 lb 4.8 oz)   Filed Vitals:   02/01/16 2144 02/02/16 0553  BP: 125/52 142/62  Pulse: 87 85  Temp: 98.2 F (36.8 C) 97.9 F (36.6 C)  Resp: 16 14    General: AAO x 3, no distress Cardiovascular: RRR, no murmurs  Respiratory: clear to auscultation bilaterally GI: soft, non-tender, non-distended, bowel sound positive  Discharge Instructions You were cared for by a hospitalist during your hospital stay. If you have any questions about your discharge medications or the care you received while you were in the hospital after you are discharged, you can call the unit and asked to speak with the hospitalist on call if the hospitalist that took care of you is not available. Once you are discharged, your primary care physician will handle any further medical issues. Please note that NO REFILLS for any discharge medications will be authorized once you are discharged, as it is imperative that you return to your primary care physician (or establish a relationship with a primary care physician if you do not have one) for your aftercare needs so that they can reassess your need for medications and monitor your lab values.      Discharge Instructions    Diet - low sodium heart healthy    Complete by:  As directed      Discharge instructions    Complete by:  As directed   Stop Ceftin after the evening dose on 6/7.  Do not take Aspirin, Ibuprofen, Alleve Keep TEDS on all day long to control swelling Keep feet up at level of heart when sitting on a chair Do not eat salt     Increase activity slowly    Complete by:  As directed             Medication List    STOP taking these medications        aspirin EC 81 MG tablet     spironolactone 25 MG tablet  Commonly known as:  ALDACTONE      TAKE these medications        acetaminophen 500 MG tablet  Commonly known as:  TYLENOL  Take 500 mg by  mouth every 6 (six) hours as needed for mild pain.     amLODipine 10 MG tablet  Commonly known as:  NORVASC  Take 10 mg by mouth daily.     bimatoprost 0.01 % Soln  Commonly known as:  LUMIGAN  Place 1 drop into both eyes at bedtime.     bisacodyl 5 MG EC tablet  Commonly known as:  bisacodyl  Take 1 tablet (5 mg total) by mouth daily as needed for moderate constipation.     bisacodyl 10 MG suppository  Commonly known as:  DULCOLAX  Place 1 suppository (10 mg total) rectally as needed for moderate constipation.     brimonidine-timolol 0.2-0.5 % ophthalmic solution  Commonly known as:  COMBIGAN  Place 1 drop into both eyes every 12 (twelve) hours.     cefUROXime 500 MG tablet  Commonly known as:  CEFTIN  Take 1 tablet (500 mg total) by mouth 2 (two) times daily with a meal.  cilostazol 100 MG tablet  Commonly known as:  PLETAL  Take 100 mg by mouth 2 (two) times daily.     dorzolamide 2 % ophthalmic solution  Commonly known as:  TRUSOPT  Place 1 drop into both eyes 2 (two) times daily.     ferrous gluconate 324 MG tablet  Commonly known as:  FERGON  Take 1 tablet (324 mg total) by mouth 2 (two) times daily with a meal.     hydrochlorothiazide 25 MG tablet  Commonly known as:  HYDRODIURIL  Take 1 tablet (25 mg total) by mouth daily.     pantoprazole 40 MG tablet  Commonly known as:  PROTONIX  Take 1 tablet (40 mg total) by mouth daily. Switch for any other PPI at similar dose and frequency     polyethylene glycol packet  Commonly known as:  MIRALAX / GLYCOLAX  Take 17 g by mouth daily as needed for mild constipation.     T.E.D. BELOW KNEE/XL Misc  1 each by Does not apply route daily.       Allergies  Allergen Reactions  . Codeine Nausea And Vomiting   Follow-up Information    Follow up with Elyn Peers, MD In 1 week.   Specialty:  Family Medicine   Contact information:   [address] [place] [address] [phone]       Follow up with  Landry Dyke, MD.   Specialty:  Gastroenterology   Why:  In 1 month or ealier if needed   Contact information:   [address] [place] [address] [phone]        The results of significant diagnostics from this hospitalization (including imaging, microbiology, ancillary and laboratory) are listed below for reference.    Significant Diagnostic Studies: Dg Chest 2 View  01/27/2016  CLINICAL DATA:  Shortness of breath for 24 hours.  Fall 3 weeks ago. EXAM: CHEST  2 VIEW COMPARISON:  09/25/2015 FINDINGS: The patient is rotated to the left on today's radiograph, reducing diagnostic sensitivity and specificity. Atherosclerotic aortic arch. Thoracic spondylosis. The lungs appear clear. Accounting for the rotation and projection, heart size is within normal limits. IMPRESSION: 1. Atherosclerosis. 2. Thoracic spondylosis. 3. No acute findings. Electronically Signed   By: Van Clines M.D.   On: 01/27/2016 23:02   Dg Tibia/fibula Right  01/27/2016  CLINICAL DATA:  Laceration on the right lower leg, concern for infection. EXAM: RIGHT TIBIA AND FIBULA - 2 VIEW COMPARISON:  09/22/2015 FINDINGS: There is diffuse mild subcutaneous edema along the lower leg. Severe osteoarthritis of the knee. Vascular calcifications noted. No gas tracking in the soft tissues. No findings of osteomyelitis of the tibia or fibula. I do not see a definite foreign body. Mild bony demineralization. Plantar and Achilles calcaneal spurs. IMPRESSION: 1. Mild diffuse subcutaneous edema in the calf. No foreign body or gas in the soft tissues identified. 2. Severe osteoarthritis of the knee. 3. Bony demineralization. Electronically Signed   By: Van Clines M.D.   On: 01/27/2016 23:03    Microbiology: No results found for this or any previous visit (from the past 240 hour(s)).   Labs: Basic Metabolic Panel:  Recent Labs Lab 01/27/16 2222 01/28/16 0453 01/30/16 2030 01/31/16 0508  NA 133*  139 133* 134*  K 3.9 4.1 4.3 3.7  CL 101 108 99* 100*  CO2 25 25 25 26   GLUCOSE 115* 118* 142* 111*  BUN 22* 18 33* 30*  CREATININE 1.00  0.72 0.99 0.91  CALCIUM 8.8* 8.6* 9.0 8.6*   Liver Function Tests:  Recent Labs Lab 01/27/16 2222 01/30/16 2030  AST 20 18  ALT 16 15  ALKPHOS 85 72  BILITOT 0.6 0.4  PROT 7.2 6.8  ALBUMIN 3.3* 3.1*   No results for input(s): LIPASE, AMYLASE in the last 168 hours. No results for input(s): AMMONIA in the last 168 hours. CBC:  Recent Labs Lab 01/27/16 2222 01/28/16 0453 01/30/16 2030 01/31/16 0508 01/31/16 2031 02/02/16 0939  WBC 11.3* 10.5 11.2* 10.3 10.3 7.4  NEUTROABS 8.3*  --   --   --   --   --   HGB 10.3* 9.4* 8.8* 7.4* 8.3* 7.9*  HCT 31.6* 29.1* 26.9* 22.5* 25.0* 24.5*  MCV 81.2 81.1 79.1 80.6 81.7 81.1  PLT 314 270 317 279 276 291   Cardiac Enzymes: No results for input(s): CKTOTAL, CKMB, CKMBINDEX, TROPONINI in the last 168 hours. BNP: BNP (last 3 results)  Recent Labs  09/25/15 1837 01/27/16 2222 01/28/16 0453  BNP 34.2 38.3 25.6    ProBNP (last 3 results) No results for input(s): PROBNP in the last 8760 hours.  CBG:  Recent Labs Lab 01/31/16 0236 01/31/16 0746  GLUCAP 135* 120*       SignedDebbe Odea, MD [facility] 02/02/2016, 10:51 AM

## 2016-02-03 LAB — TYPE AND SCREEN
ABO/RH(D): O POS
Antibody Screen: NEGATIVE
Unit division: 0

## 2016-02-04 ENCOUNTER — Encounter (HOSPITAL_COMMUNITY): Payer: Self-pay | Admitting: Gastroenterology

## 2016-02-13 DIAGNOSIS — F064 Anxiety disorder due to known physiological condition: Secondary | ICD-10-CM | POA: Diagnosis not present

## 2016-02-13 DIAGNOSIS — S81811A Laceration without foreign body, right lower leg, initial encounter: Secondary | ICD-10-CM | POA: Diagnosis not present

## 2016-02-13 DIAGNOSIS — R7309 Other abnormal glucose: Secondary | ICD-10-CM | POA: Diagnosis not present

## 2016-02-17 DIAGNOSIS — Z9181 History of falling: Secondary | ICD-10-CM | POA: Diagnosis not present

## 2016-02-17 DIAGNOSIS — L03115 Cellulitis of right lower limb: Secondary | ICD-10-CM | POA: Diagnosis not present

## 2016-02-17 DIAGNOSIS — I10 Essential (primary) hypertension: Secondary | ICD-10-CM | POA: Diagnosis not present

## 2016-02-17 DIAGNOSIS — E119 Type 2 diabetes mellitus without complications: Secondary | ICD-10-CM | POA: Diagnosis not present

## 2016-02-20 DIAGNOSIS — L03115 Cellulitis of right lower limb: Secondary | ICD-10-CM | POA: Diagnosis not present

## 2016-02-20 DIAGNOSIS — E119 Type 2 diabetes mellitus without complications: Secondary | ICD-10-CM | POA: Diagnosis not present

## 2016-02-20 DIAGNOSIS — I10 Essential (primary) hypertension: Secondary | ICD-10-CM | POA: Diagnosis not present

## 2016-02-20 DIAGNOSIS — Z9181 History of falling: Secondary | ICD-10-CM | POA: Diagnosis not present

## 2016-02-24 DIAGNOSIS — I10 Essential (primary) hypertension: Secondary | ICD-10-CM | POA: Diagnosis not present

## 2016-02-24 DIAGNOSIS — Z9181 History of falling: Secondary | ICD-10-CM | POA: Diagnosis not present

## 2016-02-24 DIAGNOSIS — E119 Type 2 diabetes mellitus without complications: Secondary | ICD-10-CM | POA: Diagnosis not present

## 2016-02-24 DIAGNOSIS — L03115 Cellulitis of right lower limb: Secondary | ICD-10-CM | POA: Diagnosis not present

## 2016-02-26 DIAGNOSIS — E119 Type 2 diabetes mellitus without complications: Secondary | ICD-10-CM | POA: Diagnosis not present

## 2016-02-26 DIAGNOSIS — I10 Essential (primary) hypertension: Secondary | ICD-10-CM | POA: Diagnosis not present

## 2016-02-26 DIAGNOSIS — L03115 Cellulitis of right lower limb: Secondary | ICD-10-CM | POA: Diagnosis not present

## 2016-02-26 DIAGNOSIS — Z9181 History of falling: Secondary | ICD-10-CM | POA: Diagnosis not present

## 2016-03-03 DIAGNOSIS — L03115 Cellulitis of right lower limb: Secondary | ICD-10-CM | POA: Diagnosis not present

## 2016-03-03 DIAGNOSIS — Z9181 History of falling: Secondary | ICD-10-CM | POA: Diagnosis not present

## 2016-03-03 DIAGNOSIS — E119 Type 2 diabetes mellitus without complications: Secondary | ICD-10-CM | POA: Diagnosis not present

## 2016-03-03 DIAGNOSIS — I10 Essential (primary) hypertension: Secondary | ICD-10-CM | POA: Diagnosis not present

## 2016-03-05 DIAGNOSIS — Z6831 Body mass index (BMI) 31.0-31.9, adult: Secondary | ICD-10-CM | POA: Diagnosis not present

## 2016-03-05 DIAGNOSIS — S81812D Laceration without foreign body, left lower leg, subsequent encounter: Secondary | ICD-10-CM | POA: Diagnosis not present

## 2016-03-05 DIAGNOSIS — I10 Essential (primary) hypertension: Secondary | ICD-10-CM | POA: Diagnosis not present

## 2016-03-05 DIAGNOSIS — Z79899 Other long term (current) drug therapy: Secondary | ICD-10-CM | POA: Diagnosis not present

## 2016-03-06 DIAGNOSIS — E119 Type 2 diabetes mellitus without complications: Secondary | ICD-10-CM | POA: Diagnosis not present

## 2016-03-06 DIAGNOSIS — L03115 Cellulitis of right lower limb: Secondary | ICD-10-CM | POA: Diagnosis not present

## 2016-03-06 DIAGNOSIS — I10 Essential (primary) hypertension: Secondary | ICD-10-CM | POA: Diagnosis not present

## 2016-03-06 DIAGNOSIS — Z9181 History of falling: Secondary | ICD-10-CM | POA: Diagnosis not present

## 2016-03-09 DIAGNOSIS — L03115 Cellulitis of right lower limb: Secondary | ICD-10-CM | POA: Diagnosis not present

## 2016-03-09 DIAGNOSIS — E119 Type 2 diabetes mellitus without complications: Secondary | ICD-10-CM | POA: Diagnosis not present

## 2016-03-09 DIAGNOSIS — Z9181 History of falling: Secondary | ICD-10-CM | POA: Diagnosis not present

## 2016-03-09 DIAGNOSIS — I10 Essential (primary) hypertension: Secondary | ICD-10-CM | POA: Diagnosis not present

## 2016-03-10 DIAGNOSIS — L03115 Cellulitis of right lower limb: Secondary | ICD-10-CM | POA: Diagnosis not present

## 2016-03-10 DIAGNOSIS — I10 Essential (primary) hypertension: Secondary | ICD-10-CM | POA: Diagnosis not present

## 2016-03-10 DIAGNOSIS — E119 Type 2 diabetes mellitus without complications: Secondary | ICD-10-CM | POA: Diagnosis not present

## 2016-03-10 DIAGNOSIS — Z9181 History of falling: Secondary | ICD-10-CM | POA: Diagnosis not present

## 2016-03-11 DIAGNOSIS — L03115 Cellulitis of right lower limb: Secondary | ICD-10-CM | POA: Diagnosis not present

## 2016-03-11 DIAGNOSIS — Z9181 History of falling: Secondary | ICD-10-CM | POA: Diagnosis not present

## 2016-03-11 DIAGNOSIS — I10 Essential (primary) hypertension: Secondary | ICD-10-CM | POA: Diagnosis not present

## 2016-03-11 DIAGNOSIS — E119 Type 2 diabetes mellitus without complications: Secondary | ICD-10-CM | POA: Diagnosis not present

## 2016-03-18 DIAGNOSIS — L03115 Cellulitis of right lower limb: Secondary | ICD-10-CM | POA: Diagnosis not present

## 2016-03-18 DIAGNOSIS — I10 Essential (primary) hypertension: Secondary | ICD-10-CM | POA: Diagnosis not present

## 2016-03-18 DIAGNOSIS — E119 Type 2 diabetes mellitus without complications: Secondary | ICD-10-CM | POA: Diagnosis not present

## 2016-03-18 DIAGNOSIS — Z9181 History of falling: Secondary | ICD-10-CM | POA: Diagnosis not present

## 2016-03-23 DIAGNOSIS — I10 Essential (primary) hypertension: Secondary | ICD-10-CM | POA: Diagnosis not present

## 2016-03-23 DIAGNOSIS — E119 Type 2 diabetes mellitus without complications: Secondary | ICD-10-CM | POA: Diagnosis not present

## 2016-03-23 DIAGNOSIS — L03115 Cellulitis of right lower limb: Secondary | ICD-10-CM | POA: Diagnosis not present

## 2016-03-23 DIAGNOSIS — Z9181 History of falling: Secondary | ICD-10-CM | POA: Diagnosis not present

## 2016-03-27 DIAGNOSIS — E119 Type 2 diabetes mellitus without complications: Secondary | ICD-10-CM | POA: Diagnosis not present

## 2016-03-27 DIAGNOSIS — I10 Essential (primary) hypertension: Secondary | ICD-10-CM | POA: Diagnosis not present

## 2016-03-27 DIAGNOSIS — R600 Localized edema: Secondary | ICD-10-CM | POA: Diagnosis not present

## 2016-03-27 DIAGNOSIS — D638 Anemia in other chronic diseases classified elsewhere: Secondary | ICD-10-CM | POA: Diagnosis not present

## 2016-03-27 DIAGNOSIS — L97911 Non-pressure chronic ulcer of unspecified part of right lower leg limited to breakdown of skin: Secondary | ICD-10-CM | POA: Diagnosis not present

## 2016-03-27 DIAGNOSIS — L03115 Cellulitis of right lower limb: Secondary | ICD-10-CM | POA: Diagnosis not present

## 2016-03-27 DIAGNOSIS — Z9181 History of falling: Secondary | ICD-10-CM | POA: Diagnosis not present

## 2016-04-02 DIAGNOSIS — L03115 Cellulitis of right lower limb: Secondary | ICD-10-CM | POA: Diagnosis not present

## 2016-04-02 DIAGNOSIS — Z9181 History of falling: Secondary | ICD-10-CM | POA: Diagnosis not present

## 2016-04-02 DIAGNOSIS — I10 Essential (primary) hypertension: Secondary | ICD-10-CM | POA: Diagnosis not present

## 2016-04-02 DIAGNOSIS — E119 Type 2 diabetes mellitus without complications: Secondary | ICD-10-CM | POA: Diagnosis not present

## 2016-04-08 DIAGNOSIS — E119 Type 2 diabetes mellitus without complications: Secondary | ICD-10-CM | POA: Diagnosis not present

## 2016-04-08 DIAGNOSIS — I10 Essential (primary) hypertension: Secondary | ICD-10-CM | POA: Diagnosis not present

## 2016-04-08 DIAGNOSIS — Z9181 History of falling: Secondary | ICD-10-CM | POA: Diagnosis not present

## 2016-04-08 DIAGNOSIS — L03115 Cellulitis of right lower limb: Secondary | ICD-10-CM | POA: Diagnosis not present

## 2016-04-13 DIAGNOSIS — Z9181 History of falling: Secondary | ICD-10-CM | POA: Diagnosis not present

## 2016-04-13 DIAGNOSIS — E119 Type 2 diabetes mellitus without complications: Secondary | ICD-10-CM | POA: Diagnosis not present

## 2016-04-13 DIAGNOSIS — I10 Essential (primary) hypertension: Secondary | ICD-10-CM | POA: Diagnosis not present

## 2016-04-13 DIAGNOSIS — L03115 Cellulitis of right lower limb: Secondary | ICD-10-CM | POA: Diagnosis not present

## 2016-04-15 DIAGNOSIS — E119 Type 2 diabetes mellitus without complications: Secondary | ICD-10-CM | POA: Diagnosis not present

## 2016-04-15 DIAGNOSIS — I10 Essential (primary) hypertension: Secondary | ICD-10-CM | POA: Diagnosis not present

## 2016-04-15 DIAGNOSIS — Z9181 History of falling: Secondary | ICD-10-CM | POA: Diagnosis not present

## 2016-04-15 DIAGNOSIS — L03115 Cellulitis of right lower limb: Secondary | ICD-10-CM | POA: Diagnosis not present

## 2016-04-17 DIAGNOSIS — L03115 Cellulitis of right lower limb: Secondary | ICD-10-CM | POA: Diagnosis not present

## 2016-04-17 DIAGNOSIS — Z4800 Encounter for change or removal of nonsurgical wound dressing: Secondary | ICD-10-CM | POA: Diagnosis not present

## 2016-04-17 DIAGNOSIS — Z9181 History of falling: Secondary | ICD-10-CM | POA: Diagnosis not present

## 2016-04-17 DIAGNOSIS — E119 Type 2 diabetes mellitus without complications: Secondary | ICD-10-CM | POA: Diagnosis not present

## 2016-04-17 DIAGNOSIS — I10 Essential (primary) hypertension: Secondary | ICD-10-CM | POA: Diagnosis not present

## 2016-04-22 DIAGNOSIS — Z9181 History of falling: Secondary | ICD-10-CM | POA: Diagnosis not present

## 2016-04-22 DIAGNOSIS — L03115 Cellulitis of right lower limb: Secondary | ICD-10-CM | POA: Diagnosis not present

## 2016-04-22 DIAGNOSIS — Z4800 Encounter for change or removal of nonsurgical wound dressing: Secondary | ICD-10-CM | POA: Diagnosis not present

## 2016-04-22 DIAGNOSIS — E119 Type 2 diabetes mellitus without complications: Secondary | ICD-10-CM | POA: Diagnosis not present

## 2016-04-22 DIAGNOSIS — I10 Essential (primary) hypertension: Secondary | ICD-10-CM | POA: Diagnosis not present

## 2016-04-27 DIAGNOSIS — L97911 Non-pressure chronic ulcer of unspecified part of right lower leg limited to breakdown of skin: Secondary | ICD-10-CM | POA: Diagnosis not present

## 2016-04-27 DIAGNOSIS — F064 Anxiety disorder due to known physiological condition: Secondary | ICD-10-CM | POA: Diagnosis not present

## 2016-04-27 DIAGNOSIS — F5101 Primary insomnia: Secondary | ICD-10-CM | POA: Diagnosis not present

## 2016-04-27 DIAGNOSIS — I10 Essential (primary) hypertension: Secondary | ICD-10-CM | POA: Diagnosis not present

## 2016-04-30 ENCOUNTER — Other Ambulatory Visit: Payer: Self-pay

## 2016-04-30 DIAGNOSIS — L03115 Cellulitis of right lower limb: Secondary | ICD-10-CM | POA: Diagnosis not present

## 2016-04-30 DIAGNOSIS — Z4800 Encounter for change or removal of nonsurgical wound dressing: Secondary | ICD-10-CM | POA: Diagnosis not present

## 2016-04-30 DIAGNOSIS — E119 Type 2 diabetes mellitus without complications: Secondary | ICD-10-CM | POA: Diagnosis not present

## 2016-04-30 DIAGNOSIS — Z9181 History of falling: Secondary | ICD-10-CM | POA: Diagnosis not present

## 2016-04-30 DIAGNOSIS — I10 Essential (primary) hypertension: Secondary | ICD-10-CM | POA: Diagnosis not present

## 2016-05-12 DIAGNOSIS — L03115 Cellulitis of right lower limb: Secondary | ICD-10-CM | POA: Diagnosis not present

## 2016-05-12 DIAGNOSIS — I10 Essential (primary) hypertension: Secondary | ICD-10-CM | POA: Diagnosis not present

## 2016-05-12 DIAGNOSIS — Z9181 History of falling: Secondary | ICD-10-CM | POA: Diagnosis not present

## 2016-05-12 DIAGNOSIS — E119 Type 2 diabetes mellitus without complications: Secondary | ICD-10-CM | POA: Diagnosis not present

## 2016-05-12 DIAGNOSIS — Z4800 Encounter for change or removal of nonsurgical wound dressing: Secondary | ICD-10-CM | POA: Diagnosis not present

## 2016-05-25 DIAGNOSIS — E119 Type 2 diabetes mellitus without complications: Secondary | ICD-10-CM | POA: Diagnosis not present

## 2016-05-25 DIAGNOSIS — Z9181 History of falling: Secondary | ICD-10-CM | POA: Diagnosis not present

## 2016-05-25 DIAGNOSIS — Z4800 Encounter for change or removal of nonsurgical wound dressing: Secondary | ICD-10-CM | POA: Diagnosis not present

## 2016-05-25 DIAGNOSIS — L03115 Cellulitis of right lower limb: Secondary | ICD-10-CM | POA: Diagnosis not present

## 2016-05-25 DIAGNOSIS — I10 Essential (primary) hypertension: Secondary | ICD-10-CM | POA: Diagnosis not present

## 2016-05-28 DIAGNOSIS — D638 Anemia in other chronic diseases classified elsewhere: Secondary | ICD-10-CM | POA: Diagnosis not present

## 2016-05-28 DIAGNOSIS — F064 Anxiety disorder due to known physiological condition: Secondary | ICD-10-CM | POA: Diagnosis not present

## 2016-05-28 DIAGNOSIS — S81812D Laceration without foreign body, left lower leg, subsequent encounter: Secondary | ICD-10-CM | POA: Diagnosis not present

## 2016-05-28 DIAGNOSIS — R7309 Other abnormal glucose: Secondary | ICD-10-CM | POA: Diagnosis not present

## 2016-06-22 DIAGNOSIS — H401133 Primary open-angle glaucoma, bilateral, severe stage: Secondary | ICD-10-CM | POA: Diagnosis not present

## 2016-07-15 DIAGNOSIS — D638 Anemia in other chronic diseases classified elsewhere: Secondary | ICD-10-CM | POA: Diagnosis not present

## 2016-07-15 DIAGNOSIS — I10 Essential (primary) hypertension: Secondary | ICD-10-CM | POA: Diagnosis not present

## 2016-07-15 DIAGNOSIS — L97911 Non-pressure chronic ulcer of unspecified part of right lower leg limited to breakdown of skin: Secondary | ICD-10-CM | POA: Diagnosis not present

## 2016-07-15 DIAGNOSIS — M1990 Unspecified osteoarthritis, unspecified site: Secondary | ICD-10-CM | POA: Diagnosis not present

## 2016-07-15 DIAGNOSIS — M10241 Drug-induced gout, right hand: Secondary | ICD-10-CM | POA: Diagnosis not present

## 2016-07-16 DIAGNOSIS — M10271 Drug-induced gout, right ankle and foot: Secondary | ICD-10-CM | POA: Diagnosis not present

## 2016-07-16 DIAGNOSIS — I10 Essential (primary) hypertension: Secondary | ICD-10-CM | POA: Diagnosis not present

## 2016-07-16 DIAGNOSIS — L97911 Non-pressure chronic ulcer of unspecified part of right lower leg limited to breakdown of skin: Secondary | ICD-10-CM | POA: Diagnosis not present

## 2016-07-16 DIAGNOSIS — M1990 Unspecified osteoarthritis, unspecified site: Secondary | ICD-10-CM | POA: Diagnosis not present

## 2016-08-20 DIAGNOSIS — I10 Essential (primary) hypertension: Secondary | ICD-10-CM | POA: Diagnosis not present

## 2016-08-20 DIAGNOSIS — F064 Anxiety disorder due to known physiological condition: Secondary | ICD-10-CM | POA: Diagnosis not present

## 2016-08-20 DIAGNOSIS — D638 Anemia in other chronic diseases classified elsewhere: Secondary | ICD-10-CM | POA: Diagnosis not present

## 2016-08-20 DIAGNOSIS — M1990 Unspecified osteoarthritis, unspecified site: Secondary | ICD-10-CM | POA: Diagnosis not present

## 2016-10-09 DIAGNOSIS — I10 Essential (primary) hypertension: Secondary | ICD-10-CM | POA: Diagnosis not present

## 2016-10-09 DIAGNOSIS — J1089 Influenza due to other identified influenza virus with other manifestations: Secondary | ICD-10-CM | POA: Diagnosis not present

## 2016-10-22 DIAGNOSIS — H401133 Primary open-angle glaucoma, bilateral, severe stage: Secondary | ICD-10-CM | POA: Diagnosis not present

## 2016-10-30 DIAGNOSIS — H401133 Primary open-angle glaucoma, bilateral, severe stage: Secondary | ICD-10-CM | POA: Diagnosis not present

## 2016-11-12 DIAGNOSIS — I10 Essential (primary) hypertension: Secondary | ICD-10-CM | POA: Diagnosis not present

## 2016-11-12 DIAGNOSIS — M1990 Unspecified osteoarthritis, unspecified site: Secondary | ICD-10-CM | POA: Diagnosis not present

## 2016-12-10 DIAGNOSIS — N3941 Urge incontinence: Secondary | ICD-10-CM | POA: Diagnosis not present

## 2016-12-10 DIAGNOSIS — M25561 Pain in right knee: Secondary | ICD-10-CM | POA: Diagnosis not present

## 2016-12-10 DIAGNOSIS — I10 Essential (primary) hypertension: Secondary | ICD-10-CM | POA: Diagnosis not present

## 2016-12-10 DIAGNOSIS — M1990 Unspecified osteoarthritis, unspecified site: Secondary | ICD-10-CM | POA: Diagnosis not present

## 2016-12-10 DIAGNOSIS — M25569 Pain in unspecified knee: Secondary | ICD-10-CM | POA: Diagnosis not present

## 2016-12-10 DIAGNOSIS — R1030 Lower abdominal pain, unspecified: Secondary | ICD-10-CM | POA: Diagnosis not present

## 2016-12-17 DIAGNOSIS — I10 Essential (primary) hypertension: Secondary | ICD-10-CM | POA: Diagnosis not present

## 2016-12-17 DIAGNOSIS — N390 Urinary tract infection, site not specified: Secondary | ICD-10-CM | POA: Diagnosis not present

## 2016-12-17 DIAGNOSIS — R7309 Other abnormal glucose: Secondary | ICD-10-CM | POA: Diagnosis not present

## 2017-03-25 DIAGNOSIS — M1990 Unspecified osteoarthritis, unspecified site: Secondary | ICD-10-CM | POA: Diagnosis not present

## 2017-03-25 DIAGNOSIS — I10 Essential (primary) hypertension: Secondary | ICD-10-CM | POA: Diagnosis not present

## 2017-04-01 DIAGNOSIS — H401133 Primary open-angle glaucoma, bilateral, severe stage: Secondary | ICD-10-CM | POA: Diagnosis not present

## 2017-04-21 DIAGNOSIS — Z0000 Encounter for general adult medical examination without abnormal findings: Secondary | ICD-10-CM | POA: Diagnosis not present

## 2017-04-23 DIAGNOSIS — M25561 Pain in right knee: Secondary | ICD-10-CM | POA: Diagnosis not present

## 2017-05-17 DIAGNOSIS — I10 Essential (primary) hypertension: Secondary | ICD-10-CM | POA: Diagnosis not present

## 2017-05-17 DIAGNOSIS — J682 Upper respiratory inflammation due to chemicals, gases, fumes and vapors, not elsewhere classified: Secondary | ICD-10-CM | POA: Diagnosis not present

## 2017-05-17 DIAGNOSIS — F064 Anxiety disorder due to known physiological condition: Secondary | ICD-10-CM | POA: Diagnosis not present

## 2017-05-21 DIAGNOSIS — H401133 Primary open-angle glaucoma, bilateral, severe stage: Secondary | ICD-10-CM | POA: Diagnosis not present

## 2017-06-03 DIAGNOSIS — M170 Bilateral primary osteoarthritis of knee: Secondary | ICD-10-CM | POA: Diagnosis not present

## 2017-06-03 DIAGNOSIS — Z9181 History of falling: Secondary | ICD-10-CM | POA: Diagnosis not present

## 2017-06-03 DIAGNOSIS — H409 Unspecified glaucoma: Secondary | ICD-10-CM | POA: Diagnosis not present

## 2017-06-07 DIAGNOSIS — J069 Acute upper respiratory infection, unspecified: Secondary | ICD-10-CM | POA: Diagnosis not present

## 2017-06-09 DIAGNOSIS — M170 Bilateral primary osteoarthritis of knee: Secondary | ICD-10-CM | POA: Diagnosis not present

## 2017-06-09 DIAGNOSIS — Z9181 History of falling: Secondary | ICD-10-CM | POA: Diagnosis not present

## 2017-06-09 DIAGNOSIS — H409 Unspecified glaucoma: Secondary | ICD-10-CM | POA: Diagnosis not present

## 2017-06-18 DIAGNOSIS — H409 Unspecified glaucoma: Secondary | ICD-10-CM | POA: Diagnosis not present

## 2017-06-18 DIAGNOSIS — M170 Bilateral primary osteoarthritis of knee: Secondary | ICD-10-CM | POA: Diagnosis not present

## 2017-06-18 DIAGNOSIS — Z9181 History of falling: Secondary | ICD-10-CM | POA: Diagnosis not present

## 2017-06-22 DIAGNOSIS — H409 Unspecified glaucoma: Secondary | ICD-10-CM | POA: Diagnosis not present

## 2017-06-22 DIAGNOSIS — M170 Bilateral primary osteoarthritis of knee: Secondary | ICD-10-CM | POA: Diagnosis not present

## 2017-06-22 DIAGNOSIS — Z9181 History of falling: Secondary | ICD-10-CM | POA: Diagnosis not present

## 2017-06-30 DIAGNOSIS — Z9181 History of falling: Secondary | ICD-10-CM | POA: Diagnosis not present

## 2017-06-30 DIAGNOSIS — H409 Unspecified glaucoma: Secondary | ICD-10-CM | POA: Diagnosis not present

## 2017-06-30 DIAGNOSIS — M170 Bilateral primary osteoarthritis of knee: Secondary | ICD-10-CM | POA: Diagnosis not present

## 2017-07-05 DIAGNOSIS — M170 Bilateral primary osteoarthritis of knee: Secondary | ICD-10-CM | POA: Diagnosis not present

## 2017-07-05 DIAGNOSIS — Z9181 History of falling: Secondary | ICD-10-CM | POA: Diagnosis not present

## 2017-07-05 DIAGNOSIS — H409 Unspecified glaucoma: Secondary | ICD-10-CM | POA: Diagnosis not present

## 2017-07-07 DIAGNOSIS — M170 Bilateral primary osteoarthritis of knee: Secondary | ICD-10-CM | POA: Diagnosis not present

## 2017-07-07 DIAGNOSIS — H409 Unspecified glaucoma: Secondary | ICD-10-CM | POA: Diagnosis not present

## 2017-07-07 DIAGNOSIS — Z9181 History of falling: Secondary | ICD-10-CM | POA: Diagnosis not present

## 2017-07-08 DIAGNOSIS — H409 Unspecified glaucoma: Secondary | ICD-10-CM | POA: Diagnosis not present

## 2017-07-08 DIAGNOSIS — M170 Bilateral primary osteoarthritis of knee: Secondary | ICD-10-CM | POA: Diagnosis not present

## 2017-07-08 DIAGNOSIS — Z9181 History of falling: Secondary | ICD-10-CM | POA: Diagnosis not present

## 2017-08-02 DIAGNOSIS — M170 Bilateral primary osteoarthritis of knee: Secondary | ICD-10-CM | POA: Diagnosis not present

## 2017-09-10 DIAGNOSIS — M130 Polyarthritis, unspecified: Secondary | ICD-10-CM | POA: Diagnosis not present

## 2017-09-10 DIAGNOSIS — M10271 Drug-induced gout, right ankle and foot: Secondary | ICD-10-CM | POA: Diagnosis not present

## 2017-09-10 DIAGNOSIS — N3281 Overactive bladder: Secondary | ICD-10-CM | POA: Diagnosis not present

## 2017-09-10 DIAGNOSIS — R7303 Prediabetes: Secondary | ICD-10-CM | POA: Diagnosis not present

## 2017-09-10 DIAGNOSIS — I10 Essential (primary) hypertension: Secondary | ICD-10-CM | POA: Diagnosis not present

## 2017-11-25 ENCOUNTER — Other Ambulatory Visit: Payer: Self-pay

## 2017-11-25 ENCOUNTER — Encounter (HOSPITAL_COMMUNITY): Payer: Self-pay | Admitting: Emergency Medicine

## 2017-11-25 ENCOUNTER — Emergency Department (HOSPITAL_COMMUNITY)
Admission: EM | Admit: 2017-11-25 | Discharge: 2017-11-25 | Disposition: A | Discharge status: Home / Self Care | Payer: Medicare Other | Attending: Emergency Medicine | Admitting: Emergency Medicine

## 2017-11-25 ENCOUNTER — Emergency Department (HOSPITAL_COMMUNITY): Payer: Medicare Other

## 2017-11-25 DIAGNOSIS — Z96642 Presence of left artificial hip joint: Secondary | ICD-10-CM | POA: Diagnosis not present

## 2017-11-25 DIAGNOSIS — R42 Dizziness and giddiness: Secondary | ICD-10-CM

## 2017-11-25 DIAGNOSIS — E119 Type 2 diabetes mellitus without complications: Secondary | ICD-10-CM | POA: Diagnosis not present

## 2017-11-25 DIAGNOSIS — I110 Hypertensive heart disease with heart failure: Secondary | ICD-10-CM | POA: Insufficient documentation

## 2017-11-25 DIAGNOSIS — Z79899 Other long term (current) drug therapy: Secondary | ICD-10-CM | POA: Insufficient documentation

## 2017-11-25 DIAGNOSIS — I5031 Acute diastolic (congestive) heart failure: Secondary | ICD-10-CM | POA: Diagnosis not present

## 2017-11-25 LAB — COMPREHENSIVE METABOLIC PANEL
ALT: 15 U/L (ref 14–54)
AST: 21 U/L (ref 15–41)
Albumin: 4 g/dL (ref 3.5–5.0)
Alkaline Phosphatase: 110 U/L (ref 38–126)
Anion gap: 11 (ref 5–15)
BILIRUBIN TOTAL: 0.7 mg/dL (ref 0.3–1.2)
BUN: 23 mg/dL — AB (ref 6–20)
CO2: 23 mmol/L (ref 22–32)
CREATININE: 1.04 mg/dL — AB (ref 0.44–1.00)
Calcium: 9.7 mg/dL (ref 8.9–10.3)
Chloride: 106 mmol/L (ref 101–111)
GFR calc Af Amer: 59 mL/min — ABNORMAL LOW (ref >60)
GFR, EST NON AFRICAN AMERICAN: 50 mL/min — AB (ref >60)
Glucose, Bld: 93 mg/dL (ref 65–99)
Potassium: 4.1 mmol/L (ref 3.5–5.1)
Sodium: 140 mmol/L (ref 135–145)
TOTAL PROTEIN: 7.5 g/dL (ref 6.5–8.1)

## 2017-11-25 LAB — DIFFERENTIAL
BASOS ABS: 0 10*3/uL (ref 0.0–0.1)
BASOS PCT: 0 %
EOS ABS: 0.2 10*3/uL (ref 0.0–0.7)
Eosinophils Relative: 3 %
Lymphocytes Relative: 25 %
Lymphs Abs: 1.7 10*3/uL (ref 0.7–4.0)
MONO ABS: 0.7 10*3/uL (ref 0.1–1.0)
MONOS PCT: 10 %
NEUTROS ABS: 4.2 10*3/uL (ref 1.7–7.7)
Neutrophils Relative %: 62 %

## 2017-11-25 LAB — CBC
HEMATOCRIT: 40.1 % (ref 36.0–46.0)
HEMOGLOBIN: 12.6 g/dL (ref 12.0–15.0)
MCH: 27.6 pg (ref 26.0–34.0)
MCHC: 31.4 g/dL (ref 30.0–36.0)
MCV: 87.9 fL (ref 78.0–100.0)
Platelets: 232 10*3/uL (ref 150–400)
RBC: 4.56 MIL/uL (ref 3.87–5.11)
RDW: 14.8 % (ref 11.5–15.5)
WBC: 6.7 10*3/uL (ref 4.0–10.5)

## 2017-11-25 LAB — PROTIME-INR
INR: 1.01
Prothrombin Time: 13.2 seconds (ref 11.4–15.2)

## 2017-11-25 LAB — APTT: APTT: 34 s (ref 24–36)

## 2017-11-25 LAB — CBG MONITORING, ED: GLUCOSE-CAPILLARY: 95 mg/dL (ref 65–99)

## 2017-11-25 LAB — TROPONIN I

## 2017-11-25 MED ORDER — MECLIZINE HCL 12.5 MG PO TABS: 12.5000 mg | ORAL_TABLET | Freq: Two times a day (BID) | ORAL | 0 refills | Status: DC | PRN

## 2017-11-25 MED ORDER — MECLIZINE HCL 12.5 MG PO TABS
12.5000 mg | ORAL_TABLET | Freq: Once | ORAL | Status: AC
  Administered 2017-11-25: 12.5 mg via ORAL
  Filled 2017-11-25: qty 1

## 2017-11-25 NOTE — Discharge Instructions (Addendum)
Your evaluated in the emergency department for dizziness.  This symptom can be caused by many things but with the testing we did in the emergency department we did not find an obvious cause for it.  You had some improvement with the meclizine so I am going to send you home with a prescription for that.  This may be sedating so he should be careful of that.  Please call your doctor so they can schedule a follow-up for you.  If you have any worsening of your symptoms or new concerning symptoms please return to the emergency department.

## 2017-11-25 NOTE — ED Provider Notes (Signed)
[facility] EMERGENCY DEPARTMENT Provider Note   CSN: [id number] Arrival date & time: 11/25/17  1507     History   Chief Complaint Chief Complaint  Patient presents with  . Dizziness    HPI Brenda Kerr is a 77 y.o. female.  She is complaining of feeling dizzy since yesterday.  She alternates between clarifying it as lightheaded passing out and room spinning.  She states she has had these episodes before but never this severe.  It is not associated with any nausea vomiting or diarrhea.  She has had some diminished vision in her eyes left greater than right and has a known history of glaucoma.  She states she is supposed to see an eye doctor next week for this.  She denies any eye pain.  She denies any weakness or numbness in her extremities.  She denies any difficulty with her speech.  There is been no new medications.  No recent illnesses.  The history is provided by the patient.  Dizziness  Quality:  Lightheadedness, room spinning and head spinning Severity:  Moderate Onset quality:  Gradual Timing:  Intermittent Progression:  Worsening Chronicity:  New (unclear, states new then states has had before - but not this bad) Relieved by:  Nothing Worsened by:  Nothing Ineffective treatments:  None tried Associated symptoms: vision changes   Associated symptoms: no blood in stool, no chest pain, no diarrhea, no headaches, no hearing loss, no nausea, no palpitations, no shortness of breath, no syncope, no tinnitus and no vomiting   Risk factors: no heart disease, no hx of stroke and no new medications     Past Medical History:  Diagnosis Date  . Arthritis   . Diabetes mellitus without complication ([place])   . Dysrhythmia   . GERD (gastroesophageal reflux disease)   . Glaucoma   . H/O hiatal hernia   . Hypertension   . Peripheral vascular disease ([place])   . Shortness of breath    walk a long way    Patient Active Problem List   Diagnosis Date Noted  . Venous stasis ulcers  of both lower extremities ([place]) 02/02/2016  . Acute blood loss anemia 02/02/2016  . Esophageal ulcer with bleeding 02/01/2016  . Diastolic CHF ([place]) 53/97/6734  . Falls 01/31/2016  . Essential hypertension 01/31/2016  . Venous stasis 01/30/2016  . Physical deconditioning 01/30/2016  . Cellulitis of right leg 01/28/2016  . HTN (hypertension) 01/28/2016  . Abnormality of gait 04/19/2012  . Hip pain 04/19/2012  . Hip stiffness 04/19/2012    Past Surgical History:  Procedure Laterality Date  . ARTHROPLASTY  03/09/2012   lt hip  . ESOPHAGOGASTRODUODENOSCOPY Left 01/31/2016   Procedure: ESOPHAGOGASTRODUODENOSCOPY (EGD);  Surgeon: Arta Silence, MD;  Location: [facility];  Service: Endoscopy;  Laterality: Left;  . EYE SURGERY    . JOINT REPLACEMENT     hip replacement  . LEFT HEART CATHETERIZATION WITH CORONARY ANGIOGRAM N/A 05/29/2014   Procedure: LEFT HEART CATHETERIZATION WITH CORONARY ANGIOGRAM;  Surgeon: Laverda Page, MD;  Location: [facility] CATH LAB;  Service: Cardiovascular;  Laterality: N/A;  . TOTAL HIP ARTHROPLASTY  03/09/2012   Procedure: TOTAL HIP ARTHROPLASTY;  Surgeon: Sharmon Revere, MD;  Location: [facility];  Service: Orthopedics;  Laterality: Left;     OB History   None      Home Medications    Prior to Admission medications   Medication Sig Start Date End Date Taking? Authorizing Provider  acetaminophen (TYLENOL) 500 MG tablet Take  500 mg by mouth every 6 (six) hours as needed for mild pain.    [provider]  amLODipine (NORVASC) 10 MG tablet Take 10 mg by mouth daily.  12/21/15   [provider]  bimatoprost (LUMIGAN) 0.01 % SOLN Place 1 drop into both eyes at bedtime.      [provider]  bisacodyl (BISACODYL) 5 MG EC tablet Take 1 tablet (5 mg total) by mouth daily as needed for moderate constipation. 02/02/16   Debbe Odea, MD  bisacodyl (DULCOLAX) 10 MG suppository Place 1 suppository (10 mg total) rectally as needed for moderate  constipation. 02/02/16   Debbe Odea, MD  brimonidine-timolol (COMBIGAN) 0.2-0.5 % ophthalmic solution Place 1 drop into both eyes every 12 (twelve) hours.      [provider]  cefUROXime (CEFTIN) 500 MG tablet Take 1 tablet (500 mg total) by mouth 2 (two) times daily with a meal. 01/30/16   Debbe Odea, MD  cilostazol (PLETAL) 100 MG tablet Take 100 mg by mouth 2 (two) times daily.    [provider]  dorzolamide (TRUSOPT) 2 % ophthalmic solution Place 1 drop into both eyes 2 (two) times daily.    [provider]  Elastic Bandages & Supports (T.E.D. BELOW KNEE/XL) MISC 1 each by Does not apply route daily. 01/30/16   Debbe Odea, MD  ferrous gluconate (FERGON) 324 MG tablet Take 1 tablet (324 mg total) by mouth 2 (two) times daily with a meal. 02/02/16   Debbe Odea, MD  hydrochlorothiazide (HYDRODIURIL) 25 MG tablet Take 1 tablet (25 mg total) by mouth daily. 01/30/16   Debbe Odea, MD  pantoprazole (PROTONIX) 40 MG tablet Take 1 tablet (40 mg total) by mouth daily. Switch for any other PPI at similar dose and frequency 02/02/16   Debbe Odea, MD  polyethylene glycol (MIRALAX / GLYCOLAX) packet Take 17 g by mouth daily as needed for mild constipation. 02/02/16   Debbe Odea, MD    Family History History reviewed. No pertinent family history.  Social History Social History   Tobacco Use  . Smoking status: Never Smoker  . Smokeless tobacco: Never Used  Substance Use Topics  . Alcohol use: No  . Drug use: No     Allergies   Codeine   Review of Systems Review of Systems  Constitutional: Negative for chills and fever.  HENT: Negative for ear pain, hearing loss, sore throat and tinnitus.   Eyes: Negative for pain and visual disturbance.  Respiratory: Negative for cough and shortness of breath.   Cardiovascular: Negative for chest pain, palpitations and syncope.  Gastrointestinal: Negative for abdominal pain, blood in stool, diarrhea, nausea and vomiting.    Genitourinary: Negative for dysuria and hematuria.  Musculoskeletal: Negative for arthralgias and back pain.  Skin: Negative for color change and rash.  Neurological: Positive for dizziness. Negative for seizures, syncope and headaches.  All other systems reviewed and are negative.    Physical Exam Updated Vital Signs BP (!) 139/55 (BP Location: Left Arm)   Pulse (!) 57   Temp 98.5 F (36.9 C) (Oral)   Resp 18   Ht 5\' 2"  (1.575 m)   Wt 68.9 kg (152 lb)   SpO2 100%   BMI 27.80 kg/m   Physical Exam  Constitutional: She is oriented to person, place, and time. She appears well-developed and well-nourished. No distress.  HENT:  Head: Normocephalic and atraumatic.  Eyes: Pupils are equal, round, and reactive to light. Conjunctivae and EOM are normal. Right  eye exhibits no nystagmus. Left eye exhibits no nystagmus.  Neck: Trachea normal. Neck supple. Carotid bruit is not present.  Cardiovascular: Normal rate and regular rhythm.  No murmur heard. Pulmonary/Chest: Effort normal and breath sounds normal. No respiratory distress.  Abdominal: Soft. There is no tenderness.  Musculoskeletal: She exhibits no edema.  Neurological: She is alert and oriented to person, place, and time. She has normal strength. No cranial nerve deficit or sensory deficit. She displays a negative Romberg sign. Coordination normal. GCS eye subscore is 4. GCS verbal subscore is 5. GCS motor subscore is 6.  Skin: Skin is warm and dry.  Psychiatric: She has a normal mood and affect.  Nursing note and vitals reviewed.    ED Treatments / Results  Labs (all labs ordered are listed, but only abnormal results are displayed) Labs Reviewed  COMPREHENSIVE METABOLIC PANEL - Abnormal; Notable for the following components:      Result Value   BUN 23 (*)    Creatinine, Ser 1.04 (*)    GFR calc non Af Amer 50 (*)    GFR calc Af Amer 59 (*)    All other components within normal limits  PROTIME-INR  APTT  CBC   DIFFERENTIAL  TROPONIN I  CBG MONITORING, ED    EKG None  Radiology Ct Head Wo Contrast  Result Date: 11/25/2017 CLINICAL DATA:  77 year old female with intermittent dizziness for several weeks. Prolonged symptoms today. EXAM: CT HEAD WITHOUT CONTRAST TECHNIQUE: Contiguous axial images were obtained from the base of the skull through the vertex without intravenous contrast. COMPARISON:  Head CT without contrast 03/25/2010. FINDINGS: Brain: Chronic ventriculomegaly and perisylvian predominant cerebral volume loss appear mildly progressed since 2011. No midline shift, ventriculomegaly, mass effect, evidence of mass lesion, intracranial hemorrhage or evidence of cortically based acute infarction. Patchy bilateral periventricular, including left parietal lobe nonspecific white matter hypodensity is chronic and not significantly changed. Stable mild bilateral external capsule white matter hypodensity. No cortical encephalomalacia identified. No definite posterior fossa abnormality. Vascular: Calcified atherosclerosis at the skull base. No suspicious intracranial vascular hyperdensity. Skull: Stable and negative. Sinuses/Orbits: Stable and well pneumatized. Mild chronic left posterior ethmoid bubbly opacity and small left inferior frontal sinus osteoma (normal variant). Other: No acute orbit or scalp soft tissue findings. IMPRESSION: 1.  No acute intracranial abnormality. 2. Chronic cerebral volume loss with ventriculomegaly, and nonspecific cerebral white matter changes have mildly progressed since 2011. Electronically Signed   By: Genevie Ann M.D.   On: 11/25/2017 19:02    Procedures Procedures (including critical care time)  Medications Ordered in ED Medications  meclizine (ANTIVERT) tablet 12.5 mg (12.5 mg Oral Given 11/25/17 1922)     Initial Impression / Assessment and Plan / ED Course  I have reviewed the triage vital signs and the nursing notes.  Pertinent labs & imaging results that were  available during my care of the patient were reviewed by me and considered in my medical decision making (see chart for details).  Clinical Course as of Nov 28 1723  Thu Nov 25, 2017  1951 She feels much improved after the meclizine.  She is ambulated with a walker which is her baseline and feels she is back to her normal.  She would like to be discharged I think that is reasonable.   [MB]    Clinical Course User Index [MB] Hayden Rasmussen, MD     Final Clinical Impressions(s) / ED Diagnoses   Final diagnoses:  Dizziness  Vertigo  ED Discharge Orders        Ordered    meclizine (ANTIVERT) 12.5 MG tablet  2 times daily PRN     11/25/17 1953       Hayden Rasmussen, MD 11/27/17 1727

## 2017-11-25 NOTE — ED Triage Notes (Signed)
Pt reports intermittent dizziness for several weeks.  Today it lasted longer than usual (about 15 minutes) and she became concerned she needed to be seen.

## 2017-11-25 NOTE — ED Notes (Signed)
Ambulated pt with walker,pt has steady with walker

## 2017-12-02 DIAGNOSIS — H401133 Primary open-angle glaucoma, bilateral, severe stage: Secondary | ICD-10-CM | POA: Diagnosis not present

## 2017-12-09 DIAGNOSIS — H401133 Primary open-angle glaucoma, bilateral, severe stage: Secondary | ICD-10-CM | POA: Diagnosis not present

## 2017-12-20 DIAGNOSIS — H401133 Primary open-angle glaucoma, bilateral, severe stage: Secondary | ICD-10-CM | POA: Diagnosis not present

## 2017-12-20 DIAGNOSIS — H409 Unspecified glaucoma: Secondary | ICD-10-CM | POA: Diagnosis not present

## 2017-12-22 DIAGNOSIS — H401133 Primary open-angle glaucoma, bilateral, severe stage: Secondary | ICD-10-CM | POA: Diagnosis not present

## 2018-01-05 DIAGNOSIS — H401133 Primary open-angle glaucoma, bilateral, severe stage: Secondary | ICD-10-CM | POA: Diagnosis not present

## 2018-02-09 DIAGNOSIS — J301 Allergic rhinitis due to pollen: Secondary | ICD-10-CM | POA: Diagnosis not present

## 2018-02-09 DIAGNOSIS — R634 Abnormal weight loss: Secondary | ICD-10-CM | POA: Diagnosis not present

## 2018-02-09 DIAGNOSIS — M130 Polyarthritis, unspecified: Secondary | ICD-10-CM | POA: Diagnosis not present

## 2018-02-09 DIAGNOSIS — I10 Essential (primary) hypertension: Secondary | ICD-10-CM | POA: Diagnosis not present

## 2018-02-22 DIAGNOSIS — Z9181 History of falling: Secondary | ICD-10-CM | POA: Diagnosis not present

## 2018-02-22 DIAGNOSIS — M170 Bilateral primary osteoarthritis of knee: Secondary | ICD-10-CM | POA: Diagnosis not present

## 2018-02-22 DIAGNOSIS — H409 Unspecified glaucoma: Secondary | ICD-10-CM | POA: Diagnosis not present

## 2018-03-02 DIAGNOSIS — M170 Bilateral primary osteoarthritis of knee: Secondary | ICD-10-CM | POA: Diagnosis not present

## 2018-03-02 DIAGNOSIS — Z9181 History of falling: Secondary | ICD-10-CM | POA: Diagnosis not present

## 2018-03-02 DIAGNOSIS — H409 Unspecified glaucoma: Secondary | ICD-10-CM | POA: Diagnosis not present

## 2018-03-03 DIAGNOSIS — Z9181 History of falling: Secondary | ICD-10-CM | POA: Diagnosis not present

## 2018-03-03 DIAGNOSIS — M170 Bilateral primary osteoarthritis of knee: Secondary | ICD-10-CM | POA: Diagnosis not present

## 2018-03-03 DIAGNOSIS — H409 Unspecified glaucoma: Secondary | ICD-10-CM | POA: Diagnosis not present

## 2018-03-04 DIAGNOSIS — Z9181 History of falling: Secondary | ICD-10-CM | POA: Diagnosis not present

## 2018-03-04 DIAGNOSIS — M170 Bilateral primary osteoarthritis of knee: Secondary | ICD-10-CM | POA: Diagnosis not present

## 2018-03-04 DIAGNOSIS — H409 Unspecified glaucoma: Secondary | ICD-10-CM | POA: Diagnosis not present

## 2018-03-05 DIAGNOSIS — Z9181 History of falling: Secondary | ICD-10-CM | POA: Diagnosis not present

## 2018-03-05 DIAGNOSIS — M170 Bilateral primary osteoarthritis of knee: Secondary | ICD-10-CM | POA: Diagnosis not present

## 2018-03-05 DIAGNOSIS — H409 Unspecified glaucoma: Secondary | ICD-10-CM | POA: Diagnosis not present

## 2018-03-07 DIAGNOSIS — H409 Unspecified glaucoma: Secondary | ICD-10-CM | POA: Diagnosis not present

## 2018-03-07 DIAGNOSIS — Z9181 History of falling: Secondary | ICD-10-CM | POA: Diagnosis not present

## 2018-03-07 DIAGNOSIS — M170 Bilateral primary osteoarthritis of knee: Secondary | ICD-10-CM | POA: Diagnosis not present

## 2018-03-09 DIAGNOSIS — M170 Bilateral primary osteoarthritis of knee: Secondary | ICD-10-CM | POA: Diagnosis not present

## 2018-03-09 DIAGNOSIS — H409 Unspecified glaucoma: Secondary | ICD-10-CM | POA: Diagnosis not present

## 2018-03-09 DIAGNOSIS — Z9181 History of falling: Secondary | ICD-10-CM | POA: Diagnosis not present

## 2018-03-11 DIAGNOSIS — H409 Unspecified glaucoma: Secondary | ICD-10-CM | POA: Diagnosis not present

## 2018-03-11 DIAGNOSIS — Z9181 History of falling: Secondary | ICD-10-CM | POA: Diagnosis not present

## 2018-03-11 DIAGNOSIS — M170 Bilateral primary osteoarthritis of knee: Secondary | ICD-10-CM | POA: Diagnosis not present

## 2018-03-14 DIAGNOSIS — H409 Unspecified glaucoma: Secondary | ICD-10-CM | POA: Diagnosis not present

## 2018-03-14 DIAGNOSIS — Z9181 History of falling: Secondary | ICD-10-CM | POA: Diagnosis not present

## 2018-03-14 DIAGNOSIS — M170 Bilateral primary osteoarthritis of knee: Secondary | ICD-10-CM | POA: Diagnosis not present

## 2018-03-16 DIAGNOSIS — Z9181 History of falling: Secondary | ICD-10-CM | POA: Diagnosis not present

## 2018-03-16 DIAGNOSIS — M170 Bilateral primary osteoarthritis of knee: Secondary | ICD-10-CM | POA: Diagnosis not present

## 2018-03-16 DIAGNOSIS — H409 Unspecified glaucoma: Secondary | ICD-10-CM | POA: Diagnosis not present

## 2018-03-18 DIAGNOSIS — Z9181 History of falling: Secondary | ICD-10-CM | POA: Diagnosis not present

## 2018-03-18 DIAGNOSIS — H409 Unspecified glaucoma: Secondary | ICD-10-CM | POA: Diagnosis not present

## 2018-03-18 DIAGNOSIS — M170 Bilateral primary osteoarthritis of knee: Secondary | ICD-10-CM | POA: Diagnosis not present

## 2018-03-21 DIAGNOSIS — H409 Unspecified glaucoma: Secondary | ICD-10-CM | POA: Diagnosis not present

## 2018-03-21 DIAGNOSIS — M170 Bilateral primary osteoarthritis of knee: Secondary | ICD-10-CM | POA: Diagnosis not present

## 2018-03-21 DIAGNOSIS — Z9181 History of falling: Secondary | ICD-10-CM | POA: Diagnosis not present

## 2018-03-23 DIAGNOSIS — M170 Bilateral primary osteoarthritis of knee: Secondary | ICD-10-CM | POA: Diagnosis not present

## 2018-03-23 DIAGNOSIS — Z9181 History of falling: Secondary | ICD-10-CM | POA: Diagnosis not present

## 2018-03-23 DIAGNOSIS — H409 Unspecified glaucoma: Secondary | ICD-10-CM | POA: Diagnosis not present

## 2018-03-24 DIAGNOSIS — R42 Dizziness and giddiness: Secondary | ICD-10-CM | POA: Diagnosis not present

## 2018-03-24 DIAGNOSIS — R51 Headache: Secondary | ICD-10-CM | POA: Diagnosis not present

## 2018-03-25 ENCOUNTER — Ambulatory Visit
Admission: RE | Admit: 2018-03-25 | Discharge: 2018-03-25 | Disposition: A | Discharge status: Home / Self Care | Payer: Medicare Other | Source: Ambulatory Visit | Attending: Family Medicine | Admitting: Family Medicine

## 2018-03-25 ENCOUNTER — Other Ambulatory Visit: Payer: Self-pay | Admitting: Family Medicine

## 2018-03-25 DIAGNOSIS — W19XXXA Unspecified fall, initial encounter: Secondary | ICD-10-CM

## 2018-03-25 DIAGNOSIS — S6991XA Unspecified injury of right wrist, hand and finger(s), initial encounter: Secondary | ICD-10-CM | POA: Diagnosis not present

## 2018-03-28 DIAGNOSIS — Z9181 History of falling: Secondary | ICD-10-CM | POA: Diagnosis not present

## 2018-03-28 DIAGNOSIS — H409 Unspecified glaucoma: Secondary | ICD-10-CM | POA: Diagnosis not present

## 2018-03-28 DIAGNOSIS — M170 Bilateral primary osteoarthritis of knee: Secondary | ICD-10-CM | POA: Diagnosis not present

## 2018-03-29 DIAGNOSIS — Z9181 History of falling: Secondary | ICD-10-CM | POA: Diagnosis not present

## 2018-03-29 DIAGNOSIS — M170 Bilateral primary osteoarthritis of knee: Secondary | ICD-10-CM | POA: Diagnosis not present

## 2018-03-29 DIAGNOSIS — H409 Unspecified glaucoma: Secondary | ICD-10-CM | POA: Diagnosis not present

## 2018-03-30 DIAGNOSIS — Z9181 History of falling: Secondary | ICD-10-CM | POA: Diagnosis not present

## 2018-03-30 DIAGNOSIS — H409 Unspecified glaucoma: Secondary | ICD-10-CM | POA: Diagnosis not present

## 2018-03-30 DIAGNOSIS — M170 Bilateral primary osteoarthritis of knee: Secondary | ICD-10-CM | POA: Diagnosis not present

## 2018-04-08 DIAGNOSIS — M170 Bilateral primary osteoarthritis of knee: Secondary | ICD-10-CM | POA: Diagnosis not present

## 2018-04-08 DIAGNOSIS — H409 Unspecified glaucoma: Secondary | ICD-10-CM | POA: Diagnosis not present

## 2018-04-08 DIAGNOSIS — Z9181 History of falling: Secondary | ICD-10-CM | POA: Diagnosis not present

## 2018-04-11 DIAGNOSIS — N390 Urinary tract infection, site not specified: Secondary | ICD-10-CM | POA: Diagnosis not present

## 2018-04-11 DIAGNOSIS — K219 Gastro-esophageal reflux disease without esophagitis: Secondary | ICD-10-CM | POA: Diagnosis not present

## 2018-04-11 DIAGNOSIS — I10 Essential (primary) hypertension: Secondary | ICD-10-CM | POA: Diagnosis not present

## 2018-04-12 DIAGNOSIS — M170 Bilateral primary osteoarthritis of knee: Secondary | ICD-10-CM | POA: Diagnosis not present

## 2018-04-12 DIAGNOSIS — H409 Unspecified glaucoma: Secondary | ICD-10-CM | POA: Diagnosis not present

## 2018-04-12 DIAGNOSIS — Z9181 History of falling: Secondary | ICD-10-CM | POA: Diagnosis not present

## 2018-04-19 DIAGNOSIS — H409 Unspecified glaucoma: Secondary | ICD-10-CM | POA: Diagnosis not present

## 2018-04-19 DIAGNOSIS — M170 Bilateral primary osteoarthritis of knee: Secondary | ICD-10-CM | POA: Diagnosis not present

## 2018-04-19 DIAGNOSIS — Z9181 History of falling: Secondary | ICD-10-CM | POA: Diagnosis not present

## 2018-04-25 DIAGNOSIS — Z6829 Body mass index (BMI) 29.0-29.9, adult: Secondary | ICD-10-CM | POA: Diagnosis not present

## 2018-04-25 DIAGNOSIS — N390 Urinary tract infection, site not specified: Secondary | ICD-10-CM | POA: Diagnosis not present

## 2018-08-10 DIAGNOSIS — I10 Essential (primary) hypertension: Secondary | ICD-10-CM | POA: Diagnosis not present

## 2018-08-10 DIAGNOSIS — Z6828 Body mass index (BMI) 28.0-28.9, adult: Secondary | ICD-10-CM | POA: Diagnosis not present

## 2018-08-10 DIAGNOSIS — M130 Polyarthritis, unspecified: Secondary | ICD-10-CM | POA: Diagnosis not present

## 2018-09-22 DIAGNOSIS — J069 Acute upper respiratory infection, unspecified: Secondary | ICD-10-CM | POA: Diagnosis not present

## 2018-09-22 DIAGNOSIS — M25569 Pain in unspecified knee: Secondary | ICD-10-CM | POA: Diagnosis not present

## 2018-09-28 DIAGNOSIS — H401133 Primary open-angle glaucoma, bilateral, severe stage: Secondary | ICD-10-CM | POA: Diagnosis not present

## 2018-09-29 DIAGNOSIS — H401133 Primary open-angle glaucoma, bilateral, severe stage: Secondary | ICD-10-CM | POA: Diagnosis not present

## 2018-10-07 DIAGNOSIS — H401133 Primary open-angle glaucoma, bilateral, severe stage: Secondary | ICD-10-CM | POA: Diagnosis not present

## 2019-03-28 DIAGNOSIS — M10271 Drug-induced gout, right ankle and foot: Secondary | ICD-10-CM | POA: Diagnosis not present

## 2019-03-28 DIAGNOSIS — M130 Polyarthritis, unspecified: Secondary | ICD-10-CM | POA: Diagnosis not present

## 2019-03-28 DIAGNOSIS — R1030 Lower abdominal pain, unspecified: Secondary | ICD-10-CM | POA: Diagnosis not present

## 2019-03-28 DIAGNOSIS — N3941 Urge incontinence: Secondary | ICD-10-CM | POA: Diagnosis not present

## 2019-03-28 DIAGNOSIS — K219 Gastro-esophageal reflux disease without esophagitis: Secondary | ICD-10-CM | POA: Diagnosis not present

## 2019-03-28 DIAGNOSIS — R7309 Other abnormal glucose: Secondary | ICD-10-CM | POA: Diagnosis not present

## 2019-03-28 DIAGNOSIS — N3281 Overactive bladder: Secondary | ICD-10-CM | POA: Diagnosis not present

## 2019-03-28 DIAGNOSIS — J1089 Influenza due to other identified influenza virus with other manifestations: Secondary | ICD-10-CM | POA: Diagnosis not present

## 2019-03-28 DIAGNOSIS — R7303 Prediabetes: Secondary | ICD-10-CM | POA: Diagnosis not present

## 2019-03-28 DIAGNOSIS — M1990 Unspecified osteoarthritis, unspecified site: Secondary | ICD-10-CM | POA: Diagnosis not present

## 2019-03-28 DIAGNOSIS — D638 Anemia in other chronic diseases classified elsewhere: Secondary | ICD-10-CM | POA: Diagnosis not present

## 2019-03-28 DIAGNOSIS — I10 Essential (primary) hypertension: Secondary | ICD-10-CM | POA: Diagnosis not present

## 2019-03-31 ENCOUNTER — Other Ambulatory Visit: Payer: Self-pay

## 2019-05-18 ENCOUNTER — Emergency Department (HOSPITAL_COMMUNITY): Payer: Medicare Other

## 2019-05-18 ENCOUNTER — Emergency Department (HOSPITAL_COMMUNITY)
Admission: EM | Admit: 2019-05-18 | Discharge: 2019-05-18 | Disposition: A | Discharge status: Home / Self Care | Payer: Medicare Other | Attending: Emergency Medicine | Admitting: Emergency Medicine

## 2019-05-18 ENCOUNTER — Other Ambulatory Visit: Payer: Self-pay

## 2019-05-18 ENCOUNTER — Encounter (HOSPITAL_COMMUNITY): Payer: Self-pay

## 2019-05-18 DIAGNOSIS — I959 Hypotension, unspecified: Secondary | ICD-10-CM | POA: Diagnosis not present

## 2019-05-18 DIAGNOSIS — I5032 Chronic diastolic (congestive) heart failure: Secondary | ICD-10-CM | POA: Insufficient documentation

## 2019-05-18 DIAGNOSIS — M25551 Pain in right hip: Secondary | ICD-10-CM | POA: Diagnosis not present

## 2019-05-18 DIAGNOSIS — M25572 Pain in left ankle and joints of left foot: Secondary | ICD-10-CM | POA: Diagnosis not present

## 2019-05-18 DIAGNOSIS — I110 Hypertensive heart disease with heart failure: Secondary | ICD-10-CM | POA: Diagnosis not present

## 2019-05-18 DIAGNOSIS — R5381 Other malaise: Secondary | ICD-10-CM | POA: Diagnosis not present

## 2019-05-18 DIAGNOSIS — R52 Pain, unspecified: Secondary | ICD-10-CM | POA: Diagnosis not present

## 2019-05-18 DIAGNOSIS — Z79899 Other long term (current) drug therapy: Secondary | ICD-10-CM | POA: Insufficient documentation

## 2019-05-18 DIAGNOSIS — M7989 Other specified soft tissue disorders: Secondary | ICD-10-CM | POA: Diagnosis not present

## 2019-05-18 DIAGNOSIS — E119 Type 2 diabetes mellitus without complications: Secondary | ICD-10-CM | POA: Insufficient documentation

## 2019-05-18 MED ORDER — ACETAMINOPHEN 500 MG PO TABS
1000.0000 mg | ORAL_TABLET | Freq: Once | ORAL | Status: AC
  Administered 2019-05-18: 1000 mg via ORAL
  Filled 2019-05-18: qty 2

## 2019-05-18 MED ORDER — HYDROCODONE-ACETAMINOPHEN 5-325 MG PO TABS
1.0000 | ORAL_TABLET | Freq: Once | ORAL | Status: AC
  Administered 2019-05-18: 1 via ORAL
  Filled 2019-05-18: qty 1

## 2019-05-18 MED ORDER — ONDANSETRON 4 MG PO TBDP
4.0000 mg | ORAL_TABLET | Freq: Once | ORAL | Status: AC
  Administered 2019-05-18: 4 mg via ORAL
  Filled 2019-05-18: qty 1

## 2019-05-18 MED ORDER — TIZANIDINE HCL 2 MG PO TABS: 2.0000 mg | ORAL_TABLET | Freq: Four times a day (QID) | ORAL | 0 refills | Status: DC | PRN

## 2019-05-18 MED ORDER — METHOCARBAMOL 500 MG PO TABS
500.0000 mg | ORAL_TABLET | Freq: Once | ORAL | Status: AC
  Administered 2019-05-18: 500 mg via ORAL
  Filled 2019-05-18: qty 1

## 2019-05-18 MED ORDER — HYDROCODONE-ACETAMINOPHEN 5-325 MG PO TABS: 2.0000 | ORAL_TABLET | ORAL | 0 refills | Status: DC | PRN

## 2019-05-18 NOTE — ED Provider Notes (Signed)
[facility] EMERGENCY DEPARTMENT Provider Note   CSN: [id number] Arrival date & time: 05/18/19  2028    History   Chief Complaint Chief Complaint  Patient presents with  . Leg Pain    Hip/leg   HPI Brenda Kerr is a 78 y.o. female with past medical history significant for diabetes, arthritis, PVD, hiatal hernia, frequent falls who presents for evaluation of right hip pain.  Patient states she has had aching to her right hip x4 days.  Denies any injury or falls.  Patient states she will occasionally get aching to her right hip however normally goes away after 2 days.  Has not taken anything at home for her pain.  She rates her current pain a 8/10.  Denies fever, chills, nausea, vomiting, chest pain, shortness of breath, abdominal pain decreased range of motion, numbness or tingling, redness, swelling, warmth to extremities.  Denies additional aggravating or alleviating factors. Uses a walker at baseline. Pain worse with movement.  Patient states "Im just here to make sure its no broken."  History obtained from patient, family in room and past medical records.  No interpreter was used.     HPI  Past Medical History:  Diagnosis Date  . Arthritis   . Diabetes mellitus without complication ([place])   . Dysrhythmia   . GERD (gastroesophageal reflux disease)   . Glaucoma   . H/O hiatal hernia   . Hypertension   . Peripheral vascular disease ([place])   . Shortness of breath    walk a long way    Patient Active Problem List   Diagnosis Date Noted  . Venous stasis ulcers of both lower extremities ([place]) 02/02/2016  . Acute blood loss anemia 02/02/2016  . Esophageal ulcer with bleeding 02/01/2016  . Diastolic CHF ([place]) 99991111  . Falls 01/31/2016  . Essential hypertension 01/31/2016  . Venous stasis 01/30/2016  . Physical deconditioning 01/30/2016  . Cellulitis of right leg 01/28/2016  . HTN (hypertension) 01/28/2016  . Abnormality of gait 04/19/2012  . Hip pain 04/19/2012  .  Hip stiffness 04/19/2012    Past Surgical History:  Procedure Laterality Date  . ARTHROPLASTY  03/09/2012   lt hip  . ESOPHAGOGASTRODUODENOSCOPY Left 01/31/2016   Procedure: ESOPHAGOGASTRODUODENOSCOPY (EGD);  Surgeon: Arta Silence, MD;  Location: [facility];  Service: Endoscopy;  Laterality: Left;  . EYE SURGERY    . JOINT REPLACEMENT     hip replacement  . LEFT HEART CATHETERIZATION WITH CORONARY ANGIOGRAM N/A 05/29/2014   Procedure: LEFT HEART CATHETERIZATION WITH CORONARY ANGIOGRAM;  Surgeon: Laverda Page, MD;  Location: [facility] CATH LAB;  Service: Cardiovascular;  Laterality: N/A;  . TOTAL HIP ARTHROPLASTY  03/09/2012   Procedure: TOTAL HIP ARTHROPLASTY;  Surgeon: Sharmon Revere, MD;  Location: [facility];  Service: Orthopedics;  Laterality: Left;     OB History   No obstetric history on file.      Home Medications    Prior to Admission medications   Medication Sig Start Date End Date Taking? Authorizing Provider  acetaminophen (TYLENOL) 500 MG tablet Take 500 mg by mouth every 6 (six) hours as needed for mild pain.    [provider]  amLODipine (NORVASC) 5 MG tablet Take 1 tablet by mouth daily. 12/29/18   [provider]  bimatoprost (LUMIGAN) 0.01 % SOLN Place 1 drop into both eyes at bedtime.      [provider]  brimonidine-timolol (COMBIGAN) 0.2-0.5 % ophthalmic solution Place 1 drop into both eyes every 12 (  twelve) hours.      [provider]  dorzolamide (TRUSOPT) 2 % ophthalmic solution Place 1 drop into both eyes 2 (two) times daily.    [provider]  HYDROcodone-acetaminophen (NORCO/VICODIN) 5-325 MG tablet Take 2 tablets by mouth every 4 (four) hours as needed. 05/18/19   Henderly, Britni A, PA-C  labetalol (NORMODYNE) 100 MG tablet Take 100 mg by mouth 2 (two) times daily. 11/22/17   [provider]  meclizine (ANTIVERT) 12.5 MG tablet Take 1 tablet (12.5 mg total) by mouth 2 (two) times daily as needed for  dizziness. 11/25/17   Hayden Rasmussen, MD  pantoprazole (PROTONIX) 40 MG tablet Take 1 tablet by mouth 2 (two) times daily as needed. 11/22/18   [provider]  spironolactone (ALDACTONE) 25 MG tablet Take 25 mg by mouth 2 (two) times daily. 11/22/17   [provider]  tiZANidine (ZANAFLEX) 2 MG tablet Take 1 tablet (2 mg total) by mouth every 6 (six) hours as needed for muscle spasms. 05/18/19   Henderly, Britni A, PA-C    Family History No family history on file.  Social History Social History   Tobacco Use  . Smoking status: Never Smoker  . Smokeless tobacco: Never Used  Substance Use Topics  . Alcohol use: No  . Drug use: No     Allergies   Codeine   Review of Systems Review of Systems  Constitutional: Negative.   HENT: Negative.   Respiratory: Negative.   Cardiovascular: Negative.   Gastrointestinal: Negative.   Genitourinary: Negative.   Musculoskeletal: Negative for arthralgias, back pain, gait problem, myalgias, neck pain and neck stiffness.       Right hip pain  Skin: Negative.   Neurological: Negative.   All other systems reviewed and are negative.    Physical Exam Updated Vital Signs BP (!) 128/54   Pulse 70   Temp 98.3 F (36.8 C) (Oral)   Resp 17   Ht 5\' 2"  (1.575 m)   Wt 68.9 kg   SpO2 99%   BMI 27.80 kg/m   Physical Exam Vitals signs and nursing note reviewed.  Constitutional:      General: She is not in acute distress.    Appearance: She is well-developed. She is not ill-appearing, toxic-appearing or diaphoretic.  HENT:     Head: Normocephalic and atraumatic.     Nose: Nose normal.     Mouth/Throat:     Mouth: Mucous membranes are moist.     Pharynx: Oropharynx is clear.  Eyes:     Pupils: Pupils are equal, round, and reactive to light.  Neck:     Musculoskeletal: Normal range of motion.  Cardiovascular:     Rate and Rhythm: Normal rate.     Pulses: Normal pulses.          Femoral pulses are 2+ on the right side  and 2+ on the left side.      Dorsalis pedis pulses are 2+ on the right side and 2+ on the left side.       Posterior tibial pulses are 2+ on the right side and 2+ on the left side.     Heart sounds: Normal heart sounds.  Pulmonary:     Effort: Pulmonary effort is normal. No respiratory distress.     Breath sounds: Normal breath sounds.  Abdominal:     General: Bowel sounds are normal. There is no distension.     Hernia: No hernia is present.  Comments: Soft, nontender without rebound or guarding.  No abdominal wall is.  No tenderness to groin, inguinal area.  No overlying skin changes.  Musculoskeletal: Normal range of motion.     Right hip: She exhibits tenderness and bony tenderness. She exhibits normal range of motion, normal strength, no swelling, no crepitus, no deformity and no laceration.     Left hip: Normal.     Right knee: Normal.     Left knee: Normal.     Right ankle: Normal.     Left ankle: Normal.     Lumbar back: Normal.       Back:     Right upper leg: Normal.     Right lower leg: Normal.       Legs:     Right foot: Normal.     Comments: Mild tenderness to right anterior and lateral hip. No tenderness to SI joint, quadriceps.  Able to straight leg raise without difficulty.  Flexes and extends at bilateral hips without difficulty.  No overlying skin changes.  No shortening or malrotation of legs.  Feet:     Right foot:     Protective Sensation: 2 sites tested. 2 sites sensed.     Skin integrity: Dry skin present.     Toenail Condition: Right toenails are abnormally thick and long.     Left foot:     Protective Sensation: 2 sites tested. 2 sites sensed.     Skin integrity: Dry skin present.     Toenail Condition: Left toenails are abnormally thick and long.     Comments: Thickened toe nail to great toes bilaterally. No skin breakdown. No erythema, warmth, edema to bilateral lower extremities. Skin:    General: Skin is warm and dry.  Neurological:     General:  No focal deficit present.     Mental Status: She is alert.     Cranial Nerves: Cranial nerves are intact.     Sensory: Sensation is intact.     Motor: Motor function is intact.     Coordination: Coordination is intact.     Comments: 4+ strength bilateral upper and lower extremities without difficulty.    ED Treatments / Results  Labs (all labs ordered are listed, but only abnormal results are displayed) Labs Reviewed - No data to display  EKG None  Radiology Dg Hip Unilat W Or Wo Pelvis 2-3 Views Right  Result Date: 05/18/2019 CLINICAL DATA:  The patient states weakness and right hip pain / swelling x a few days, no known injury. Unable to bear weight. Hx of arthritis and bilateral hip replacements. EXAM: DG HIP (WITH OR WITHOUT PELVIS) 2-3V RIGHT COMPARISON:  09/22/2015 FINDINGS: Bilateral hip prostheses appear well seated and well aligned. No evidence of loosening. No fracture or bone lesion. SI joints and symphysis pubis are normally spaced and aligned. Skeletal structures are demineralized. Soft tissues are unremarkable. IMPRESSION: 1. No fracture, bone lesion or acute finding. 2. No evidence of loosening or malalignment of either hip total hip arthroplasty. Electronically Signed   By: Lajean Manes M.D.   On: 05/18/2019 22:05    Procedures Procedures (including critical care time)  Medications Ordered in ED Medications  methocarbamol (ROBAXIN) tablet 500 mg (500 mg Oral Given 05/18/19 2112)  acetaminophen (TYLENOL) tablet 1,000 mg (1,000 mg Oral Given 05/18/19 2112)  HYDROcodone-acetaminophen (NORCO/VICODIN) 5-325 MG per tablet 1 tablet (1 tablet Oral Given 05/18/19 2229)  ondansetron (ZOFRAN-ODT) disintegrating tablet 4 mg (4 mg Oral Given 05/18/19 2229)  Initial Impression / Assessment and Plan / ED Course  I have reviewed the triage vital signs and the nursing notes.  Pertinent labs & imaging results that were available during my care of the patient were reviewed by me and  considered in my medical decision making (see chart for details).  78 year old female appears otherwise well presents for evaluation of right hip pain.  She is afebrile, nonseptic, non-ill-appearing.  No recent injury or trauma.  Does have mild tenderness to her right anterior and lateral hip.  No overlying skin changes. She is neurovascularly intact with 2+ femoral, DP and PT pulses.  No pallor to the legs.  No evidence of inguinal hernias. Abd soft, nontender without rebound or guarding.  No hernias. No skin changes in GU region. Pain worse with ambulation however she does have full range of motion with equal strength to bilateral legs.  Patient ambulates with walker at baseline.  She does not want to trial ambulation in emergency department.  No evidence of DVT on exam.  She denies chest pain, shortness of breath.  She has stable vital signs without tachycardia, tachypnea.  Plain film x-ray without fracture, dislocation, effusion.  Offered patient labs, CT imaging given her pain.  Patient states "I am just here for an x-ray to make sure it is not broken."  Discussed risk versus benefit of limited evaluation with chest x-ray.  Patient voiced understanding as well as her husband however states he will follow-up with orthopedist tomorrow.  I have low suspicion for septic joint, gout, hemarthrosis, ligament, tendon injury.  No evidence of fracture, dislocation or loosening of prior hardware.  She has full and equal pulses I have low suspicion for arterial occlusion or vascular injury.  The patient has been appropriately medically screened and/or stabilized in the ED. I have low suspicion for any other emergent medical condition which would require further screening, evaluation or treatment in the ED or require inpatient management.  Patient is hemodynamically stable and in no acute distress.  Patient able to ambulate in department prior to ED.  Evaluation does not show acute pathology that would require ongoing  or additional emergent interventions while in the emergency department or further inpatient treatment.  I have discussed the diagnosis with the patient and answered all questions.  Pain is been managed while in the emergency department and patient has no further complaints prior to discharge.  Patient is comfortable with plan discussed in room and is stable for discharge at this time.  I have discussed strict return precautions for returning to the emergency department.  Patient was encouraged to follow-up with PCP/specialist refer to at discharge.      Final Clinical Impressions(s) / ED Diagnoses   Final diagnoses:  Right hip pain    ED Discharge Orders         Ordered    HYDROcodone-acetaminophen (NORCO/VICODIN) 5-325 MG tablet  Every 4 hours PRN     05/18/19 2241    tiZANidine (ZANAFLEX) 2 MG tablet  Every 6 hours PRN     05/18/19 2242           Henderly, Britni A, PA-C 05/18/19 2303    Davonna Belling, MD 05/18/19 2346

## 2019-05-18 NOTE — ED Notes (Signed)
Pt refusing to ambulate.  

## 2019-05-18 NOTE — ED Triage Notes (Signed)
Pt reports right hip replacement previously, states her hip and leg started hurting yesterday.  Pt denies fall or other injury.

## 2019-05-18 NOTE — Discharge Instructions (Addendum)
Take the medication as prescribed. Follow up with Orthopedics tomorrow morning. Return for new or worsening symptoms.

## 2019-05-18 NOTE — ED Notes (Signed)
Pt signature did not save on pad.  Pt verbally states she understands discharge information.  Discharge with family, husband, can transition from wheelchair to car.

## 2019-05-23 ENCOUNTER — Emergency Department (HOSPITAL_COMMUNITY): Payer: Medicare Other

## 2019-05-23 ENCOUNTER — Encounter (HOSPITAL_COMMUNITY): Payer: Self-pay | Admitting: Emergency Medicine

## 2019-05-23 ENCOUNTER — Emergency Department (HOSPITAL_COMMUNITY)
Admission: EM | Admit: 2019-05-23 | Discharge: 2019-05-23 | Disposition: A | Discharge status: Home / Self Care | Payer: Medicare Other | Attending: Emergency Medicine | Admitting: Emergency Medicine

## 2019-05-23 ENCOUNTER — Other Ambulatory Visit: Payer: Self-pay

## 2019-05-23 DIAGNOSIS — E119 Type 2 diabetes mellitus without complications: Secondary | ICD-10-CM | POA: Insufficient documentation

## 2019-05-23 DIAGNOSIS — M48061 Spinal stenosis, lumbar region without neurogenic claudication: Secondary | ICD-10-CM | POA: Diagnosis not present

## 2019-05-23 DIAGNOSIS — I110 Hypertensive heart disease with heart failure: Secondary | ICD-10-CM | POA: Diagnosis not present

## 2019-05-23 DIAGNOSIS — M25551 Pain in right hip: Secondary | ICD-10-CM | POA: Diagnosis not present

## 2019-05-23 DIAGNOSIS — Z79899 Other long term (current) drug therapy: Secondary | ICD-10-CM | POA: Insufficient documentation

## 2019-05-23 DIAGNOSIS — Z96642 Presence of left artificial hip joint: Secondary | ICD-10-CM | POA: Diagnosis not present

## 2019-05-23 DIAGNOSIS — M5127 Other intervertebral disc displacement, lumbosacral region: Secondary | ICD-10-CM

## 2019-05-23 DIAGNOSIS — Z96641 Presence of right artificial hip joint: Secondary | ICD-10-CM | POA: Diagnosis not present

## 2019-05-23 DIAGNOSIS — Z471 Aftercare following joint replacement surgery: Secondary | ICD-10-CM | POA: Diagnosis not present

## 2019-05-23 DIAGNOSIS — I5030 Unspecified diastolic (congestive) heart failure: Secondary | ICD-10-CM | POA: Diagnosis not present

## 2019-05-23 DIAGNOSIS — M5126 Other intervertebral disc displacement, lumbar region: Secondary | ICD-10-CM | POA: Diagnosis not present

## 2019-05-23 LAB — COMPREHENSIVE METABOLIC PANEL
ALT: 32 U/L (ref 0–44)
AST: 38 U/L (ref 15–41)
Albumin: 4.2 g/dL (ref 3.5–5.0)
Alkaline Phosphatase: 99 U/L (ref 38–126)
Anion gap: 10 (ref 5–15)
BUN: 20 mg/dL (ref 8–23)
CO2: 23 mmol/L (ref 22–32)
Calcium: 9.9 mg/dL (ref 8.9–10.3)
Chloride: 107 mmol/L (ref 98–111)
Creatinine, Ser: 0.94 mg/dL (ref 0.44–1.00)
GFR calc Af Amer: >60 mL/min (ref >60)
GFR calc non Af Amer: 58 mL/min — ABNORMAL LOW (ref >60)
Glucose, Bld: 99 mg/dL (ref 70–99)
Potassium: 4.1 mmol/L (ref 3.5–5.1)
Sodium: 140 mmol/L (ref 135–145)
Total Bilirubin: 0.7 mg/dL (ref 0.3–1.2)
Total Protein: 7.6 g/dL (ref 6.5–8.1)

## 2019-05-23 LAB — CBC WITH DIFFERENTIAL/PLATELET
Abs Immature Granulocytes: 0.03 10*3/uL (ref 0.00–0.07)
Basophils Absolute: 0.1 10*3/uL (ref 0.0–0.1)
Basophils Relative: 1 %
Eosinophils Absolute: 0.2 10*3/uL (ref 0.0–0.5)
Eosinophils Relative: 3 %
HCT: 40.9 % (ref 36.0–46.0)
Hemoglobin: 12.9 g/dL (ref 12.0–15.0)
Immature Granulocytes: 0 %
Lymphocytes Relative: 26 %
Lymphs Abs: 2 10*3/uL (ref 0.7–4.0)
MCH: 28.6 pg (ref 26.0–34.0)
MCHC: 31.5 g/dL (ref 30.0–36.0)
MCV: 90.7 fL (ref 80.0–100.0)
Monocytes Absolute: 0.9 10*3/uL (ref 0.1–1.0)
Monocytes Relative: 11 %
Neutro Abs: 4.7 10*3/uL (ref 1.7–7.7)
Neutrophils Relative %: 59 %
Platelets: 256 10*3/uL (ref 150–400)
RBC: 4.51 MIL/uL (ref 3.87–5.11)
RDW: 14.6 % (ref 11.5–15.5)
WBC: 7.9 10*3/uL (ref 4.0–10.5)
nRBC: 0 % (ref 0.0–0.2)

## 2019-05-23 LAB — URINALYSIS, ROUTINE W REFLEX MICROSCOPIC
Bilirubin Urine: NEGATIVE
Glucose, UA: NEGATIVE mg/dL
Hgb urine dipstick: NEGATIVE
Ketones, ur: NEGATIVE mg/dL
Leukocytes,Ua: NEGATIVE
Nitrite: NEGATIVE
Protein, ur: NEGATIVE mg/dL
Specific Gravity, Urine: 1.009 (ref 1.005–1.030)
pH: 6 (ref 5.0–8.0)

## 2019-05-23 MED ORDER — ONDANSETRON 4 MG PO TBDP: 4.0000 mg | ORAL_TABLET | Freq: Three times a day (TID) | ORAL | 0 refills | Status: DC | PRN

## 2019-05-23 MED ORDER — DEXAMETHASONE SODIUM PHOSPHATE 10 MG/ML IJ SOLN
10.0000 mg | Freq: Once | INTRAMUSCULAR | Status: AC
  Administered 2019-05-23: 10 mg via INTRAVENOUS
  Filled 2019-05-23: qty 1

## 2019-05-23 MED ORDER — OXYCODONE-ACETAMINOPHEN 5-325 MG PO TABS: 2.0000 | ORAL_TABLET | ORAL | 0 refills | Status: DC | PRN

## 2019-05-23 MED ORDER — PREDNISONE 10 MG (21) PO TBPK: ORAL_TABLET | ORAL | 0 refills | Status: DC

## 2019-05-23 MED ORDER — ONDANSETRON HCL 4 MG/2ML IJ SOLN
4.0000 mg | Freq: Once | INTRAMUSCULAR | Status: AC
  Administered 2019-05-23: 09:00:00 4 mg via INTRAVENOUS
  Filled 2019-05-23: qty 2

## 2019-05-23 MED ORDER — MORPHINE SULFATE (PF) 4 MG/ML IV SOLN
4.0000 mg | Freq: Once | INTRAVENOUS | Status: AC
  Administered 2019-05-23: 09:00:00 4 mg via INTRAVENOUS
  Filled 2019-05-23: qty 1

## 2019-05-23 MED ORDER — DIAZEPAM 5 MG PO TABS: 5.0000 mg | ORAL_TABLET | Freq: Two times a day (BID) | ORAL | 0 refills | Status: DC

## 2019-05-23 MED ORDER — SODIUM CHLORIDE 0.9 % IV BOLUS
1000.0000 mL | Freq: Once | INTRAVENOUS | Status: AC
  Administered 2019-05-23: 1000 mL via INTRAVENOUS

## 2019-05-23 MED ORDER — FENTANYL CITRATE (PF) 100 MCG/2ML IJ SOLN
50.0000 ug | Freq: Once | INTRAMUSCULAR | Status: AC
  Administered 2019-05-23: 50 ug via INTRAVENOUS
  Filled 2019-05-23: qty 2

## 2019-05-23 NOTE — ED Triage Notes (Signed)
Patient here from home with complaints of right hip pain that started last week. Reports previous hip replacement to right side. States that she was seen at [facility] for same. Denies fall/injury.

## 2019-05-23 NOTE — ED Provider Notes (Signed)
[facility] DEPT Provider Note   CSN: [id number] Arrival date & time: 05/23/19  0408     History   Chief Complaint Chief Complaint  Patient presents with   Hip Pain    HPI Brenda Kerr is a 78 y.o. female.     Pt presents to the ED today with right hip pain.  She denies any trauma, but has not been able to put any weight on it.  She did go to [facility] ED on 9/17 with the same complaint.  They offered a CT hip after xray was negative, but she refused.  Pt said hip is no better and is getting worse.  Pain meds given do not help.     Past Medical History:  Diagnosis Date   Arthritis    Diabetes mellitus without complication ([facility])    Dysrhythmia    GERD (gastroesophageal reflux disease)    Glaucoma    H/O hiatal hernia    Hypertension    Peripheral vascular disease ([facility])    Shortness of breath    walk a long way    Patient Active Problem List   Diagnosis Date Noted   Venous stasis ulcers of both lower extremities ([place]) 02/02/2016   Acute blood loss anemia 02/02/2016   Esophageal ulcer with bleeding [id number]   Diastolic CHF ([place]) [id number]   Falls 01/31/2016   Essential hypertension 01/31/2016   Venous stasis 01/30/2016   Physical deconditioning 01/30/2016   Cellulitis of right leg 01/28/2016   HTN (hypertension) 01/28/2016   Abnormality of gait 04/19/2012   Hip pain 04/19/2012   Hip stiffness 04/19/2012    Past Surgical History:  Procedure Laterality Date   ARTHROPLASTY  03/09/2012   lt hip   ESOPHAGOGASTRODUODENOSCOPY Left 01/31/2016   Procedure: ESOPHAGOGASTRODUODENOSCOPY (EGD);  Surgeon: Arta Silence, MD;  Location: [facility];  Service: Endoscopy;  Laterality: Left;   EYE SURGERY     JOINT REPLACEMENT     hip replacement   LEFT HEART CATHETERIZATION WITH CORONARY ANGIOGRAM N/A 05/29/2014   Procedure: LEFT HEART CATHETERIZATION WITH CORONARY ANGIOGRAM;  Surgeon: Laverda Page, MD;  [facility] CATH LAB;  Service: Cardiovascular;  Laterality: N/A;   TOTAL HIP ARTHROPLASTY  03/09/2012   Procedure: TOTAL HIP ARTHROPLASTY;  Surgeon: Sharmon Revere, MD;  Location: [facility];  Service: Orthopedics;  Laterality: Left;     OB History   No obstetric history on file.      Home Medications    Prior to Admission medications   Medication Sig Start Date End Date Taking? Authorizing Provider  acetaminophen (TYLENOL) 500 MG tablet Take 500 mg by mouth every 6 (six) hours as needed for mild pain.   Yes [provider]  bimatoprost (LUMIGAN) 0.01 % SOLN Place 1 drop into both eyes at bedtime.     Yes [provider]  brimonidine-timolol (COMBIGAN) 0.2-0.5 % ophthalmic solution Place 1 drop into both eyes every 12 (twelve) hours.     Yes [provider]  diclofenac sodium (VOLTAREN) 1 % GEL Apply 2 g topically 4 (four) times daily as needed for pain. 05/16/19  Yes [provider]  dorzolamide (TRUSOPT) 2 % ophthalmic solution Place 1 drop into both eyes 2 (two) times daily.   Yes [provider]  ibuprofen (ADVIL) 200 MG tablet Take 400 mg by mouth every 6 (six) hours as needed for fever or moderate pain.   Yes [provider]  labetalol (NORMODYNE) 100 MG tablet Take 100  mg by mouth 2 (two) times daily. 11/22/17  Yes [provider]  spironolactone (ALDACTONE) 25 MG tablet Take 25 mg by mouth 2 (two) times daily. 11/22/17  Yes [provider]  tiZANidine (ZANAFLEX) 2 MG tablet Take 1 tablet (2 mg total) by mouth every 6 (six) hours as needed for muscle spasms. 05/18/19  Yes Henderly, Britni A, PA-C  diazepam (VALIUM) 5 MG tablet Take 1 tablet (5 mg total) by mouth 2 (two) times daily. 05/23/19   Isla Pence, MD  HYDROcodone-acetaminophen (NORCO/VICODIN) 5-325 MG tablet Take 2 tablets by mouth every 4 (four) hours as needed. 05/18/19   Henderly, Britni A, PA-C  meclizine (ANTIVERT) 12.5 MG tablet Take 1 tablet (12.5 mg total) by  mouth 2 (two) times daily as needed for dizziness. Patient not taking: Reported on 05/23/2019 11/25/17   Hayden Rasmussen, MD  ondansetron (ZOFRAN ODT) 4 MG disintegrating tablet Take 1 tablet (4 mg total) by mouth every 8 (eight) hours as needed. 05/23/19   Isla Pence, MD  oxyCODONE-acetaminophen (PERCOCET/ROXICET) 5-325 MG tablet Take 2 tablets by mouth every 4 (four) hours as needed for severe pain. 05/23/19   Isla Pence, MD  predniSONE (STERAPRED UNI-PAK 21 TAB) 10 MG (21) TBPK tablet Take 6 tabs for 2 days, then 5 for 2 days, then 4 for 2 days, then 3 for 2 days, 2 for 2 days, then 1 for 2 days 05/23/19   Isla Pence, MD    Family History No family history on file.  Social History Social History   Tobacco Use   Smoking status: Never Smoker   Smokeless tobacco: Never Used  Substance Use Topics   Alcohol use: No   Drug use: No     Allergies   Codeine   Review of Systems Review of Systems  Musculoskeletal:       Right hip pain  All other systems reviewed and are negative.    Physical Exam Updated Vital Signs BP (!) 152/84 (BP Location: Left Arm)    Pulse 69    Temp 98.8 F (37.1 C) (Oral)    Resp 19    SpO2 100%   Physical Exam Vitals signs and nursing note reviewed.  Constitutional:      Appearance: Normal appearance.  HENT:     Head: Normocephalic and atraumatic.     Right Ear: External ear normal.     Left Ear: External ear normal.     Nose: Nose normal.     Mouth/Throat:     Mouth: Mucous membranes are moist.     Pharynx: Oropharynx is clear.  Eyes:     Extraocular Movements: Extraocular movements intact.     Conjunctiva/sclera: Conjunctivae normal.     Pupils: Pupils are equal, round, and reactive to light.  Neck:     Musculoskeletal: Normal range of motion and neck supple.  Cardiovascular:     Rate and Rhythm: Normal rate and regular rhythm.     Pulses: Normal pulses.     Heart sounds: Normal heart sounds.  Pulmonary:     Effort:  Pulmonary effort is normal.     Breath sounds: Normal breath sounds.  Abdominal:     General: Abdomen is flat. Bowel sounds are normal.     Palpations: Abdomen is soft.  Musculoskeletal:       Legs:  Skin:    General: Skin is warm.     Capillary Refill: Capillary refill takes less than 2 seconds.  Neurological:  General: No focal deficit present.     Mental Status: She is alert and oriented to person, place, and time.  Psychiatric:        Mood and Affect: Mood normal.        Behavior: Behavior normal.      ED Treatments / Results  Labs (all labs ordered are listed, but only abnormal results are displayed) Labs Reviewed  COMPREHENSIVE METABOLIC PANEL - Abnormal; Notable for the following components:      Result Value   GFR calc non Af Amer 58 (*)    All other components within normal limits  URINALYSIS, ROUTINE W REFLEX MICROSCOPIC - Abnormal; Notable for the following components:   Color, Urine STRAW (*)    All other components within normal limits  URINE CULTURE  CBC WITH DIFFERENTIAL/PLATELET    EKG None  Radiology Ct Hip Right Wo Contrast  Result Date: 05/23/2019 CLINICAL DATA:  Right hip pain for 1 week. Right hip arthroplasty. EXAM: CT OF THE RIGHT HIP WITHOUT CONTRAST TECHNIQUE: Multidetector CT imaging of the right hip was performed according to the standard protocol. Multiplanar CT image reconstructions were also generated. COMPARISON:  Radiographs dated 05/18/2019 and 05/23/2019 FINDINGS: Bones/Joint/Cartilage There is a 4 x 4 x 2.5 cm lesion in the anterior superolateral aspect of the right acetabulum with a 2 x 3 cm lateral cortical defect which involves the superior aspect of the acetabulum and the lateral aspect of the ischemic just above the acetabulum. Soft tissue fills this lesion. The components of the total hip prosthesis appear in good position. No loosening of the components. Muscles and Tendons Normal. Soft tissues Severe spinal stenosis is present at  L4-5 due to a broad-based disc protrusion and hypertrophy of the ligamentum flavum and facet joints. The protrusion extends into both neural foramina and could affect either or both L4 nerves in the neural foramina. IMPRESSION: 1. 4 x 4 x 2.5 cm lesion in the anterior superolateral aspect of the right acetabulum with a 2 x 3 cm lateral cortical defect. 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 2. Severe spinal stenosis at L4-5 due to a broad-based disc protrusion and hypertrophy of the ligamentum flavum and facet joints. 3. The disc protrusion extends into both neural foramina and could affect either or both L4 nerves in the neural foramina. Electronically Signed   By: Lorriane Shire M.D.   On: 05/23/2019 12:35   Dg Hip Unilat  With Pelvis 2-3 Views Right  Result Date: 05/23/2019 CLINICAL DATA:  Hip pain.  No recent injury. EXAM: DG HIP (WITH OR WITHOUT PELVIS) 2-3V RIGHT COMPARISON:  05/18/2019.  09/22/2015.  03/29/2008. FINDINGS: Degenerative change lumbar spine and both SI joints. Bilateral total hip replacements. Hardware intact. Stable alignment. No evidence of loosening. Exam stable from prior exam. IMPRESSION: Bilateral total hip replacements. Hardware intact. Stable alignment. No evidence of loosening. No acute abnormality identified. Electronically Signed   By: Marcello Moores  Register   On: 05/23/2019 06:18    Procedures Procedures (including critical care time)  Medications Ordered in ED Medications  dexamethasone (DECADRON) injection 10 mg (has no administration in time range)  morphine 4 MG/ML injection 4 mg (4 mg Intravenous Given 05/23/19 0846)  sodium chloride 0.9 % bolus 1,000 mL (0 mLs Intravenous Stopped 05/23/19 0912)  ondansetron (ZOFRAN) injection 4 mg (4 mg Intravenous Given 05/23/19 0846)  fentaNYL (SUBLIMAZE) injection 50 mcg (50 mcg Intravenous Given 05/23/19 0956)     Initial Impression / Assessment  and Plan / ED Course  I have  reviewed the triage vital signs and the nursing notes.  Pertinent labs & imaging results that were available during my care of the patient were reviewed by me and considered in my medical decision making (see chart for details).        Pt is feeling much better after treatment.  She lives with her husband.  She is a good candidate for home health/PT.  I put in a face to face for that.  The pt is stable for d/c.  She knows to f/u with pcp/NS.  Return if worse.  Final Clinical Impressions(s) / ED Diagnoses   Final diagnoses:  Spinal stenosis of lumbar region without neurogenic claudication  Protrusion of intervertebral disc of lumbosacral region    ED Discharge Orders         Ordered    predniSONE (STERAPRED UNI-PAK 21 TAB) 10 MG (21) TBPK tablet     05/23/19 1334    oxyCODONE-acetaminophen (PERCOCET/ROXICET) 5-325 MG tablet  Every 4 hours PRN     05/23/19 1334    ondansetron (ZOFRAN ODT) 4 MG disintegrating tablet  Every 8 hours PRN     05/23/19 1334    diazepam (VALIUM) 5 MG tablet  2 times daily     05/23/19 [place], Ronit Marczak, MD 05/23/19 1337

## 2019-05-24 LAB — URINE CULTURE

## 2019-06-16 DIAGNOSIS — Z23 Encounter for immunization: Secondary | ICD-10-CM | POA: Diagnosis not present

## 2019-06-16 DIAGNOSIS — M4802 Spinal stenosis, cervical region: Secondary | ICD-10-CM | POA: Diagnosis not present

## 2019-06-16 DIAGNOSIS — R634 Abnormal weight loss: Secondary | ICD-10-CM | POA: Diagnosis not present

## 2019-06-16 DIAGNOSIS — I10 Essential (primary) hypertension: Secondary | ICD-10-CM | POA: Diagnosis not present

## 2019-06-23 DIAGNOSIS — G834 Cauda equina syndrome: Secondary | ICD-10-CM | POA: Diagnosis not present

## 2019-06-23 DIAGNOSIS — G959 Disease of spinal cord, unspecified: Secondary | ICD-10-CM | POA: Diagnosis not present

## 2019-06-29 DIAGNOSIS — G834 Cauda equina syndrome: Secondary | ICD-10-CM | POA: Diagnosis not present

## 2019-06-29 DIAGNOSIS — M4802 Spinal stenosis, cervical region: Secondary | ICD-10-CM | POA: Diagnosis not present

## 2019-06-29 DIAGNOSIS — M5124 Other intervertebral disc displacement, thoracic region: Secondary | ICD-10-CM | POA: Diagnosis not present

## 2019-06-29 DIAGNOSIS — M50221 Other cervical disc displacement at C4-C5 level: Secondary | ICD-10-CM | POA: Diagnosis not present

## 2019-06-29 DIAGNOSIS — G959 Disease of spinal cord, unspecified: Secondary | ICD-10-CM | POA: Diagnosis not present

## 2019-06-29 DIAGNOSIS — M47814 Spondylosis without myelopathy or radiculopathy, thoracic region: Secondary | ICD-10-CM | POA: Diagnosis not present

## 2019-06-29 DIAGNOSIS — M4804 Spinal stenosis, thoracic region: Secondary | ICD-10-CM | POA: Diagnosis not present

## 2019-06-29 DIAGNOSIS — M47815 Spondylosis without myelopathy or radiculopathy, thoracolumbar region: Secondary | ICD-10-CM | POA: Diagnosis not present

## 2019-06-30 ENCOUNTER — Emergency Department (HOSPITAL_COMMUNITY): Payer: Medicare Other

## 2019-06-30 ENCOUNTER — Other Ambulatory Visit: Payer: Self-pay

## 2019-06-30 ENCOUNTER — Encounter (HOSPITAL_COMMUNITY): Payer: Self-pay

## 2019-06-30 ENCOUNTER — Inpatient Hospital Stay (HOSPITAL_COMMUNITY)
Admission: EM | Admit: 2019-06-30 | Discharge: 2019-07-10 | DRG: 552 | Disposition: A | Discharge status: Skilled Nursing Facility | Payer: Medicare Other | Attending: Neurological Surgery | Admitting: Neurological Surgery

## 2019-06-30 DIAGNOSIS — K209 Esophagitis, unspecified without bleeding: Secondary | ICD-10-CM | POA: Diagnosis not present

## 2019-06-30 DIAGNOSIS — M4802 Spinal stenosis, cervical region: Secondary | ICD-10-CM | POA: Diagnosis present

## 2019-06-30 DIAGNOSIS — R600 Localized edema: Secondary | ICD-10-CM | POA: Diagnosis not present

## 2019-06-30 DIAGNOSIS — G834 Cauda equina syndrome: Secondary | ICD-10-CM | POA: Diagnosis present

## 2019-06-30 DIAGNOSIS — M2550 Pain in unspecified joint: Secondary | ICD-10-CM | POA: Diagnosis not present

## 2019-06-30 DIAGNOSIS — R278 Other lack of coordination: Secondary | ICD-10-CM | POA: Diagnosis not present

## 2019-06-30 DIAGNOSIS — M4800 Spinal stenosis, site unspecified: Secondary | ICD-10-CM | POA: Diagnosis present

## 2019-06-30 DIAGNOSIS — M25461 Effusion, right knee: Secondary | ICD-10-CM | POA: Diagnosis present

## 2019-06-30 DIAGNOSIS — M6281 Muscle weakness (generalized): Secondary | ICD-10-CM | POA: Diagnosis not present

## 2019-06-30 DIAGNOSIS — R2689 Other abnormalities of gait and mobility: Secondary | ICD-10-CM | POA: Diagnosis not present

## 2019-06-30 DIAGNOSIS — K5909 Other constipation: Secondary | ICD-10-CM | POA: Diagnosis present

## 2019-06-30 DIAGNOSIS — M160 Bilateral primary osteoarthritis of hip: Secondary | ICD-10-CM | POA: Diagnosis present

## 2019-06-30 DIAGNOSIS — M5137 Other intervertebral disc degeneration, lumbosacral region: Secondary | ICD-10-CM

## 2019-06-30 DIAGNOSIS — M545 Low back pain: Secondary | ICD-10-CM | POA: Diagnosis not present

## 2019-06-30 DIAGNOSIS — M48061 Spinal stenosis, lumbar region without neurogenic claudication: Principal | ICD-10-CM | POA: Diagnosis present

## 2019-06-30 DIAGNOSIS — G8929 Other chronic pain: Secondary | ICD-10-CM | POA: Diagnosis present

## 2019-06-30 DIAGNOSIS — E876 Hypokalemia: Secondary | ICD-10-CM | POA: Diagnosis present

## 2019-06-30 DIAGNOSIS — M4804 Spinal stenosis, thoracic region: Secondary | ICD-10-CM | POA: Diagnosis present

## 2019-06-30 DIAGNOSIS — L8990 Pressure ulcer of unspecified site, unspecified stage: Secondary | ICD-10-CM | POA: Insufficient documentation

## 2019-06-30 DIAGNOSIS — I10 Essential (primary) hypertension: Secondary | ICD-10-CM | POA: Diagnosis present

## 2019-06-30 DIAGNOSIS — M25551 Pain in right hip: Secondary | ICD-10-CM | POA: Diagnosis not present

## 2019-06-30 DIAGNOSIS — Z20828 Contact with and (suspected) exposure to other viral communicable diseases: Secondary | ICD-10-CM | POA: Diagnosis present

## 2019-06-30 DIAGNOSIS — Z96642 Presence of left artificial hip joint: Secondary | ICD-10-CM | POA: Diagnosis present

## 2019-06-30 DIAGNOSIS — M48062 Spinal stenosis, lumbar region with neurogenic claudication: Secondary | ICD-10-CM | POA: Diagnosis not present

## 2019-06-30 DIAGNOSIS — K449 Diaphragmatic hernia without obstruction or gangrene: Secondary | ICD-10-CM | POA: Diagnosis present

## 2019-06-30 DIAGNOSIS — R627 Adult failure to thrive: Secondary | ICD-10-CM | POA: Diagnosis present

## 2019-06-30 DIAGNOSIS — K219 Gastro-esophageal reflux disease without esophagitis: Secondary | ICD-10-CM | POA: Diagnosis present

## 2019-06-30 DIAGNOSIS — H409 Unspecified glaucoma: Secondary | ICD-10-CM | POA: Diagnosis present

## 2019-06-30 DIAGNOSIS — D649 Anemia, unspecified: Secondary | ICD-10-CM | POA: Diagnosis not present

## 2019-06-30 DIAGNOSIS — R41841 Cognitive communication deficit: Secondary | ICD-10-CM | POA: Diagnosis not present

## 2019-06-30 DIAGNOSIS — R609 Edema, unspecified: Secondary | ICD-10-CM

## 2019-06-30 DIAGNOSIS — R0902 Hypoxemia: Secondary | ICD-10-CM | POA: Diagnosis not present

## 2019-06-30 DIAGNOSIS — D519 Vitamin B12 deficiency anemia, unspecified: Secondary | ICD-10-CM | POA: Diagnosis not present

## 2019-06-30 DIAGNOSIS — M5136 Other intervertebral disc degeneration, lumbar region: Secondary | ICD-10-CM | POA: Diagnosis not present

## 2019-06-30 DIAGNOSIS — M25561 Pain in right knee: Secondary | ICD-10-CM | POA: Diagnosis not present

## 2019-06-30 DIAGNOSIS — Z7401 Bed confinement status: Secondary | ICD-10-CM | POA: Diagnosis not present

## 2019-06-30 DIAGNOSIS — Z885 Allergy status to narcotic agent status: Secondary | ICD-10-CM | POA: Diagnosis not present

## 2019-06-30 DIAGNOSIS — I5030 Unspecified diastolic (congestive) heart failure: Secondary | ICD-10-CM | POA: Diagnosis not present

## 2019-06-30 DIAGNOSIS — M1711 Unilateral primary osteoarthritis, right knee: Secondary | ICD-10-CM | POA: Diagnosis not present

## 2019-06-30 DIAGNOSIS — E1151 Type 2 diabetes mellitus with diabetic peripheral angiopathy without gangrene: Secondary | ICD-10-CM | POA: Diagnosis present

## 2019-06-30 DIAGNOSIS — I110 Hypertensive heart disease with heart failure: Secondary | ICD-10-CM | POA: Diagnosis not present

## 2019-06-30 DIAGNOSIS — I959 Hypotension, unspecified: Secondary | ICD-10-CM | POA: Diagnosis not present

## 2019-06-30 DIAGNOSIS — M549 Dorsalgia, unspecified: Secondary | ICD-10-CM | POA: Diagnosis not present

## 2019-06-30 DIAGNOSIS — G629 Polyneuropathy, unspecified: Secondary | ICD-10-CM | POA: Diagnosis not present

## 2019-06-30 DIAGNOSIS — R52 Pain, unspecified: Secondary | ICD-10-CM | POA: Diagnosis not present

## 2019-06-30 LAB — CBC
HCT: 34.1 % — ABNORMAL LOW (ref 36.0–46.0)
Hemoglobin: 10.5 g/dL — ABNORMAL LOW (ref 12.0–15.0)
MCH: 28.4 pg (ref 26.0–34.0)
MCHC: 30.8 g/dL (ref 30.0–36.0)
MCV: 92.2 fL (ref 80.0–100.0)
Platelets: 272 10*3/uL (ref 150–400)
RBC: 3.7 MIL/uL — ABNORMAL LOW (ref 3.87–5.11)
RDW: 14.2 % (ref 11.5–15.5)
WBC: 7.2 10*3/uL (ref 4.0–10.5)
nRBC: 0 % (ref 0.0–0.2)

## 2019-06-30 LAB — BASIC METABOLIC PANEL
Anion gap: 6 (ref 5–15)
BUN: 19 mg/dL (ref 8–23)
CO2: 20 mmol/L — ABNORMAL LOW (ref 22–32)
Calcium: 7.3 mg/dL — ABNORMAL LOW (ref 8.9–10.3)
Chloride: 112 mmol/L — ABNORMAL HIGH (ref 98–111)
Creatinine, Ser: 0.72 mg/dL (ref 0.44–1.00)
GFR calc Af Amer: >60 mL/min (ref >60)
GFR calc non Af Amer: >60 mL/min (ref >60)
Glucose, Bld: 81 mg/dL (ref 70–99)
Potassium: 3.3 mmol/L — ABNORMAL LOW (ref 3.5–5.1)
Sodium: 138 mmol/L (ref 135–145)

## 2019-06-30 LAB — SARS CORONAVIRUS 2 (TAT 6-24 HRS): SARS Coronavirus 2: NEGATIVE

## 2019-06-30 MED ORDER — OXYCODONE HCL 5 MG PO TABS
10.0000 mg | ORAL_TABLET | ORAL | Status: DC | PRN
  Administered 2019-07-03 – 2019-07-10 (×5): 10 mg via ORAL
  Filled 2019-06-30 (×5): qty 2

## 2019-06-30 MED ORDER — DICLOFENAC SODIUM 1 % TD GEL: 2.0000 g | Freq: Four times a day (QID) | TRANSDERMAL | Status: DC | PRN

## 2019-06-30 MED ORDER — ONDANSETRON HCL 4 MG PO TABS
4.0000 mg | ORAL_TABLET | Freq: Four times a day (QID) | ORAL | Status: DC | PRN
  Administered 2019-06-30 – 2019-07-05 (×2): 4 mg via ORAL
  Filled 2019-06-30 (×2): qty 1

## 2019-06-30 MED ORDER — HYDROMORPHONE HCL 1 MG/ML IJ SOLN
0.5000 mg | INTRAMUSCULAR | Status: DC | PRN
  Administered 2019-06-30 – 2019-07-09 (×2): 0.5 mg via INTRAVENOUS
  Filled 2019-06-30 (×2): qty 0.5

## 2019-06-30 MED ORDER — ACETAMINOPHEN 325 MG PO TABS
650.0000 mg | ORAL_TABLET | ORAL | Status: DC | PRN
  Administered 2019-07-01 – 2019-07-08 (×6): 650 mg via ORAL
  Filled 2019-06-30 (×6): qty 2

## 2019-06-30 MED ORDER — LATANOPROST 0.005 % OP SOLN
1.0000 [drp] | Freq: Every day | OPHTHALMIC | Status: DC
  Administered 2019-06-30 – 2019-07-10 (×11): 1 [drp] via OPHTHALMIC
  Filled 2019-06-30: qty 2.5

## 2019-06-30 MED ORDER — DOCUSATE SODIUM 100 MG PO CAPS
100.0000 mg | ORAL_CAPSULE | Freq: Two times a day (BID) | ORAL | Status: DC
  Administered 2019-06-30 – 2019-07-10 (×20): 100 mg via ORAL
  Filled 2019-06-30 (×20): qty 1

## 2019-06-30 MED ORDER — SPIRONOLACTONE 25 MG PO TABS
25.0000 mg | ORAL_TABLET | Freq: Two times a day (BID) | ORAL | Status: DC
  Administered 2019-07-01 – 2019-07-10 (×20): 25 mg via ORAL
  Filled 2019-06-30 (×20): qty 1

## 2019-06-30 MED ORDER — ONDANSETRON HCL 4 MG/2ML IJ SOLN
4.0000 mg | Freq: Four times a day (QID) | INTRAMUSCULAR | Status: DC | PRN
  Administered 2019-06-30 – 2019-07-10 (×7): 4 mg via INTRAVENOUS
  Filled 2019-06-30 (×7): qty 2

## 2019-06-30 MED ORDER — LABETALOL HCL 100 MG PO TABS
100.0000 mg | ORAL_TABLET | Freq: Two times a day (BID) | ORAL | Status: DC
  Administered 2019-06-30 – 2019-07-10 (×20): 100 mg via ORAL
  Filled 2019-06-30 (×20): qty 1

## 2019-06-30 MED ORDER — DORZOLAMIDE HCL 2 % OP SOLN
1.0000 [drp] | Freq: Two times a day (BID) | OPHTHALMIC | Status: DC
  Administered 2019-06-30 – 2019-07-10 (×20): 1 [drp] via OPHTHALMIC
  Filled 2019-06-30: qty 10

## 2019-06-30 MED ORDER — PHENOL 1.4 % MT LIQD: 1.0000 | OROMUCOSAL | Status: DC | PRN

## 2019-06-30 MED ORDER — TIMOLOL MALEATE 0.5 % OP SOLN
1.0000 [drp] | Freq: Two times a day (BID) | OPHTHALMIC | Status: DC
  Administered 2019-06-30 – 2019-07-10 (×20): 1 [drp] via OPHTHALMIC
  Filled 2019-06-30: qty 5

## 2019-06-30 MED ORDER — ONDANSETRON 8 MG PO TBDP
8.0000 mg | ORAL_TABLET | Freq: Once | ORAL | Status: AC
  Administered 2019-06-30: 8 mg via ORAL
  Filled 2019-06-30: qty 1

## 2019-06-30 MED ORDER — OXYCODONE-ACETAMINOPHEN 5-325 MG PO TABS
1.0000 | ORAL_TABLET | Freq: Once | ORAL | Status: AC
  Administered 2019-06-30: 06:00:00 1 via ORAL
  Filled 2019-06-30 (×2): qty 1

## 2019-06-30 MED ORDER — OXYCODONE HCL 5 MG PO TABS
5.0000 mg | ORAL_TABLET | ORAL | Status: DC | PRN
  Administered 2019-07-01 – 2019-07-08 (×4): 5 mg via ORAL
  Filled 2019-06-30 (×4): qty 1

## 2019-06-30 MED ORDER — BRIMONIDINE TARTRATE-TIMOLOL 0.2-0.5 % OP SOLN: 1.0000 [drp] | Freq: Two times a day (BID) | OPHTHALMIC | Status: DC

## 2019-06-30 MED ORDER — POLYETHYLENE GLYCOL 3350 17 G PO PACK
17.0000 g | PACK | Freq: Every day | ORAL | Status: DC | PRN
  Administered 2019-07-03 – 2019-07-08 (×2): 17 g via ORAL
  Filled 2019-06-30 (×2): qty 1

## 2019-06-30 MED ORDER — SODIUM CHLORIDE 0.9% FLUSH: 3.0000 mL | INTRAVENOUS | Status: DC | PRN

## 2019-06-30 MED ORDER — BRIMONIDINE TARTRATE 0.2 % OP SOLN
1.0000 [drp] | Freq: Two times a day (BID) | OPHTHALMIC | Status: DC
  Administered 2019-06-30 – 2019-07-10 (×20): 1 [drp] via OPHTHALMIC
  Filled 2019-06-30: qty 5

## 2019-06-30 MED ORDER — SODIUM CHLORIDE 0.9 % IV SOLN
3.0000 g | Freq: Once | INTRAVENOUS | Status: AC
  Administered 2019-06-30: 11:00:00 3 g via INTRAVENOUS
  Filled 2019-06-30: qty 8

## 2019-06-30 MED ORDER — SODIUM CHLORIDE 0.9% FLUSH
3.0000 mL | Freq: Two times a day (BID) | INTRAVENOUS | Status: DC
  Administered 2019-06-30 – 2019-07-03 (×7): 3 mL via INTRAVENOUS
  Administered 2019-07-04: 10 mL via INTRAVENOUS
  Administered 2019-07-05 – 2019-07-10 (×11): 3 mL via INTRAVENOUS

## 2019-06-30 MED ORDER — OXYCODONE-ACETAMINOPHEN 5-325 MG PO TABS
2.0000 | ORAL_TABLET | Freq: Once | ORAL | Status: AC
  Administered 2019-06-30: 11:00:00 2 via ORAL
  Filled 2019-06-30: qty 2

## 2019-06-30 MED ORDER — ACETAMINOPHEN 650 MG RE SUPP: 650.0000 mg | RECTAL | Status: DC | PRN

## 2019-06-30 MED ORDER — SODIUM CHLORIDE 0.9 % IV SOLN: 250.0000 mL | INTRAVENOUS | Status: DC

## 2019-06-30 MED ORDER — CYCLOBENZAPRINE HCL 10 MG PO TABS
10.0000 mg | ORAL_TABLET | Freq: Three times a day (TID) | ORAL | Status: DC | PRN
  Administered 2019-06-30 – 2019-07-09 (×6): 10 mg via ORAL
  Filled 2019-06-30 (×6): qty 1

## 2019-06-30 MED ORDER — MENTHOL 3 MG MT LOZG: 1.0000 | LOZENGE | OROMUCOSAL | Status: DC | PRN

## 2019-06-30 NOTE — ED Provider Notes (Signed)
78 year old female signed out to me pending MRI of her back. She has had back pain that has worsened over the past month.  She has had increasing weakness of the right lower extremity and severe pain with movement. Patient denies any loss of bowel or bladder control.  She states that she is unable to walk due to pain.  Patient using oxycodone at home for pain.  She was referred by Dr. Criss Rosales to Dr.Ostergard Decreased strength right hip flexor MRI obtained with multiple abnormalities Discussed with Dr. Venetia Constable.  Plan transfer to neurosurgery floor at [facility] in anticipation of possible surgery this weekend. Discussed with patient and daughter and they voiced understanding   Pattricia Boss, MD 06/30/19 1130

## 2019-06-30 NOTE — ED Provider Notes (Signed)
[place] DEPT Provider Note: Georgena Spurling, MD, FACEP  CSN: [id number] MRN: [id number] ARRIVAL: 06/30/19 at 0437 ROOM: 3W30C/3W30C-01   CHIEF COMPLAINT  Back Pain and Leg Pain   HISTORY OF PRESENT ILLNESS  06/30/19 5:19 AM Brenda Kerr is a 78 y.o. female with chronic (at least several weeks) low back pain and sciatica.  She has documented severe spinal stenosis at L4-5 due to a protruding disc.  She is here with an exacerbation of pain since yesterday.  The pain is located in her mid low back radiating down the back of both legs.  She describes the pain as aching and rates it as a 10 out of 10.  She states she is able to ambulate at home if using a walker.  She denies any change in bowel or bladder function.  She denies numbness or weakness in the lower extremities but has difficulty moving them due to pain in her lower back.  She has taken acetaminophen, ibuprofen, and naproxen sodium without adequate relief.  She denies a fall or other injury causing an exacerbation of her pain.  Past Medical History:  Diagnosis Date   Arthritis    Diabetes mellitus without complication (HCC)    Dysrhythmia    GERD (gastroesophageal reflux disease)    Glaucoma    H/O hiatal hernia    Hypertension    Peripheral vascular disease (HCC)    Shortness of breath    walk a long way    Past Surgical History:  Procedure Laterality Date   ARTHROPLASTY  03/09/2012   lt hip   ESOPHAGOGASTRODUODENOSCOPY Left 01/31/2016   Procedure: ESOPHAGOGASTRODUODENOSCOPY (EGD);  Surgeon: Arta Silence, MD;  Location: [facility];  Service: Endoscopy;  Laterality: Left;   EYE SURGERY     JOINT REPLACEMENT     hip replacement   LEFT HEART CATHETERIZATION WITH CORONARY ANGIOGRAM N/A 05/29/2014   Procedure: LEFT HEART CATHETERIZATION WITH CORONARY ANGIOGRAM;  Surgeon: Laverda Page, MD;  Location: [facility] C[facility];  Service: Cardiovascular;  Laterality: N/A;   TOTAL HIP ARTHROPLASTY  03/09/2012   Procedure: TOTAL HIP ARTHROPLASTY;  Surgeon: Sharmon Revere, MD;  Location: [facility];  Service: Orthopedics;  Laterality: Left;    No family history on file.  Social History   Tobacco Use   Smoking status: Never Smoker   Smokeless tobacco: Never Used  Substance Use Topics   Alcohol use: No   Drug use: No    Prior to Admission medications   Medication Sig Start Date End Date Taking? Authorizing Provider  acetaminophen (TYLENOL) 500 MG tablet Take 500 mg by mouth every 6 (six) hours as needed for mild pain.   Yes [provider]  bimatoprost (LUMIGAN) 0.01 % SOLN Place 1 drop into both eyes at bedtime.     Yes [provider]  brimonidine-timolol (COMBIGAN) 0.2-0.5 % ophthalmic solution Place 1 drop into both eyes every 12 (twelve) hours.     Yes [provider]  diclofenac sodium (VOLTAREN) 1 % GEL Apply 2 g topically 4 (four) times daily as needed for pain. 05/16/19  Yes [provider]  dorzolamide (TRUSOPT) 2 % ophthalmic solution Place 1 drop into both eyes 2 (two) times daily.   Yes [provider]  ibuprofen (ADVIL) 200 MG tablet Take 400 mg by mouth every 6 (six) hours as needed for fever or moderate pain.   Yes [provider]  labetalol (NORMODYNE) 100 MG tablet Take 100 mg by mouth 2 (two) times  daily. 11/22/17  Yes [provider]  naproxen sodium (ALEVE) 220 MG tablet Take 220 mg by mouth 2 (two) times daily as needed (pain).   Yes [provider]  ondansetron (ZOFRAN ODT) 4 MG disintegrating tablet Take 1 tablet (4 mg total) by mouth every 8 (eight) hours as needed. 05/23/19  Yes Isla Pence, MD  spironolactone (ALDACTONE) 25 MG tablet Take 25 mg by mouth 2 (two) times daily. 11/22/17  Yes [provider]    Allergies Codeine   REVIEW OF SYSTEMS  Negative except as noted here or in the History of Present Illness.   PHYSICAL EXAMINATION  Initial Vital Signs Blood pressure 126/61, pulse  74, temperature 98.2 F (36.8 C), temperature source Oral, resp. rate 18, height 5\' 2"  (1.575 m), weight 68.9 kg, SpO2 100 %.  Examination General: Well-developed, well-nourished female in no acute distress; appearance consistent with age of record HENT: normocephalic; atraumatic Eyes: Right pupil round and reactive to light, left pupil irregular; extraocular muscles intact Neck: supple Heart: regular rate and rhythm Lungs: clear to auscultation bilaterally Abdomen: soft; nondistended; nontender; bowel sounds present Back: Mild mid lumbar tenderness; positive straight leg raise bilaterally at about 5 degrees Extremities: No deformity; full range of motion; pulses normal Neurologic: Awake, alert and oriented; motor function intact in all extremities and symmetric but examination limited in lower extremities due to pain on attempted movement at hips; sensation intact in lower extremities and symmetric; no facial droop Skin: Warm and dry Psychiatric: Normal mood and affect   RESULTS  Summary of this visit's results, reviewed and interpreted by myself:   EKG Interpretation  Date/Time:    Ventricular Rate:    PR Interval:    QRS Duration:   QT Interval:    QTC Calculation:   R Axis:     Text Interpretation:        Laboratory Studies: Results for orders placed or performed during the hospital encounter of 06/30/19 (from the past 24 hour(s))  CBC     Status: Abnormal   Collection Time: 06/30/19 11:31 AM  Result Value Ref Range   WBC 7.2 4.0 - 10.5 K/uL   RBC 3.70 (L) 3.87 - 5.11 MIL/uL   Hemoglobin 10.5 (L) 12.0 - 15.0 g/dL   HCT 34.1 (L) 36.0 - 46.0 %   MCV 92.2 80.0 - 100.0 fL   MCH 28.4 26.0 - 34.0 pg   MCHC 30.8 30.0 - 36.0 g/dL   RDW 14.2 11.5 - 15.5 %   Platelets 272 150 - 400 K/uL   nRBC 0.0 0.0 - 0.2 %  Basic metabolic panel     Status: Abnormal   Collection Time: 06/30/19 11:31 AM  Result Value Ref Range   Sodium 138 135 - 145 mmol/L   Potassium 3.3 (L) 3.5 -  5.1 mmol/L   Chloride 112 (H) 98 - 111 mmol/L   CO2 20 (L) 22 - 32 mmol/L   Glucose, Bld 81 70 - 99 mg/dL   BUN 19 8 - 23 mg/dL   Creatinine, Ser 0.72 0.44 - 1.00 mg/dL   Calcium 7.3 (L) 8.9 - 10.3 mg/dL   GFR calc non Af Amer >60 >60 mL/min   GFR calc Af Amer >60 >60 mL/min   Anion gap 6 5 - 15  SARS CORONAVIRUS 2 (TAT 6-24 HRS) Nasopharyngeal Nasopharyngeal Swab     Status: None   Collection Time: 06/30/19  5:13 PM   Specimen: Nasopharyngeal Swab  Result Value Ref Range  SARS Coronavirus 2 NEGATIVE NEGATIVE   Imaging Studies: Mr Lumbar Spine Wo Contrast  Result Date: 06/30/2019 CLINICAL DATA:  Acute presentation with bilateral leg pain and low back pain worsening recently. EXAM: MRI LUMBAR SPINE WITHOUT CONTRAST TECHNIQUE: Multiplanar, multisequence MR imaging of the lumbar spine was performed. No intravenous contrast was administered. COMPARISON:  Radiography 09/25/2015 FINDINGS: Segmentation:  5 lumbar type vertebral bodies assumed. Alignment: 2 mm retrolisthesis L2-3. 2 mm anterolisthesis L3-4. 3 mm anterolisthesis L4-5. Vertebrae: No recent fracture or primary bone lesion. Chronic discogenic endplate changes and superior endplate Schmorl's node at L2. Conus medullaris and cauda equina: Conus extends to the L1 level. Conus and cauda equina appear normal. Paraspinal and other soft tissues: Right renal cyst. Disc levels: T11-12 and T12-L1 were not studied in the axial plane. There shallow disc protrusions in there is facet hypertrophy at those levels. There is canal narrowing with effacement of the subarachnoid space but no visible cord compression. L1-2: Shallow disc protrusion. Mild facet and ligamentous hypertrophy. Mild stenosis of both lateral recesses but no visible neural compression. L2-3: 2 mm retrolisthesis. Disc degeneration with a shallow disc herniation in the left posterolateral direction. Facet and ligamentous hypertrophy. Stenosis of the left lateral recess and intervertebral  foramen on the left that could cause neural compression. Mild right-sided lateral recess and foraminal narrowing. L3-4: 2 mm anterolisthesis. Circumferential protrusion of the disc. Facet and ligamentous hypertrophy. Severe multifactorial spinal stenosis that could cause neural compression on either or both sides. L4-5: 3 mm of anterolisthesis. Severe bilateral facet arthropathy with facet and ligamentous hypertrophy. Broad-based herniation the disc. Severe spinal stenosis likely to cause neural compression on both sides. Bilateral foraminal stenosis. L5-S1: Bulging of the disc. Facet and ligamentous hypertrophy worse on the right. No compressive canal stenosis. Bilateral foraminal narrowing right more than left with some potential to affect in particular the right L5 nerve. IMPRESSION: 1. Severe multifactorial spinal stenosis worse at L4-5 than L3-4 that could cause neural compression on either or both sides. 2. Widespread lesser but potentially significant degenerative changes at other levels as outlined above. Electronically Signed   By: Nelson Chimes M.D.   On: 06/30/2019 08:35   Dg Chest Portable 1 View  Result Date: 06/30/2019 CLINICAL DATA:  Preoperative examination (back surgery). History of diabetes and hypertension. EXAM: PORTABLE CHEST 1 VIEW COMPARISON:  01/27/2016 FINDINGS: Grossly unchanged cardiac silhouette and mediastinal contours with atherosclerotic plaque within the thoracic aorta. No focal airspace opacities. No pleural effusion or pneumothorax. No evidence of edema. There is a minimal amount of pleuroparenchymal thickening about the right minor fissure. No definite acute osseous abnormalities. IMPRESSION: No acute cardiopulmonary disease on this AP portable examination. Further evaluation with a PA and lateral chest radiograph may be obtained as clinically indicated. Electronically Signed   By: Sandi Mariscal M.D.   On: 06/30/2019 11:55    ED COURSE and MDM  Nursing notes, initial and  subsequent vitals signs, including pulse oximetry, reviewed and interpreted by myself.  Vitals:   06/30/19 2123 07/01/19 0000 07/01/19 0332 07/01/19 0903  BP: 128/68 129/66 129/67 (!) 130/55  Pulse: 80 81 88 65  Resp: 18 16 16 17   Temp: 98.2 F (36.8 C) 98 F (36.7 C) 98 F (36.7 C) 97.7 F (36.5 C)  TempSrc: Oral Oral Oral Oral  SpO2: 100% 100% 100% 100%  Weight: 67.8 kg     Height:       6:16 AM We will obtain MRI of the lumbar spine.  7:00  AM Signed out to Dr. Jeanell Sparrow. MRI pending.   PROCEDURES  Procedures   ED DIAGNOSES     ICD-10-CM   1. Spinal stenosis of lumbar region, unspecified whether neurogenic claudication present  M48.061   2. Degeneration of lumbar or lumbosacral intervertebral disc  M51.37        Yola Paradiso, MD 07/01/19 1052

## 2019-06-30 NOTE — ED Notes (Signed)
Pt reports no nausea at this time

## 2019-06-30 NOTE — ED Notes (Signed)
Pt called out for nausea. Zofran given this RN will reevaluate.

## 2019-06-30 NOTE — H&P (Signed)
Neurosurgery H&P  CC: Back and hip pain  HPI: This is a 78 y.o. woman that presents with bilateral hip and back pain. I saw her in clinic earlier this week for some hyperreflexia with generalized weakness and concern for some changes in urinary continence. She now presents to the ED with worsening hip and back pain. The hip pain is worst when she lays on either hip, so she lays on her back to decrease the hip pain, which hurts her back. She has tenderness and pain in the bilateral hips and knees. She has no new numbness, no subjective weakness, no recent change in bowel and bladder function. On further discussion today, she feels that she has normal sensation and control of her bowel/bladder, but due to pain she can't get to the bathroom in time. On discussion with her daughter, her mother expressed some concern about shortness of breath and, combined with her pain, this is why she called EMS.  ROS: A 14 point ROS was performed and is negative except as noted in the HPI.   PMHx:  Past Medical History:  Diagnosis Date  . Arthritis   . Diabetes mellitus without complication ([place])   . Dysrhythmia   . GERD (gastroesophageal reflux disease)   . Glaucoma   . H/O hiatal hernia   . Hypertension   . Peripheral vascular disease ([place])   . Shortness of breath    walk a long way   FamHx: No family history on file. SocHx:  reports that she has never smoked. She has never used smokeless tobacco. She reports that she does not drink alcohol or use drugs.  Exam: Vital signs in last 24 hours: Temp:  [98.2 F (36.8 C)] 98.2 F (36.8 C) (10/30 0455) Pulse Rate:  [56-81] 81 (10/30 1830) Resp:  [13-23] 23 (10/30 1830) BP: (102-161)/(44-81) 142/73 (10/30 1830) SpO2:  [97 %-100 %] 99 % (10/30 1830) Weight:  [68.9 kg] 68.9 kg (10/30 0456) General: Awake, alert, cooperative, lying in bed, comfortable in bed until she moves Head: normocephalic and atruamatic HEENT: neck supple Pulmonary: breathing room air  comfortably, no evidence of increased work of breathing Cardiac: RRR Abdomen: S NT ND Extremities: warm and well perfused x4 Neuro: AOx3, PERRL, EOMI, FS Strength 5/5 x4 except pain limited at the hips and knees, SILTx4 Severe pain recreated with internal and external rotation of the hips bilaterally, point tenderness over the bilateral greater trochanters  Assessment and Plan: 78 y.o. woman with severe bilateral hip and back pain. MRI C/T/L-spine personally reviewed, which shows multiple areas of stenosis and severe degenerative disease. In the lumbar spine, it is worst at L3-4 and L4-5. In the thoracic spine, there is some moderate stenosis at T10-11 and T11-12. In the cervical spine, there is moderate to moderately severe stenosis at C3-4.    -Originally consulted for concern for cauda equina syndrome given the severe back and leg pain in the setting of urinary incontinence. However, on exam, she has no numbness or weakness, no new urinary retention or bowel incontinence. Her exam is consistent with a severe bilateral hip arthropathy. She has no findings of cauda equina syndrome. -accepted [facility] given the concern for cauda equina syndrome and the inability to ambulate, thus will require inpatient treatment. Will consult medicine regarding further management  Judith Part, MD 06/30/19 7:18 PM [facility]

## 2019-06-30 NOTE — ED Triage Notes (Addendum)
Per EMS, patient coming from home with bilateral leg pain and lower back pain that started a couple of weeks ago. Patient states that pain increased around 0330 and was not relieved with home medications. Patient is ambulatory with walker at baseline but has not been able to walk for the past couple of weeks due to pain.

## 2019-06-30 NOTE — Progress Notes (Signed)
Pt admitted to the unit from [facility]. Pt alert and verbally responsive; pt oriented to the unit and room; fall/safety precaution and prevention education completed with pt. purwick applied; bed alarm on; skin clean, dry and intact with no pressure ulcer or opened wounds noted. Call light within reach; will continue to closely monitor pt. Delia Heady RN   06/30/19 2123  Vital Signs  BP 128/68  BP Location Left Arm  Patient Position (if appropriate) Lying  BP Method Automatic  Pulse Rate 80  Pulse Rate Source Monitor  Resp 18  Temp 98.2 F (36.8 C)  Temp Source Oral  Oxygen Therapy  SpO2 100 %  O2 Device Room Air  Height and Weight  Weight 67.8 kg  BMI (Calculated) 27.33

## 2019-07-01 NOTE — Progress Notes (Signed)
Patient ID: Brenda Kerr, female   DOB: 05/27/1941, 78 y.o.   MRN: [id number] BP (!) 130/55 (BP Location: Left Arm)   Pulse 65   Temp 97.7 F (36.5 C) (Oral)   Resp 17   Ht 5\' 2"  (1.575 m)   Wt 67.8 kg   SpO2 100%   BMI 27.34 kg/m  Alert, weak in her hands, poor grip Pain with passive hip flexion No changes since admission Hip films from 9/22 look good, may consider an orthopaedic evaluation.

## 2019-07-02 MED ORDER — WHITE PETROLATUM EX OINT
TOPICAL_OINTMENT | CUTANEOUS | Status: AC
  Administered 2019-07-02: 0.2
  Filled 2019-07-02: qty 28.35

## 2019-07-02 NOTE — Progress Notes (Signed)
Patient's husband [name] would like MD to call patient's daughter [name] at [phone] or [name] at [phone] when discussing plans for surgery tomorrow 07/03/19. Nurse will relay message to oncoming nurse.  Gwendolyn Grant, RN

## 2019-07-03 ENCOUNTER — Inpatient Hospital Stay (HOSPITAL_COMMUNITY): Payer: Medicare Other

## 2019-07-03 DIAGNOSIS — I10 Essential (primary) hypertension: Secondary | ICD-10-CM

## 2019-07-03 DIAGNOSIS — D649 Anemia, unspecified: Secondary | ICD-10-CM

## 2019-07-03 DIAGNOSIS — K209 Esophagitis, unspecified without bleeding: Secondary | ICD-10-CM | POA: Diagnosis present

## 2019-07-03 DIAGNOSIS — M25561 Pain in right knee: Secondary | ICD-10-CM

## 2019-07-03 DIAGNOSIS — G834 Cauda equina syndrome: Secondary | ICD-10-CM

## 2019-07-03 DIAGNOSIS — M25461 Effusion, right knee: Secondary | ICD-10-CM

## 2019-07-03 DIAGNOSIS — M48062 Spinal stenosis, lumbar region with neurogenic claudication: Secondary | ICD-10-CM

## 2019-07-03 DIAGNOSIS — E876 Hypokalemia: Secondary | ICD-10-CM | POA: Diagnosis present

## 2019-07-03 DIAGNOSIS — M4800 Spinal stenosis, site unspecified: Secondary | ICD-10-CM | POA: Diagnosis present

## 2019-07-03 LAB — IRON AND TIBC
Iron: 45 ug/dL (ref 28–170)
Saturation Ratios: 13 % (ref 10.4–31.8)
TIBC: 349 ug/dL (ref 250–450)
UIBC: 304 ug/dL

## 2019-07-03 LAB — URIC ACID: Uric Acid, Serum: 4.9 mg/dL (ref 2.5–7.1)

## 2019-07-03 LAB — TSH: TSH: 0.276 u[IU]/mL — ABNORMAL LOW (ref 0.350–4.500)

## 2019-07-03 LAB — C-REACTIVE PROTEIN: CRP: 0.9 mg/dL (ref <1.0)

## 2019-07-03 LAB — FERRITIN: Ferritin: 48 ng/mL (ref 11–307)

## 2019-07-03 LAB — SEDIMENTATION RATE: Sed Rate: 40 mm/hr — ABNORMAL HIGH (ref 0–22)

## 2019-07-03 MED ORDER — POTASSIUM CHLORIDE 20 MEQ PO PACK
40.0000 meq | PACK | Freq: Once | ORAL | Status: AC
  Administered 2019-07-03: 40 meq via ORAL
  Filled 2019-07-03: qty 2

## 2019-07-03 MED ORDER — PANTOPRAZOLE SODIUM 40 MG PO TBEC
40.0000 mg | DELAYED_RELEASE_TABLET | Freq: Every day | ORAL | Status: DC
  Administered 2019-07-03 – 2019-07-10 (×8): 40 mg via ORAL
  Filled 2019-07-03 (×8): qty 1

## 2019-07-03 NOTE — Progress Notes (Signed)
Neurosurgery Service Progress Note  Subjective: No acute events overnight, still having significant hip pain preventing ambulation   Objective: Vitals:   07/02/19 2011 07/02/19 2347 07/03/19 0356 07/03/19 0841  BP: (!) 146/69 (!) 127/58 (!) 147/65 139/60  Pulse: 79 66 (!) 57 80  Resp: 20 18 18 16   Temp: 98 F (36.7 C) 98.1 F (36.7 C) 98.5 F (36.9 C) 97.9 F (36.6 C)  TempSrc: Oral Oral Oral Axillary  SpO2: 100% 100% 97% 100%  Weight:      Height:       Temp (24hrs), Avg:98.1 F (36.7 C), Min:97.9 F (36.6 C), Max:98.5 F (36.9 C)  CBC Latest Ref Rng & Units 06/30/2019 05/23/2019 11/25/2017  WBC 4.0 - 10.5 K/uL 7.2 7.9 6.7  Hemoglobin 12.0 - 15.0 g/dL 10.5(L) 12.9 12.6  Hematocrit 36.0 - 46.0 % 34.1(L) 40.9 40.1  Platelets 150 - 400 K/uL 272 256 232   BMP Latest Ref Rng & Units 06/30/2019 05/23/2019 11/25/2017  Glucose 70 - 99 mg/dL 81 99 93  BUN 8 - 23 mg/dL 19 20 23(H)  Creatinine 0.44 - 1.00 mg/dL 0.72 0.94 1.04(H)  Sodium 135 - 145 mmol/L 138 140 140  Potassium 3.5 - 5.1 mmol/L 3.3(L) 4.1 4.1  Chloride 98 - 111 mmol/L 112(H) 107 106  CO2 22 - 32 mmol/L 20(L) 23 23  Calcium 8.9 - 10.3 mg/dL 7.3(L) 9.9 9.7    Intake/Output Summary (Last 24 hours) at 07/03/2019 1132 Last data filed at 07/03/2019 0900 Gross per 24 hour  Intake 260 ml  Output 950 ml  Net -690 ml    Current Facility-Administered Medications:  .  0.9 %  sodium chloride infusion, 250 mL, Intravenous, Continuous, Shalynn Jorstad, Joyice Faster, MD .  acetaminophen (TYLENOL) tablet 650 mg, 650 mg, Oral, Q4H PRN, 650 mg at 07/02/19 2100 **OR** acetaminophen (TYLENOL) suppository 650 mg, 650 mg, Rectal, Q4H PRN, Judith Part, MD .  brimonidine (ALPHAGAN) 0.2 % ophthalmic solution 1 drop, 1 drop, Both Eyes, Q12H, 1 drop at 07/03/19 0926 **AND** timolol (TIMOPTIC) 0.5 % ophthalmic solution 1 drop, 1 drop, Both Eyes, Q12H, Taimi Towe, Joyice Faster, MD, 1 drop at 07/03/19 0926 .  cyclobenzaprine (FLEXERIL) tablet 10 mg,  10 mg, Oral, TID PRN, Judith Part, MD, 10 mg at 07/02/19 2100 .  diclofenac sodium (VOLTAREN) 1 % transdermal gel 2 g, 2 g, Topical, QID PRN, Judith Part, MD .  docusate sodium (COLACE) capsule 100 mg, 100 mg, Oral, BID, Judith Part, MD, 100 mg at 07/03/19 0926 .  dorzolamide (TRUSOPT) 2 % ophthalmic solution 1 drop, 1 drop, Both Eyes, BID, Macaiah Mangal, Joyice Faster, MD, 1 drop at 07/03/19 0926 .  HYDROmorphone (DILAUDID) injection 0.5 mg, 0.5 mg, Intravenous, Q3H PRN, Judith Part, MD, 0.5 mg at 06/30/19 2251 .  labetalol (NORMODYNE) tablet 100 mg, 100 mg, Oral, BID, Judith Part, MD, 100 mg at 07/03/19 0926 .  latanoprost (XALATAN) 0.005 % ophthalmic solution 1 drop, 1 drop, Both Eyes, QHS, Pattiann Solanki A, MD, 1 drop at 07/02/19 2102 .  menthol-cetylpyridinium (CEPACOL) lozenge 3 mg, 1 lozenge, Oral, PRN **OR** phenol (CHLORASEPTIC) mouth spray 1 spray, 1 spray, Mouth/Throat, PRN, Judith Part, MD .  ondansetron (ZOFRAN) tablet 4 mg, 4 mg, Oral, Q6H PRN, 4 mg at 06/30/19 1928 **OR** ondansetron (ZOFRAN) injection 4 mg, 4 mg, Intravenous, Q6H PRN, Judith Part, MD, 4 mg at 07/01/19 2112 .  oxyCODONE (Oxy IR/ROXICODONE) immediate release tablet 10 mg, 10 mg, Oral, Q4H  PRN, Judith Part, MD .  oxyCODONE (Oxy IR/ROXICODONE) immediate release tablet 5 mg, 5 mg, Oral, Q4H PRN, Judith Part, MD, 5 mg at 07/02/19 2101 .  polyethylene glycol (MIRALAX / GLYCOLAX) packet 17 g, 17 g, Oral, Daily PRN, Willia Genrich A, MD .  sodium chloride flush (NS) 0.9 % injection 3 mL, 3 mL, Intravenous, Q12H, Vidal Lampkins, Joyice Faster, MD, 3 mL at 07/03/19 0927 .  sodium chloride flush (NS) 0.9 % injection 3 mL, 3 mL, Intravenous, PRN, Judith Part, MD .  spironolactone (ALDACTONE) tablet 25 mg, 25 mg, Oral, BID, Jenina Moening, Joyice Faster, MD, 25 mg at 07/03/19 [id number]   Physical Exam: AOx1, PERRL, EOMI, FS, Strength 5/5 pain-limited x4, SILTx4 except for  stocking-glove numbness in all 4 extremities to the ankle and fingers, equally across all 5 fingers Painful active/passive ROM in b/l hips, also tender in b/l knees, b/l ankles Mildly increased tone in BUE, no hoffman's but difficult to test 2/2 trigger finger on R & finger injury on L, reflexes 1+,    Assessment & Plan: 78 y.o. woman transferred for concern for cauda equina syndrome, no complaints c/w or exam evidence of cauda equina. Main complaint is foot numbness and b/l hip pain.  -will have to get EMG/NCV as outpatient for foot/hand numbness, no acute neurosurgical intervention indicated -medicine c/s for failure to thrive, polyarthropathy / pain syndrome, R knee swelling -ortho c/s for bilateral hip pain -PT/OT   Joyice Faster Delcia Spitzley  07/03/19 11:32 AM

## 2019-07-03 NOTE — Evaluation (Signed)
Physical Therapy Evaluation Patient Details Name: Brenda Kerr MRN: [id number] DOB: August 25, 1941 Today's Date: 07/03/2019   History of Present Illness   78 y.o. woman with DM, PVD, B THA, glaucoma, GERD, and HTN who presents with severe bilateral hip and back pain. MRI C/T/L  shows multiple areas of stenosis and severe degenerative disease. In the lumbar spine, it is worst at L3-4 and L4-5. In the thoracic spine, there is some moderate stenosis at T10-11 and T11-12. In the cervical spine, there is moderate to moderately severe stenosis at C3-4. Original concern for cauda equina but that has been ruled out.   Clinical Impression  Pt admitted with above diagnosis. Pt presents with extreme weakness of extremities as well as core musculature, having been in bed past 2-3 weeks and unable to walk past several months. Max A to get to EOB with mod A to maintain sitting upright due to posterior lean and quick fatigue. Pt did not have back or hip pain on eval but did have R knee pain with flexion in sitting. LE strength <2/5 Bilaterally with inability to stand or ambulate.  Pt currently with functional limitations due to the deficits listed below (see PT Problem List). Pt will benefit from skilled PT to increase their independence and safety with mobility to allow discharge to the venue listed below.       Follow Up Recommendations SNF;Supervision/Assistance - 24 hour    Equipment Recommendations  Other (comment)(TBD)    Recommendations for Other Services       Precautions / Restrictions Precautions Precautions: Fall Precaution Comments: L eye blind Restrictions Weight Bearing Restrictions: No      Mobility  Bed Mobility Overal bed mobility: Needs Assistance Bed Mobility: Supine to Sit;Sit to Supine     Supine to sit: Max assist Sit to supine: Max assist   General bed mobility comments: pt able to partially grasp L rail with R hand to pull up but max A needed for LE's off bed and  elevation of trunk to sitting. Completelt dependent for return to supine, unable to lift LE's. +2 tot A for scooting up in bed  Transfers Overall transfer level: Needs assistance   Transfers: Lateral/Scoot Transfers          Lateral/Scoot Transfers: Total assist General transfer comment: attempted to scoot L EOB but pt was tot A, unable to push through floor with feet to scoot or effectively use UE's  Ambulation/Gait             General Gait Details: unable  Stairs            Wheelchair Mobility    Modified Rankin (Stroke Patients Only)       Balance Overall balance assessment: Needs assistance Sitting-balance support: Feet supported Sitting balance-Leahy Scale: Poor Sitting balance - Comments: needed mod A to maintain sitting with a couple 10 sec bouts of min-guard but pt fatigued very quickly and leaned posteriorly Postural control: Posterior lean     Standing balance comment: unable                             Pertinent Vitals/Pain Pain Assessment: Faces Faces Pain Scale: Hurts little more Pain Location: R knee Pain Descriptors / Indicators: Aching Pain Intervention(s): Monitored during session    Home Living Family/patient expects to be discharged to:: Private residence Living Arrangements: Spouse/significant other Available Help at Discharge: Family;Available 24 hours/day Type of Home: House Home Access: Stairs  to enter Entrance Stairs-Rails: Right Entrance Stairs-Number of Steps: 4 Home Layout: One level Home Equipment: [place] - 2 wheels;Cane - single point;Shower seat      Prior Function Level of Independence: Needs assistance   Gait / Transfers Assistance Needed: pt reports she has not ambulated in 2-3 months but has only sat EOB past 2-3 weeks  ADL's / Homemaking Assistance Needed: needs assist for all ADL's        Hand Dominance   Dominant Hand: Right    Extremity/Trunk Assessment   Upper Extremity  Assessment Upper Extremity Assessment: Defer to OT evaluation;Generalized weakness    Lower Extremity Assessment Lower Extremity Assessment: Generalized weakness;RLE deficits/detail;LLE deficits/detail RLE Deficits / Details: c/o R knee pain with flexion, knee ext 2/5, hip flex 2-/5, ankle 2/5 RLE Sensation: WNL RLE Coordination: decreased gross motor LLE Deficits / Details: hip flex 2-/5, knee ext 2/5, ankle 2/5 LLE Sensation: WNL LLE Coordination: decreased gross motor    Cervical / Trunk Assessment Cervical / Trunk Assessment: Kyphotic  Communication   Communication: No difficulties  Cognition Arousal/Alertness: Awake/alert Behavior During Therapy: WFL for tasks assessed/performed Overall Cognitive Status: Within Functional Limits for tasks assessed                                 General Comments: WFL for basic history      General Comments General comments (skin integrity, edema, etc.): discussed ST SNF with pt and she feels that she needs rehab to get stronger    Exercises General Exercises - Lower Extremity Ankle Circles/Pumps: AROM;Both;10 reps;Seated   Assessment/Plan    PT Assessment Patient needs continued PT services  PT Problem List Decreased strength;Decreased activity tolerance;Decreased range of motion;Decreased balance;Decreased mobility;Decreased coordination;Decreased knowledge of precautions;Obesity;Pain       PT Treatment Interventions DME instruction;Gait training;Functional mobility training;Therapeutic activities;Therapeutic exercise;Balance training;Neuromuscular re-education;Patient/family education    PT Goals (Current goals can be found in the Care Plan section)  Acute Rehab PT Goals Patient Stated Goal: get stronger PT Goal Formulation: With patient Time For Goal Achievement: 07/17/19 Potential to Achieve Goals: Fair    Frequency Min 2X/week   Barriers to discharge        Co-evaluation               AM-PAC PT  "6 Clicks" Mobility  Outcome Measure Help needed turning from your back to your side while in a flat bed without using bedrails?: A Lot Help needed moving from lying on your back to sitting on the side of a flat bed without using bedrails?: A Lot Help needed moving to and from a bed to a chair (including a wheelchair)?: Total Help needed standing up from a chair using your arms (e.g., wheelchair or bedside chair)?: Total Help needed to walk in hospital room?: Total Help needed climbing 3-5 steps with a railing? : Total 6 Click Score: 8    End of Session   Activity Tolerance: Patient tolerated treatment well Patient left: in bed;with call bell/phone within reach;with bed alarm set;with family/visitor present;with nursing/sitter in room Nurse Communication: Mobility status PT Visit Diagnosis: Muscle weakness (generalized) (M62.81);Pain;Difficulty in walking, not elsewhere classified (R26.2) Pain - Right/Left: Right Pain - part of body: Knee    Time: [id number] PT Time Calculation (min) (ACUTE ONLY): 23 min   Charges:   PT Evaluation $PT Eval Moderate Complexity: 1 Mod PT Treatments $Therapeutic Activity: 8-22 mins  Leighton Roach, PT  Acute Rehab Services  Pager [phone] Office [place] 07/03/2019, 1:30 PM

## 2019-07-03 NOTE — Consult Note (Signed)
Medical Consultation   Brenda Kerr  [id number]  DOB: 11-16-1940  DOA: 06/30/2019  PCP: Lucianne Lei, MD   Requesting physician: Dr. Zada Finders  Reason for consultation: Pain in multiple joints, failure to thrive, right knee swelling and pain.   History of Present Illness: Brenda Kerr is an 78 y.o. female hypertension, arthritis, chronic constipation, severe spinal stenosis, NSAID induced esophagitis admitted for the concern of cauda equina syndrome.  Patient was evaluated by neurosurgery-no surgical intervention required.  Patient continues to have back pain, bilateral hip pain, leg pain, feet pain since couple of weeks to 1 month.  Patient reports right knee swelling since 2 to 3 weeks.  Denies association with redness, fever, chills, history of gout/fibromyalgia, trauma.  She is on Dilaudid, oxycodone and Flexeril as needed for pain.  Physical therapy helps patient with mobilization.  I talked to patient's daughter [name] over the phone who reports that patient has chronic joint  pain form couple of weeks which is progressively getting worse.  She has decrease mobility due to weakness in bilateral lower extremities.  Patient has decreased appetite and reported weight loss? however denies recent stress/depression, vomiting, diarrhea, night sweats.  Has chronic constipation and nausea but no history of headache, blurry vision, chest pain, shortness of breath, palpitation, leg swelling.  Has history of grade C esophagitis secondary to NSAID induced.  Denies history of melena or hematemesis or epigastric pain.  Upon my evaluation: Patient working with physical therapy.  Reports that she is doing better, has right knee pain and swelling, bilateral hip pain and bilateral leg weakness denies association with recent diarrhea, viral infection, urinary or bowel incontinence.  [facility] hospitalist consulted for the concern of polyarthropathy, right knee pain and swelling and failure  to thrive.  Ortho also consulted by neurosurgery for the concern of bilateral hip pain.   Past Medical History:  Diagnosis Date  . Arthritis   . Diabetes mellitus without complication ([place])   . Dysrhythmia   . GERD (gastroesophageal reflux disease)   . Glaucoma   . H/O hiatal hernia   . Hypertension   . Peripheral vascular disease ([place])   . Shortness of breath    walk a long way         Review of Systems:  ROS As per HPI otherwise 10 point review of systems negative.     Past Medical History: Past Medical History:  Diagnosis Date  . Arthritis   . Diabetes mellitus without complication ([place])   . Dysrhythmia   . GERD (gastroesophageal reflux disease)   . Glaucoma   . H/O hiatal hernia   . Hypertension   . Peripheral vascular disease ([place])   . Shortness of breath    walk a long way    Past Surgical History: Past Surgical History:  Procedure Laterality Date  . ARTHROPLASTY  03/09/2012   lt hip  . ESOPHAGOGASTRODUODENOSCOPY Left 01/31/2016   Procedure: ESOPHAGOGASTRODUODENOSCOPY (EGD);  Surgeon: Arta Silence, MD;  Location: [facility];  Service: Endoscopy;  Laterality: Left;  . EYE SURGERY    . JOINT REPLACEMENT     hip replacement  . LEFT HEART CATHETERIZATION WITH CORONARY ANGIOGRAM N/A 05/29/2014   Procedure: LEFT HEART CATHETERIZATION WITH CORONARY ANGIOGRAM;  Surgeon: Laverda Page, MD;  Location: [facility] CATH LAB;  Service: Cardiovascular;  Laterality: N/A;  . TOTAL HIP ARTHROPLASTY  03/09/2012   Procedure: TOTAL HIP ARTHROPLASTY;  Surgeon:  Sharmon Revere, MD;  Location: [facility];  Service: Orthopedics;  Laterality: Left;     Allergies:   Allergies  Allergen Reactions  . Codeine Nausea And Vomiting     Social History:  reports that she has never smoked. She has never used smokeless tobacco. She reports that she does not drink alcohol or use drugs.   Family History: No family history on file.    Physical Exam: Vitals:   07/02/19 2347 07/03/19  0356 07/03/19 0841 07/03/19 1206  BP: (!) 127/58 (!) 147/65 139/60 124/60  Pulse: 66 (!) 57 80 60  Resp: 18 18 16 16   Temp: 98.1 F (36.7 C) 98.5 F (36.9 C) 97.9 F (36.6 C) 97.8 F (36.6 C)  TempSrc: Oral Oral Axillary Oral  SpO2: 100% 97% 100% 99%  Weight:      Height:        Constitutional: Appearance,  Alert and awake, oriented x3, not in any acute distress. Eyes: PERLA, EOMI, irises appear normal, anicteric sclera,  ENMT: external ears and nose appear normal, normal hearing or hard of hearing            Lips appears normal, oropharynx mucosa, tongue, posterior pharynx appear normal  Neck: neck appears normal, no masses, normal ROM, no thyromegaly, no JVD  CVS: S1-S2 clear, no murmur rubs or gallops, no LE edema, normal pedal pulses  Respiratory:  clear to auscultation bilaterally, no wheezing, rales or rhonchi. Respiratory effort normal. No accessory muscle use.  Abdomen: soft nontender, nondistended, normal bowel sounds, no hepatosplenomegaly, no hernias  Musculoskeletal: : no cyanosis, clubbing or edema noted bilaterally                       Joint/bones/muscle exam, strength, contractures or atrophy Neuro: Cranial nerves II-XII intact, power in bilateral lower extremities: 2 out of 5, 5 out of 5 power in bilateral upper extremities.  Sensation intact.   Psych: judgement and insight appear normal, stable mood and affect, mental status Skin: no rashes or lesions or ulcers, no induration or nodules    Data reviewed:  I have personally reviewed following labs and imaging studies Labs:  CBC: Recent Labs  Lab 06/30/19 1131  WBC 7.2  HGB 10.5*  HCT 34.1*  MCV 92.2  PLT [id number]    Basic Metabolic Panel: Recent Labs  Lab 06/30/19 1131  NA 138  K 3.3*  CL 112*  CO2 20*  GLUCOSE 81  BUN 19  CREATININE 0.72  CALCIUM 7.3*   GFR Estimated Creatinine Clearance: 51.5 mL/min (by C-G formula based on SCr of 0.72 mg/dL). Liver Function Tests: No results for input(s): AST,  ALT, ALKPHOS, BILITOT, PROT, ALBUMIN in the last 168 hours. No results for input(s): LIPASE, AMYLASE in the last 168 hours. No results for input(s): AMMONIA in the last 168 hours. Coagulation profile No results for input(s): INR, PROTIME in the last 168 hours.  Cardiac Enzymes: No results for input(s): CKTOTAL, CKMB, CKMBINDEX, TROPONINI in the last 168 hours. BNP: Invalid input(s): POCBNP CBG: No results for input(s): GLUCAP in the last 168 hours. D-Dimer No results for input(s): DDIMER in the last 72 hours. Hgb A1c No results for input(s): HGBA1C in the last 72 hours. Lipid Profile No results for input(s): CHOL, HDL, LDLCALC, TRIG, CHOLHDL, LDLDIRECT in the last 72 hours. Thyroid function studies No results for input(s): TSH, T4TOTAL, T3FREE, THYROIDAB in the last 72 hours.  Invalid input(s): FREET3 Anemia work up No results for input(s):  VITAMINB12, FOLATE, FERRITIN, TIBC, IRON, RETICCTPCT in the last 72 hours. Urinalysis    Component Value Date/Time   COLORURINE STRAW (A) 05/23/2019 0536   APPEARANCEUR CLEAR 05/23/2019 0536   LABSPEC 1.009 05/23/2019 0536   PHURINE 6.0 05/23/2019 0536   GLUCOSEU NEGATIVE 05/23/2019 0536   HGBUR NEGATIVE 05/23/2019 0536   BILIRUBINUR NEGATIVE 05/23/2019 0536   KETONESUR NEGATIVE 05/23/2019 0536   PROTEINUR NEGATIVE 05/23/2019 0536   UROBILINOGEN 0.2 03/04/2012 1320   NITRITE NEGATIVE 05/23/2019 0536   LEUKOCYTESUR NEGATIVE 05/23/2019 0536     Microbiology Recent Results (from the past 240 hour(s))  SARS CORONAVIRUS 2 (TAT 6-24 HRS) Nasopharyngeal Nasopharyngeal Swab     Status: None   Collection Time: 06/30/19  5:13 PM   Specimen: Nasopharyngeal Swab  Result Value Ref Range Status   SARS Coronavirus 2 NEGATIVE NEGATIVE Final    Comment: (NOTE) SARS-CoV-2 target nucleic acids are NOT DETECTED. The SARS-CoV-2 RNA is generally detectable in upper and lower respiratory specimens during the acute phase of infection. Negative results  do not preclude SARS-CoV-2 infection, do not rule out co-infections with other pathogens, and should not be used as the sole basis for treatment or other patient management decisions. Negative results must be combined with clinical observations, patient history, and epidemiological information. The expected result is Negative. Fact Sheet for Patients: SugarRoll.be Fact Sheet for Healthcare Providers: https://www.woods-mathews.com/ This test is not yet approved or cleared by the [place] FDA and  has been authorized for detection and/or diagnosis of SARS-CoV-2 by FDA under an Emergency Use Authorization (EUA). This EUA will remain  in effect (meaning this test can be used) for the duration of the COVID-19 declaration under Section 56 4(b)(1) of the Act, 21 U.S.C. section 360bbb-3(b)(1), unless the authorization is terminated or revoked sooner. Performed at [facility], [place] [address] [place], [place] [address]        Inpatient Medications:   Scheduled Meds: . brimonidine  1 drop Both Eyes Q12H   And  . timolol  1 drop Both Eyes Q12H  . docusate sodium  100 mg Oral BID  . dorzolamide  1 drop Both Eyes BID  . labetalol  100 mg Oral BID  . latanoprost  1 drop Both Eyes QHS  . potassium chloride  40 mEq Oral Once  . sodium chloride flush  3 mL Intravenous Q12H  . spironolactone  25 mg Oral BID   Continuous Infusions: . sodium chloride       Radiological Exams on Admission: No results found.  Impression/Recommendations Active Problems:   HTN (hypertension)   Anemia   Cauda equina compression (HCC)   Pain and swelling of right knee   Hypokalemia   Spinal stenosis   Esophagitis  Right knee pain and swelling: -Could be secondary to arthritis versus gout? -We will get the x-ray of right knee. -Patient is afebrile, no leukocytosis. -We will check for CRP, sed rate, uric acid level -Oxycodone as needed for pain  control.  Hypertension: Well-controlled -Continue labetalol and Aldactone  Severe multifocal spinal stenosis gait L3-L5: -Likely reason of her bilateral lower extremity weakness. -Reviewed recent MRI of lumbar spine. -Neurosurgery and physical therapy on board  History of grade C esophagitis: -Reviewed endoscopy from 2017. -H&H dropped from 12.9-10.5 in 1 month.  Patient denies melena. -Check iron studies. -Monitor signs for bleeding. -We will start Protonix.  Hypokalemia: -Replenished.  Repeat BMP tomorrow a.m.  Decreased appetite/failure to thrive: -Could be secondary to age related.  Patient denies history of depression. -Albumin: WNL. -We will consult dietitian for calorie and nutrition requirement.  Chronic constipation: Continue MiraLAX  Thank you for this consultation.  Our [facility] hospitalist team will follow the patient with you.   Time Spent: 30 minutes  Mckinley Jewel M.D. [facility] 07/03/2019, 1:07 PM

## 2019-07-03 NOTE — Care Management Important Message (Signed)
Important Message  Patient Details  Name: Brenda Kerr MRN: [id number] Date of Birth: [birth year]   Medicare Important Message Given:  Yes     Orbie Pyo 07/03/2019, 4:02 PM

## 2019-07-03 NOTE — Care Management Important Message (Signed)
Important Message  Patient Details  Name: Brenda Kerr MRN: [id number] Date of Birth: Jun 01, 1941   Medicare Important Message Given:  Yes  Copy left for the memeber she did not sign due to her illness Orbie Pyo 07/03/2019, 4:52 PM

## 2019-07-04 ENCOUNTER — Inpatient Hospital Stay (HOSPITAL_COMMUNITY): Payer: Medicare Other

## 2019-07-04 DIAGNOSIS — M48061 Spinal stenosis, lumbar region without neurogenic claudication: Principal | ICD-10-CM

## 2019-07-04 DIAGNOSIS — M1711 Unilateral primary osteoarthritis, right knee: Secondary | ICD-10-CM

## 2019-07-04 LAB — CBC
HCT: 40.5 % (ref 36.0–46.0)
Hemoglobin: 12.9 g/dL (ref 12.0–15.0)
MCH: 28.7 pg (ref 26.0–34.0)
MCHC: 31.9 g/dL (ref 30.0–36.0)
MCV: 90.2 fL (ref 80.0–100.0)
Platelets: 288 10*3/uL (ref 150–400)
RBC: 4.49 MIL/uL (ref 3.87–5.11)
RDW: 14.1 % (ref 11.5–15.5)
WBC: 9.9 10*3/uL (ref 4.0–10.5)
nRBC: 0 % (ref 0.0–0.2)

## 2019-07-04 LAB — BASIC METABOLIC PANEL
Anion gap: 7 (ref 5–15)
BUN: 24 mg/dL — ABNORMAL HIGH (ref 8–23)
CO2: 24 mmol/L (ref 22–32)
Calcium: 9.5 mg/dL (ref 8.9–10.3)
Chloride: 105 mmol/L (ref 98–111)
Creatinine, Ser: 0.87 mg/dL (ref 0.44–1.00)
GFR calc Af Amer: >60 mL/min (ref >60)
GFR calc non Af Amer: >60 mL/min (ref >60)
Glucose, Bld: 107 mg/dL — ABNORMAL HIGH (ref 70–99)
Potassium: 4.4 mmol/L (ref 3.5–5.1)
Sodium: 136 mmol/L (ref 135–145)

## 2019-07-04 LAB — T4, FREE: Free T4: 0.89 ng/dL (ref 0.61–1.12)

## 2019-07-04 MED ORDER — ENSURE ENLIVE PO LIQD
237.0000 mL | Freq: Two times a day (BID) | ORAL | Status: DC
  Administered 2019-07-06 – 2019-07-10 (×10): 237 mL via ORAL
  Filled 2019-07-04: qty 237

## 2019-07-04 NOTE — TOC Initial Note (Signed)
Transition of Care [facility]) - Initial/Assessment Note    Patient Details  Name: Brenda Kerr MRN: [id number] Date of Birth: May 21, 1941  Transition of Care [facility]) CM/SW Contact:    Brenda Friar, RN Phone Number: 07/04/2019, 4:26 PM  Clinical Narrative:                 Recommendations are for SNF rehab. CM met with the patient and her spouse. They prefer not to do SNF rehab and want to talk with family. Daughter Brenda Kerr called while CM in the room and spoke with her and she feels the patient doesn't have what she needs at home. Brenda Kerr asked that we discuss SNF rehab further tomorrow after MRI results.  TOC will meet again tomorrow.  Expected Discharge Plan: Skilled Nursing Facility Barriers to Discharge: Continued Medical Work up   Patient Goals and CMS Choice     Choice offered to / list presented to : Patient, Spouse, Adult Children  Expected Discharge Plan and Services Expected Discharge Plan: [place] In-house Referral: Clinical Social Work Discharge Planning Services: CM Consult   Living arrangements for the past 2 months: [place]                                      Prior Living Arrangements/Services Living arrangements for the past 2 months: Single Family Home Lives with:: Spouse Patient language and need for interpreter reviewed:: Yes Do you feel safe going back to the place where you live?: Yes      Need for Family Participation in Patient Care: Yes (Comment) Care giver support system in place?: No (comment)(spouse is at the home but daughter states she doesnt have the support she needs)   Criminal Activity/Legal Involvement Pertinent to Current Situation/Hospitalization: No - Comment as needed  Activities of Daily Living Home Assistive Devices/Equipment: Environmental consultant (specify type)(rolling walker) ADL Screening (condition at time of admission) Patient's cognitive ability adequate to safely complete daily activities?: Yes Is the patient  deaf or have difficulty hearing?: No Does the patient have difficulty seeing, even when wearing glasses/contacts?: Yes Does the patient have difficulty concentrating, remembering, or making decisions?: No Patient able to express need for assistance with ADLs?: Yes Does the patient have difficulty dressing or bathing?: Yes Independently performs ADLs?: No Communication: Independent Dressing (OT): Dependent Is this a change from baseline?: Change from baseline, expected to last >3 days Grooming: Dependent Is this a change from baseline?: Change from baseline, expected to last >3 days Feeding: Dependent Is this a change from baseline?: Change from baseline, expected to last >3 days Bathing: Dependent Is this a change from baseline?: Change from baseline, expected to last >3 days Toileting: Dependent Is this a change from baseline?: Change from baseline, expected to last >3days In/Out Bed: Dependent Is this a change from baseline?: Change from baseline, expected to last >3 days Walks in Home: Dependent Is this a change from baseline?: Change from baseline, expected to last >3 days Does the patient have difficulty walking or climbing stairs?: Yes Weakness of Legs: Both Weakness of Arms/Hands: Both  Permission Sought/Granted                  Emotional Assessment Appearance:: Appears stated age Attitude/Demeanor/Rapport: Engaged Affect (typically observed): Accepting Orientation: : Oriented to Self, Oriented to Place, Oriented to  Time, Oriented to Situation   Psych Involvement: No (comment)  Admission diagnosis:  Leg Pain Back Pain Patient Active Problem List   Diagnosis Date Noted  . Pain and swelling of right knee 07/03/2019  . Hypokalemia 07/03/2019  . Spinal stenosis 07/03/2019  . Esophagitis 07/03/2019  . Cauda equina compression ([place]) 06/30/2019  . Venous stasis ulcers of both lower extremities ([place]) 02/02/2016  . Acute blood loss anemia 02/02/2016  . Esophageal  ulcer with bleeding 02/01/2016  . Diastolic CHF ([place]) 22/58/3462  . Anemia 01/31/2016  . Falls 01/31/2016  . Essential hypertension 01/31/2016  . Venous stasis 01/30/2016  . Physical deconditioning 01/30/2016  . Cellulitis of right leg 01/28/2016  . HTN (hypertension) 01/28/2016  . Abnormality of gait 04/19/2012  . Hip pain 04/19/2012  . Hip stiffness 04/19/2012   PCP:  Lucianne Lei, MD Pharmacy:   [facility] [place], [place] - [address] [place] [address] Phone: [phone] Fax: [phone]     Social Determinants of Health (SDOH) Interventions    Readmission Risk Interventions No flowsheet data found.

## 2019-07-04 NOTE — Progress Notes (Signed)
Neurosurgery Service Progress Note  Subjective: No acute events overnight, no new complaints  Objective: Vitals:   07/03/19 1800 07/03/19 2003 07/03/19 2344 07/04/19 0418  BP: (!) 126/54 136/63 (!) 130/58 (!) 138/50  Pulse: (!) 57 (!) 57 (!) 54 (!) 47  Resp: 16 18 18 18   Temp: 98 F (36.7 C) 98.1 F (36.7 C) 97.7 F (36.5 C) 97.6 F (36.4 C)  TempSrc: Oral Oral Oral Oral  SpO2: 98% 100% 100% 100%  Weight:      Height:       Temp (24hrs), Avg:97.9 F (36.6 C), Min:97.6 F (36.4 C), Max:98.1 F (36.7 C)  CBC Latest Ref Rng & Units 07/04/2019 06/30/2019 05/23/2019  WBC 4.0 - 10.5 K/uL 9.9 7.2 7.9  Hemoglobin 12.0 - 15.0 g/dL 12.9 10.5(L) 12.9  Hematocrit 36.0 - 46.0 % 40.5 34.1(L) 40.9  Platelets 150 - 400 K/uL 288 272 256   BMP Latest Ref Rng & Units 07/04/2019 06/30/2019 05/23/2019  Glucose 70 - 99 mg/dL 107(H) 81 99  BUN 8 - 23 mg/dL 24(H) 19 20  Creatinine 0.44 - 1.00 mg/dL 0.87 0.72 0.94  Sodium 135 - 145 mmol/L 136 138 140  Potassium 3.5 - 5.1 mmol/L 4.4 3.3(L) 4.1  Chloride 98 - 111 mmol/L 105 112(H) 107  CO2 22 - 32 mmol/L 24 20(L) 23  Calcium 8.9 - 10.3 mg/dL 9.5 7.3(L) 9.9    Intake/Output Summary (Last 24 hours) at 07/04/2019 0710 Last data filed at 07/03/2019 2007 Gross per 24 hour  Intake 200 ml  Output 700 ml  Net -500 ml    Current Facility-Administered Medications:  .  0.9 %  sodium chloride infusion, 250 mL, Intravenous, Continuous, Adar Rase, Joyice Faster, MD .  acetaminophen (TYLENOL) tablet 650 mg, 650 mg, Oral, Q4H PRN, 650 mg at 07/03/19 1208 **OR** acetaminophen (TYLENOL) suppository 650 mg, 650 mg, Rectal, Q4H PRN, Judith Part, MD .  brimonidine (ALPHAGAN) 0.2 % ophthalmic solution 1 drop, 1 drop, Both Eyes, Q12H, 1 drop at 07/03/19 2202 **AND** timolol (TIMOPTIC) 0.5 % ophthalmic solution 1 drop, 1 drop, Both Eyes, Q12H, [place]ning [place], Joyice Faster, MD, 1 drop at 07/03/19 2155 .  cyclobenzaprine (FLEXERIL) tablet 10 mg, 10 mg, Oral, TID PRN,  Judith Part, MD, 10 mg at 07/02/19 2100 .  diclofenac sodium (VOLTAREN) 1 % transdermal gel 2 g, 2 g, Topical, QID PRN, Judith Part, MD .  docusate sodium (COLACE) capsule 100 mg, 100 mg, Oral, BID, Judith Part, MD, 100 mg at 07/03/19 2156 .  dorzolamide (TRUSOPT) 2 % ophthalmic solution 1 drop, 1 drop, Both Eyes, BID, Anyelo Mccue, Joyice Faster, MD, 1 drop at 07/03/19 2158 .  HYDROmorphone (DILAUDID) injection 0.5 mg, 0.5 mg, Intravenous, Q3H PRN, Judith Part, MD, 0.5 mg at 06/30/19 2251 .  labetalol (NORMODYNE) tablet 100 mg, 100 mg, Oral, BID, Judith Part, MD, 100 mg at 07/03/19 2156 .  latanoprost (XALATAN) 0.005 % ophthalmic solution 1 drop, 1 drop, Both Eyes, QHS, Nkechi Linehan A, MD, 1 drop at 07/03/19 2150 .  menthol-cetylpyridinium (CEPACOL) lozenge 3 mg, 1 lozenge, Oral, PRN **OR** phenol (CHLORASEPTIC) mouth spray 1 spray, 1 spray, Mouth/Throat, PRN, Judith Part, MD .  ondansetron (ZOFRAN) tablet 4 mg, 4 mg, Oral, Q6H PRN, 4 mg at 06/30/19 1928 **OR** ondansetron (ZOFRAN) injection 4 mg, 4 mg, Intravenous, Q6H PRN, Judith Part, MD, 4 mg at 07/01/19 2112 .  oxyCODONE (Oxy IR/ROXICODONE) immediate release tablet 10 mg, 10 mg, Oral, Q4H PRN, Jethro Radke,  Joyice Faster, MD, 10 mg at 07/03/19 2350 .  oxyCODONE (Oxy IR/ROXICODONE) immediate release tablet 5 mg, 5 mg, Oral, Q4H PRN, Judith Part, MD, 5 mg at 07/02/19 2101 .  pantoprazole (PROTONIX) EC tablet 40 mg, 40 mg, Oral, Daily, Pahwani, Rinka R, MD, 40 mg at 07/03/19 1403 .  polyethylene glycol (MIRALAX / GLYCOLAX) packet 17 g, 17 g, Oral, Daily PRN, Judith Part, MD, 17 g at 07/03/19 1536 .  sodium chloride flush (NS) 0.9 % injection 3 mL, 3 mL, Intravenous, Q12H, Gavino Fouch, Joyice Faster, MD, 3 mL at 07/03/19 2156 .  sodium chloride flush (NS) 0.9 % injection 3 mL, 3 mL, Intravenous, PRN, Judith Part, MD .  spironolactone (ALDACTONE) tablet 25 mg, 25 mg, Oral, BID, Sabian Kuba,  Joyice Faster, MD, 25 mg at 07/03/19 1759   Physical Exam: AOx1, PERRL, EOMI, FS, Strength 5/5 pain-limited x4,   Assessment & Plan: 78 y.o. woman transferred for concern for cauda equina syndrome, no complaints c/w or exam evidence of cauda equina. Main complaint is foot numbness and b/l hip pain.  -will have to get EMG/NCV as outpatient for foot/hand numbness, no acute neurosurgical intervention indicated -medicine recs -ortho rec'd b/l MARS MRI hip, ordered & pending -PT/OT rec SNF, likely will need SNF placement, will c/s CSW  Judith Part  07/04/19 7:10 AM

## 2019-07-04 NOTE — Progress Notes (Signed)
Initial Nutrition Assessment  DOCUMENTATION CODES:   Not applicable  INTERVENTION:  -Ensure Enlive po BID, each supplement provides 350 kcal and 20 grams of protein   NUTRITION DIAGNOSIS:   Increased nutrient needs related to acute illness as evidenced by estimated needs.  GOAL:   Patient will meet greater than or equal to 90% of their needs    MONITOR:   PO intake, Supplement acceptance, I & O's, Labs, Weight trends  REASON FOR ASSESSMENT:   Consult Assessment of nutrition requirement/status  ASSESSMENT:  RD working remotely.   78 year old female with past medical history significant of T2DM, GERD, HTN, PVD, arthritis, dysrhythmia severe spinal stenosis, NSAID induced esophagitis who presented with bilateral hip and back pain and urinary incontinence.  Patient admitted for concerns of cauda equina syndrome. She was evaluated by neurosurgery-no acute neurosurgical intervention indicated. Per chart review, no findings of cauda equina syndrome, exam consistent with a severe bilateral hip arthropathy. Internal medicine noted right knee pain with swelling possibly secondary to gout; x-ray pending.   Daughter of patient reported patient with decreased appetite and wt loss per chart review. Currently she is eating 40-100% of meals on regular diet. No significant weight history for review. Noted 2.4 lb (1.6%) wt loss over the past 6 weeks which is insignificant for time frame. Will continue to monitor for po intake and provide Ensure to aid with calorie/protein needs.  05/18/19 68.9 kg  11/25/17 68.9 kg   I/Os: -2280 ml since admit      -500 ml x 24 hrs UOP: 700 ml x 24 hr Medications reviewed and include: Colace, Protonix Labs: reviewed  NUTRITION - FOCUSED PHYSICAL EXAM: Unable to complete at this time; RD working remotely.   Diet Order:   Diet Order            Diet regular Room service appropriate? Yes; Fluid consistency: Thin  Diet effective now               EDUCATION NEEDS:   No education needs have been identified at this time  Skin:  Skin Assessment: Reviewed RN Assessment  Last BM:  PTA  Height:   Ht Readings from Last 1 Encounters:  06/30/19 5\' 2"  (1.575 m)    Weight:   Wt Readings from Last 1 Encounters:  06/30/19 67.8 kg    Ideal Body Weight:  50 kg  BMI:  Body mass index is 27.34 kg/m.  Estimated Nutritional Needs:   Kcal:  1600-1800  Protein:  80-90  Fluid:  >/= 1.6 L/day   Lajuan Lines, RD, LDN Clinical Nutrition Office [phone] After Hours/Weekend Pager: [phone]

## 2019-07-04 NOTE — Progress Notes (Addendum)
PROGRESS NOTE                                                                                                                                                                                                             Patient Demographics:    Brenda Kerr, is a 78 y.o. female, DOB - 13-Aug-1941, MHW:[id number]  Admit date - 06/30/2019   Admitting Physician Judith Part, MD  Outpatient Primary MD for the patient is Lucianne Lei, MD  LOS - 4    Chief Complaint  Patient presents with  . Back Pain  . Leg Pain       Brief Narrative  78 y.o. female hypertension, arthritis, chronic constipation, severe spinal stenosis, NSAID induced esophagitis admitted for the concern of cauda equina syndrome.  Patient was evaluated by neurosurgery-no surgical intervention required.  Patient continues to have back pain, bilateral hip pain, leg pain, feet pain since couple of weeks to 1 month.  Patient reports right knee swelling since 2 to 3 weeks.  medical consult called for Pain in multiple joints, failure to thrive, right knee swelling and pain.      Subjective:   Reports LE weakness with some improvement and participating with PT. Rt knee pain better today   Assessment  & Plan :   Right knee pain and swelling  appears to be due to osteoarthritis with findings on xray. 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 Uric acid normal. ESR mildly elevated, CRP normal. continue prn oxycodone and PT  Multifocal spinal stenosis Associated b/l LE weakness. Plan per primary. PT recommends SNF  Grade C esophagitis  no acute symptoms. H&h stable. Iron studies normal. On protonix  hypokalemia  replaced  Essential hypertension  stable on labetalol and aldactone  FTT in adult Poor appetite. Nutrition consult  Low TSH/ ? Subclinical hyperthyroid Check free t4. No symptoms of hyperthyroidism  Code Status : full   Family Communication   :none  Disposition Plan  : SNF       Procedures  : MRI spine  DVT Prophylaxis  :  Lovenox -   Lab Results  Component Value Date   PLT 288 07/04/2019    Antibiotics  :    Anti-infectives (From admission, onward)   Start     Dose/Rate Route Frequency Ordered Stop   06/30/19 1030  Ampicillin-Sulbactam (UNASYN) 3 g in sodium chloride 0.9 % 100 mL IVPB     3 g 200 mL/hr over 30 Minutes Intravenous  Once 06/30/19 1018 06/30/19 1141        Objective:   Vitals:   07/03/19 2003 07/03/19 2344 07/04/19 0418 07/04/19 0908  BP: 136/63 (!) 130/58 (!) 138/50 (!) 137/57  Pulse: (!) 57 (!) 54 (!) 47 61  Resp: '18 18 18 16  ' Temp: 98.1 F (36.7 C) 97.7 F (36.5 C) 97.6 F (36.4 C) 98.3 F (36.8 C)  TempSrc: Oral Oral Oral Oral  SpO2: 100% 100% 100% 100%  Weight:      Height:        Wt Readings from Last 3 Encounters:  06/30/19 67.8 kg  05/18/19 68.9 kg  11/25/17 68.9 kg     Intake/Output Summary (Last 24 hours) at 07/04/2019 1144 Last data filed at 07/04/2019 0954 Gross per 24 hour  Intake -  Output 1350 ml  Net -1350 ml     Physical Exam  Gen: not in distress HEENT: moist mucosa, supple neck Chest: clear b/l, no added sounds CVS: N S1&S2, no murmurs,  GI: soft, NT, ND, BS+ Musculoskeletal: warm, no edema, mild rt leg swelling with no tenderness or effusion CNS: Alert and wake, 4/5 power in LE, normal sensation    Data Review:    CBC Recent Labs  Lab 06/30/19 1131 07/04/19 0324  WBC 7.2 9.9  HGB 10.5* 12.9  HCT 34.1* 40.5  PLT 272 288  MCV 92.2 90.2  MCH 28.4 28.7  MCHC 30.8 31.9  RDW 14.2 14.1    Chemistries  Recent Labs  Lab 06/30/19 1131 07/04/19 0324  NA 138 136  K 3.3* 4.4  CL 112* 105  CO2 20* 24  GLUCOSE 81 107*  BUN 19 24*  CREATININE 0.72 0.87  CALCIUM 7.3* 9.5   ------------------------------------------------------------------------------------------------------------------ No results for input(s): CHOL, HDL, LDLCALC, TRIG,  CHOLHDL, LDLDIRECT in the last 72 hours.  No results found for: HGBA1C ------------------------------------------------------------------------------------------------------------------ Recent Labs    07/03/19 1409  TSH 0.276*   ------------------------------------------------------------------------------------------------------------------ Recent Labs    07/03/19 1409  FERRITIN 48  TIBC 349  IRON 45    Coagulation profile No results for input(s): INR, PROTIME in the last 168 hours.  No results for input(s): DDIMER in the last 72 hours.  Cardiac Enzymes No results for input(s): CKMB, TROPONINI, MYOGLOBIN in the last 168 hours.  Invalid input(s): CK ------------------------------------------------------------------------------------------------------------------    Component Value Date/Time   BNP 25.6 01/28/2016 0453    Inpatient Medications  Scheduled Meds: . brimonidine  1 drop Both Eyes Q12H   And  . timolol  1 drop Both Eyes Q12H  . docusate sodium  100 mg Oral BID  . dorzolamide  1 drop Both Eyes BID  . labetalol  100 mg Oral BID  . latanoprost  1 drop Both Eyes QHS  . pantoprazole  40 mg Oral Daily  . sodium chloride flush  3 mL Intravenous Q12H  . spironolactone  25 mg Oral BID   Continuous Infusions: . sodium chloride     PRN Meds:.acetaminophen **OR** acetaminophen, cyclobenzaprine, diclofenac sodium, HYDROmorphone (DILAUDID) injection, menthol-cetylpyridinium **OR** phenol, ondansetron **OR** ondansetron (ZOFRAN) IV, oxyCODONE, oxyCODONE, polyethylene glycol, sodium chloride flush  Micro Results Recent Results (from the past 240 hour(s))  SARS CORONAVIRUS 2 (TAT 6-24 HRS) Nasopharyngeal Nasopharyngeal Swab     Status: None   Collection Time: 06/30/19  5:13 PM   Specimen: Nasopharyngeal Swab  Result Value Ref Range Status   SARS Coronavirus 2 NEGATIVE NEGATIVE Final    Comment: (NOTE) SARS-CoV-2 target nucleic acids are NOT DETECTED. The  SARS-CoV-2 RNA is generally detectable in upper and lower respiratory specimens during the acute phase of infection. Negative results do not preclude SARS-CoV-2 infection, do not rule out co-infections with other pathogens, and should not be used as the sole basis for treatment or other patient management decisions. Negative results must be combined with clinical observations, patient history, and epidemiological information. The expected result is Negative. Fact Sheet for Patients: SugarRoll.be Fact Sheet for Healthcare Providers: https://www.woods-mathews.com/ This test is not yet approved or cleared by the [place] FDA and  has been authorized for detection and/or diagnosis of SARS-CoV-2 by FDA under an Emergency Use Authorization (EUA). This EUA will remain  in effect (meaning this test can be used) for the duration of the COVID-19 declaration under Section 56 4(b)(1) of the Act, 21 U.S.C. section 360bbb-3(b)(1), unless the authorization is terminated or revoked sooner. Performed at [facility], [place] [address], [place], [place] [address]     Radiology Reports Dg Knee 1-2 Views Right  Result Date: 07/03/2019 CLINICAL DATA:  Right knee swelling 2-3 weeks.  No injury. EXAM: RIGHT KNEE - 1-2 VIEW COMPARISON:  None. FINDINGS: Moderate tricompartmental osteoarthritic change. No acute fracture or dislocation. No joint effusion. IMPRESSION: No acute findings. Moderate osteoarthritis. Electronically Signed   By: Marin Olp M.D.   On: 07/03/2019 13:48   Mr Lumbar Spine Wo Contrast  Result Date: 06/30/2019 CLINICAL DATA:  Acute presentation with bilateral leg pain and low back pain worsening recently. EXAM: MRI LUMBAR SPINE WITHOUT CONTRAST TECHNIQUE: Multiplanar, multisequence MR imaging of the lumbar spine was performed. No intravenous contrast was administered. COMPARISON:  Radiography 09/25/2015 FINDINGS: Segmentation:  5 lumbar  type vertebral bodies assumed. Alignment: 2 mm retrolisthesis L2-3. 2 mm anterolisthesis L3-4. 3 mm anterolisthesis L4-5. Vertebrae: No recent fracture or primary bone lesion. Chronic discogenic endplate changes and superior endplate Schmorl's node at L2. Conus medullaris and cauda equina: Conus extends to the L1 level. Conus and cauda equina appear normal. Paraspinal and other soft tissues: Right renal cyst. Disc levels: T11-12 and T12-L1 were not studied in the axial plane. There shallow disc protrusions in there is facet hypertrophy at those levels. There is canal narrowing with effacement of the subarachnoid space but no visible cord compression. L1-2: Shallow disc protrusion. Mild facet and ligamentous hypertrophy. Mild stenosis of both lateral recesses but no visible neural compression. L2-3: 2 mm retrolisthesis. Disc degeneration with a shallow disc herniation in the left posterolateral direction. Facet and ligamentous hypertrophy. Stenosis of the left lateral recess and intervertebral foramen on the left that could cause neural compression. Mild right-sided lateral recess and foraminal narrowing. L3-4: 2 mm anterolisthesis. Circumferential protrusion of the disc. Facet and ligamentous hypertrophy. Severe multifactorial spinal stenosis that could cause neural compression on either or both sides. L4-5: 3 mm of anterolisthesis. Severe bilateral facet arthropathy with facet and ligamentous hypertrophy. Broad-based herniation the disc. Severe spinal stenosis likely to cause neural compression on both sides. Bilateral foraminal stenosis. L5-S1: Bulging of the disc. Facet and ligamentous hypertrophy worse on the right. No compressive canal stenosis. Bilateral foraminal narrowing right more than left with some potential to affect in particular the right L5 nerve. IMPRESSION: 1. Severe multifactorial spinal stenosis worse at L4-5 than L3-4 that could cause neural compression on either or both sides. 2. Widespread  lesser but potentially significant  degenerative changes at other levels as outlined above. Electronically Signed   By: Nelson Chimes M.D.   On: 06/30/2019 08:35   Dg Chest Portable 1 View  Result Date: 06/30/2019 CLINICAL DATA:  Preoperative examination (back surgery). History of diabetes and hypertension. EXAM: PORTABLE CHEST 1 VIEW COMPARISON:  01/27/2016 FINDINGS: Grossly unchanged cardiac silhouette and mediastinal contours with atherosclerotic plaque within the thoracic aorta. No focal airspace opacities. No pleural effusion or pneumothorax. No evidence of edema. There is a minimal amount of pleuroparenchymal thickening about the right minor fissure. No definite acute osseous abnormalities. IMPRESSION: No acute cardiopulmonary disease on this AP portable examination. Further evaluation with a PA and lateral chest radiograph may be obtained as clinically indicated. Electronically Signed   By: Sandi Mariscal M.D.   On: 06/30/2019 11:55    Time Spent in minutes  25   Shad Ledvina M.D on 07/04/2019 at 11:44 AM  Between 7am to 7pm - Pager - [phone]  After 7pm go to [email or web address] - password [facility]  [facility] -  Office  [phone]

## 2019-07-04 NOTE — Progress Notes (Signed)
Occupational Therapy Evaluation Patient Details Name: Brenda Kerr MRN: [id number] DOB: 06/12/1941 Today's Date: 07/04/2019    History of Present Illness  78 y.o. woman with DM, PVD, B THA, glaucoma, GERD, and HTN who presents with severe bilateral hip and back pain. MRI C/T/L  shows multiple areas of stenosis and severe degenerative disease. In the lumbar spine, it is worst at L3-4 and L4-5. In the thoracic spine, there is some moderate stenosis at T10-11 and T11-12. In the cervical spine, there is moderate to moderately severe stenosis at C3-4. Original concern for cauda equina but that has been ruled out.    Clinical Impression   PTA, pt was living at home with her husband, and reports prior to onset of weakness she was independent with ADL/IADL and functional mobility. Pt reports she has been requiring assistance from her husband and family for previous 2-3 weeks. Pt currently requires maxA for bed mobility and maxA with eating, grooming and UB ADL due to limited functional use of BUE. Pt demonstrates decreased strength, sensation, and fine motor skills impacting her independence with ADL. Due to decline in current level of function, pt would benefit from acute OT to address established goals to facilitate safe D/C to venue listed below. At this time, recommend SNF follow-up. Will continue to follow acutely.     Follow Up Recommendations  SNF    Equipment Recommendations  3 in 1 bedside commode    Recommendations for Other Services       Precautions / Restrictions Precautions Precautions: Fall Precaution Comments: L eye blind; glaucoma in right eye Restrictions Weight Bearing Restrictions: No      Mobility Bed Mobility Overal bed mobility: Needs Assistance Bed Mobility: Supine to Sit;Sit to Supine     Supine to sit: Max assist Sit to supine: Max assist   General bed mobility comments: pt able to partially grasp L rail with R hand to pull up but max A needed for LE's off  bed and elevation of trunk to sitting. Completelt dependent for return to supine, unable to lift LE's.   Transfers Overall transfer level: Needs assistance   Transfers: Lateral/Scoot Transfers          Lateral/Scoot Transfers: Total assist General transfer comment: attempted to scoot L EOB but pt was tot A, unable to push through floor with feet to scoot or effectively use UE's    Balance Overall balance assessment: Needs assistance Sitting-balance support: Feet supported Sitting balance-Leahy Scale: Poor Sitting balance - Comments: needed mod A to maintain sitting with a couple 10 sec bouts of min-guard but pt fatigued very quickly and leaned posteriorly Postural control: Posterior lean     Standing balance comment: unable                           ADL either performed or assessed with clinical judgement   ADL Overall ADL's : Needs assistance/impaired Eating/Feeding: Maximal assistance   Grooming: Maximal assistance   Upper Body Bathing: Maximal assistance   Lower Body Bathing: Total assistance   Upper Body Dressing : Total assistance   Lower Body Dressing: Total assistance   Toilet Transfer: Total assistance   Toileting- Clothing Manipulation and Hygiene: Total assistance         General ADL Comments: deferred functional mobiltiy for therapist and pt safety;pt required totalA in all aspects of ADL;pt would benefit from adaptive equipment to increase independence with ADL, began to discuss adaptive equipment  Vision Baseline Vision/History: Glaucoma;Legally blind Patient Visual Report: No change from baseline Additional Comments: legally blind in left eye, glaucoma in right eye;significantly impacts pt independence and safety with ADL and functioanl mobiltiy     Perception     Praxis      Pertinent Vitals/Pain Pain Assessment: 0-10 Pain Score: 4  Pain Location: R knee Pain Descriptors / Indicators: Aching Pain Intervention(s): Monitored  during session;Repositioned     Hand Dominance Right   Extremity/Trunk Assessment Upper Extremity Assessment Upper Extremity Assessment: Generalized weakness;RUE deficits/detail;LUE deficits/detail RUE Deficits / Details: grossly 3-/5;limited shoulder flexion ROM AROM about 80degrees, PROM shoulder flexion about 110degrees;despite good grip strength, poor pinch strength and unable to open toothpaste;decreased ability to extend 3rd digit at DIP joint RUE Sensation: decreased proprioception;decreased light touch LUE Deficits / Details: grossly 3-/5;limited shoulder flexion ROM AROM about 80degrees, PROM shoulder flexion about 110degrees;despite good grip strength, poor pinch strength and unable to maintain grasp on container;unable to detect light sensation but able to feel deep pressure;decrease proprioception;noted a scabbed over wound on 3rd digit pt reports she might have burned herself   Lower Extremity Assessment Lower Extremity Assessment: Defer to PT evaluation   Cervical / Trunk Assessment Cervical / Trunk Assessment: Kyphotic   Communication Communication Communication: No difficulties   Cognition Arousal/Alertness: Awake/alert Behavior During Therapy: WFL for tasks assessed/performed Overall Cognitive Status: Within Functional Limits for tasks assessed                                 General Comments: WFL for basic history   General Comments       Exercises     Shoulder Instructions      Home Living Family/patient expects to be discharged to:: Private residence Living Arrangements: Spouse/significant other Available Help at Discharge: Family;Available 24 hours/day Type of Home: House Home Access: Stairs to enter [employer] of Steps: 4 Entrance Stairs-Rails: Right Home Layout: One level     Bathroom Shower/Tub: Teacher, early years/pre: Standard     Home Equipment: Environmental consultant - 2 wheels;Cane - single point;Shower seat           Prior Functioning/Environment Level of Independence: Needs assistance  Gait / Transfers Assistance Needed: pt reports she has not ambulated in 2-3 months but has only sat EOB past 2-3 weeks ADL's / Homemaking Assistance Needed: needs assist for all ADL's for previous 2-3 weeks, prior to that she reports she was independent with ADL and enjoys cooking             OT Problem List: Decreased strength;Decreased range of motion;Decreased activity tolerance;Impaired balance (sitting and/or standing);Decreased safety awareness;Decreased coordination;Impaired vision/perception;Decreased knowledge of precautions;Impaired UE functional use;Pain      OT Treatment/Interventions: Self-care/ADL training;Therapeutic exercise;Therapeutic activities;Cognitive remediation/compensation;Visual/perceptual remediation/compensation;Patient/family education;Balance training;DME and/or AE instruction    OT Goals(Current goals can be found in the care plan section) Acute Rehab OT Goals Patient Stated Goal: to be able to use hands again OT Goal Formulation: With patient Time For Goal Achievement: 07/18/19 Potential to Achieve Goals: Good ADL Goals Pt Will Perform Eating: with adaptive utensils;with set-up Pt Will Perform Grooming: with set-up;with adaptive equipment Pt Will Transfer to Toilet: with mod assist;stand pivot transfer Pt Will Perform Toileting - Clothing Manipulation and hygiene: with mod assist;sit to/from stand  OT Frequency: Min 2X/week   Barriers to D/C: Decreased caregiver support  pt's husband unable to adequately assist pt at level she  would need       Co-evaluation              AM-PAC OT "6 Clicks" Daily Activity     Outcome Measure Help from another person eating meals?: Total Help from another person taking care of personal grooming?: Total Help from another person toileting, which includes using toliet, bedpan, or urinal?: Total Help from another person bathing  (including washing, rinsing, drying)?: Total Help from another person to put on and taking off regular upper body clothing?: Total Help from another person to put on and taking off regular lower body clothing?: Total 6 Click Score: 6   End of Session Nurse Communication: Mobility status  Activity Tolerance: Patient tolerated treatment well Patient left: in bed;with call bell/phone within reach;with bed alarm set  OT Visit Diagnosis: Unsteadiness on feet (R26.81);Other abnormalities of gait and mobility (R26.89);Muscle weakness (generalized) (M62.81);Low vision, both eyes (H54.2);Other symptoms and signs involving cognitive function;Feeding difficulties (R63.3);Pain Pain - part of body: Leg                Time: [id number] OT Time Calculation (min): 18 min Charges:  OT General Charges $OT Visit: 1 Visit OT Evaluation $OT Eval Moderate Complexity: Lorenzo OTR/L Acute Rehabilitation Services Office: [place] 07/04/2019, 12:20 PM

## 2019-07-05 DIAGNOSIS — D519 Vitamin B12 deficiency anemia, unspecified: Secondary | ICD-10-CM

## 2019-07-05 DIAGNOSIS — M5137 Other intervertebral disc degeneration, lumbosacral region: Secondary | ICD-10-CM

## 2019-07-05 DIAGNOSIS — G629 Polyneuropathy, unspecified: Secondary | ICD-10-CM

## 2019-07-05 LAB — RPR: RPR Ser Ql: NONREACTIVE

## 2019-07-05 LAB — VITAMIN B12: Vitamin B-12: 326 pg/mL (ref 180–914)

## 2019-07-05 MED ORDER — HEPARIN SODIUM (PORCINE) 5000 UNIT/ML IJ SOLN
5000.0000 [IU] | Freq: Three times a day (TID) | INTRAMUSCULAR | Status: DC
  Administered 2019-07-05 – 2019-07-10 (×17): 5000 [IU] via SUBCUTANEOUS
  Filled 2019-07-05 (×17): qty 1

## 2019-07-05 MED ORDER — IBUPROFEN 200 MG PO TABS: 400.0000 mg | ORAL_TABLET | Freq: Four times a day (QID) | ORAL | Status: AC | PRN

## 2019-07-05 MED ORDER — DEXAMETHASONE SODIUM PHOSPHATE 4 MG/ML IJ SOLN
4.0000 mg | Freq: Once | INTRAMUSCULAR | Status: AC
  Administered 2019-07-05: 4 mg via INTRAVENOUS
  Filled 2019-07-05: qty 1

## 2019-07-05 MED ORDER — VITAMIN B-12 1000 MCG PO TABS
1000.0000 ug | ORAL_TABLET | Freq: Every day | ORAL | Status: DC
  Administered 2019-07-05 – 2019-07-10 (×6): 1000 ug via ORAL
  Filled 2019-07-05 (×6): qty 1

## 2019-07-05 NOTE — Progress Notes (Addendum)
Neurosurgery Service Progress Note  Subjective: No acute events overnight, no new complaints  Objective: Vitals:   07/04/19 1559 07/04/19 1950 07/05/19 0006 07/05/19 0331  BP: (!) 134/56 129/68 122/67 129/67  Pulse: (!) 54 61 66 69  Resp: _0 Temp: 98.6 F (37 C) 98.4 F (36.9 C) 98.5 F (36.9 C) 98.3 F (36.8 C)  TempSrc: Oral Oral Oral Oral  SpO2: 100% 100% 100% 100%  Weight:      Height:       Temp (24hrs), Avg:98.5 F (36.9 C), Min:98.3 F (36.8 C), Max:98.9 F (37.2 C)  CBC Latest Ref Rng & Units 07/04/2019 06/30/2019 05/23/2019  WBC 4.0 - 10.5 K/uL 9.9 7.2 7.9  Hemoglobin 12.0 - 15.0 g/dL 12.9 10.5(L) 12.9  Hematocrit 36.0 - 46.0 % 40.5 34.1(L) 40.9  Platelets 150 - 400 K/uL 288 272 256   BMP Latest Ref Rng & Units 07/04/2019 06/30/2019 05/23/2019  Glucose 70 - 99 mg/dL 107(H) 81 99  BUN 8 - 23 mg/dL 24(H) 19 20  Creatinine 0.44 - 1.00 mg/dL 0.87 0.72 0.94  Sodium 135 - 145 mmol/L 136 138 140  Potassium 3.5 - 5.1 mmol/L 4.4 3.3(L) 4.1  Chloride 98 - 111 mmol/L 105 112(H) 107  CO2 22 - 32 mmol/L 24 20(L) 23  Calcium 8.9 - 10.3 mg/dL 9.5 7.3(L) 9.9    Intake/Output Summary (Last 24 hours) at 07/05/2019 0926 Last data filed at 07/05/2019 0400 Gross per 24 hour  Intake 240 ml  Output 1950 ml  Net -1710 ml    Current Facility-Administered Medications:  .  0.9 %  sodium chloride infusion, 250 mL, Intravenous, Continuous, Jaelen Gellerman, Joyice Faster, MD .  acetaminophen (TYLENOL) tablet 650 mg, 650 mg, Oral, Q4H PRN, 650 mg at 07/05/19 0402 **OR** acetaminophen (TYLENOL) suppository 650 mg, 650 mg, Rectal, Q4H PRN, Judith Part, MD .  brimonidine (ALPHAGAN) 0.2 % ophthalmic solution 1 drop, 1 drop, Both Eyes, Q12H, 1 drop at 07/04/19 2223 **AND** timolol (TIMOPTIC) 0.5 % ophthalmic solution 1 drop, 1 drop, Both Eyes, Q12H, Tashari Schoenfelder, Joyice Faster, MD, 1 drop at 07/04/19 2223 .  cyclobenzaprine (FLEXERIL) tablet 10 mg, 10 mg, Oral, TID PRN, Judith Part, MD,  10 mg at 07/02/19 2100 .  diclofenac sodium (VOLTAREN) 1 % transdermal gel 2 g, 2 g, Topical, QID PRN, Judith Part, MD .  docusate sodium (COLACE) capsule 100 mg, 100 mg, Oral, BID, Judith Part, MD, 100 mg at 07/04/19 2224 .  dorzolamide (TRUSOPT) 2 % ophthalmic solution 1 drop, 1 drop, Both Eyes, BID, Chisa Kushner, Joyice Faster, MD, 1 drop at 07/04/19 2223 .  feeding supplement (ENSURE ENLIVE) (ENSURE ENLIVE) liquid 237 mL, 237 mL, Oral, BID BM, Kert Shackett A, MD .  HYDROmorphone (DILAUDID) injection 0.5 mg, 0.5 mg, Intravenous, Q3H PRN, Judith Part, MD, 0.5 mg at 06/30/19 2251 .  labetalol (NORMODYNE) tablet 100 mg, 100 mg, Oral, BID, Judith Part, MD, 100 mg at 07/04/19 2224 .  latanoprost (XALATAN) 0.005 % ophthalmic solution 1 drop, 1 drop, Both Eyes, QHS, Glendia Olshefski A, MD, 1 drop at 07/04/19 2224 .  menthol-cetylpyridinium (CEPACOL) lozenge 3 mg, 1 lozenge, Oral, PRN **OR** phenol (CHLORASEPTIC) mouth spray 1 spray, 1 spray, Mouth/Throat, PRN, Judith Part, MD .  ondansetron (ZOFRAN) tablet 4 mg, 4 mg, Oral, Q6H PRN, 4 mg at 06/30/19 1928 **OR** ondansetron (ZOFRAN) injection 4 mg, 4 mg, Intravenous, Q6H PRN, Judith Part, MD, 4 mg at 07/04/19 1713 .  oxyCODONE (Oxy IR/ROXICODONE) immediate release tablet 10 mg, 10 mg, Oral, Q4H PRN, Judith Part, MD, 10 mg at 07/03/19 2350 .  oxyCODONE (Oxy IR/ROXICODONE) immediate release tablet 5 mg, 5 mg, Oral, Q4H PRN, Judith Part, MD, 5 mg at 07/02/19 2101 .  pantoprazole (PROTONIX) EC tablet 40 mg, 40 mg, Oral, Daily, Pahwani, Rinka R, MD, 40 mg at 07/04/19 1024 .  polyethylene glycol (MIRALAX / GLYCOLAX) packet 17 g, 17 g, Oral, Daily PRN, Judith Part, MD, 17 g at 07/03/19 1536 .  sodium chloride flush (NS) 0.9 % injection 3 mL, 3 mL, Intravenous, Q12H, Lio Wehrly, Joyice Faster, MD, 10 mL at 07/04/19 2224 .  sodium chloride flush (NS) 0.9 % injection 3 mL, 3 mL, Intravenous, PRN,  Judith Part, MD .  spironolactone (ALDACTONE) tablet 25 mg, 25 mg, Oral, BID, Surya Folden, Joyice Faster, MD, 25 mg at 07/04/19 1716   Physical Exam: AOx1, PERRL, EOMI, FS, Strength 5/5 pain-limited x4   Assessment & Plan: 78 y.o. woman transferred for concern for cauda equina syndrome, no complaints c/w or exam evidence of cauda equina. Main complaint is foot numbness and b/l hip pain.  Neuro / Hand/foot numbness: -has multiple areas of spinal stenosis - cervical / thoracic / lumbar, but atypical exam for any spinal pathology, will need EMG/NCV as outpatient to determine if peripheral or spinal  Medicine rec: likely osteo, normal ESR/CRP/urate, repleted hypokalemia, no other active issues, likely signing off today  Ortho: rec'd b/l MARS MRI hips, which showed R sided particle dz, will need outpatient follow up -will try small dose of steroids to help with inflammatory component of pain, NSAID, already on PPI. Looks like admission for upper GI bleed was in the setting of therapeutic lovenox while on long term use of NSAIDs. Will give short course of NSAIDs here to help with ambulation  Disposition: PT/OT rec SNF, will need SNF placement, no further interventions planned for this admission, will start [facility] for DVT PPx -Discussed w/ pt's daughter, who wants her admitted to CIR. I agreed to contact the coordinator to screen her, but warned her that I don't think it would happen and that she will likely require SNF placement.   Joyice Faster Islam Villescas  07/05/19 9:26 AM

## 2019-07-05 NOTE — TOC Progression Note (Signed)
Transition of Care [facility]) - Progression Note    Patient Details  Name: Brenda Kerr MRN: [id number] Date of Birth: April 26, 1941  [facility] CM/SW Contact  Pollie Friar, RN Phone Number: 07/05/2019, 1:29 PM  Clinical Narrative:    CM spoke with pts daughter: Brenda Kerr on the phone about d/c planning. She states they do not understand why the SNF rehabs wont let visitors in like the hospital. CM explained about them trying to keep the covid numbers down. She states they do not want her going to a facility where she cant have visitiors.  Daughter requesting information about results of MRI and plans for treatment. Dr Zada Finders made aware.  Daughter requesting patient be assessed by CIR. MD updated.  TOC following.   Expected Discharge Plan: [place] Barriers to Discharge: Continued Medical Work up  Expected Discharge Plan and Services Expected Discharge Plan: [place] In-house Referral: Clinical Social Work Discharge Planning Services: CM Consult   Living arrangements for the past 2 months: Single Family Home                                       Social Determinants of Health (SDOH) Interventions    Readmission Risk Interventions No flowsheet data found.

## 2019-07-05 NOTE — Progress Notes (Signed)
Inpatient Rehab Admissions:  Per request of Dr. Zada Finders, pt was screened for appropriateness for CIR.  Note both PT/OT recommending SNF level therapy with pt currently not able to assist with out of bed mobility.  Also note medical work up complete and recommendations for follow up are all outpatient based.  Pt does not currently appear to be a candidate for CIR as she lacks the tolerance and the medical necessity for a CIR admit.   Shann Medal, PT, DPT Admissions Coordinator [phone] 07/05/19  4:07 PM

## 2019-07-05 NOTE — Progress Notes (Signed)
PROGRESS NOTE                                                                                                                                                                                                             Patient Demographics:    Brenda Kerr, is a 78 y.o. female, DOB - [birth year], RFF:[id number]  Admit date - 06/30/2019   Admitting Physician Judith Part, MD  Outpatient Primary MD for the patient is Lucianne Lei, MD  LOS - 5    Chief Complaint  Patient presents with   Back Pain   Leg Pain       Brief Narrative  78 y.o. female hypertension, arthritis, chronic constipation, severe spinal stenosis, NSAID induced esophagitis admitted for the concern of cauda equina syndrome.  Patient was evaluated by neurosurgery-no surgical intervention required.  Patient continues to have back pain, bilateral hip pain, leg pain, feet pain since couple of weeks to 1 month.  Patient reports right knee swelling since 2 to 3 weeks.  medical consult called for Pain in multiple joints, failure to thrive, right knee swelling and pain.      Subjective:   UE strength much better. LE weakness improving as well. No overnight events   Assessment  & Plan :   Right knee pain and swelling/ b/l hip pain and foot numbness  appears to be due to osteoarthritis with findings on xray. 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 Uric acid normal. ESR mildly elevated, CRP normal. Low TSH but normal free T4 and no signs or symptoms of hyperthyroidism. Primary team plan on EMG and ortho follow up as outpatient. B12 low normal (326) , will add supplement. Pending RPR. continue prn oxycodone and PT.   Multifocal spinal stenosis Associated b/l LE weakness. Plan per primary. PT recommends SNF  Grade C esophagitis  no acute symptoms. H&h stable. Iron studies normal. On protonix  hypokalemia  replaced  Essential hypertension  stable on  labetalol and aldactone  FTT in adult Poor appetite. Nutrition consulted   Code Status : full   Family Communication  :none  Disposition Plan  : SNF possibly in 1-2 days    No further medical issues to address. Will sign off. Discussed plan with Dr Zada Finders. Please feel free to contact us back if any issues.  Procedures  :  MRI spine  DVT Prophylaxis  :  Lovenox -   Lab Results  Component Value Date   PLT 288 07/04/2019    Antibiotics  :    Anti-infectives (From admission, onward)   Start     Dose/Rate Route Frequency Ordered Stop   06/30/19 1030  Ampicillin-Sulbactam (UNASYN) 3 g in sodium chloride 0.9 % 100 mL IVPB     3 g 200 mL/hr over 30 Minutes Intravenous  Once 06/30/19 1018 06/30/19 1141        Objective:   Vitals:   07/04/19 1559 07/04/19 1950 07/05/19 0006 07/05/19 0331  BP: (!) 134/56 129/68 122/67 129/67  Pulse: (!) 54 61 66 69  Resp: '16 18 18 18  ' Temp: 98.6 F (37 C) 98.4 F (36.9 C) 98.5 F (36.9 C) 98.3 F (36.8 C)  TempSrc: Oral Oral Oral Oral  SpO2: 100% 100% 100% 100%  Weight:      Height:        Wt Readings from Last 3 Encounters:  06/30/19 67.8 kg  05/18/19 68.9 kg  11/25/17 68.9 kg     Intake/Output Summary (Last 24 hours) at 07/05/2019 0928 Last data filed at 07/05/2019 0400 Gross per 24 hour  Intake 240 ml  Output 1950 ml  Net -1710 ml    Physical exam  NAD HEENT: moist mucosa, supple neck  chest: clear  CVS: NS1&S2 GI: soft, NT, ND  musculoskeletal: no tenderness in rt knee, moderate pain on movement of hips, no edema  CNS: Normal strength in UE, normal sensation in LE, 4+/5 power      Data Review:    CBC Recent Labs  Lab 06/30/19 1131 07/04/19 0324  WBC 7.2 9.9  HGB 10.5* 12.9  HCT 34.1* 40.5  PLT 272 288  MCV 92.2 90.2  MCH 28.4 28.7  MCHC 30.8 31.9  RDW 14.2 14.1    Chemistries  Recent Labs  Lab 06/30/19 1131 07/04/19 0324  NA 138 136  K 3.3* 4.4  CL 112* 105  CO2 20* 24  GLUCOSE 81  107*  BUN 19 24*  CREATININE 0.72 0.87  CALCIUM 7.3* 9.5   ------------------------------------------------------------------------------------------------------------------ No results for input(s): CHOL, HDL, LDLCALC, TRIG, CHOLHDL, LDLDIRECT in the last 72 hours.  No results found for: HGBA1C ------------------------------------------------------------------------------------------------------------------ Recent Labs    07/03/19 1409  TSH 0.276*   ------------------------------------------------------------------------------------------------------------------ Recent Labs    07/03/19 1409 07/05/19 0749  VITAMINB12  --  326  FERRITIN 48  --   TIBC 349  --   IRON 45  --     Coagulation profile No results for input(s): INR, PROTIME in the last 168 hours.  No results for input(s): DDIMER in the last 72 hours.  Cardiac Enzymes No results for input(s): CKMB, TROPONINI, MYOGLOBIN in the last 168 hours.  Invalid input(s): CK ------------------------------------------------------------------------------------------------------------------    Component Value Date/Time   BNP 25.6 01/28/2016 0453    Inpatient Medications  Scheduled Meds:  brimonidine  1 drop Both Eyes Q12H   And   timolol  1 drop Both Eyes Q12H   docusate sodium  100 mg Oral BID   dorzolamide  1 drop Both Eyes BID   feeding supplement (ENSURE ENLIVE)  237 mL Oral BID BM   labetalol  100 mg Oral BID   latanoprost  1 drop Both Eyes QHS   pantoprazole  40 mg Oral Daily   sodium chloride flush  3 mL Intravenous Q12H   spironolactone  25 mg Oral BID  Continuous Infusions:  sodium chloride     PRN Meds:.acetaminophen **OR** acetaminophen, cyclobenzaprine, diclofenac sodium, HYDROmorphone (DILAUDID) injection, menthol-cetylpyridinium **OR** phenol, ondansetron **OR** ondansetron (ZOFRAN) IV, oxyCODONE, oxyCODONE, polyethylene glycol, sodium chloride flush  Micro Results Recent Results (from  the past 240 hour(s))  SARS CORONAVIRUS 2 (TAT 6-24 HRS) Nasopharyngeal Nasopharyngeal Swab     Status: None   Collection Time: 06/30/19  5:13 PM   Specimen: Nasopharyngeal Swab  Result Value Ref Range Status   SARS Coronavirus 2 NEGATIVE NEGATIVE Final    Comment: (NOTE) SARS-CoV-2 target nucleic acids are NOT DETECTED. The SARS-CoV-2 RNA is generally detectable in upper and lower respiratory specimens during the acute phase of infection. Negative results do not preclude SARS-CoV-2 infection, do not rule out co-infections with other pathogens, and should not be used as the sole basis for treatment or other patient management decisions. Negative results must be combined with clinical observations, patient history, and epidemiological information. The expected result is Negative. Fact Sheet for Patients: SugarRoll.be Fact Sheet for Healthcare Providers: https://www.woods-mathews.com/ This test is not yet approved or cleared by the [place] FDA and  has been authorized for detection and/or diagnosis of SARS-CoV-2 by FDA under an Emergency Use Authorization (EUA). This EUA will remain  in effect (meaning this test can be used) for the duration of the COVID-19 declaration under Section 56 4(b)(1) of the Act, 21 U.S.C. section 360bbb-3(b)(1), unless the authorization is terminated or revoked sooner. Performed at [facility], [place] [address] [place], [place] [address]     Radiology Reports Dg Knee 1-2 Views Right  Result Date: 07/03/2019 CLINICAL DATA:  Right knee swelling 2-3 weeks.  No injury. EXAM: RIGHT KNEE - 1-2 VIEW COMPARISON:  None. FINDINGS: Moderate tricompartmental osteoarthritic change. No acute fracture or dislocation. No joint effusion. IMPRESSION: No acute findings. Moderate osteoarthritis. Electronically Signed   By: Marin Olp M.D.   On: 07/03/2019 13:48   Mr Lumbar Spine Wo Contrast  Result Date:  06/30/2019 CLINICAL DATA:  Acute presentation with bilateral leg pain and low back pain worsening recently. EXAM: MRI LUMBAR SPINE WITHOUT CONTRAST TECHNIQUE: Multiplanar, multisequence MR imaging of the lumbar spine was performed. No intravenous contrast was administered. COMPARISON:  Radiography 09/25/2015 FINDINGS: Segmentation:  5 lumbar type vertebral bodies assumed. Alignment: 2 mm retrolisthesis L2-3. 2 mm anterolisthesis L3-4. 3 mm anterolisthesis L4-5. Vertebrae: No recent fracture or primary bone lesion. Chronic discogenic endplate changes and superior endplate Schmorl's node at L2. Conus medullaris and cauda equina: Conus extends to the L1 level. Conus and cauda equina appear normal. Paraspinal and other soft tissues: Right renal cyst. Disc levels: T11-12 and T12-L1 were not studied in the axial plane. There shallow disc protrusions in there is facet hypertrophy at those levels. There is canal narrowing with effacement of the subarachnoid space but no visible cord compression. L1-2: Shallow disc protrusion. Mild facet and ligamentous hypertrophy. Mild stenosis of both lateral recesses but no visible neural compression. L2-3: 2 mm retrolisthesis. Disc degeneration with a shallow disc herniation in the left posterolateral direction. Facet and ligamentous hypertrophy. Stenosis of the left lateral recess and intervertebral foramen on the left that could cause neural compression. Mild right-sided lateral recess and foraminal narrowing. L3-4: 2 mm anterolisthesis. Circumferential protrusion of the disc. Facet and ligamentous hypertrophy. Severe multifactorial spinal stenosis that could cause neural compression on either or both sides. L4-5: 3 mm of anterolisthesis. Severe bilateral facet arthropathy with facet and ligamentous hypertrophy. Broad-based herniation the disc. Severe spinal stenosis  likely to cause neural compression on both sides. Bilateral foraminal stenosis. L5-S1: Bulging of the disc. Facet and  ligamentous hypertrophy worse on the right. No compressive canal stenosis. Bilateral foraminal narrowing right more than left with some potential to affect in particular the right L5 nerve. IMPRESSION: 1. Severe multifactorial spinal stenosis worse at L4-5 than L3-4 that could cause neural compression on either or both sides. 2. Widespread lesser but potentially significant degenerative changes at other levels as outlined above. Electronically Signed   By: Nelson Chimes M.D.   On: 06/30/2019 08:35   Mr Hip Right Wo Contrast  Result Date: 07/05/2019 CLINICAL DATA:  Chronic hip pain.  Bilateral total hip prostheses. EXAM: MR OF THE LEFT HIP WITHOUT CONTRAST MRI OF THE RIGHT HIP WITHOUT CONTRAST TECHNIQUE: Multiplanar, multisequence MR imaging was performed. No intravenous contrast was administered. COMPARISON:  Radiographs from 07/04/2019 and CT hip from 05/23/2019 FINDINGS: Unfortunately the patient only tolerated acquisition of a sagittally oriented STIR series before terminating the exam. Accordingly, only a small subset of the request examination could be performed. This reduces diagnostic sensitivity and specificity. BILATERAL HIPS: Bones: Corresponding to the abnormal lucency in the right upper acetabulum shown on recent CT scan, we demonstrate a 4.3 by 1.4 cm fluid signal intensity favoring particulate disease given the combination of findings. One of the acetabular shell fixation screws appears to extend through this cystic lesion were see other to right-sided screws are more posteriorly located. No similar lucency is identified on the left. No significant abnormal osseous edema is noted in the proximal femurs or long the stems of the femoral components of the total hip prostheses. There is low-level edema signal anteriorly in the pubic bones with associated spurring, favoring minimal osteitis pubis or degenerative arthropathy along the pubis. Only a small portion of the sacrum was included on today's exam.  Articular cartilage and labrum Articular cartilage:  N/A Labrum:  N/A Joint or bursal effusion Joint effusion:  Absent Bursae: No significant regional bursitis or abnormal periarticular fluid collection in the soft tissues noted. Muscles and tendons Muscles and tendons: Visualized part of the hamstring tendons appears intact. Low-grade asymmetric edema signal in the right iliacus muscle for example on images 6 through thirteen of series 6. Other findings Miscellaneous: Visualized portion of the urinary bladder unremarkable. IMPRESSION: 1. 4.3 by 1.4 cm fluid signal intensity structure corresponding to the abnormal lucent lesion on recent CT scan in the right upper acetabulum, favoring particulate disease. One of the acetabular shell fixation screws appears to extend through this cystic lesion. 2. There is also some adjacent low-grade asymmetric edema in the right iliacus muscle. 3. No specific abnormality is identified on the left side. 4. Low-grade edema and spurring along the pubic symphysis favoring minimal osteitis pubis or degenerative arthropathy. 5. Please note that the patient only tolerated one diagnostic series before terminating the exam, which reduces diagnostic sensitivity and specificity. Electronically Signed   By: Van Clines M.D.   On: 07/05/2019 08:07   Mr Hip Left Wo Contrast  Result Date: 07/05/2019 CLINICAL DATA:  Chronic hip pain.  Bilateral total hip prostheses. EXAM: MR OF THE LEFT HIP WITHOUT CONTRAST MRI OF THE RIGHT HIP WITHOUT CONTRAST TECHNIQUE: Multiplanar, multisequence MR imaging was performed. No intravenous contrast was administered. COMPARISON:  Radiographs from 07/04/2019 and CT hip from 05/23/2019 FINDINGS: Unfortunately the patient only tolerated acquisition of a sagittally oriented STIR series before terminating the exam. Accordingly, only a small subset of the request examination could be performed.  This reduces diagnostic sensitivity and specificity. BILATERAL  HIPS: Bones: Corresponding to the abnormal lucency in the right upper acetabulum shown on recent CT scan, we demonstrate a 4.3 by 1.4 cm fluid signal intensity favoring particulate disease given the combination of findings. One of the acetabular shell fixation screws appears to extend through this cystic lesion were see other to right-sided screws are more posteriorly located. No similar lucency is identified on the left. No significant abnormal osseous edema is noted in the proximal femurs or long the stems of the femoral components of the total hip prostheses. There is low-level edema signal anteriorly in the pubic bones with associated spurring, favoring minimal osteitis pubis or degenerative arthropathy along the pubis. Only a small portion of the sacrum was included on today's exam. Articular cartilage and labrum Articular cartilage:  N/A Labrum:  N/A Joint or bursal effusion Joint effusion:  Absent Bursae: No significant regional bursitis or abnormal periarticular fluid collection in the soft tissues noted. Muscles and tendons Muscles and tendons: Visualized part of the hamstring tendons appears intact. Low-grade asymmetric edema signal in the right iliacus muscle for example on images 6 through thirteen of series 6. Other findings Miscellaneous: Visualized portion of the urinary bladder unremarkable. IMPRESSION: 1. 4.3 by 1.4 cm fluid signal intensity structure corresponding to the abnormal lucent lesion on recent CT scan in the right upper acetabulum, favoring particulate disease. One of the acetabular shell fixation screws appears to extend through this cystic lesion. 2. There is also some adjacent low-grade asymmetric edema in the right iliacus muscle. 3. No specific abnormality is identified on the left side. 4. Low-grade edema and spurring along the pubic symphysis favoring minimal osteitis pubis or degenerative arthropathy. 5. Please note that the patient only tolerated one diagnostic series before  terminating the exam, which reduces diagnostic sensitivity and specificity. Electronically Signed   By: Van Clines M.D.   On: 07/05/2019 08:07   Dg Chest Portable 1 View  Result Date: 06/30/2019 CLINICAL DATA:  Preoperative examination (back surgery). History of diabetes and hypertension. EXAM: PORTABLE CHEST 1 VIEW COMPARISON:  01/27/2016 FINDINGS: Grossly unchanged cardiac silhouette and mediastinal contours with atherosclerotic plaque within the thoracic aorta. No focal airspace opacities. No pleural effusion or pneumothorax. No evidence of edema. There is a minimal amount of pleuroparenchymal thickening about the right minor fissure. No definite acute osseous abnormalities. IMPRESSION: No acute cardiopulmonary disease on this AP portable examination. Further evaluation with a PA and lateral chest radiograph may be obtained as clinically indicated. Electronically Signed   By: Sandi Mariscal M.D.   On: 06/30/2019 11:55    Time Spent in minutes  25   Caylor Cerino M.D on 07/05/2019 at 9:28 AM  Between 7am to 7pm - Pager - [phone]  After 7pm go to [email or web address] - password [facility]  [facility] -  Office  [phone]

## 2019-07-06 NOTE — Progress Notes (Signed)
Neurosurgery Service Progress Note  Subjective: No acute events overnight, no new complaints  Objective: Vitals:   07/05/19 2014 07/05/19 2307 07/06/19 0406 07/06/19 0739  BP: (!) 153/68 (!) 152/69 (!) 135/55 (!) 142/65  Pulse: 69 66 61 62  Resp: _0 Temp: 97.8 F (36.6 C) 98.5 F (36.9 C) 97.8 F (36.6 C) 98.3 F (36.8 C)  TempSrc: Oral Oral Oral Oral  SpO2: 98% 100% 100% 99%  Weight:      Height:       Temp (24hrs), Avg:98.2 F (36.8 C), Min:97.8 F (36.6 C), Max:98.5 F (36.9 C)  CBC Latest Ref Rng & Units 07/04/2019 06/30/2019 05/23/2019  WBC 4.0 - 10.5 K/uL 9.9 7.2 7.9  Hemoglobin 12.0 - 15.0 g/dL 12.9 10.5(L) 12.9  Hematocrit 36.0 - 46.0 % 40.5 34.1(L) 40.9  Platelets 150 - 400 K/uL 288 272 256   BMP Latest Ref Rng & Units 07/04/2019 06/30/2019 05/23/2019  Glucose 70 - 99 mg/dL 107(H) 81 99  BUN 8 - 23 mg/dL 24(H) 19 20  Creatinine 0.44 - 1.00 mg/dL 0.87 0.72 0.94  Sodium 135 - 145 mmol/L 136 138 140  Potassium 3.5 - 5.1 mmol/L 4.4 3.3(L) 4.1  Chloride 98 - 111 mmol/L 105 112(H) 107  CO2 22 - 32 mmol/L 24 20(L) 23  Calcium 8.9 - 10.3 mg/dL 9.5 7.3(L) 9.9    Intake/Output Summary (Last 24 hours) at 07/06/2019 0936 Last data filed at 07/06/2019 0900 Gross per 24 hour  Intake 350 ml  Output 1050 ml  Net -700 ml    Current Facility-Administered Medications:  .  0.9 %  sodium chloride infusion, 250 mL, Intravenous, Continuous, Destenee Guerry, Joyice Faster, MD .  acetaminophen (TYLENOL) tablet 650 mg, 650 mg, Oral, Q4H PRN, 650 mg at 07/05/19 2101 **OR** acetaminophen (TYLENOL) suppository 650 mg, 650 mg, Rectal, Q4H PRN, Judith Part, MD .  brimonidine (ALPHAGAN) 0.2 % ophthalmic solution 1 drop, 1 drop, Both Eyes, Q12H, 1 drop at 07/05/19 2130 **AND** timolol (TIMOPTIC) 0.5 % ophthalmic solution 1 drop, 1 drop, Both Eyes, Q12H, Halo Shevlin A, MD, 1 drop at 07/05/19 2130 .  cyclobenzaprine (FLEXERIL) tablet 10 mg, 10 mg, Oral, TID PRN, Judith Part, MD, 10 mg at 07/05/19 2312 .  diclofenac sodium (VOLTAREN) 1 % transdermal gel 2 g, 2 g, Topical, QID PRN, Judith Part, MD .  docusate sodium (COLACE) capsule 100 mg, 100 mg, Oral, BID, Judith Part, MD, 100 mg at 07/05/19 2129 .  dorzolamide (TRUSOPT) 2 % ophthalmic solution 1 drop, 1 drop, Both Eyes, BID, Shanie Mauzy, Joyice Faster, MD, 1 drop at 07/05/19 2130 .  feeding supplement (ENSURE ENLIVE) (ENSURE ENLIVE) liquid 237 mL, 237 mL, Oral, BID BM, Gaynor Genco A, MD .  heparin injection 5,000 Units, 5,000 Units, Subcutaneous, Q8H, Judith Part, MD, 5,000 Units at 07/06/19 0515 .  HYDROmorphone (DILAUDID) injection 0.5 mg, 0.5 mg, Intravenous, Q3H PRN, Judith Part, MD, 0.5 mg at 06/30/19 2251 .  ibuprofen (ADVIL) tablet 400 mg, 400 mg, Oral, Q6H PRN, Judith Part, MD .  labetalol (NORMODYNE) tablet 100 mg, 100 mg, Oral, BID, Judith Part, MD, 100 mg at 07/05/19 2130 .  latanoprost (XALATAN) 0.005 % ophthalmic solution 1 drop, 1 drop, Both Eyes, QHS, Christion Leonhard A, MD, 1 drop at 07/05/19 2130 .  menthol-cetylpyridinium (CEPACOL) lozenge 3 mg, 1 lozenge, Oral, PRN **OR** phenol (CHLORASEPTIC) mouth spray 1 spray, 1 spray, Mouth/Throat, PRN, Judith Part, MD .  ondansetron (ZOFRAN) tablet 4 mg, 4 mg, Oral, Q6H PRN, 4 mg at 07/05/19 1013 **OR** ondansetron (ZOFRAN) injection 4 mg, 4 mg, Intravenous, Q6H PRN, Judith Part, MD, 4 mg at 07/04/19 1713 .  oxyCODONE (Oxy IR/ROXICODONE) immediate release tablet 10 mg, 10 mg, Oral, Q4H PRN, Judith Part, MD, 10 mg at 07/05/19 2312 .  oxyCODONE (Oxy IR/ROXICODONE) immediate release tablet 5 mg, 5 mg, Oral, Q4H PRN, Judith Part, MD, 5 mg at 07/02/19 2101 .  pantoprazole (PROTONIX) EC tablet 40 mg, 40 mg, Oral, Daily, Pahwani, Rinka R, MD, 40 mg at 07/05/19 0957 .  polyethylene glycol (MIRALAX / GLYCOLAX) packet 17 g, 17 g, Oral, Daily PRN, Judith Part, MD, 17 g at 07/03/19  1536 .  sodium chloride flush (NS) 0.9 % injection 3 mL, 3 mL, Intravenous, Q12H, Laysa Kimmey, Joyice Faster, MD, 3 mL at 07/05/19 2131 .  sodium chloride flush (NS) 0.9 % injection 3 mL, 3 mL, Intravenous, PRN, Judith Part, MD .  spironolactone (ALDACTONE) tablet 25 mg, 25 mg, Oral, BID, Axavier Pressley, Joyice Faster, MD, 25 mg at 07/05/19 1852 .  vitamin B-12 (CYANOCOBALAMIN) tablet 1,000 mcg, 1,000 mcg, Oral, Daily, Dhungel, Nishant, MD, 1,000 mcg at 07/05/19 1013   Physical Exam: AOx1, PERRL, EOMI, FS, Strength 5/5 pain-limited x4   Assessment & Plan: 78 y.o. woman transferred for concern for cauda equina syndrome, no complaints c/w or exam evidence of cauda equina. Main complaint is foot numbness and b/l hip pain.  Neuro / Hand/foot numbness: -has multiple areas of spinal stenosis - cervical / thoracic / lumbar, but atypical exam for any spinal pathology, will need EMG/NCV as outpatient to determine if peripheral or spinal  Medicine rec: likely osteo, normal ESR/CRP/urate, repleted hypokalemia, no other active issues  Ortho: rec'd b/l MARS MRI hips, which showed R sided particle dz, will need outpatient follow up -pain improved today post steroids + NSAIDs, cont home PPI, NSAIDs x3d  Dispo/PPx: -PT/OT rec SNF, daughter requested CIR screening, which confirmed she is not a candidate, needs SNF placement  -SQH for DVT PPx  Judith Part  07/06/19 9:36 AM

## 2019-07-06 NOTE — NC FL2 (Signed)
[place] LEVEL OF CARE SCREENING TOOL     IDENTIFICATION  Patient Name: Brenda Kerr Birthdate: 08-26-1941 Sex: female Admission Date (Current Location): 06/30/2019  [employer] [employer] and [place] Number:  [employer] and Address:  [facility], [place] [address] [place], [place] [address]      Provider Number: [id number]  Attending Physician Name and Address:  Judith Part, MD  Relative Name and Phone Number:  Ronald Modglin [phone]    Current Level of Care: Hospital Recommended Level of Care: [place] Prior Approval Number:    Date Approved/Denied:   PASRR Number: [id number]  Discharge Plan: SNF    Current Diagnoses: Patient Active Problem List   Diagnosis Date Noted  . Pain and swelling of right knee 07/03/2019  . Hypokalemia 07/03/2019  . Spinal stenosis 07/03/2019  . Esophagitis 07/03/2019  . Cauda equina compression ([place]) 06/30/2019  . Venous stasis ulcers of both lower extremities ([place]) 02/02/2016  . Acute blood loss anemia 02/02/2016  . Esophageal ulcer with bleeding 02/01/2016  . Diastolic CHF ([place]) [id number]  . Anemia 01/31/2016  . Falls 01/31/2016  . Essential hypertension 01/31/2016  . Venous stasis 01/30/2016  . Physical deconditioning 01/30/2016  . Cellulitis of right leg 01/28/2016  . HTN (hypertension) 01/28/2016  . Abnormality of gait 04/19/2012  . Hip pain 04/19/2012  . Hip stiffness 04/19/2012    Orientation RESPIRATION BLADDER Height & Weight     Self, Place  Normal Incontinent, Continent(Sometimes incontinent) Weight: 149 lb 7.6 oz (67.8 kg) Height:  5\' 2"  (157.5 cm)  BEHAVIORAL SYMPTOMS/MOOD NEUROLOGICAL BOWEL NUTRITION STATUS      Continent Diet(Regular)  AMBULATORY STATUS COMMUNICATION OF NEEDS Skin   Extensive Assist Verbally Skin abrasions(Abdomen and sacrum. Foam dressing)                       Personal Care Assistance Level of Assistance  Dressing,  Bathing, Feeding Bathing Assistance: Maximum assistance Feeding assistance: Limited assistance Dressing Assistance: Maximum assistance     Functional Limitations Info  Sight Sight Info: Impaired        SPECIAL CARE FACTORS FREQUENCY  PT (By licensed PT), OT (By licensed OT)     PT Frequency: 5x/week OT Frequency: 5x/week            Contractures Contractures Info: Not present    Additional Factors Info  Code Status, Allergies Code Status Info: Full Code Allergies Info: Codeine           Current Medications (07/06/2019):  This is the current hospital active medication list Current Facility-Administered Medications  Medication Dose Route Frequency Provider Last Rate Last Dose  . 0.9 %  sodium chloride infusion  250 mL Intravenous Continuous Judith Part, MD      . acetaminophen (TYLENOL) tablet 650 mg  650 mg Oral Q4H PRN Judith Part, MD   650 mg at 07/05/19 2101   Or  . acetaminophen (TYLENOL) suppository 650 mg  650 mg Rectal Q4H PRN Judith Part, MD      . brimonidine (ALPHAGAN) 0.2 % ophthalmic solution 1 drop  1 drop Both Eyes Q12H Judith Part, MD   1 drop at 07/06/19 0937   And  . timolol (TIMOPTIC) 0.5 % ophthalmic solution 1 drop  1 drop Both Eyes Q12H Judith Part, MD   1 drop at 07/06/19 0937  . cyclobenzaprine (FLEXERIL) tablet 10 mg  10 mg Oral TID  PRN Judith Part, MD   10 mg at 07/05/19 2312  . diclofenac sodium (VOLTAREN) 1 % transdermal gel 2 g  2 g Topical QID PRN Judith Part, MD      . docusate sodium (COLACE) capsule 100 mg  100 mg Oral BID Judith Part, MD   100 mg at 07/06/19 0936  . dorzolamide (TRUSOPT) 2 % ophthalmic solution 1 drop  1 drop Both Eyes BID Judith Part, MD   1 drop at 07/06/19 0937  . feeding supplement (ENSURE ENLIVE) (ENSURE ENLIVE) liquid 237 mL  237 mL Oral BID BM Judith Part, MD   237 mL at 07/06/19 1434  . heparin injection 5,000 Units  5,000 Units  Subcutaneous Q8H Judith Part, MD   5,000 Units at 07/06/19 1434  . HYDROmorphone (DILAUDID) injection 0.5 mg  0.5 mg Intravenous Q3H PRN Judith Part, MD   0.5 mg at 06/30/19 2251  . ibuprofen (ADVIL) tablet 400 mg  400 mg Oral Q6H PRN Judith Part, MD      . labetalol (NORMODYNE) tablet 100 mg  100 mg Oral BID Judith Part, MD   100 mg at 07/06/19 0936  . latanoprost (XALATAN) 0.005 % ophthalmic solution 1 drop  1 drop Both Eyes QHS Judith Part, MD   1 drop at 07/05/19 2130  . menthol-cetylpyridinium (CEPACOL) lozenge 3 mg  1 lozenge Oral PRN Judith Part, MD       Or  . phenol (CHLORASEPTIC) mouth spray 1 spray  1 spray Mouth/Throat PRN Judith Part, MD      . ondansetron (ZOFRAN) tablet 4 mg  4 mg Oral Q6H PRN Judith Part, MD   4 mg at 07/05/19 1013   Or  . ondansetron (ZOFRAN) injection 4 mg  4 mg Intravenous Q6H PRN Judith Part, MD   4 mg at 07/04/19 1713  . oxyCODONE (Oxy IR/ROXICODONE) immediate release tablet 10 mg  10 mg Oral Q4H PRN Judith Part, MD   10 mg at 07/05/19 2312  . oxyCODONE (Oxy IR/ROXICODONE) immediate release tablet 5 mg  5 mg Oral Q4H PRN Judith Part, MD   5 mg at 07/02/19 2101  . pantoprazole (PROTONIX) EC tablet 40 mg  40 mg Oral Daily Pahwani, Rinka R, MD   40 mg at 07/06/19 0936  . polyethylene glycol (MIRALAX / GLYCOLAX) packet 17 g  17 g Oral Daily PRN Judith Part, MD   17 g at 07/03/19 1536  . sodium chloride flush (NS) 0.9 % injection 3 mL  3 mL Intravenous Q12H Judith Part, MD   3 mL at 07/06/19 0939  . sodium chloride flush (NS) 0.9 % injection 3 mL  3 mL Intravenous PRN Judith Part, MD      . spironolactone (ALDACTONE) tablet 25 mg  25 mg Oral BID Judith Part, MD   25 mg at 07/06/19 0936  . vitamin B-12 (CYANOCOBALAMIN) tablet 1,000 mcg  1,000 mcg Oral Daily Dhungel, Nishant, MD   1,000 mcg at 07/06/19 [id number]     Discharge Medications: Please see  discharge summary for a list of discharge medications.  Relevant Imaging Results:  Relevant Lab Results:   Additional Information ss# [id number]  Kirstie Peri, Student-Social Work

## 2019-07-06 NOTE — TOC Progression Note (Signed)
Transition of Care [facility]) - Progression Note    Patient Details  Name: Brenda Kerr MRN: [id number] Date of Birth: [birth year]  Transition of Care [facility]) CM/SW [place], [place] Work Phone Number: 07/06/2019, 3:21 PM  Clinical Narrative:      MSW Intern was advised by MD that family was now agreeable to SNF. MSW Intern went to speak with pt and husband, and was asked to call daughter with further information. Daughter was called and was advised that pt's information could be faxed out to see which SNF's would offer a bed. Daughter was concerned about visitation, MSW Intern told her that SNF's could be identified with window visitation. She did not seem to like that option, but stated it was ok to proceed with seeing what the options were. FL2 was completed, Pt has been faxed out. Social Work will follow.   Expected Discharge Plan: [place] Barriers to Discharge: Continued Medical Work up  Expected Discharge Plan and Services Expected Discharge Plan: [place] In-house Referral: Clinical Social Work Discharge Planning Services: CM Consult   Living arrangements for the past 2 months: Single Family Home                                       Social Determinants of Health (SDOH) Interventions    Readmission Risk Interventions No flowsheet data found.

## 2019-07-06 NOTE — Plan of Care (Signed)
Patient progressing towards plan of care goals. 

## 2019-07-06 NOTE — Progress Notes (Signed)
Dr Cato Mulligan given telephone order that pt's daughter can came to seem her mother at this time in the unit.

## 2019-07-06 NOTE — Progress Notes (Signed)
Physical Therapy Treatment Patient Details Name: Brenda Kerr MRN: [id number] DOB: 1941/07/30 Today's Date: 07/06/2019    History of Present Illness  78 y.o. woman with DM, PVD, B THA, glaucoma, GERD, and HTN who presents with severe bilateral hip and back pain. MRI C/T/L  shows multiple areas of stenosis and severe degenerative disease. In the lumbar spine, it is worst at L3-4 and L4-5. In the thoracic spine, there is some moderate stenosis at T10-11 and T11-12. In the cervical spine, there is moderate to moderately severe stenosis at C3-4. Original concern for cauda equina but that has been ruled out.     PT Comments    Pt with improved sitting balance and trunk control today.  She progressed from MOD A to MIN/guard of 2.  Con't to recommend SNF.   Follow Up Recommendations  SNF;Supervision/Assistance - 24 hour     Equipment Recommendations  None recommended by PT    Recommendations for Other Services       Precautions / Restrictions Precautions Precautions: Fall Precaution Comments: L eye blind; glaucoma in right eye Restrictions Weight Bearing Restrictions: No    Mobility  Bed Mobility Overal bed mobility: Needs Assistance Bed Mobility: Supine to Sit;Sit to Supine     Supine to sit: Max assist;+2 for physical assistance Sit to supine: Total assist   General bed mobility comments: Pt unable to grasp rail today to hold onto.  Hand over hand contact to the rail and then would be unable to maintain grasp.    Transfers                    Ambulation/Gait                 Stairs             Wheelchair Mobility    Modified Rankin (Stroke Patients Only)       Balance   Sitting-balance support: Feet supported Sitting balance-Leahy Scale: Poor Sitting balance - Comments: Pt sat EOB for 9 minutes starting out at MOD A and progressing to MIN/guard for last 5-6 minutes with frequent cueing for positioning and for righting reactions when starting  to lose balance. Postural control: Posterior lean;Right lateral lean     Standing balance comment: unable                            Cognition Arousal/Alertness: Awake/alert Behavior During Therapy: WFL for tasks assessed/performed Overall Cognitive Status: Within Functional Limits for tasks assessed                                        Exercises      General Comments        Pertinent Vitals/Pain Pain Assessment: Faces Faces Pain Scale: Hurts little more Pain Location: no area specified Pain Descriptors / Indicators: Grimacing Pain Intervention(s): Limited activity within patient's tolerance;Monitored during session;Repositioned    Home Living                      Prior Function            PT Goals (current goals can now be found in the care plan section) Acute Rehab PT Goals Potential to Achieve Goals: Fair Progress towards PT goals: Progressing toward goals    Frequency    Min 2X/week  PT Plan Current plan remains appropriate    Co-evaluation              AM-PAC PT "6 Clicks" Mobility   Outcome Measure  Help needed turning from your back to your side while in a flat bed without using bedrails?: Total Help needed moving from lying on your back to sitting on the side of a flat bed without using bedrails?: Total Help needed moving to and from a bed to a chair (including a wheelchair)?: Total Help needed standing up from a chair using your arms (e.g., wheelchair or bedside chair)?: Total Help needed to walk in hospital room?: Total Help needed climbing 3-5 steps with a railing? : Total 6 Click Score: 6    End of Session   Activity Tolerance: Patient tolerated treatment well Patient left: in bed;with call bell/phone within reach;with chair alarm set;with family/visitor present   PT Visit Diagnosis: Muscle weakness (generalized) (M62.81);Difficulty in walking, not elsewhere classified (R26.2)     Time:  [id number] PT Time Calculation (min) (ACUTE ONLY): 21 min  Charges:  $Therapeutic Activity: 8-22 mins                     Brenda Kerr L. Brenda Kerr, [place] Pager [phone] 07/06/2019    Brenda Kerr 07/06/2019, 2:28 PM

## 2019-07-07 DIAGNOSIS — L8990 Pressure ulcer of unspecified site, unspecified stage: Secondary | ICD-10-CM | POA: Insufficient documentation

## 2019-07-07 NOTE — Plan of Care (Signed)
Patient progressing towards plan of care goals. 

## 2019-07-07 NOTE — Progress Notes (Signed)
Patient's daughter [name] wants to call in her sell phone [phone] incase pt wants to talk to her.  [name] [phone]

## 2019-07-07 NOTE — Progress Notes (Signed)
Neurosurgery Service Progress Note  Subjective: No acute events overnight, hip pain improved today, better response to Tx on the left than right  Objective: Vitals:   07/06/19 1949 07/06/19 2354 07/07/19 0313 07/07/19 0747  BP: (!) 121/57 (!) 144/57 (!) 118/53 132/86  Pulse: 77 65 67 72  Resp: _0 Temp: 98.3 F (36.8 C) 97.9 F (36.6 C) 98.1 F (36.7 C) 97.8 F (36.6 C)  TempSrc: Oral Oral Oral Oral  SpO2: 99% 100% 100% 100%  Weight:      Height:       Temp (24hrs), Avg:98.3 F (36.8 C), Min:97.8 F (36.6 C), Max:99 F (37.2 C)  CBC Latest Ref Rng & Units 07/04/2019 06/30/2019 05/23/2019  WBC 4.0 - 10.5 K/uL 9.9 7.2 7.9  Hemoglobin 12.0 - 15.0 g/dL 12.9 10.5(L) 12.9  Hematocrit 36.0 - 46.0 % 40.5 34.1(L) 40.9  Platelets 150 - 400 K/uL 288 272 256   BMP Latest Ref Rng & Units 07/04/2019 06/30/2019 05/23/2019  Glucose 70 - 99 mg/dL 107(H) 81 99  BUN 8 - 23 mg/dL 24(H) 19 20  Creatinine 0.44 - 1.00 mg/dL 0.87 0.72 0.94  Sodium 135 - 145 mmol/L 136 138 140  Potassium 3.5 - 5.1 mmol/L 4.4 3.3(L) 4.1  Chloride 98 - 111 mmol/L 105 112(H) 107  CO2 22 - 32 mmol/L 24 20(L) 23  Calcium 8.9 - 10.3 mg/dL 9.5 7.3(L) 9.9    Intake/Output Summary (Last 24 hours) at 07/07/2019 0917 Last data filed at 07/06/2019 2100 Gross per 24 hour  Intake 580 ml  Output 700 ml  Net -120 ml    Current Facility-Administered Medications:  .  0.9 %  sodium chloride infusion, 250 mL, Intravenous, Continuous, Quantarius Genrich, Joyice Faster, MD .  acetaminophen (TYLENOL) tablet 650 mg, 650 mg, Oral, Q4H PRN, 650 mg at 07/05/19 2101 **OR** acetaminophen (TYLENOL) suppository 650 mg, 650 mg, Rectal, Q4H PRN, Judith Part, MD .  brimonidine (ALPHAGAN) 0.2 % ophthalmic solution 1 drop, 1 drop, Both Eyes, Q12H, 1 drop at 07/06/19 2151 **AND** timolol (TIMOPTIC) 0.5 % ophthalmic solution 1 drop, 1 drop, Both Eyes, Q12H, Ronda Kazmi, Joyice Faster, MD, 1 drop at 07/06/19 2144 .  cyclobenzaprine (FLEXERIL) tablet  10 mg, 10 mg, Oral, TID PRN, Judith Part, MD, 10 mg at 07/05/19 2312 .  diclofenac sodium (VOLTAREN) 1 % transdermal gel 2 g, 2 g, Topical, QID PRN, Judith Part, MD .  docusate sodium (COLACE) capsule 100 mg, 100 mg, Oral, BID, Giulio Bertino A, MD, 100 mg at 07/06/19 2137 .  dorzolamide (TRUSOPT) 2 % ophthalmic solution 1 drop, 1 drop, Both Eyes, BID, Jarious Lyon, Joyice Faster, MD, 1 drop at 07/06/19 2139 .  feeding supplement (ENSURE ENLIVE) (ENSURE ENLIVE) liquid 237 mL, 237 mL, Oral, BID BM, Merlon Alcorta A, MD, 237 mL at 07/06/19 1434 .  heparin injection 5,000 Units, 5,000 Units, Subcutaneous, Q8H, Judith Part, MD, 5,000 Units at 07/07/19 [phone] .  HYDROmorphone (DILAUDID) injection 0.5 mg, 0.5 mg, Intravenous, Q3H PRN, Judith Part, MD, 0.5 mg at 06/30/19 2251 .  ibuprofen (ADVIL) tablet 400 mg, 400 mg, Oral, Q6H PRN, Judith Part, MD .  labetalol (NORMODYNE) tablet 100 mg, 100 mg, Oral, BID, Judith Part, MD, 100 mg at 07/06/19 2137 .  latanoprost (XALATAN) 0.005 % ophthalmic solution 1 drop, 1 drop, Both Eyes, QHS, Hanz Winterhalter A, MD, 1 drop at 07/06/19 2141 .  menthol-cetylpyridinium (CEPACOL) lozenge 3 mg, 1 lozenge, Oral, PRN **OR** phenol (  CHLORASEPTIC) mouth spray 1 spray, 1 spray, Mouth/Throat, PRN, Judith Part, MD .  ondansetron (ZOFRAN) tablet 4 mg, 4 mg, Oral, Q6H PRN, 4 mg at 07/05/19 1013 **OR** ondansetron (ZOFRAN) injection 4 mg, 4 mg, Intravenous, Q6H PRN, Judith Part, MD, 4 mg at 07/06/19 1815 .  oxyCODONE (Oxy IR/ROXICODONE) immediate release tablet 10 mg, 10 mg, Oral, Q4H PRN, Judith Part, MD, 10 mg at 07/05/19 2312 .  oxyCODONE (Oxy IR/ROXICODONE) immediate release tablet 5 mg, 5 mg, Oral, Q4H PRN, Judith Part, MD, 5 mg at 07/02/19 2101 .  pantoprazole (PROTONIX) EC tablet 40 mg, 40 mg, Oral, Daily, Pahwani, Rinka R, MD, 40 mg at 07/06/19 0936 .  polyethylene glycol (MIRALAX / GLYCOLAX) packet  17 g, 17 g, Oral, Daily PRN, Judith Part, MD, 17 g at 07/03/19 1536 .  sodium chloride flush (NS) 0.9 % injection 3 mL, 3 mL, Intravenous, Q12H, Aeriel Boulay A, MD, 3 mL at 07/06/19 2200 .  sodium chloride flush (NS) 0.9 % injection 3 mL, 3 mL, Intravenous, PRN, Judith Part, MD .  spironolactone (ALDACTONE) tablet 25 mg, 25 mg, Oral, BID, Emilly Lavey, Joyice Faster, MD, 25 mg at 07/06/19 1621 .  vitamin B-12 (CYANOCOBALAMIN) tablet 1,000 mcg, 1,000 mcg, Oral, Daily, Dhungel, Nishant, MD, 1,000 mcg at 07/06/19 6803   Physical Exam: AOx1, PERRL, EOMI, FS, Strength 5/5 pain-limited x4  Pain limited ROM in b/l hips, tender at b/l hips / knees / ankles  Assessment & Plan: 78 y.o. woman transferred for concern for cauda equina syndrome, no complaints c/w or exam evidence of cauda equina. Main complaint is foot numbness and b/l hip pain.  Neuro / Hand/foot numbness: -has multiple areas of spinal stenosis - cervical / thoracic / lumbar, but atypical exam for any spinal pathology, will need EMG/NCV as outpatient to determine if peripheral or spinal  Medicine rec: likely osteo, normal ESR/CRP/urate, repleted hypokalemia, no other active issues  Ortho: rec'd b/l MARS MRI hips, which showed R sided particle dz, will need outpatient follow up -pain improved post steroids + NSAIDs, cont home PPI, NSAIDs x3d  Dispo/PPx: -PT/OT rec SNF, daughter requested CIR screening, which confirmed she is not a candidate, needs SNF placement  -SQH for DVT PPx  Judith Part  07/07/19 9:17 AM

## 2019-07-08 LAB — GLUCOSE, CAPILLARY: Glucose-Capillary: 106 mg/dL — ABNORMAL HIGH (ref 70–99)

## 2019-07-08 MED ORDER — BISACODYL 5 MG PO TBEC
5.0000 mg | DELAYED_RELEASE_TABLET | Freq: Every day | ORAL | Status: DC | PRN
  Administered 2019-07-08: 5 mg via ORAL
  Filled 2019-07-08: qty 1

## 2019-07-08 NOTE — TOC Progression Note (Signed)
Transition of Care [facility]) - Progression Note    Patient Details  Name: Brenda Kerr MRN: [id number] Date of Birth: [birth year]  Transition of Care [facility]) CM/SW [place], [place] Phone Number: 07/08/2019, 9:05 AM  Clinical Narrative:   CSW spoke with patient's daughter, [name], earlier today to discuss bed offers. Patient had been faxed out in [place] area, and [name] reported preference for [facility] instead. CSW provided bed offer of [facility] to [name], and she asked again about visitation because the family is worried about how the patient will do without having anyone able to come and see her. CSW asked [facility] admissions about visitation, and they will do window and FaceTime visits. CSW faxed patient out in [place].  CSW contacted [name] later in the afternoon to provide updated bed offers. [name] again asked about visitation, and CSW informed her again that none of the facilities are allowing inside visitation at this time for safety. [name] expressed frustration and disappointment, but understanding. [name] to review bed offers and contact CSW back with choice.    Expected Discharge Plan: [place] Barriers to Discharge: Continued Medical Work up  Expected Discharge Plan and Services Expected Discharge Plan: [place] In-house Referral: Clinical Social Work Discharge Planning Services: CM Consult   Living arrangements for the past 2 months: Single Family Home                                       Social Determinants of Health (SDOH) Interventions    Readmission Risk Interventions No flowsheet data found.

## 2019-07-08 NOTE — Progress Notes (Signed)
Patient ID: Brenda Kerr, female   DOB: 07-22-1941, 78 y.o.   MRN: [id number] Patient stable still with delirium confusion  Neurologically moves all extremities well and at her baseline.  Agree with PT OT rehab SNF placement

## 2019-07-08 NOTE — TOC Progression Note (Signed)
Transition of Care [facility]) - Progression Note    Patient Details  Name: Brenda Kerr MRN: [id number] Date of Birth: 1941-06-03  Transition of Care [facility]) CM/SW [place], LCSW Phone Number: 07/08/2019, 3:41 PM  Clinical Narrative:   CSW contacted patients daughter via phone to follow up on bed offers given to the daughter. CSW was unable to get a hold of family. CSW left voicemail for daughter to contact CSW back with decision     Expected Discharge Plan: [place] Barriers to Discharge: Continued Medical Work up  Expected Discharge Plan and Services Expected Discharge Plan: [place] In-house Referral: Clinical Social Work Discharge Planning Services: CM Consult   Living arrangements for the past 2 months: Single Family Home                                       Social Determinants of Health (SDOH) Interventions    Readmission Risk Interventions No flowsheet data found.

## 2019-07-09 NOTE — TOC Progression Note (Signed)
Transition of Care [facility]) - Progression Note    Patient Details  Name: Brenda Kerr MRN: [id number] Date of Birth: [birth year]  Transition of Care [facility]) CM/SW [place], [place] Phone Number: 07/09/2019, 11:11 AM  Clinical Narrative:     CSW called and spoke with [name]. She has chosen [facility] for the patient. She had not other questions or concerns.   CSW will continue to follow and assist with disposition planning.   Expected Discharge Plan: [place] Barriers to Discharge: Continued Medical Work up  Expected Discharge Plan and Services Expected Discharge Plan: [place] In-house Referral: Clinical Social Work Discharge Planning Services: CM Consult   Living arrangements for the past 2 months: Single Family Home                                       Social Determinants of Health (SDOH) Interventions    Readmission Risk Interventions No flowsheet data found.

## 2019-07-09 NOTE — Progress Notes (Signed)
Patient ID: Brenda Kerr, female   DOB: 07-26-1941, 79 y.o.   MRN: [id number] BP 128/62 (BP Location: Left Arm)   Pulse 86   Temp 98.1 F (36.7 C) (Oral)   Resp 18   Ht 5\' 2"  (1.575 m)   Wt 67.8 kg   SpO2 100%   BMI 27.34 kg/m  Awaiting rehabilitation Not ambulating stable

## 2019-07-10 DIAGNOSIS — M4800 Spinal stenosis, site unspecified: Secondary | ICD-10-CM | POA: Diagnosis not present

## 2019-07-10 DIAGNOSIS — Z20828 Contact with and (suspected) exposure to other viral communicable diseases: Secondary | ICD-10-CM | POA: Diagnosis not present

## 2019-07-10 DIAGNOSIS — R634 Abnormal weight loss: Secondary | ICD-10-CM | POA: Diagnosis not present

## 2019-07-10 DIAGNOSIS — R32 Unspecified urinary incontinence: Secondary | ICD-10-CM | POA: Diagnosis not present

## 2019-07-10 DIAGNOSIS — R627 Adult failure to thrive: Secondary | ICD-10-CM | POA: Diagnosis not present

## 2019-07-10 DIAGNOSIS — M25559 Pain in unspecified hip: Secondary | ICD-10-CM | POA: Diagnosis not present

## 2019-07-10 DIAGNOSIS — R278 Other lack of coordination: Secondary | ICD-10-CM | POA: Diagnosis not present

## 2019-07-10 DIAGNOSIS — Z9181 History of falling: Secondary | ICD-10-CM | POA: Diagnosis not present

## 2019-07-10 DIAGNOSIS — M48062 Spinal stenosis, lumbar region with neurogenic claudication: Secondary | ICD-10-CM | POA: Diagnosis not present

## 2019-07-10 DIAGNOSIS — M4804 Spinal stenosis, thoracic region: Secondary | ICD-10-CM | POA: Diagnosis not present

## 2019-07-10 DIAGNOSIS — I10 Essential (primary) hypertension: Secondary | ICD-10-CM | POA: Diagnosis not present

## 2019-07-10 DIAGNOSIS — Z993 Dependence on wheelchair: Secondary | ICD-10-CM | POA: Diagnosis not present

## 2019-07-10 DIAGNOSIS — I110 Hypertensive heart disease with heart failure: Secondary | ICD-10-CM | POA: Diagnosis not present

## 2019-07-10 DIAGNOSIS — M1712 Unilateral primary osteoarthritis, left knee: Secondary | ICD-10-CM | POA: Diagnosis not present

## 2019-07-10 DIAGNOSIS — M4802 Spinal stenosis, cervical region: Secondary | ICD-10-CM | POA: Diagnosis not present

## 2019-07-10 DIAGNOSIS — K5909 Other constipation: Secondary | ICD-10-CM | POA: Diagnosis not present

## 2019-07-10 DIAGNOSIS — N390 Urinary tract infection, site not specified: Secondary | ICD-10-CM | POA: Diagnosis not present

## 2019-07-10 DIAGNOSIS — H409 Unspecified glaucoma: Secondary | ICD-10-CM | POA: Diagnosis not present

## 2019-07-10 DIAGNOSIS — M48061 Spinal stenosis, lumbar region without neurogenic claudication: Secondary | ICD-10-CM | POA: Diagnosis not present

## 2019-07-10 DIAGNOSIS — M2550 Pain in unspecified joint: Secondary | ICD-10-CM | POA: Diagnosis not present

## 2019-07-10 DIAGNOSIS — R52 Pain, unspecified: Secondary | ICD-10-CM | POA: Diagnosis not present

## 2019-07-10 DIAGNOSIS — K2100 Gastro-esophageal reflux disease with esophagitis, without bleeding: Secondary | ICD-10-CM | POA: Diagnosis not present

## 2019-07-10 DIAGNOSIS — R2689 Other abnormalities of gait and mobility: Secondary | ICD-10-CM | POA: Diagnosis not present

## 2019-07-10 DIAGNOSIS — R41841 Cognitive communication deficit: Secondary | ICD-10-CM | POA: Diagnosis not present

## 2019-07-10 DIAGNOSIS — I5030 Unspecified diastolic (congestive) heart failure: Secondary | ICD-10-CM | POA: Diagnosis not present

## 2019-07-10 DIAGNOSIS — M160 Bilateral primary osteoarthritis of hip: Secondary | ICD-10-CM | POA: Diagnosis not present

## 2019-07-10 DIAGNOSIS — M6281 Muscle weakness (generalized): Secondary | ICD-10-CM | POA: Diagnosis not present

## 2019-07-10 DIAGNOSIS — Z79891 Long term (current) use of opiate analgesic: Secondary | ICD-10-CM | POA: Diagnosis not present

## 2019-07-10 DIAGNOSIS — D649 Anemia, unspecified: Secondary | ICD-10-CM | POA: Diagnosis not present

## 2019-07-10 DIAGNOSIS — Z7401 Bed confinement status: Secondary | ICD-10-CM | POA: Diagnosis not present

## 2019-07-10 MED ORDER — SPIRONOLACTONE 25 MG PO TABS: 25.0000 mg | ORAL_TABLET | Freq: Two times a day (BID) | ORAL | Status: DC

## 2019-07-10 MED ORDER — ADULT MULTIVITAMIN W/MINERALS CH
1.0000 | ORAL_TABLET | Freq: Every day | ORAL | Status: DC
  Administered 2019-07-10: 1 via ORAL
  Filled 2019-07-10: qty 1

## 2019-07-10 MED ORDER — CYANOCOBALAMIN 1000 MCG PO TABS: 1000.0000 ug | ORAL_TABLET | Freq: Every day | ORAL | Status: DC

## 2019-07-10 MED ORDER — ADULT MULTIVITAMIN W/MINERALS CH: 1.0000 | ORAL_TABLET | Freq: Every day | ORAL | Status: DC

## 2019-07-10 NOTE — Progress Notes (Signed)
AVS reviewed with patient and her husband. Husband was given a copy and other copy was placed in discharge packet for PTAR. All lines removed, patient dressed, and belongings packed. Attempted to call report multiple times with no success. Spoke to the [employer] who stated that all she can do is transfer the call and it's up to the nurse to pick up. Will pass on to night shift staff.

## 2019-07-10 NOTE — TOC Progression Note (Signed)
Transition of Care [facility]) - Progression Note    Patient Details  Name: Brenda Kerr MRN: [id number] Date of Birth: [birth year]  Transition of Care [facility]) CM/SW [place], [place] Phone Number: 07/10/2019, 1:51 PM  Clinical Narrative:   CSW confirmed bed availability at [facility], and that they do not need a new COVID test for patient to admit. CSW spoke with patient's daughter, [name], to discuss hopeful transfer to SNF today. [name] asked about patient's belongings, and agreeable to take them to [facility]. CSW asked about completing paperwork, and [name] agreeable to meeting at [employer] at 1:00 PM.     Expected Discharge Plan: [place] Barriers to Discharge: Continued Medical Work up  Expected Discharge Plan and Services Expected Discharge Plan: [place] In-house Referral: Clinical Social Work Discharge Planning Services: CM Consult   Living arrangements for the past 2 months: Single Family Home                                       Social Determinants of Health (SDOH) Interventions    Readmission Risk Interventions No flowsheet data found.

## 2019-07-10 NOTE — Progress Notes (Signed)
Report and discharge packet given to PTAR.

## 2019-07-10 NOTE — Discharge Instructions (Signed)
Discharge Instructions  No restriction in activities, slowly increase your activity back to normal.   Follow up with Dr. Zada Finders in 4-6 weeks after discharge. When you make your follow up appointment, please let them know that you need a new EMG prior to seeing Dr. Zada Finders.   Call your orthopedic surgeon's office after discharge to schedule a follow up appointment. Your hip surgeon has retired, but one of his partners said they would be happy to care for you going forwards.

## 2019-07-10 NOTE — TOC Transition Note (Signed)
Transition of Care [facility]) - CM/SW Discharge Note   Patient Details  Name: MYKELA KOFFORD MRN: [id number] Date of Birth: Jan 23, 1941  Transition of Care [facility]) CM/SW Contact:  Geralynn Ochs, LCSW Phone Number: 07/10/2019, 4:39 PM   Clinical Narrative:   Nurse to call report to [phone], Room 3218    Final next level of care: Skilled Nursing Facility Barriers to Discharge: Barriers Resolved   Patient Goals and CMS Choice Patient states their goals for this hospitalization and ongoing recovery are:: to get home CMS Medicare.gov Compare Post Acute Care list provided to:: Patient Represenative (must comment) Choice offered to / list presented to : Patient, Spouse, Adult Children  Discharge Placement              Patient chooses bed at: [facility] Patient to be transferred to facility by: [place] Name of family member notified: [name] Patient and family notified of of transfer: 07/10/19  Discharge Plan and Services In-house Referral: Clinical Social Work Discharge Planning Services: CM Consult                                 Social Determinants of Health (SDOH) Interventions     Readmission Risk Interventions No flowsheet data found.

## 2019-07-10 NOTE — Progress Notes (Signed)
Physical Therapy Treatment Patient Details Name: Brenda Kerr MRN: [id number] DOB: 11/17/1940 Today's Date: 07/10/2019    History of Present Illness  78 y.o. woman with DM, PVD, B THA, glaucoma, GERD, and HTN who presents with severe bilateral hip and back pain. MRI C/T/L  shows multiple areas of stenosis and severe degenerative disease. In the lumbar spine, it is worst at L3-4 and L4-5. In the thoracic spine, there is some moderate stenosis at T10-11 and T11-12. In the cervical spine, there is moderate to moderately severe stenosis at C3-4. Original concern for cauda equina but that has been ruled out.     PT Comments    Patient received in bed, husband present. Patient states " I cant do anything". Agrees to PT/OT session. Initially patient requires total assist +2 for supine to sit and to maintain sitting at edge of bed. With increased time and cues, patient eventually able to sit unsupported. Tends to fall to her left or posteriorly. Patient is very weak and will benefit from continued skilled PT to improve strength and independence with basic mobility.       qu SNF;Supervision/Assistance - 24 hour     Equipment Recommendations  None recommended by PT    Recommendations for Other Services       Precautions / Restrictions Precautions Precautions: Fall Precaution Comments: L eye blind; glaucoma in right eye Restrictions Weight Bearing Restrictions: No    Mobility  Bed Mobility Overal bed mobility: Needs Assistance Bed Mobility: Rolling;Supine to Sit;Sit to Supine Rolling: Max assist   Supine to sit: Max assist;+2 for physical assistance Sit to supine: Max assist;+2 for physical assistance   General bed mobility comments: Pt unable to grasp rail today to hold onto.  Hand over hand contact to the rail and then would be unable to maintain grasp. Initially very poor sitting balance requiring assist or would fall posteriorly. As sitting time increased, she was able to sit  unsupported with close supervision.  Transfers                 General transfer comment: unable to attempt this day  Ambulation/Gait             General Gait Details: unable   Stairs             Wheelchair Mobility    Modified Rankin (Stroke Patients Only)       Balance Overall balance assessment: Needs assistance Sitting-balance support: Feet supported;Bilateral upper extremity supported Sitting balance-Leahy Scale: Poor Sitting balance - Comments: Pt sat EOB for at least 15 minutes starting out at MAX A and progressing to MIN/guard for last 5-6 minutes with frequent cueing for positioning and for righting reactions when starting to lose balance. Postural control: Posterior lean;Left lateral lean     Standing balance comment: unable                            Cognition Arousal/Alertness: Awake/alert Behavior During Therapy: WFL for tasks assessed/performed Overall Cognitive Status: Within Functional Limits for tasks assessed                                        Exercises Other Exercises Other Exercises: while seated patient able to raise alternating arms x 2 reps and then alternating leg kicks x 5 reps    General Comments  Pertinent Vitals/Pain Pain Assessment: Faces Faces Pain Scale: Hurts little more Pain Location: left knee Pain Descriptors / Indicators: Sore;Discomfort Pain Intervention(s): Monitored during session;Limited activity within patient's tolerance;Repositioned    Home Living                      Prior Function            PT Goals (current goals can now be found in the care plan section) Acute Rehab PT Goals Patient Stated Goal: to get stronger PT Goal Formulation: With patient Time For Goal Achievement: 07/17/19 Potential to Achieve Goals: Fair Progress towards PT goals: Progressing toward goals    Frequency    Min 2X/week      PT Plan Current plan remains  appropriate    Co-evaluation PT/OT/SLP Co-Evaluation/Treatment: Yes Reason for Co-Treatment: For patient/therapist safety;To address functional/ADL transfers PT goals addressed during session: Mobility/safety with mobility;Balance;Strengthening/ROM        AM-PAC PT "6 Clicks" Mobility   Outcome Measure  Help needed turning from your back to your side while in a flat bed without using bedrails?: Total Help needed moving from lying on your back to sitting on the side of a flat bed without using bedrails?: Total Help needed moving to and from a bed to a chair (including a wheelchair)?: Total Help needed standing up from a chair using your arms (e.g., wheelchair or bedside chair)?: Total Help needed to walk in hospital room?: Total Help needed climbing 3-5 steps with a railing? : Total 6 Click Score: 6    End of Session   Activity Tolerance: Patient tolerated treatment well;Patient limited by fatigue Patient left: in bed;with bed alarm set;with call bell/phone within reach;with family/visitor present Nurse Communication: Mobility status PT Visit Diagnosis: Muscle weakness (generalized) (M62.81);Other abnormalities of gait and mobility (R26.89) Pain - Right/Left: Left Pain - part of body: Knee     Time: 1141-1210 PT Time Calculation (min) (ACUTE ONLY): 29 min  Charges:  $Therapeutic Activity: 8-22 mins                     Barby Colvard, PT, GCS 07/10/19,12:34 PM

## 2019-07-10 NOTE — Progress Notes (Signed)
Nutrition Follow-up  DOCUMENTATION CODES:   Not applicable  INTERVENTION:  - continue Ensure Enlive BID. - will order daily multivitamin with minerals. - continue to encourage PO intakes.  - weigh patient today.    NUTRITION DIAGNOSIS:   Increased nutrient needs related to acute illness as evidenced by estimated needs. -ongoing  GOAL:   Patient will meet greater than or equal to 90% of their needs -beginning to meet  MONITOR:   PO intake, Supplement acceptance, Weight trends, Labs, Skin  REASON FOR ASSESSMENT:   Consult Assessment of nutrition requirement/status  ASSESSMENT:   78 year old female with past medical history significant of T2DM, GERD, HTN, PVD, arthritis, dysrhythmia severe spinal stenosis, NSAID induced esophagitis who presented with bilateral hip and back pain and urinary incontinence.  She has not been weighed since admission (10/30). Per review of order, patient has been accepting all bottles of Ensure since 11/5. Per flow sheet documentation, she most recently consumed:  11/6- 75% of breakfast, 70% of lunch, 25% of dinner 11/7- 80% of lunch 11/8- 80% of breakfast, 60% of lunch, 100% of dinner 11/9- 60% of breakfast  Patient reports good appetite the past week and no difficulties or discomforts with PO intakes.    Labs reviewed; no BMP since 11/3, last CBG was at 1216 on 11/7 (106 mg/dl). Medications reviewed; 100 mg colace BID, 25 mg aldactone BID, 1000 mcg oral cyanocobalamin/day.      NUTRITION - FOCUSED PHYSICAL EXAM:  completed; no muscle and no fat wasting.   Diet Order:   Diet Order            Diet regular Room service appropriate? Yes; Fluid consistency: Thin  Diet effective now              EDUCATION NEEDS:   No education needs have been identified at this time  Skin:  Skin Assessment: Skin Integrity Issues: Skin Integrity Issues:: Stage I Stage I: L buttocks; R thigh  Last BM:  11/8  Height:   Ht Readings from Last  1 Encounters:  06/30/19 5\' 2"  (1.575 m)    Weight:   Wt Readings from Last 1 Encounters:  06/30/19 67.8 kg    Ideal Body Weight:  50 kg  BMI:  Body mass index is 27.34 kg/m.  Estimated Nutritional Needs:   Kcal:  1600-1800  Protein:  80-90  Fluid:  >/= 1.6 L/day      Jarome Matin, MS, RD, LDN, [facility] Inpatient Clinical Dietitian Pager # [phone] After hours/weekend pager # [phone]

## 2019-07-10 NOTE — Progress Notes (Addendum)
Neurosurgery Service Progress Note  Subjective: No acute events overnight, no new complaints  Objective: Vitals:   07/09/19 1645 07/09/19 1951 07/09/19 2353 07/10/19 0350  BP: 136/76 (!) 135/50 (!) 130/58 (!) 141/59  Pulse: 72 84 73 66  Resp: '16 18 17 17  ' Temp: 98.9 F (37.2 C) 98.9 F (37.2 C) 98.4 F (36.9 C) 98 F (36.7 C)  TempSrc: Oral Oral Oral Oral  SpO2: 100% 99% 100% 98%  Weight:      Height:       Temp (24hrs), Avg:98.4 F (36.9 C), Min:98 F (36.7 C), Max:98.9 F (37.2 C)  CBC Latest Ref Rng & Units 07/04/2019 06/30/2019 05/23/2019  WBC 4.0 - 10.5 K/uL 9.9 7.2 7.9  Hemoglobin 12.0 - 15.0 g/dL 12.9 10.5(L) 12.9  Hematocrit 36.0 - 46.0 % 40.5 34.1(L) 40.9  Platelets 150 - 400 K/uL 288 272 256   BMP Latest Ref Rng & Units 07/04/2019 06/30/2019 05/23/2019  Glucose 70 - 99 mg/dL 107(H) 81 99  BUN 8 - 23 mg/dL 24(H) 19 20  Creatinine 0.44 - 1.00 mg/dL 0.87 0.72 0.94  Sodium 135 - 145 mmol/L 136 138 140  Potassium 3.5 - 5.1 mmol/L 4.4 3.3(L) 4.1  Chloride 98 - 111 mmol/L 105 112(H) 107  CO2 22 - 32 mmol/L 24 20(L) 23  Calcium 8.9 - 10.3 mg/dL 9.5 7.3(L) 9.9    Intake/Output Summary (Last 24 hours) at 07/10/2019 0713 Last data filed at 07/10/2019 0500 Gross per 24 hour  Intake 900 ml  Output 1200 ml  Net -300 ml    Current Facility-Administered Medications:  .  0.9 %  sodium chloride infusion, 250 mL, Intravenous, Continuous, Myleigh Amara A, MD .  acetaminophen (TYLENOL) tablet 650 mg, 650 mg, Oral, Q4H PRN, 650 mg at 07/08/19 1633 **OR** acetaminophen (TYLENOL) suppository 650 mg, 650 mg, Rectal, Q4H PRN, Judith Part, MD .  bisacodyl (DULCOLAX) EC tablet 5-10 mg, 5-10 mg, Oral, Daily PRN, Judith Part, MD, 5 mg at 07/08/19 1638 .  brimonidine (ALPHAGAN) 0.2 % ophthalmic solution 1 drop, 1 drop, Both Eyes, Q12H, 1 drop at 07/09/19 2105 **AND** timolol (TIMOPTIC) 0.5 % ophthalmic solution 1 drop, 1 drop, Both Eyes, Q12H, Deakin Lacek, Joyice Faster, MD,  1 drop at 07/09/19 2105 .  cyclobenzaprine (FLEXERIL) tablet 10 mg, 10 mg, Oral, TID PRN, Judith Part, MD, 10 mg at 07/09/19 1006 .  diclofenac sodium (VOLTAREN) 1 % transdermal gel 2 g, 2 g, Topical, QID PRN, Judith Part, MD .  docusate sodium (COLACE) capsule 100 mg, 100 mg, Oral, BID, Tove Wideman A, MD, 100 mg at 07/09/19 2105 .  dorzolamide (TRUSOPT) 2 % ophthalmic solution 1 drop, 1 drop, Both Eyes, BID, Dustine Stickler, Joyice Faster, MD, 1 drop at 07/09/19 2105 .  feeding supplement (ENSURE ENLIVE) (ENSURE ENLIVE) liquid 237 mL, 237 mL, Oral, BID BM, Coree Brame A, MD, 237 mL at 07/09/19 1516 .  heparin injection 5,000 Units, 5,000 Units, Subcutaneous, Q8H, Judith Part, MD, 5,000 Units at 07/10/19 0514 .  HYDROmorphone (DILAUDID) injection 0.5 mg, 0.5 mg, Intravenous, Q3H PRN, Judith Part, MD, 0.5 mg at 07/09/19 1525 .  labetalol (NORMODYNE) tablet 100 mg, 100 mg, Oral, BID, Judith Part, MD, 100 mg at 07/09/19 2105 .  latanoprost (XALATAN) 0.005 % ophthalmic solution 1 drop, 1 drop, Both Eyes, QHS, Julianny Milstein A, MD, 1 drop at 07/09/19 2105 .  menthol-cetylpyridinium (CEPACOL) lozenge 3 mg, 1 lozenge, Oral, PRN **OR** phenol (CHLORASEPTIC) mouth spray 1  spray, 1 spray, Mouth/Throat, PRN, Judith Part, MD .  ondansetron (ZOFRAN) tablet 4 mg, 4 mg, Oral, Q6H PRN, 4 mg at 07/05/19 1013 **OR** ondansetron (ZOFRAN) injection 4 mg, 4 mg, Intravenous, Q6H PRN, Judith Part, MD, 4 mg at 07/08/19 1722 .  oxyCODONE (Oxy IR/ROXICODONE) immediate release tablet 10 mg, 10 mg, Oral, Q4H PRN, Judith Part, MD, 10 mg at 07/09/19 1231 .  oxyCODONE (Oxy IR/ROXICODONE) immediate release tablet 5 mg, 5 mg, Oral, Q4H PRN, Judith Part, MD, 5 mg at 07/08/19 1633 .  pantoprazole (PROTONIX) EC tablet 40 mg, 40 mg, Oral, Daily, Pahwani, Rinka R, MD, 40 mg at 07/09/19 0819 .  polyethylene glycol (MIRALAX / GLYCOLAX) packet 17 g, 17 g, Oral, Daily  PRN, Judith Part, MD, 17 g at 07/08/19 1633 .  sodium chloride flush (NS) 0.9 % injection 3 mL, 3 mL, Intravenous, Q12H, Almedia Cordell, Joyice Faster, MD, 3 mL at 07/09/19 2106 .  sodium chloride flush (NS) 0.9 % injection 3 mL, 3 mL, Intravenous, PRN, Judith Part, MD .  spironolactone (ALDACTONE) tablet 25 mg, 25 mg, Oral, BID, Jun Osment, Joyice Faster, MD, 25 mg at 07/09/19 1643 .  vitamin B-12 (CYANOCOBALAMIN) tablet 1,000 mcg, 1,000 mcg, Oral, Daily, Dhungel, Nishant, MD, 1,000 mcg at 07/09/19 3500   Physical Exam: AOx1, PERRL, EOMI, FS, Strength 5/5 pain-limited x4   Assessment & Plan: 78 y.o. woman transferred for concern for cauda equina syndrome, no complaints c/w or exam evidence of cauda equina. Main complaint is foot numbness and b/l hip pain.  Neuro / Hand/foot numbness: -has multiple areas of spinal stenosis - cervical / thoracic / lumbar, but atypical exam for any spinal pathology, will need EMG/NCV as outpatient to determine if peripheral or spinal  Medicine rec: likely osteo, normal ESR/CRP/urate, repleted hypokalemia, no other active issues  Ortho: rec'd b/l MARS MRI hips, which showed R sided particle dz, will need outpatient follow up -pain improved w/ steroids + NSAIDs, cont home PPI, NSAIDs x3d  Dispo/PPx: -PT/OT rec SNF, discharge to SNF today -SQH for DVT PPx  Judith Part  07/10/19 7:13 AM

## 2019-07-10 NOTE — Progress Notes (Signed)
Occupational Therapy Treatment Patient Details Name: Brenda Kerr MRN: [id number] DOB: 23-Feb-1941 Today's Date: 07/10/2019    History of present illness  78 y.o. woman with DM, PVD, B THA, glaucoma, GERD, and HTN who presents with severe bilateral hip and back pain. MRI C/T/L  shows multiple areas of stenosis and severe degenerative disease. In the lumbar spine, it is worst at L3-4 and L4-5. In the thoracic spine, there is some moderate stenosis at T10-11 and T11-12. In the cervical spine, there is moderate to moderately severe stenosis at C3-4. Original concern for cauda equina but that has been ruled out.    OT comments  Pt making steady progress towards OT goals this session. Session focus on sitting balance and bed mobility as precursor to higher level ADLs. Pt required MAX A +2  for bed mobility to transition from supine >EOB. Pt required hand over hand assist to locate bed rail, but unable to maintain grasp to assist with rolling. Pt sat EOB for ~ 15 minutes with initial MOD A progressing to close min guard. Pt required total A to assist with anterior pericare after incontinent episode. DC plan remains appropriate, will continue to follow acutely per POC.    Follow Up Recommendations  SNF    Equipment Recommendations  3 in 1 bedside commode    Recommendations for Other Services      Precautions / Restrictions Precautions Precautions: Fall Precaution Comments: L eye blind; glaucoma in right eye Restrictions Weight Bearing Restrictions: No       Mobility Bed Mobility Overal bed mobility: Needs Assistance Bed Mobility: Rolling;Supine to Sit;Sit to Supine Rolling: Max assist   Supine to sit: Max assist;+2 for physical assistance Sit to supine: Max assist;+2 for physical assistance   General bed mobility comments: Pt unable to grasp rail today to hold onto.  Hand over hand contact to the rail and then would be unable to maintain grasp. Initially very poor sitting balance  requiring assist or would fall posteriorly. As sitting time increased, she was able to sit unsupported with close supervision.  Transfers                 General transfer comment: unable to attempt this day    Balance Overall balance assessment: Needs assistance Sitting-balance support: Feet supported;Bilateral upper extremity supported Sitting balance-Leahy Scale: Poor Sitting balance - Comments: Pt sat EOB for at least 15 minutes starting out at MAX A and progressing to MIN/guard for last 5-6 minutes with frequent cueing for positioning and for righting reactions when starting to lose balance. Postural control: Posterior lean;Left lateral lean     Standing balance comment: unable                           ADL either performed or assessed with clinical judgement   ADL Overall ADL's : Needs assistance/impaired Eating/Feeding: Maximal assistance           Lower Body Bathing: Total assistance;Bed level Lower Body Bathing Details (indicate cue type and reason): total A for LB bathing d/t incontinent episode Upper Body Dressing : Total assistance;Bed level         Toilet Transfer Details (indicate cue type and reason): unable to transfer this session Toileting- Clothing Manipulation and Hygiene: Total assistance;Bed level       Functional mobility during ADLs: Moderate assistance;Min guard;+2 for physical assistance;+2 for safety/equipment(bed mobility only) General ADL Comments: MAX- total A for bed mobility, MOD- MIN guard  for sitting balance, total A for all ADLs, limited by generalized weakness and decreased activity tolerance     Vision Baseline Vision/History: Glaucoma;Legally blind Patient Visual Report: No change from baseline Additional Comments: legally blind in left eye, glaucoma in right eye; significantly impacts pt independence and safety with ADL and functional mobility   Perception     Praxis      Cognition Arousal/Alertness:  Awake/alert Behavior During Therapy: WFL for tasks assessed/performed Overall Cognitive Status: Within Functional Limits for tasks assessed                                          Exercises Other Exercises Other Exercises: while seated patient able to raise alternating arms x 2 reps and then alternating leg kicks x 5 reps   Shoulder Instructions       General Comments      Pertinent Vitals/ Pain       Pain Assessment: Faces Faces Pain Scale: Hurts little more Pain Location: left knee Pain Descriptors / Indicators: Sore;Discomfort Pain Intervention(s): Monitored during session;Limited activity within patient's tolerance;Repositioned  Home Living                                          Prior Functioning/Environment              Frequency  Min 2X/week        Progress Toward Goals  OT Goals(current goals can now be found in the care plan section)  Progress towards OT goals: Progressing toward goals  Acute Rehab OT Goals Patient Stated Goal: to get stronger OT Goal Formulation: With patient Time For Goal Achievement: 07/18/19 Potential to Achieve Goals: Good  Plan Discharge plan remains appropriate    Co-evaluation    PT/OT/SLP Co-Evaluation/Treatment: Yes Reason for Co-Treatment: Complexity of the patient's impairments (multi-system involvement);For patient/therapist safety;To address functional/ADL transfers PT goals addressed during session: Mobility/safety with mobility;Balance;Strengthening/ROM OT goals addressed during session: ADL's and self-care      AM-PAC OT "6 Clicks" Daily Activity     Outcome Measure   Help from another person eating meals?: Total Help from another person taking care of personal grooming?: Total Help from another person toileting, which includes using toliet, bedpan, or urinal?: Total Help from another person bathing (including washing, rinsing, drying)?: Total Help from another person to  put on and taking off regular upper body clothing?: Total Help from another person to put on and taking off regular lower body clothing?: Total 6 Click Score: 6    End of Session    OT Visit Diagnosis: Unsteadiness on feet (R26.81);Other abnormalities of gait and mobility (R26.89);Muscle weakness (generalized) (M62.81);Low vision, both eyes (H54.2);Other symptoms and signs involving cognitive function;Feeding difficulties (R63.3);Pain Pain - part of body: Leg   Activity Tolerance Patient tolerated treatment well   Patient Left in bed;with call bell/phone within reach;with family/visitor present   Nurse Communication          Time: [id number] OT Time Calculation (min): 29 min  Charges: OT General Charges $OT Visit: 1 Visit OT Treatments $Self Care/Home Management : 8-22 mins  Lanier Clam., COTA/L Acute Rehabilitation Services [phone] [place] 07/10/2019, 1:59 PM

## 2019-07-10 NOTE — Discharge Summary (Addendum)
Discharge Summary  Date of Admission: 06/30/2019  Date of Discharge: 07/10/19  Attending Physician: Emelda Brothers, MD  Hospital Course: Patient was admitted after presenting to the ED with pain-limited ambulation. There was concern for cauda equina syndrome, so she was transferred to my service at [facility]. Upon further evaluation, he pain was hip pain and she had no evidence of cauda equina syndrome. Ortho was consulted, who recommended repeat MRI b/l hips with outpatient follow up. The MRI was obtained and it showed unilateral particulate disease. She was seen by PT/OT, who recommended SNF placement and she was discharged on 07/10/19. Medicine was consulted, who increased her spironolactone to bid and added a multivitamin for possible B12 deficiency. She will follow up with neurosurgery and orthopedics after discharge.  Neurologic exam at discharge:  AOx3, PERRL, EOMI, FS, TM Strength pain limited at the hips, otherwise diffusely 4+/5 x4, SILTx4 except stocking/glove numbness  Discharge diagnosis: Hip arthralgia, failure to thrive   Allergies as of 07/10/2019      Reactions   Codeine Nausea And Vomiting      Medication List    STOP taking these medications   diazepam 5 MG tablet Commonly known as: VALIUM   HYDROcodone-acetaminophen 5-325 MG tablet Commonly known as: NORCO/VICODIN   ibuprofen 200 MG tablet Commonly known as: ADVIL   meclizine 12.5 MG tablet Commonly known as: ANTIVERT   oxyCODONE-acetaminophen 5-325 MG tablet Commonly known as: PERCOCET/ROXICET   predniSONE 10 MG (21) Tbpk tablet Commonly known as: STERAPRED UNI-PAK 21 TAB   tiZANidine 2 MG tablet Commonly known as: ZANAFLEX     TAKE these medications   acetaminophen 500 MG tablet Commonly known as: TYLENOL Take 500 mg by mouth every 6 (six) hours as needed for mild pain.   bimatoprost 0.01 % Soln Commonly known as: LUMIGAN Place 1 drop into both eyes at bedtime.   brimonidine-timolol 0.2-0.5 %  ophthalmic solution Commonly known as: COMBIGAN Place 1 drop into both eyes every 12 (twelve) hours.   cyanocobalamin 1000 MCG tablet Take 1 tablet (1,000 mcg total) by mouth daily. Start taking on: July 11, 2019   diclofenac sodium 1 % Gel Commonly known as: VOLTAREN Apply 2 g topically 4 (four) times daily as needed for pain.   dorzolamide 2 % ophthalmic solution Commonly known as: TRUSOPT Place 1 drop into both eyes 2 (two) times daily.   labetalol 100 MG tablet Commonly known as: NORMODYNE Take 100 mg by mouth 2 (two) times daily.   multivitamin with minerals Tabs tablet Take 1 tablet by mouth daily.   naproxen sodium 220 MG tablet Commonly known as: ALEVE Take 220 mg by mouth 2 (two) times daily as needed (pain).   ondansetron 4 MG disintegrating tablet Commonly known as: Zofran ODT Take 1 tablet (4 mg total) by mouth every 8 (eight) hours as needed.   spironolactone 25 MG tablet Commonly known as: ALDACTONE Take 25 mg by mouth 2 (two) times daily. What changed: Another medication with the same name was added. Make sure you understand how and when to take each.   spironolactone 25 MG tablet Commonly known as: ALDACTONE Take 1 tablet (25 mg total) by mouth 2 (two) times daily. What changed: You were already taking a medication with the same name, and this prescription was added. Make sure you understand how and when to take each.       Judith Part, MD 07/10/19 4:09 PM

## 2019-07-11 DIAGNOSIS — M48061 Spinal stenosis, lumbar region without neurogenic claudication: Secondary | ICD-10-CM | POA: Diagnosis not present

## 2019-07-11 DIAGNOSIS — K2100 Gastro-esophageal reflux disease with esophagitis, without bleeding: Secondary | ICD-10-CM | POA: Diagnosis not present

## 2019-07-11 DIAGNOSIS — I10 Essential (primary) hypertension: Secondary | ICD-10-CM | POA: Diagnosis not present

## 2019-07-11 DIAGNOSIS — R627 Adult failure to thrive: Secondary | ICD-10-CM | POA: Diagnosis not present

## 2019-07-12 ENCOUNTER — Other Ambulatory Visit: Payer: Self-pay | Admitting: *Deleted

## 2019-07-12 DIAGNOSIS — M25559 Pain in unspecified hip: Secondary | ICD-10-CM | POA: Diagnosis not present

## 2019-07-12 DIAGNOSIS — M4800 Spinal stenosis, site unspecified: Secondary | ICD-10-CM | POA: Diagnosis not present

## 2019-07-12 NOTE — Patient Outreach (Signed)
Member assessed for potential [facility] Care Management needs as a benefit of  [place] Medicare.  Member is currently receiving rehab therapy at [facility] SNF.  Member discussed in weekly telephonic IDT meeting with facility staff, [facility] UM team, and writer.  Facility reports member lived with husband prior to admission.  Writer will continue to follow for disposition plans, progression, and for potential [facility] Care Management needs.    Marthenia Rolling, MSN-Ed, RN,BSN [place] Acute Care Coordinator [phone] [facility]) [phone]  (Toll free office)

## 2019-07-18 DIAGNOSIS — M48061 Spinal stenosis, lumbar region without neurogenic claudication: Secondary | ICD-10-CM | POA: Diagnosis not present

## 2019-07-18 DIAGNOSIS — I10 Essential (primary) hypertension: Secondary | ICD-10-CM | POA: Diagnosis not present

## 2019-07-18 DIAGNOSIS — R627 Adult failure to thrive: Secondary | ICD-10-CM | POA: Diagnosis not present

## 2019-07-18 DIAGNOSIS — K2100 Gastro-esophageal reflux disease with esophagitis, without bleeding: Secondary | ICD-10-CM | POA: Diagnosis not present

## 2019-07-21 DIAGNOSIS — K2100 Gastro-esophageal reflux disease with esophagitis, without bleeding: Secondary | ICD-10-CM | POA: Diagnosis not present

## 2019-07-21 DIAGNOSIS — M48061 Spinal stenosis, lumbar region without neurogenic claudication: Secondary | ICD-10-CM | POA: Diagnosis not present

## 2019-07-21 DIAGNOSIS — N390 Urinary tract infection, site not specified: Secondary | ICD-10-CM | POA: Diagnosis not present

## 2019-07-21 DIAGNOSIS — K5909 Other constipation: Secondary | ICD-10-CM | POA: Diagnosis not present

## 2019-07-23 DIAGNOSIS — K5909 Other constipation: Secondary | ICD-10-CM | POA: Diagnosis not present

## 2019-07-23 DIAGNOSIS — N390 Urinary tract infection, site not specified: Secondary | ICD-10-CM | POA: Diagnosis not present

## 2019-07-23 DIAGNOSIS — M4804 Spinal stenosis, thoracic region: Secondary | ICD-10-CM | POA: Diagnosis not present

## 2019-07-23 DIAGNOSIS — I10 Essential (primary) hypertension: Secondary | ICD-10-CM | POA: Diagnosis not present

## 2019-07-23 DIAGNOSIS — M4802 Spinal stenosis, cervical region: Secondary | ICD-10-CM | POA: Diagnosis not present

## 2019-07-23 DIAGNOSIS — M48061 Spinal stenosis, lumbar region without neurogenic claudication: Secondary | ICD-10-CM | POA: Diagnosis not present

## 2019-07-23 DIAGNOSIS — R627 Adult failure to thrive: Secondary | ICD-10-CM | POA: Diagnosis not present

## 2019-07-24 ENCOUNTER — Other Ambulatory Visit: Payer: Self-pay | Admitting: *Deleted

## 2019-07-24 DIAGNOSIS — M48061 Spinal stenosis, lumbar region without neurogenic claudication: Secondary | ICD-10-CM | POA: Diagnosis not present

## 2019-07-24 DIAGNOSIS — K2100 Gastro-esophageal reflux disease with esophagitis, without bleeding: Secondary | ICD-10-CM | POA: Diagnosis not present

## 2019-07-24 DIAGNOSIS — I10 Essential (primary) hypertension: Secondary | ICD-10-CM | POA: Diagnosis not present

## 2019-07-24 DIAGNOSIS — N390 Urinary tract infection, site not specified: Secondary | ICD-10-CM | POA: Diagnosis not present

## 2019-07-24 NOTE — Patient Outreach (Signed)
Member assessed for potential [facility] Care Management needs as a benefit of  [place] Medicare.  Member is currently receiving skilled therapy at [facility].  Update received from [place]. Care plan meeting was done with daughter [name]. Disposition plan per facility dc planner is for member to return home with one of her daughters and 24 hr care.  Will plan outreach for potential [place] Management services. Will also need [name]'s contact information.   Marthenia Rolling, MSN-Ed, RN,BSN [place] Acute Care Coordinator [phone] [facility] [phone]  (Toll free office)

## 2019-07-25 ENCOUNTER — Other Ambulatory Visit: Payer: Self-pay | Admitting: *Deleted

## 2019-07-25 NOTE — Patient Outreach (Signed)
Member assessed for potential [facility] Care Management needs as a benefit of [place] Medicare.  Brenda Kerr is currently at [facility] SNF. Facility [employer] planner indicated member will be discharging home with one of her daughters and will have 59 hour care. Facility reports member's daughter Brenda Kerr is primary contact at [phone] to call for [place] Management discussion.  Telephone call made to Brenda Kerr at [phone].  Patient identifiers confirmed. Brenda Kerr reports member will discharge from [facility] SNF later on tomorrow.   Brenda Kerr endorses that Brenda Kerr will discharge home with one of her other daughters. She will have 24hr care.   Brenda Kerr is agreeable to [place] Management follow up. She reports she is the main contact person and should be called post SNF dc at [phone]. Brenda Kerr has cognitive deficit.   Will plan to make [place] Management referral. Brenda Kerr has a history of DM, GERD, PVD,GERD.   Marthenia Rolling, MSN-Ed, RN,BSN [place] Acute Care Coordinator [phone] [facility]) [phone]  (Toll free office)

## 2019-07-26 ENCOUNTER — Other Ambulatory Visit: Payer: Self-pay

## 2019-07-31 ENCOUNTER — Other Ambulatory Visit: Payer: Self-pay | Admitting: *Deleted

## 2019-07-31 DIAGNOSIS — I10 Essential (primary) hypertension: Secondary | ICD-10-CM

## 2019-07-31 NOTE — Patient Outreach (Addendum)
Member assessed for potential [facility] Care Management needs as a benefit of Brenda Kerr.  Verified in Patient Brenda Kerr that Brenda Kerr discharged from [facility] Kerr SNF on 07/26/19. She had home health services arranged. Note sent to facility dc planner to confirm agency.  Writer previously spoke with member's daughter Brenda Kerr at [phone] about [place] Management follow up. Brenda Kerr should be contacted for post SNF discharge calls. Please see writer's notes from 07/25/19 for additional details.   Will make referral for [facility] Care Management RNCM. [name] has a medical history of DM, PVD, GERD, HTN, cognitive deficit.   Addendum: [facility] Kerr confirms member will have Brenda Kerr home health. She was active with Brenda Kerr prior.  Brenda Rolling, MSN-Ed, RN,BSN [place] Acute Care Coordinator [phone] [facility]) [phone]  (Toll free office)

## 2019-08-01 ENCOUNTER — Other Ambulatory Visit: Payer: Self-pay | Admitting: *Deleted

## 2019-08-01 NOTE — Patient Outreach (Signed)
[facility]) Care Management  08/01/2019  Brenda Kerr June 12, 1941 [id number]   Opened in error  [name] L. Lavina Hamman, RN, BSN, [place] Coordinator Office number [phone] Mobile number [phone]  Main THN number [phone] Fax number [phone]

## 2019-08-02 ENCOUNTER — Encounter: Payer: Self-pay | Admitting: *Deleted

## 2019-08-02 ENCOUNTER — Other Ambulatory Visit: Payer: Self-pay | Admitting: *Deleted

## 2019-08-02 NOTE — Patient Outreach (Signed)
[facility] [facility]) Care Management  08/02/2019  NEIDRA BROWER [birth year] [id number]   Transition of Care Referral   Referral Date: 11/30 Referral Source: Marthenia Rolling [name] RN CM Post Acute Care Coordinator Date of Admission: 07/10/19 Diagnosis: Hip arthralgia, failure to thrive, DM, HTN, GERD, PVD Date of Discharge: 07[facility]: [place]: [place] Last hospital admission 06/30/19 to 07/10/19  Outreach attempt # 1 successful to daughter as directed in [facility] referral  Patient's daughter [name], is able to verify HIPAA, DOB and address Reviewed and addressed Transitional of care referral with patients daughter Butler Denmark reports Mrs Schwarting did d/c to her home after 07/26/19 but is now at [name]'s sister home for 24 hour supervision [name] was allowed to ventilate her feelings about the care Mrs Rauth may not have received at the snf as evidence by worsen weakness, dehydration and oral care  Social: Mrs RALYN BODLE is a 78 year old patient who retired from [employer] and lived with husband, [name] prior to admission on 06/30/19 but is now residing with one of her daughters, Monae Mcteague and alternating stays with one of her other daughters so that patient can receive 24 hour supervision. She is reported to be assist/dependent in her care needs related to all her extremity weaknesses. She needs 2+ assist to get her up to standing and in bathroom.  Prior to her hospital admission, [name] reports Mrs Zyla was able to move around and stand up with minimal assist and sit up straight but since the hospitalization and 20 day snf rehab, Mrs Raysor is 2+ dependent. She has to be lifted or picked up per [facility] home health services have started and she has been seen by the [facility] PT on 07/28/19. [name] reports the [facility] PT noted the decrease in Mrs Soder strength to complete ADLs. [name] Daughter reports that with the 20 day snf rehab Mrs Cromie  would return home more stronger.  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 [name] reports the family is trying to assist Mrs Krysiak with this by offering sleepy time tea to help her rest more at night. 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, cauda equina compression-Patient was evaluated by neurosurgery-no surgical intervention required., venous stasis ulcers of both legs,back, feet, leg and  bilateral hip pain, right knee pain and swelling, anemia, falls, h/o hiatal hernia, sob with walking a long way, dysrhythmia, FTT glaucoma, partially blind ([name] reports pt can see shadows far away but not up close- had 2 laser surgeries- receiving eye gtts She has not had glasses in a while)  Family hx of blindness and alzheimer per [name]    DME Rolling walker,  dentures  Medications: She .denies concerns with taking medications as prescribed, affording medications, side effects of medications and questions about medications Pt was given eye gtts both after d/c from hospital and from snf   Appointments: Dr Criss Rosales primary care provider seen per daughter prior to 06/30/19 hospital admission   Advance directives: Denies need for assist with advance directives   Consent: [facility] RN CM reviewed [facility] services with patient. Patient gave verbal consent for services. Advised patient that other post discharge calls may occur to assess how the patient is doing following the recent hospitalization. Patient voiced understanding and was appreciative of f/u call.  Plan: [facility] RN CM will follow up with Mrs Calzadillas and/or her daughter [name] in 7-14 business days for further assessment and interventions  Pt encouraged to return a call to [facility] RN CM prn  [facility] RN CM sent a successful outreach letter as discussed with [facility] brochure enclosed for review to [name]'s address as requested  Routed note to MDs/NP/PA  [facility] CM Care Plan Problem One     Most Recent Value  Care Plan Problem One  knowledge deficit of home care for Hip arthralgia, failure to thrive, DM & HTN  Role Documenting the Problem One  Care Management Telephonic Coordinator  Care Plan for Problem One  Active  THN Long Term Goal   over the next 45 days patient and family will be able to verbalize improvement in home care needs for Hip arthralgia, failure to thrive, DM and HTN  during follow up calls  [facility] Long Term Goal Start Date  08/02/19  Interventions for Problem One Long Term Goal  Transition of care assessment, allowed daughter time to ventilate her feeling, offered support  THN CM Short Term Goal #1   over the next 14 days patient will have DME to assist with incontinence as verbalized during follow up calls  [facility] CM Short Term Goal #1 Start Date  08/02/19  Interventions for Short Term Goal #1  Assess bowel and  bladder elimination  THN CM Short Term Goal #2   over the next 14 days pt will have increase mobility as verbalized during follow up calls  [facility] CM Short Term Goal #2 Start Date  08/02/19  Interventions for Short Term Goal #2  assessed mobility, assessed for home health services involvement, assess for ADL skills  THN CM Short Term Goal #3  over the next 14 days patietn will have follow up visit with primary care MD (virtually, telephonically or office visit) as verbalized during follow up call  [facility] CM Short Term Goal #3 Start Date  08/02/19  Interventions for Short Tern Goal #3  Asses for primary care provider follow up, discussed options of receiving follow up, discussed the importance of follow up care      [place]. Lavina Hamman, RN, BSN, [place] Management Care Coordinator Direct Number [phone] Mobile number [phone]  Main THN number [phone] Fax number [phone]

## 2019-08-04 ENCOUNTER — Other Ambulatory Visit: Payer: Self-pay | Admitting: *Deleted

## 2019-08-04 DIAGNOSIS — M130 Polyarthritis, unspecified: Secondary | ICD-10-CM | POA: Diagnosis not present

## 2019-08-04 DIAGNOSIS — R4182 Altered mental status, unspecified: Secondary | ICD-10-CM | POA: Diagnosis not present

## 2019-08-04 DIAGNOSIS — F064 Anxiety disorder due to known physiological condition: Secondary | ICD-10-CM | POA: Diagnosis not present

## 2019-08-04 NOTE — Patient Outreach (Signed)
[facility]) Care Management  08/04/2019  AHUVA MISCHKE 22-Oct-1940 [id number]   Care coordination  [facility] RN CM spoke with [facility] RN CM, A [place] on 08/03/19 about daughter's concerns with snf care Discussed having daughter to cal the snf [phone])administrator and if no response the state   [facility] RN CM called Dr Criss Rosales office [phone] and spoke with Jeani Hawking about Mrs Shenan Cancilla confirms the office is aware of the need for a pure wick for Mrs Delcourt and is assisting her in getting one Jeani Hawking requested [facility] RN CM leave a voice message for the office RN about DM monitoring  Mrs Cedano's ht and wt, and recent flu shot Last one listed was in 1   [facility] RN CM called to [facility] to attempt to speak with or leave a message for her home health PT staff  Laqueta Linden was unable to find that Mrs Befort was active with [facility] at this time Transferred to home health department in [place] and spoke with Thayer Headings and Nira Conn to confirm pt is not active with [facility] at this time  Plan: [facility] RN CM will follow up with Mrs Huie and/or her daughter [name] in 7-14 business days for further assessment and interventions  Eliakim Tendler L. Lavina Hamman, RN, BSN, [place] Coordinator Office number [phone] Mobile number [phone]  Main THN number [phone] Fax number [phone]

## 2019-08-07 ENCOUNTER — Other Ambulatory Visit: Payer: Self-pay | Admitting: *Deleted

## 2019-08-07 NOTE — Patient Outreach (Signed)
  [place] [facility]) Care Management  08/07/2019  MILLISSA MURNANE Mar 03, 1941 [id number]   Care coordination   THN RN CM called [facility] office [phone] [place] CM was transferred to the [facility] PT staff working with Mrs [facility] RM CM left a voice message requesting a return call to discuss the mobility baseline and tx for Mrs Royston  [facility] RN CM left her return office and mobile numbers   Plan: [facility] RN CM willfollow up with Mrs Holderby and/or her daughter [name] in 7-14 business days for further assessment and interventions   Johnay Mano L. Lavina Hamman, RN, BSN, [place] Coordinator Office number [phone] Mobile number [phone]  Main THN number [phone] Fax number [phone]

## 2019-08-09 ENCOUNTER — Other Ambulatory Visit: Payer: Self-pay | Admitting: *Deleted

## 2019-08-09 ENCOUNTER — Other Ambulatory Visit: Payer: Self-pay

## 2019-08-09 NOTE — Patient Outreach (Signed)
[facility]) Care Management  08/09/2019  Brenda Kerr 06-Jun-1941 [id number]   Week 2 follow up for Transition of Care Referral  Referral Date: 11/30 Referral Source: Brenda Kerr [facility] RN [place] Post Acute Care Coordinator Date of Admission: 07/10/19 Diagnosis: Hip arthralgia, failure to thrive, DM, HTN, GERD, PVD Date of Discharge: 07/26/19 with [facility] Facility: [place]: [place] Medicare Last hospital admission 06/30/19 to 07/10/19  Outreach attempt # 2 successful to daughter as directed in [facility] referral  Patient's daughter Brenda Kerr, is able to verify HIPAA, DOB and address  Facility care- [facility] RN Kerr updated Brenda Kerr on the contact with [facility] RN Kerr with the recommendation to have [place] speak with the facility administrator and offered the number. Brenda Kerr informs [facility] RN Kerr that she has spoke with a nurse at the facility on today 08/09/19 and is requesting the pt medical record information of her PT services activity be sent to Brenda Kerr   Mobility- Brenda Kerr reports Brenda Kerr was seen by [facility] PT (Brenda Kerr) last week once and will begin to be seen twice a week soon as Brenda Kerr is noted to already be showing some improvements  Brenda Kerr is noted to be able to grab utensils and hold food longer. Her dexterity is improving   Primary care services- Brenda Kerr confirms a virtual visit with Brenda Kerr via Zoom was completed on 08/04/19  Brenda Kerr states Brenda Kerr reports the patient can continue virtual visits and office visits are not a necessity at this time  Brenda Kerr states the pure wick was discussed but is not available in the office  The monitoring of the cbg was also discussed and the [facility] RN will be completing labs that will include a check for cbg/A1c at that time Ensure was recommended as a supplement   Memory- Brenda Kerr states the memory issues started with the hospitalization and continued with the facility placement. Brenda Kerr reports continued changes noted since Brenda Haun has  been home like she has awaken in the am "Three in the morning" to request equipment be removed so she can go to her [employer] job "shift change" Brenda Kerr reports Brenda Bodnar has been retired for about 4 years.  Brenda Kerr reports an incident in which the patient had her husband to call Brenda Kerr to come to change her as she did not want one of the other daughters to change her. Brenda Kerr reports Brenda Loyal will desire to have certain foods but not being satisfied with the taste of the food when the food is received Brenda Kerr discussed neurology services for memory concerns and medications that assist with memory (Aricept, Namenda, Seroquel, Brenda Kerr reports Brenda Kerr did provide a medicine to help with her anxiety "but it is not working" yet. THN RN Kerr discussed re evaluating the effectiveness of the new medications after 2-4 weeks use Brenda Kerr voiced understanding. Brenda Kerr reports Brenda Mckeon is very busy verbally and with frequent requests for assistance.   Social: Brenda Kerr is a 78 year old patient who retired from [employer] and lived with husband, Brenda Kerr prior to admission on 06/30/19 but is now residing with one of her daughters, Brenda Kerr and alternating stays with one of her other daughters, Brenda Kerr, so that patient can receive 24 hour supervision. Brenda Kerr reports there are 5 available children to assist. She is reported to be assist/dependent in her care needs related to all her extremity weaknesses. 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, cauda equina compression-Patient was evaluated by neurosurgery-no surgical intervention required., venous stasis ulcers of both legs,back, feet, leg and bilateral hip pain, right knee pain and swelling, anemia, falls, h/o hiatal hernia, sob with walking a long way, dysrhythmia, FTT  glaucoma, partially blind (Brenda Kerr reports pt can see shadows far away but not up close- had 2 laser surgeries- receiving eye gtts She has not had glasses in a while)  Family hx of blindness and alzheimer per Brenda Kerr    DME Kerr walker, dentures  Consent: THN RN Kerr reviewed [facility] services with patient. Patient gave verbal consent for services. Advised patient that other post discharge calls may occur to assess how the patient is doing following the recent hospitalization. Patient voiced understanding and was appreciative of f/u call.  Plan: [facility] RN Kerr will follow up with Brenda Wiatrek and/or her daughter Brenda Kerr in 7-14 business days for further assessment and interventions  Pt encouraged to return a call to [facility] RN Kerr prn  Routed note to MDs/NP/PA  [facility] Kerr involvement barriers letter to Brenda Kerr  [facility] Kerr Care Plan Problem One     Most Recent Value  Care Plan Problem One  knowledge deficit of home care for Hip arthralgia, failure to thrive, DM & HTN  Role Documenting the Problem One  Care Management Telephonic Coordinator  Care Plan for Problem One  Active  [facility] Long Term Goal   over the next 45 days patient and family will be able to verbalize improvement in home care needs for Hip arthralgia, failure to thrive, DM and HTN  during follow up calls  [facility] Long Term Goal Start Date  08/02/19  Interventions for Problem One Long Term Goal  assessed for improvement in home mobility, ADLs, still pending call from [facility] PT staff  [facility] Kerr Short Term Goal #1   over the next 14 days patient will have DME to assist with incontinence as verbalized during follow up calls  [facility] Kerr Short Term Goal #1 Start Date  08/02/19  Interventions for Short Term Goal #1  assesed for pur wick, updated on call to Kerr office about pure wick, Brenda Kerr spoke with Kerr office, DME not in office to check other [place] agencies  [facility] Kerr Short Term Goal #2   over the next 14 days pt will have increase mobility as verbalized during follow up  calls  [facility] Kerr Short Term Goal #2 Start Date  08/02/19  Interventions for Short Term Goal #2  assessed for improvement in home mobility, ADLs, still pending call from [facility] PT staff  [facility] Kerr Short Term Goal #3  over the next 14 days patietn will have follow up visit with primary care Kerr (virtually, telephonically or office visit) as verbalized during follow up call  [facility] Kerr Short Term Goal #3 Start Date  08/02/19  [facility] Kerr Short Term Goal #3 Met Date  08/09/19       [name] L. Lavina Hamman, RN, BSN, [place] Coordinator Office number [phone] Mobile number [phone]  Main THN number [phone] Fax number [phone]

## 2019-08-10 ENCOUNTER — Other Ambulatory Visit: Payer: Self-pay | Admitting: *Deleted

## 2019-08-10 NOTE — Patient Outreach (Signed)
[place] [facility]) Care Management  08/10/2019  NALLELI STATES 22-Jun-1941 [id number]   Care coordination ( calls for pure wick and neurology)   Pure wick [facility] RN CM called and spoke with Nevin Bloodgood at [employer] supply to find out the that Pure wick is not a stocked item but can be ordered. The cost will be at this time out of pocket at $875 for device and all supplies to go with it. It will not be able to be filed with any insurance carrier until the beginning of 2021. The best way for a pt/family to obtain is to call the store at [phone]. THN RN CM spoke with Rodrigo Ran of [employer] to see if [employer] carried the pure wick and was informed [employer] did not   Neurology [facility] RN CM called Dr Fransico Setters office to attempt to speak with the nurse but she was not available (on another call) [facility] RN CM left a voice message discussing the f/u on the pure wick and to inquire about neurology for the patient. Left CM contact numbers and availability.   THN RN CM called to update [name], Daughter HIPAA verified (DOB, address) She voiced appreciation for services rendered   Plan [facility] RN CM will follow up with Mrs Buehner and/or her daughter, [name] within 7-10 business days for further assessment of needs and education of home care  Encouraged to return a call to [facility] RN CM prn  Routed note to MD   [name] L. Lavina Hamman, RN, BSN, [place] Coordinator Office number [phone] Mobile number [phone]  Main THN number [phone] Fax number [phone]

## 2019-08-16 ENCOUNTER — Other Ambulatory Visit: Payer: Self-pay

## 2019-08-16 ENCOUNTER — Other Ambulatory Visit: Payer: Self-pay | Admitting: *Deleted

## 2019-08-16 NOTE — Patient Outreach (Signed)
[facility]) Care Management  08/16/2019  Brenda Kerr [birth year] [id number]   Week 3 follow up for Transition of Care Referral  Referral Date:11/30 Referral Source:[place] [facility] RN CMPost Acute Care Coordinator Date of Admission:07/10/19 Diagnosis:Hip arthralgia, failure to thrive,DM, HTN, GERD, PVD Date of Discharge:07/26/19 [facility] Insurance:NextGen Medicare Last hospital admission10/30/20 to 07/10/19  Outreach attempt #3 successful to daughter as directed in [facility] referral Patient'sdaughter Brenda Kerr, isable to verify HIPAA, DOB and address  Home Therapies/Mobility  Brenda Collazos was seen by [facility] Tuesday and is doing good  Pt is being assisted by [name] sister this week  Brenda Kerr denies any new medical concerns this week  Brenda Kerr reports the family is dealing with passing family members this week  Pure wick- Discussed update from Hulan Fray Corene Cornea) No pure wick alternative [employer] [place] store in [place] will be the first store to get accredated to possible assist with decreasing the cost of the pure wick  [name] has spoken with someone else to find out the pure wick is covered under medicare but the supplies for the pure wick will be $100+  Follow up from Primary care MD office - No return calls from staff at MD office (neurology, endocrinology, pure wick)  as of 08/16/19 Brenda Kerr Updated  Social:Brenda Kerr is a 78 year old patient who retired from [employer] with husband, Jamesprior to admission on 06/30/19 but is now residing with one of her daughters, Brenda Kerr and alternating stays with one of her other daughters, [name], so that patient can receive 24 hour supervision. Brenda Kerr reports there are 5 available children to assist. She is reported to be assist/dependent in her care needs related to all her extremity weaknesses. 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, cauda equina compression-Patient was evaluated by neurosurgery-no surgical intervention required., venous stasis ulcers of both legs,back, feet, leg and bilateral hip pain, right knee pain and swelling, anemia, falls, h/o hiatal hernia, sob with walking a long way, dysrhythmia, FTT glaucoma, partially blind (Brenda Kerr reports pt can see shadows far away but not up close- had 2 laser surgeries- receiving eye gtts She has not had glasses in a while)  Family hx of blindness and alzheimer per Brenda Kerr    DMERolling walker, dentures  Consent:THN RN CM reviewed [facility] services with patient. Patient gave verbal consent for services. Advised patient that other post discharge calls may occur to assess how the patient is doing following the recent hospitalization. Patient voiced understanding and was appreciative of f/u call.  Plan: [name] RN CM willfollow up with Brenda Kubly and/or her daughter Brenda Kerr in 7-14 business days for further assessment and interventions  [facility] CM Care Plan Problem One     Most Recent Value  Care Plan Problem One  knowledge deficit of home care for Hip arthralgia, failure to thrive, DM & HTN  Role Documenting the Problem One  Care Management Telephonic Coordinator  Care Plan for Problem One  Active  THN Long Term Goal   over the next 45 days patient and family will be able to verbalize improvement in home care needs for Hip arthralgia, failure to thrive, DM and HTN  during follow up calls  [facility] Long Term Goal Start Date  08/02/19  [facility] CM Short Term Goal #1  over the next 21 days patient will have DME to assist with incontinence as verbalized during follow up calls  [facility] CM Short Term Goal #1 Start Date  08/16/19  Interventions for Short Term Goal #1  updates from [place], No pure wick alternative per dove, discussed with  Brenda Kerr  THN CM Short Term Goal #2   over the next 14 days pt will have increase mobility as verbalized during follow up calls  [facility] CM Short Term Goal #2 Start Date  08/02/19  Interventions for Short Term Goal #2  assessed mobilty and status of home therapies  THN CM Short Term Goal #3  over the next 14 days patietn will have follow up visit with primary care MD (virtually, telephonically or office visit) as verbalized during follow up call  [facility] CM Short Term Goal #3 Start Date  08/02/19  [facility] CM Short Term Goal #3 Met Date  08/09/19      [name] L. Lavina Hamman, RN, BSN, [place] Coordinator Office number [phone] Mobile number [phone]  Main THN number [phone] Fax number [phone]

## 2019-08-19 DIAGNOSIS — H409 Unspecified glaucoma: Secondary | ICD-10-CM | POA: Diagnosis not present

## 2019-08-19 DIAGNOSIS — Z87440 Personal history of urinary (tract) infections: Secondary | ICD-10-CM | POA: Diagnosis not present

## 2019-08-19 DIAGNOSIS — Z9181 History of falling: Secondary | ICD-10-CM | POA: Diagnosis not present

## 2019-08-19 DIAGNOSIS — M4803 Spinal stenosis, cervicothoracic region: Secondary | ICD-10-CM | POA: Diagnosis not present

## 2019-08-19 DIAGNOSIS — R32 Unspecified urinary incontinence: Secondary | ICD-10-CM | POA: Diagnosis not present

## 2019-08-19 DIAGNOSIS — K219 Gastro-esophageal reflux disease without esophagitis: Secondary | ICD-10-CM | POA: Diagnosis not present

## 2019-08-19 DIAGNOSIS — Z96643 Presence of artificial hip joint, bilateral: Secondary | ICD-10-CM | POA: Diagnosis not present

## 2019-08-19 DIAGNOSIS — I5032 Chronic diastolic (congestive) heart failure: Secondary | ICD-10-CM | POA: Diagnosis not present

## 2019-08-19 DIAGNOSIS — Z7401 Bed confinement status: Secondary | ICD-10-CM | POA: Diagnosis not present

## 2019-08-19 DIAGNOSIS — M5137 Other intervertebral disc degeneration, lumbosacral region: Secondary | ICD-10-CM | POA: Diagnosis not present

## 2019-08-19 DIAGNOSIS — E1151 Type 2 diabetes mellitus with diabetic peripheral angiopathy without gangrene: Secondary | ICD-10-CM | POA: Diagnosis not present

## 2019-08-19 DIAGNOSIS — H5462 Unqualified visual loss, left eye, normal vision right eye: Secondary | ICD-10-CM | POA: Diagnosis not present

## 2019-08-19 DIAGNOSIS — M1712 Unilateral primary osteoarthritis, left knee: Secondary | ICD-10-CM | POA: Diagnosis not present

## 2019-08-19 DIAGNOSIS — R634 Abnormal weight loss: Secondary | ICD-10-CM | POA: Diagnosis not present

## 2019-08-19 DIAGNOSIS — I110 Hypertensive heart disease with heart failure: Secondary | ICD-10-CM | POA: Diagnosis not present

## 2019-08-19 DIAGNOSIS — R41841 Cognitive communication deficit: Secondary | ICD-10-CM | POA: Diagnosis not present

## 2019-08-19 DIAGNOSIS — Z79891 Long term (current) use of opiate analgesic: Secondary | ICD-10-CM | POA: Diagnosis not present

## 2019-08-19 DIAGNOSIS — R627 Adult failure to thrive: Secondary | ICD-10-CM | POA: Diagnosis not present

## 2019-08-19 DIAGNOSIS — M48062 Spinal stenosis, lumbar region with neurogenic claudication: Secondary | ICD-10-CM | POA: Diagnosis not present

## 2019-08-22 DIAGNOSIS — M48062 Spinal stenosis, lumbar region with neurogenic claudication: Secondary | ICD-10-CM | POA: Diagnosis not present

## 2019-08-22 DIAGNOSIS — E1151 Type 2 diabetes mellitus with diabetic peripheral angiopathy without gangrene: Secondary | ICD-10-CM | POA: Diagnosis not present

## 2019-08-22 DIAGNOSIS — M1712 Unilateral primary osteoarthritis, left knee: Secondary | ICD-10-CM | POA: Diagnosis not present

## 2019-08-22 DIAGNOSIS — I110 Hypertensive heart disease with heart failure: Secondary | ICD-10-CM | POA: Diagnosis not present

## 2019-08-22 DIAGNOSIS — M4803 Spinal stenosis, cervicothoracic region: Secondary | ICD-10-CM | POA: Diagnosis not present

## 2019-08-22 DIAGNOSIS — M5137 Other intervertebral disc degeneration, lumbosacral region: Secondary | ICD-10-CM | POA: Diagnosis not present

## 2019-08-31 ENCOUNTER — Other Ambulatory Visit: Payer: Self-pay | Admitting: *Deleted

## 2019-08-31 NOTE — Patient Outreach (Signed)
[facility]) Care Management  08/31/2019  Brenda Kerr 04-27-1941 [id number]   Week 4 follow up forTransition of Care Referral  Referral Date:11/30 Referral Source:[place] [facility] RN CMPost Acute Care Coordinator Date of Admission:07/10/19 Diagnosis:Hip arthralgia, failure to thrive,DM, HTN, GERD, PVD Date of Discharge:07/26/19 [facility] Insurance:NextGen Medicare Last hospital admission10/30/20 to 07/10/19  Outreach attempt #4 successful to daughter as directed in [facility] referral Patient'sdaughter Brenda Kerr, isable to verify HIPAA, DOB and address  Home Therapies/Mobility  Brenda Kerr continues to be seen by HHPT and is doing fair  Brenda Kerr reports they moved her back to her home with her husband, her son and her son's girlfriend.    Brenda Kerr denies any new medical concerns this week   Personal care services  Today, 08/31/19  Brenda Kerr inquired about someone to assist Brenda Petrovich with a bath [facility] RN CM discussed the differences in home health and personal care services Discussed that personal care services generally paid for via medicaid, a long term care policy or an out of pocket expense Brenda Kerr shared that she is aware that Brenda Kerr is not eligible for medicaid at this time  Brenda Older continues to have [facility].  Discussed speaking with Dr Criss Rosales about adding [facility] aide (at the time of this call no return calls from primary care provider (PCP) office to [place])  Also provided ADTS (aging, disability and transit services) for  [facility] for assist with local personal care service program  Sent referral to [facility] SW to provide any other resources [facility] RN CM was not able to provide and a list of [facility] personal care service providers  Pure wick- Still pending   Social:Brenda KEVIONNA HEFFLER is a 78 year old patient who retired from [employer] with husband, Jamesprior to admission on 06/30/19  but is now residing with one of her daughters, [name] and alternating stays with one of her other daughters, [name] that patient can receive 24 hour supervision. Brenda Kerr reports there are 5 available children to assist.She is reported to be assist/dependent in her care needs related to all her extremity weaknesses. 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 hx, cauda equina compression-Patient was evaluated by neurosurgery-no surgical intervention required., venous stasis ulcers of both legs,back, feet, leg and bilateral hip pain, right knee pain and swelling, anemia, falls, h/o hiatal hernia, sob with walking a long way, dysrhythmia, FTT glaucoma, partially blind ([name] reports pt can see shadows far away but not up close- had 2 laser surgeries- receiving eye gtts She has not had glasses in a while)  Family hx of blindness and alzheimer per Brenda Kerr    DMERolling walker, dentures  Consent:THN RN CM reviewed [facility] services with patient. Patient gave verbal consent for services. Advised patient that other post discharge calls may occur to assess how the patient is doing following the recent hospitalization. Patient voiced understanding and was appreciative of f/u call.  Plan: [facility] RN CM willfollow up with Brenda Kerr and/or her daughter Brenda Kerr in 7-14 business days for further assessment and interventions  THN RN CM completed a [facility] referral to request a list of personal care services be sent to the patient's daughter or contacted to provide services [facility] RN CM may not have offered  Routed note to  MDs/PA/NP  [facility] CM Care Plan Problem One     Most Recent Value  Care Plan Problem One  knowledge deficit of home care for Hip arthralgia, failure to thrive, DM & HTN  Role Documenting the Problem One   Care Management Telephonic Coordinator  Care Plan for Problem One  Active  THN Long Term Goal   over the next 45 days patient and family will be able to verbalize improvement in home care needs for Hip arthralgia, failure to thrive, DM and HTN  during follow up calls  [facility] Long Term Goal Start Date  08/02/19  Interventions for Problem One Long Term Goal  Assessed home care [name] updated RN CM offered personal care information discussed differences in home health and personal care services   [facility] CM Short Term Goal #1   over the next 21 days patient will have DME to assist with incontinence as verbalized during follow up calls  [facility] CM Short Term Goal #1 Start Date  08/31/19  Interventions for Short Term Goal #1  still pending   THN CM Short Term Goal #2   over the next 14 days pt will have increase mobility as verbalized during follow up calls  [facility] CM Short Term Goal #2 Start Date  08/31/19  Interventions for Short Term Goal #2  Continues to work with [place] referral for personal care services list   [facility] CM Short Term Goal #3  over the next 14 days patietn will have follow up visit with primary care MD (virtually, telephonically or office visit) as verbalized during follow up call  [facility] CM Short Term Goal #3 Start Date  08/02/19  [facility] CM Short Term Goal #3 Met Date  08/09/19      [name] L. Lavina Hamman, RN, BSN, [place] Coordinator Office number [phone] Mobile number [phone]  Main THN number [phone] Fax number [phone]

## 2019-09-05 ENCOUNTER — Other Ambulatory Visit: Payer: Self-pay

## 2019-09-05 NOTE — Patient Outreach (Signed)
[facility]) Care Management  09/05/2019  Brenda Kerr 1940/10/09 [id number]   Social work referral received on 09/04/19 from [facility], Joellyn Quails, regarding resources for personal care services.  Attempted to contact patient's daughter today as this was who Ms. Lavina Hamman communicated with regarding referral to social work.  Left voicemail message and mailed unsuccessful outreach letter. Will attempt to reach again within four business days if no return call.  Ronn Melena, BSW Social Worker [phone]

## 2019-09-07 ENCOUNTER — Other Ambulatory Visit: Payer: Self-pay

## 2019-09-07 DIAGNOSIS — M4803 Spinal stenosis, cervicothoracic region: Secondary | ICD-10-CM | POA: Diagnosis not present

## 2019-09-07 DIAGNOSIS — I110 Hypertensive heart disease with heart failure: Secondary | ICD-10-CM | POA: Diagnosis not present

## 2019-09-07 DIAGNOSIS — M1712 Unilateral primary osteoarthritis, left knee: Secondary | ICD-10-CM | POA: Diagnosis not present

## 2019-09-07 DIAGNOSIS — M5137 Other intervertebral disc degeneration, lumbosacral region: Secondary | ICD-10-CM | POA: Diagnosis not present

## 2019-09-07 DIAGNOSIS — E1151 Type 2 diabetes mellitus with diabetic peripheral angiopathy without gangrene: Secondary | ICD-10-CM | POA: Diagnosis not present

## 2019-09-07 DIAGNOSIS — M48062 Spinal stenosis, lumbar region with neurogenic claudication: Secondary | ICD-10-CM | POA: Diagnosis not present

## 2019-09-07 NOTE — Patient Outreach (Signed)
[facility]) Care Management  09/07/2019  TANYRA POTTINGER 1940/10/07 [id number]   Successful outreach to patient's daughter, [name], regarding social work referral for resources for personal care services.   Explained reason for referral to daughter and she stated that they are no longer seeking services at this time as they have someone assisting patient. Did inform daughter that long-term, in-home aide services are not covered by Medicare.  Per daughter, patient previously applied for Medicaid and is not eligible.   Informed daughter about the Home and [place] through ADTS of [employer].  Informed her that service has a wait list and encouraged her to go ahead and call if family anticipates that service may be needed at some point.  Provided her with contact information. Closing social work case at this time but did encourage her to call if additional needs arise.  Ronn Melena, BSW Social Worker [phone]

## 2019-09-08 ENCOUNTER — Other Ambulatory Visit: Payer: Self-pay | Admitting: *Deleted

## 2019-09-08 NOTE — Patient Outreach (Signed)
  [facility]) Care Management  09/08/2019  Brenda Kerr 1941/07/30 [id number]   Week44follow up forTransition of Care Referral  Referral Date:11/30 Referral Source:[place] [facility] RN CMPost Acute Care Coordinator Date of Admission:07/10/19 Diagnosis:Hip arthralgia, failure to thrive,DM, HTN, GERD, PVD Date of Discharge:07/26/[facility] Insurance:NextGen Medicare Last hospital admission10/30/20 to 07/10/19   Outreach attempt #5 unsuccessful to daughter as directed in [facility] referral  No answer. THN RN CM left HIPAA compliant voicemail message along with CM's contact info.  Encouraged a return call to review contact with [facility] medical supply store in [place] related to pure wick and insurance filing possible at end of year. Christie at [place] recommends the family also contact Pure wick distributor directly via phone or on line  Since last contact on 08/31/19 [facility] SW was noted to have been completed on 09/07/19  Plan: [place] scheduled this patient for another call attempt within 14-21 business days  Kathryn L. Lavina Hamman, RN, BSN, [place] Coordinator Office number [phone] Mobile number [phone]  Main THN number [phone] Fax number [phone]

## 2019-09-11 DIAGNOSIS — M48062 Spinal stenosis, lumbar region with neurogenic claudication: Secondary | ICD-10-CM | POA: Diagnosis not present

## 2019-09-11 DIAGNOSIS — M4803 Spinal stenosis, cervicothoracic region: Secondary | ICD-10-CM | POA: Diagnosis not present

## 2019-09-11 DIAGNOSIS — E1151 Type 2 diabetes mellitus with diabetic peripheral angiopathy without gangrene: Secondary | ICD-10-CM | POA: Diagnosis not present

## 2019-09-11 DIAGNOSIS — I110 Hypertensive heart disease with heart failure: Secondary | ICD-10-CM | POA: Diagnosis not present

## 2019-09-11 DIAGNOSIS — M5137 Other intervertebral disc degeneration, lumbosacral region: Secondary | ICD-10-CM | POA: Diagnosis not present

## 2019-09-11 DIAGNOSIS — M1712 Unilateral primary osteoarthritis, left knee: Secondary | ICD-10-CM | POA: Diagnosis not present

## 2019-09-18 DIAGNOSIS — K219 Gastro-esophageal reflux disease without esophagitis: Secondary | ICD-10-CM | POA: Diagnosis not present

## 2019-09-18 DIAGNOSIS — Z79891 Long term (current) use of opiate analgesic: Secondary | ICD-10-CM | POA: Diagnosis not present

## 2019-09-18 DIAGNOSIS — M1712 Unilateral primary osteoarthritis, left knee: Secondary | ICD-10-CM | POA: Diagnosis not present

## 2019-09-18 DIAGNOSIS — I5032 Chronic diastolic (congestive) heart failure: Secondary | ICD-10-CM | POA: Diagnosis not present

## 2019-09-18 DIAGNOSIS — Z9181 History of falling: Secondary | ICD-10-CM | POA: Diagnosis not present

## 2019-09-18 DIAGNOSIS — I110 Hypertensive heart disease with heart failure: Secondary | ICD-10-CM | POA: Diagnosis not present

## 2019-09-18 DIAGNOSIS — Z87440 Personal history of urinary (tract) infections: Secondary | ICD-10-CM | POA: Diagnosis not present

## 2019-09-18 DIAGNOSIS — E1151 Type 2 diabetes mellitus with diabetic peripheral angiopathy without gangrene: Secondary | ICD-10-CM | POA: Diagnosis not present

## 2019-09-18 DIAGNOSIS — R634 Abnormal weight loss: Secondary | ICD-10-CM | POA: Diagnosis not present

## 2019-09-18 DIAGNOSIS — Z96643 Presence of artificial hip joint, bilateral: Secondary | ICD-10-CM | POA: Diagnosis not present

## 2019-09-18 DIAGNOSIS — M4803 Spinal stenosis, cervicothoracic region: Secondary | ICD-10-CM | POA: Diagnosis not present

## 2019-09-18 DIAGNOSIS — Z7401 Bed confinement status: Secondary | ICD-10-CM | POA: Diagnosis not present

## 2019-09-18 DIAGNOSIS — R627 Adult failure to thrive: Secondary | ICD-10-CM | POA: Diagnosis not present

## 2019-09-18 DIAGNOSIS — M5137 Other intervertebral disc degeneration, lumbosacral region: Secondary | ICD-10-CM | POA: Diagnosis not present

## 2019-09-18 DIAGNOSIS — R41841 Cognitive communication deficit: Secondary | ICD-10-CM | POA: Diagnosis not present

## 2019-09-18 DIAGNOSIS — R32 Unspecified urinary incontinence: Secondary | ICD-10-CM | POA: Diagnosis not present

## 2019-09-18 DIAGNOSIS — H5462 Unqualified visual loss, left eye, normal vision right eye: Secondary | ICD-10-CM | POA: Diagnosis not present

## 2019-09-18 DIAGNOSIS — M48062 Spinal stenosis, lumbar region with neurogenic claudication: Secondary | ICD-10-CM | POA: Diagnosis not present

## 2019-09-18 DIAGNOSIS — H409 Unspecified glaucoma: Secondary | ICD-10-CM | POA: Diagnosis not present

## 2019-09-21 DIAGNOSIS — M4803 Spinal stenosis, cervicothoracic region: Secondary | ICD-10-CM | POA: Diagnosis not present

## 2019-09-21 DIAGNOSIS — M48062 Spinal stenosis, lumbar region with neurogenic claudication: Secondary | ICD-10-CM | POA: Diagnosis not present

## 2019-09-21 DIAGNOSIS — E1151 Type 2 diabetes mellitus with diabetic peripheral angiopathy without gangrene: Secondary | ICD-10-CM | POA: Diagnosis not present

## 2019-09-21 DIAGNOSIS — M1712 Unilateral primary osteoarthritis, left knee: Secondary | ICD-10-CM | POA: Diagnosis not present

## 2019-09-21 DIAGNOSIS — M5137 Other intervertebral disc degeneration, lumbosacral region: Secondary | ICD-10-CM | POA: Diagnosis not present

## 2019-09-21 DIAGNOSIS — I110 Hypertensive heart disease with heart failure: Secondary | ICD-10-CM | POA: Diagnosis not present

## 2019-09-22 ENCOUNTER — Other Ambulatory Visit: Payer: Self-pay | Admitting: *Deleted

## 2019-09-22 NOTE — Patient Outreach (Signed)
[facility]) Care Management  09/22/2019  MARLETTE NJOKU 11/01/1940 [id number]  THN Follow up for Complex care patient   Mrs Chica was referred to [facility] on 07/31/19 after he discharge from [facility] for Complex care services/. Transition of care services were completed for 4 weeks Messages left with her daughter, [name] on 09/08/19   Today [facility] RN CM called the home number and her husband, [name] states the patient if not available for [facility] RN CM to speak with and referred [facility] RN CM to her daughter Tarri Fuller attempt to [place]  No answer. THN RN CM left HIPAA compliant voicemail message along with CM's contact info. THN RN CM encouraged a return call prn and especially if pt developed care coordination needs   Plan: [facility] RN CM scheduled this patient for another call attempt within 91-98 business days  Wonder Donaway L. Lavina Hamman, RN, BSN, [place] Coordinator Office number [phone] Mobile number [phone]  Main THN number [phone] Fax number [phone]

## 2019-09-25 DIAGNOSIS — E1151 Type 2 diabetes mellitus with diabetic peripheral angiopathy without gangrene: Secondary | ICD-10-CM | POA: Diagnosis not present

## 2019-09-25 DIAGNOSIS — M48062 Spinal stenosis, lumbar region with neurogenic claudication: Secondary | ICD-10-CM | POA: Diagnosis not present

## 2019-09-25 DIAGNOSIS — M5137 Other intervertebral disc degeneration, lumbosacral region: Secondary | ICD-10-CM | POA: Diagnosis not present

## 2019-09-25 DIAGNOSIS — M1712 Unilateral primary osteoarthritis, left knee: Secondary | ICD-10-CM | POA: Diagnosis not present

## 2019-09-25 DIAGNOSIS — I110 Hypertensive heart disease with heart failure: Secondary | ICD-10-CM | POA: Diagnosis not present

## 2019-09-25 DIAGNOSIS — M4803 Spinal stenosis, cervicothoracic region: Secondary | ICD-10-CM | POA: Diagnosis not present

## 2019-10-02 DIAGNOSIS — M4800 Spinal stenosis, site unspecified: Secondary | ICD-10-CM | POA: Diagnosis not present

## 2019-10-02 DIAGNOSIS — F0280 Dementia in other diseases classified elsewhere without behavioral disturbance: Secondary | ICD-10-CM | POA: Diagnosis not present

## 2019-10-02 DIAGNOSIS — I10 Essential (primary) hypertension: Secondary | ICD-10-CM | POA: Diagnosis not present

## 2019-10-02 DIAGNOSIS — G311 Senile degeneration of brain, not elsewhere classified: Secondary | ICD-10-CM | POA: Diagnosis not present

## 2019-10-02 DIAGNOSIS — I509 Heart failure, unspecified: Secondary | ICD-10-CM | POA: Diagnosis not present

## 2019-10-02 DIAGNOSIS — Z515 Encounter for palliative care: Secondary | ICD-10-CM | POA: Diagnosis not present

## 2019-10-02 DIAGNOSIS — D649 Anemia, unspecified: Secondary | ICD-10-CM | POA: Diagnosis not present

## 2019-10-02 DIAGNOSIS — R296 Repeated falls: Secondary | ICD-10-CM | POA: Diagnosis not present

## 2019-10-04 DIAGNOSIS — F0280 Dementia in other diseases classified elsewhere without behavioral disturbance: Secondary | ICD-10-CM | POA: Diagnosis not present

## 2019-10-04 DIAGNOSIS — I509 Heart failure, unspecified: Secondary | ICD-10-CM | POA: Diagnosis not present

## 2019-10-04 DIAGNOSIS — D649 Anemia, unspecified: Secondary | ICD-10-CM | POA: Diagnosis not present

## 2019-10-04 DIAGNOSIS — M4800 Spinal stenosis, site unspecified: Secondary | ICD-10-CM | POA: Diagnosis not present

## 2019-10-04 DIAGNOSIS — R296 Repeated falls: Secondary | ICD-10-CM | POA: Diagnosis not present

## 2019-10-04 DIAGNOSIS — G311 Senile degeneration of brain, not elsewhere classified: Secondary | ICD-10-CM | POA: Diagnosis not present

## 2019-10-05 DIAGNOSIS — I509 Heart failure, unspecified: Secondary | ICD-10-CM | POA: Diagnosis not present

## 2019-10-05 DIAGNOSIS — F0280 Dementia in other diseases classified elsewhere without behavioral disturbance: Secondary | ICD-10-CM | POA: Diagnosis not present

## 2019-10-05 DIAGNOSIS — R296 Repeated falls: Secondary | ICD-10-CM | POA: Diagnosis not present

## 2019-10-05 DIAGNOSIS — M4800 Spinal stenosis, site unspecified: Secondary | ICD-10-CM | POA: Diagnosis not present

## 2019-10-05 DIAGNOSIS — G311 Senile degeneration of brain, not elsewhere classified: Secondary | ICD-10-CM | POA: Diagnosis not present

## 2019-10-05 DIAGNOSIS — D649 Anemia, unspecified: Secondary | ICD-10-CM | POA: Diagnosis not present

## 2019-10-10 DIAGNOSIS — G311 Senile degeneration of brain, not elsewhere classified: Secondary | ICD-10-CM | POA: Diagnosis not present

## 2019-10-10 DIAGNOSIS — M4800 Spinal stenosis, site unspecified: Secondary | ICD-10-CM | POA: Diagnosis not present

## 2019-10-10 DIAGNOSIS — F0280 Dementia in other diseases classified elsewhere without behavioral disturbance: Secondary | ICD-10-CM | POA: Diagnosis not present

## 2019-10-10 DIAGNOSIS — D649 Anemia, unspecified: Secondary | ICD-10-CM | POA: Diagnosis not present

## 2019-10-10 DIAGNOSIS — I509 Heart failure, unspecified: Secondary | ICD-10-CM | POA: Diagnosis not present

## 2019-10-10 DIAGNOSIS — R296 Repeated falls: Secondary | ICD-10-CM | POA: Diagnosis not present

## 2019-10-12 DIAGNOSIS — R296 Repeated falls: Secondary | ICD-10-CM | POA: Diagnosis not present

## 2019-10-12 DIAGNOSIS — G311 Senile degeneration of brain, not elsewhere classified: Secondary | ICD-10-CM | POA: Diagnosis not present

## 2019-10-12 DIAGNOSIS — I509 Heart failure, unspecified: Secondary | ICD-10-CM | POA: Diagnosis not present

## 2019-10-12 DIAGNOSIS — M4800 Spinal stenosis, site unspecified: Secondary | ICD-10-CM | POA: Diagnosis not present

## 2019-10-12 DIAGNOSIS — F0280 Dementia in other diseases classified elsewhere without behavioral disturbance: Secondary | ICD-10-CM | POA: Diagnosis not present

## 2019-10-12 DIAGNOSIS — D649 Anemia, unspecified: Secondary | ICD-10-CM | POA: Diagnosis not present

## 2019-10-13 DIAGNOSIS — I509 Heart failure, unspecified: Secondary | ICD-10-CM | POA: Diagnosis not present

## 2019-10-13 DIAGNOSIS — M4800 Spinal stenosis, site unspecified: Secondary | ICD-10-CM | POA: Diagnosis not present

## 2019-10-13 DIAGNOSIS — G311 Senile degeneration of brain, not elsewhere classified: Secondary | ICD-10-CM | POA: Diagnosis not present

## 2019-10-13 DIAGNOSIS — F0280 Dementia in other diseases classified elsewhere without behavioral disturbance: Secondary | ICD-10-CM | POA: Diagnosis not present

## 2019-10-13 DIAGNOSIS — D649 Anemia, unspecified: Secondary | ICD-10-CM | POA: Diagnosis not present

## 2019-10-13 DIAGNOSIS — R296 Repeated falls: Secondary | ICD-10-CM | POA: Diagnosis not present

## 2019-10-16 DIAGNOSIS — D649 Anemia, unspecified: Secondary | ICD-10-CM | POA: Diagnosis not present

## 2019-10-16 DIAGNOSIS — R296 Repeated falls: Secondary | ICD-10-CM | POA: Diagnosis not present

## 2019-10-16 DIAGNOSIS — I509 Heart failure, unspecified: Secondary | ICD-10-CM | POA: Diagnosis not present

## 2019-10-16 DIAGNOSIS — G311 Senile degeneration of brain, not elsewhere classified: Secondary | ICD-10-CM | POA: Diagnosis not present

## 2019-10-16 DIAGNOSIS — F0280 Dementia in other diseases classified elsewhere without behavioral disturbance: Secondary | ICD-10-CM | POA: Diagnosis not present

## 2019-10-16 DIAGNOSIS — M4800 Spinal stenosis, site unspecified: Secondary | ICD-10-CM | POA: Diagnosis not present

## 2019-10-17 DIAGNOSIS — I509 Heart failure, unspecified: Secondary | ICD-10-CM | POA: Diagnosis not present

## 2019-10-17 DIAGNOSIS — F0280 Dementia in other diseases classified elsewhere without behavioral disturbance: Secondary | ICD-10-CM | POA: Diagnosis not present

## 2019-10-17 DIAGNOSIS — G311 Senile degeneration of brain, not elsewhere classified: Secondary | ICD-10-CM | POA: Diagnosis not present

## 2019-10-17 DIAGNOSIS — D649 Anemia, unspecified: Secondary | ICD-10-CM | POA: Diagnosis not present

## 2019-10-17 DIAGNOSIS — M4800 Spinal stenosis, site unspecified: Secondary | ICD-10-CM | POA: Diagnosis not present

## 2019-10-17 DIAGNOSIS — R296 Repeated falls: Secondary | ICD-10-CM | POA: Diagnosis not present

## 2019-10-24 DIAGNOSIS — F0280 Dementia in other diseases classified elsewhere without behavioral disturbance: Secondary | ICD-10-CM | POA: Diagnosis not present

## 2019-10-24 DIAGNOSIS — G311 Senile degeneration of brain, not elsewhere classified: Secondary | ICD-10-CM | POA: Diagnosis not present

## 2019-10-24 DIAGNOSIS — I509 Heart failure, unspecified: Secondary | ICD-10-CM | POA: Diagnosis not present

## 2019-10-24 DIAGNOSIS — R296 Repeated falls: Secondary | ICD-10-CM | POA: Diagnosis not present

## 2019-10-24 DIAGNOSIS — M4800 Spinal stenosis, site unspecified: Secondary | ICD-10-CM | POA: Diagnosis not present

## 2019-10-24 DIAGNOSIS — D649 Anemia, unspecified: Secondary | ICD-10-CM | POA: Diagnosis not present

## 2019-10-26 DIAGNOSIS — M4800 Spinal stenosis, site unspecified: Secondary | ICD-10-CM | POA: Diagnosis not present

## 2019-10-26 DIAGNOSIS — G311 Senile degeneration of brain, not elsewhere classified: Secondary | ICD-10-CM | POA: Diagnosis not present

## 2019-10-26 DIAGNOSIS — F0280 Dementia in other diseases classified elsewhere without behavioral disturbance: Secondary | ICD-10-CM | POA: Diagnosis not present

## 2019-10-26 DIAGNOSIS — I509 Heart failure, unspecified: Secondary | ICD-10-CM | POA: Diagnosis not present

## 2019-10-26 DIAGNOSIS — D649 Anemia, unspecified: Secondary | ICD-10-CM | POA: Diagnosis not present

## 2019-10-26 DIAGNOSIS — R296 Repeated falls: Secondary | ICD-10-CM | POA: Diagnosis not present

## 2019-10-27 DIAGNOSIS — I509 Heart failure, unspecified: Secondary | ICD-10-CM | POA: Diagnosis not present

## 2019-10-27 DIAGNOSIS — R296 Repeated falls: Secondary | ICD-10-CM | POA: Diagnosis not present

## 2019-10-27 DIAGNOSIS — M4800 Spinal stenosis, site unspecified: Secondary | ICD-10-CM | POA: Diagnosis not present

## 2019-10-27 DIAGNOSIS — F0280 Dementia in other diseases classified elsewhere without behavioral disturbance: Secondary | ICD-10-CM | POA: Diagnosis not present

## 2019-10-27 DIAGNOSIS — D649 Anemia, unspecified: Secondary | ICD-10-CM | POA: Diagnosis not present

## 2019-10-27 DIAGNOSIS — G311 Senile degeneration of brain, not elsewhere classified: Secondary | ICD-10-CM | POA: Diagnosis not present

## 2019-10-30 DIAGNOSIS — G311 Senile degeneration of brain, not elsewhere classified: Secondary | ICD-10-CM | POA: Diagnosis not present

## 2019-10-30 DIAGNOSIS — F0280 Dementia in other diseases classified elsewhere without behavioral disturbance: Secondary | ICD-10-CM | POA: Diagnosis not present

## 2019-10-30 DIAGNOSIS — D649 Anemia, unspecified: Secondary | ICD-10-CM | POA: Diagnosis not present

## 2019-10-30 DIAGNOSIS — I10 Essential (primary) hypertension: Secondary | ICD-10-CM | POA: Diagnosis not present

## 2019-10-30 DIAGNOSIS — R296 Repeated falls: Secondary | ICD-10-CM | POA: Diagnosis not present

## 2019-10-30 DIAGNOSIS — I509 Heart failure, unspecified: Secondary | ICD-10-CM | POA: Diagnosis not present

## 2019-10-30 DIAGNOSIS — Z515 Encounter for palliative care: Secondary | ICD-10-CM | POA: Diagnosis not present

## 2019-10-30 DIAGNOSIS — M4800 Spinal stenosis, site unspecified: Secondary | ICD-10-CM | POA: Diagnosis not present

## 2019-10-31 DIAGNOSIS — I509 Heart failure, unspecified: Secondary | ICD-10-CM | POA: Diagnosis not present

## 2019-10-31 DIAGNOSIS — D649 Anemia, unspecified: Secondary | ICD-10-CM | POA: Diagnosis not present

## 2019-10-31 DIAGNOSIS — R296 Repeated falls: Secondary | ICD-10-CM | POA: Diagnosis not present

## 2019-10-31 DIAGNOSIS — M4800 Spinal stenosis, site unspecified: Secondary | ICD-10-CM | POA: Diagnosis not present

## 2019-10-31 DIAGNOSIS — G311 Senile degeneration of brain, not elsewhere classified: Secondary | ICD-10-CM | POA: Diagnosis not present

## 2019-10-31 DIAGNOSIS — F0280 Dementia in other diseases classified elsewhere without behavioral disturbance: Secondary | ICD-10-CM | POA: Diagnosis not present

## 2019-11-01 DIAGNOSIS — R296 Repeated falls: Secondary | ICD-10-CM | POA: Diagnosis not present

## 2019-11-01 DIAGNOSIS — I509 Heart failure, unspecified: Secondary | ICD-10-CM | POA: Diagnosis not present

## 2019-11-01 DIAGNOSIS — D649 Anemia, unspecified: Secondary | ICD-10-CM | POA: Diagnosis not present

## 2019-11-01 DIAGNOSIS — M4800 Spinal stenosis, site unspecified: Secondary | ICD-10-CM | POA: Diagnosis not present

## 2019-11-01 DIAGNOSIS — F0280 Dementia in other diseases classified elsewhere without behavioral disturbance: Secondary | ICD-10-CM | POA: Diagnosis not present

## 2019-11-01 DIAGNOSIS — G311 Senile degeneration of brain, not elsewhere classified: Secondary | ICD-10-CM | POA: Diagnosis not present

## 2019-11-02 DIAGNOSIS — R296 Repeated falls: Secondary | ICD-10-CM | POA: Diagnosis not present

## 2019-11-02 DIAGNOSIS — F0280 Dementia in other diseases classified elsewhere without behavioral disturbance: Secondary | ICD-10-CM | POA: Diagnosis not present

## 2019-11-02 DIAGNOSIS — I509 Heart failure, unspecified: Secondary | ICD-10-CM | POA: Diagnosis not present

## 2019-11-02 DIAGNOSIS — G311 Senile degeneration of brain, not elsewhere classified: Secondary | ICD-10-CM | POA: Diagnosis not present

## 2019-11-02 DIAGNOSIS — D649 Anemia, unspecified: Secondary | ICD-10-CM | POA: Diagnosis not present

## 2019-11-02 DIAGNOSIS — M4800 Spinal stenosis, site unspecified: Secondary | ICD-10-CM | POA: Diagnosis not present

## 2019-11-03 DIAGNOSIS — D649 Anemia, unspecified: Secondary | ICD-10-CM | POA: Diagnosis not present

## 2019-11-03 DIAGNOSIS — M4800 Spinal stenosis, site unspecified: Secondary | ICD-10-CM | POA: Diagnosis not present

## 2019-11-03 DIAGNOSIS — F0280 Dementia in other diseases classified elsewhere without behavioral disturbance: Secondary | ICD-10-CM | POA: Diagnosis not present

## 2019-11-03 DIAGNOSIS — I509 Heart failure, unspecified: Secondary | ICD-10-CM | POA: Diagnosis not present

## 2019-11-03 DIAGNOSIS — G311 Senile degeneration of brain, not elsewhere classified: Secondary | ICD-10-CM | POA: Diagnosis not present

## 2019-11-03 DIAGNOSIS — R296 Repeated falls: Secondary | ICD-10-CM | POA: Diagnosis not present

## 2019-11-06 DIAGNOSIS — F0280 Dementia in other diseases classified elsewhere without behavioral disturbance: Secondary | ICD-10-CM | POA: Diagnosis not present

## 2019-11-06 DIAGNOSIS — I509 Heart failure, unspecified: Secondary | ICD-10-CM | POA: Diagnosis not present

## 2019-11-06 DIAGNOSIS — G311 Senile degeneration of brain, not elsewhere classified: Secondary | ICD-10-CM | POA: Diagnosis not present

## 2019-11-06 DIAGNOSIS — M4800 Spinal stenosis, site unspecified: Secondary | ICD-10-CM | POA: Diagnosis not present

## 2019-11-06 DIAGNOSIS — D649 Anemia, unspecified: Secondary | ICD-10-CM | POA: Diagnosis not present

## 2019-11-06 DIAGNOSIS — R296 Repeated falls: Secondary | ICD-10-CM | POA: Diagnosis not present

## 2019-11-07 DIAGNOSIS — I509 Heart failure, unspecified: Secondary | ICD-10-CM | POA: Diagnosis not present

## 2019-11-07 DIAGNOSIS — R296 Repeated falls: Secondary | ICD-10-CM | POA: Diagnosis not present

## 2019-11-07 DIAGNOSIS — F0280 Dementia in other diseases classified elsewhere without behavioral disturbance: Secondary | ICD-10-CM | POA: Diagnosis not present

## 2019-11-07 DIAGNOSIS — G311 Senile degeneration of brain, not elsewhere classified: Secondary | ICD-10-CM | POA: Diagnosis not present

## 2019-11-07 DIAGNOSIS — M4800 Spinal stenosis, site unspecified: Secondary | ICD-10-CM | POA: Diagnosis not present

## 2019-11-07 DIAGNOSIS — D649 Anemia, unspecified: Secondary | ICD-10-CM | POA: Diagnosis not present

## 2019-11-08 DIAGNOSIS — G311 Senile degeneration of brain, not elsewhere classified: Secondary | ICD-10-CM | POA: Diagnosis not present

## 2019-11-08 DIAGNOSIS — I509 Heart failure, unspecified: Secondary | ICD-10-CM | POA: Diagnosis not present

## 2019-11-08 DIAGNOSIS — F0280 Dementia in other diseases classified elsewhere without behavioral disturbance: Secondary | ICD-10-CM | POA: Diagnosis not present

## 2019-11-08 DIAGNOSIS — D649 Anemia, unspecified: Secondary | ICD-10-CM | POA: Diagnosis not present

## 2019-11-08 DIAGNOSIS — R296 Repeated falls: Secondary | ICD-10-CM | POA: Diagnosis not present

## 2019-11-08 DIAGNOSIS — M4800 Spinal stenosis, site unspecified: Secondary | ICD-10-CM | POA: Diagnosis not present

## 2019-11-09 DIAGNOSIS — M4800 Spinal stenosis, site unspecified: Secondary | ICD-10-CM | POA: Diagnosis not present

## 2019-11-09 DIAGNOSIS — R296 Repeated falls: Secondary | ICD-10-CM | POA: Diagnosis not present

## 2019-11-09 DIAGNOSIS — I509 Heart failure, unspecified: Secondary | ICD-10-CM | POA: Diagnosis not present

## 2019-11-09 DIAGNOSIS — G311 Senile degeneration of brain, not elsewhere classified: Secondary | ICD-10-CM | POA: Diagnosis not present

## 2019-11-09 DIAGNOSIS — D649 Anemia, unspecified: Secondary | ICD-10-CM | POA: Diagnosis not present

## 2019-11-09 DIAGNOSIS — F0280 Dementia in other diseases classified elsewhere without behavioral disturbance: Secondary | ICD-10-CM | POA: Diagnosis not present

## 2019-11-10 DIAGNOSIS — G311 Senile degeneration of brain, not elsewhere classified: Secondary | ICD-10-CM | POA: Diagnosis not present

## 2019-11-10 DIAGNOSIS — I509 Heart failure, unspecified: Secondary | ICD-10-CM | POA: Diagnosis not present

## 2019-11-10 DIAGNOSIS — F0280 Dementia in other diseases classified elsewhere without behavioral disturbance: Secondary | ICD-10-CM | POA: Diagnosis not present

## 2019-11-10 DIAGNOSIS — R296 Repeated falls: Secondary | ICD-10-CM | POA: Diagnosis not present

## 2019-11-10 DIAGNOSIS — M4800 Spinal stenosis, site unspecified: Secondary | ICD-10-CM | POA: Diagnosis not present

## 2019-11-10 DIAGNOSIS — D649 Anemia, unspecified: Secondary | ICD-10-CM | POA: Diagnosis not present

## 2019-11-13 DIAGNOSIS — I509 Heart failure, unspecified: Secondary | ICD-10-CM | POA: Diagnosis not present

## 2019-11-13 DIAGNOSIS — F0280 Dementia in other diseases classified elsewhere without behavioral disturbance: Secondary | ICD-10-CM | POA: Diagnosis not present

## 2019-11-13 DIAGNOSIS — G311 Senile degeneration of brain, not elsewhere classified: Secondary | ICD-10-CM | POA: Diagnosis not present

## 2019-11-13 DIAGNOSIS — D649 Anemia, unspecified: Secondary | ICD-10-CM | POA: Diagnosis not present

## 2019-11-13 DIAGNOSIS — R296 Repeated falls: Secondary | ICD-10-CM | POA: Diagnosis not present

## 2019-11-13 DIAGNOSIS — M4800 Spinal stenosis, site unspecified: Secondary | ICD-10-CM | POA: Diagnosis not present

## 2019-11-14 DIAGNOSIS — R296 Repeated falls: Secondary | ICD-10-CM | POA: Diagnosis not present

## 2019-11-14 DIAGNOSIS — G311 Senile degeneration of brain, not elsewhere classified: Secondary | ICD-10-CM | POA: Diagnosis not present

## 2019-11-14 DIAGNOSIS — M4800 Spinal stenosis, site unspecified: Secondary | ICD-10-CM | POA: Diagnosis not present

## 2019-11-14 DIAGNOSIS — D649 Anemia, unspecified: Secondary | ICD-10-CM | POA: Diagnosis not present

## 2019-11-14 DIAGNOSIS — I509 Heart failure, unspecified: Secondary | ICD-10-CM | POA: Diagnosis not present

## 2019-11-14 DIAGNOSIS — F0280 Dementia in other diseases classified elsewhere without behavioral disturbance: Secondary | ICD-10-CM | POA: Diagnosis not present

## 2019-11-15 DIAGNOSIS — R296 Repeated falls: Secondary | ICD-10-CM | POA: Diagnosis not present

## 2019-11-15 DIAGNOSIS — G311 Senile degeneration of brain, not elsewhere classified: Secondary | ICD-10-CM | POA: Diagnosis not present

## 2019-11-15 DIAGNOSIS — I509 Heart failure, unspecified: Secondary | ICD-10-CM | POA: Diagnosis not present

## 2019-11-15 DIAGNOSIS — M4800 Spinal stenosis, site unspecified: Secondary | ICD-10-CM | POA: Diagnosis not present

## 2019-11-15 DIAGNOSIS — F0280 Dementia in other diseases classified elsewhere without behavioral disturbance: Secondary | ICD-10-CM | POA: Diagnosis not present

## 2019-11-15 DIAGNOSIS — D649 Anemia, unspecified: Secondary | ICD-10-CM | POA: Diagnosis not present

## 2019-11-16 DIAGNOSIS — I509 Heart failure, unspecified: Secondary | ICD-10-CM | POA: Diagnosis not present

## 2019-11-16 DIAGNOSIS — R296 Repeated falls: Secondary | ICD-10-CM | POA: Diagnosis not present

## 2019-11-16 DIAGNOSIS — M4800 Spinal stenosis, site unspecified: Secondary | ICD-10-CM | POA: Diagnosis not present

## 2019-11-16 DIAGNOSIS — F0280 Dementia in other diseases classified elsewhere without behavioral disturbance: Secondary | ICD-10-CM | POA: Diagnosis not present

## 2019-11-16 DIAGNOSIS — D649 Anemia, unspecified: Secondary | ICD-10-CM | POA: Diagnosis not present

## 2019-11-16 DIAGNOSIS — G311 Senile degeneration of brain, not elsewhere classified: Secondary | ICD-10-CM | POA: Diagnosis not present

## 2019-11-17 DIAGNOSIS — D649 Anemia, unspecified: Secondary | ICD-10-CM | POA: Diagnosis not present

## 2019-11-17 DIAGNOSIS — G311 Senile degeneration of brain, not elsewhere classified: Secondary | ICD-10-CM | POA: Diagnosis not present

## 2019-11-17 DIAGNOSIS — M4800 Spinal stenosis, site unspecified: Secondary | ICD-10-CM | POA: Diagnosis not present

## 2019-11-17 DIAGNOSIS — F0280 Dementia in other diseases classified elsewhere without behavioral disturbance: Secondary | ICD-10-CM | POA: Diagnosis not present

## 2019-11-17 DIAGNOSIS — R296 Repeated falls: Secondary | ICD-10-CM | POA: Diagnosis not present

## 2019-11-17 DIAGNOSIS — I509 Heart failure, unspecified: Secondary | ICD-10-CM | POA: Diagnosis not present

## 2019-11-20 DIAGNOSIS — D649 Anemia, unspecified: Secondary | ICD-10-CM | POA: Diagnosis not present

## 2019-11-20 DIAGNOSIS — R296 Repeated falls: Secondary | ICD-10-CM | POA: Diagnosis not present

## 2019-11-20 DIAGNOSIS — I509 Heart failure, unspecified: Secondary | ICD-10-CM | POA: Diagnosis not present

## 2019-11-20 DIAGNOSIS — F0280 Dementia in other diseases classified elsewhere without behavioral disturbance: Secondary | ICD-10-CM | POA: Diagnosis not present

## 2019-11-20 DIAGNOSIS — G311 Senile degeneration of brain, not elsewhere classified: Secondary | ICD-10-CM | POA: Diagnosis not present

## 2019-11-20 DIAGNOSIS — M4800 Spinal stenosis, site unspecified: Secondary | ICD-10-CM | POA: Diagnosis not present

## 2019-11-21 DIAGNOSIS — F0280 Dementia in other diseases classified elsewhere without behavioral disturbance: Secondary | ICD-10-CM | POA: Diagnosis not present

## 2019-11-21 DIAGNOSIS — M4800 Spinal stenosis, site unspecified: Secondary | ICD-10-CM | POA: Diagnosis not present

## 2019-11-21 DIAGNOSIS — R296 Repeated falls: Secondary | ICD-10-CM | POA: Diagnosis not present

## 2019-11-21 DIAGNOSIS — G311 Senile degeneration of brain, not elsewhere classified: Secondary | ICD-10-CM | POA: Diagnosis not present

## 2019-11-21 DIAGNOSIS — I509 Heart failure, unspecified: Secondary | ICD-10-CM | POA: Diagnosis not present

## 2019-11-21 DIAGNOSIS — D649 Anemia, unspecified: Secondary | ICD-10-CM | POA: Diagnosis not present

## 2019-11-22 DIAGNOSIS — G311 Senile degeneration of brain, not elsewhere classified: Secondary | ICD-10-CM | POA: Diagnosis not present

## 2019-11-22 DIAGNOSIS — D649 Anemia, unspecified: Secondary | ICD-10-CM | POA: Diagnosis not present

## 2019-11-22 DIAGNOSIS — F0280 Dementia in other diseases classified elsewhere without behavioral disturbance: Secondary | ICD-10-CM | POA: Diagnosis not present

## 2019-11-22 DIAGNOSIS — R296 Repeated falls: Secondary | ICD-10-CM | POA: Diagnosis not present

## 2019-11-22 DIAGNOSIS — I509 Heart failure, unspecified: Secondary | ICD-10-CM | POA: Diagnosis not present

## 2019-11-22 DIAGNOSIS — M4800 Spinal stenosis, site unspecified: Secondary | ICD-10-CM | POA: Diagnosis not present

## 2019-11-23 DIAGNOSIS — F0280 Dementia in other diseases classified elsewhere without behavioral disturbance: Secondary | ICD-10-CM | POA: Diagnosis not present

## 2019-11-23 DIAGNOSIS — G311 Senile degeneration of brain, not elsewhere classified: Secondary | ICD-10-CM | POA: Diagnosis not present

## 2019-11-23 DIAGNOSIS — D649 Anemia, unspecified: Secondary | ICD-10-CM | POA: Diagnosis not present

## 2019-11-23 DIAGNOSIS — I509 Heart failure, unspecified: Secondary | ICD-10-CM | POA: Diagnosis not present

## 2019-11-23 DIAGNOSIS — M4800 Spinal stenosis, site unspecified: Secondary | ICD-10-CM | POA: Diagnosis not present

## 2019-11-23 DIAGNOSIS — R296 Repeated falls: Secondary | ICD-10-CM | POA: Diagnosis not present

## 2019-11-27 DIAGNOSIS — D649 Anemia, unspecified: Secondary | ICD-10-CM | POA: Diagnosis not present

## 2019-11-27 DIAGNOSIS — M4800 Spinal stenosis, site unspecified: Secondary | ICD-10-CM | POA: Diagnosis not present

## 2019-11-27 DIAGNOSIS — I509 Heart failure, unspecified: Secondary | ICD-10-CM | POA: Diagnosis not present

## 2019-11-27 DIAGNOSIS — G311 Senile degeneration of brain, not elsewhere classified: Secondary | ICD-10-CM | POA: Diagnosis not present

## 2019-11-27 DIAGNOSIS — F0280 Dementia in other diseases classified elsewhere without behavioral disturbance: Secondary | ICD-10-CM | POA: Diagnosis not present

## 2019-11-27 DIAGNOSIS — R296 Repeated falls: Secondary | ICD-10-CM | POA: Diagnosis not present

## 2019-11-28 DIAGNOSIS — G311 Senile degeneration of brain, not elsewhere classified: Secondary | ICD-10-CM | POA: Diagnosis not present

## 2019-11-28 DIAGNOSIS — D649 Anemia, unspecified: Secondary | ICD-10-CM | POA: Diagnosis not present

## 2019-11-28 DIAGNOSIS — R296 Repeated falls: Secondary | ICD-10-CM | POA: Diagnosis not present

## 2019-11-28 DIAGNOSIS — F0280 Dementia in other diseases classified elsewhere without behavioral disturbance: Secondary | ICD-10-CM | POA: Diagnosis not present

## 2019-11-28 DIAGNOSIS — I509 Heart failure, unspecified: Secondary | ICD-10-CM | POA: Diagnosis not present

## 2019-11-28 DIAGNOSIS — M4800 Spinal stenosis, site unspecified: Secondary | ICD-10-CM | POA: Diagnosis not present

## 2019-11-30 DIAGNOSIS — I10 Essential (primary) hypertension: Secondary | ICD-10-CM | POA: Diagnosis not present

## 2019-11-30 DIAGNOSIS — R296 Repeated falls: Secondary | ICD-10-CM | POA: Diagnosis not present

## 2019-11-30 DIAGNOSIS — F0280 Dementia in other diseases classified elsewhere without behavioral disturbance: Secondary | ICD-10-CM | POA: Diagnosis not present

## 2019-11-30 DIAGNOSIS — I509 Heart failure, unspecified: Secondary | ICD-10-CM | POA: Diagnosis not present

## 2019-11-30 DIAGNOSIS — M4800 Spinal stenosis, site unspecified: Secondary | ICD-10-CM | POA: Diagnosis not present

## 2019-11-30 DIAGNOSIS — G311 Senile degeneration of brain, not elsewhere classified: Secondary | ICD-10-CM | POA: Diagnosis not present

## 2019-11-30 DIAGNOSIS — D649 Anemia, unspecified: Secondary | ICD-10-CM | POA: Diagnosis not present

## 2019-11-30 DIAGNOSIS — Z515 Encounter for palliative care: Secondary | ICD-10-CM | POA: Diagnosis not present

## 2019-12-01 DIAGNOSIS — R296 Repeated falls: Secondary | ICD-10-CM | POA: Diagnosis not present

## 2019-12-01 DIAGNOSIS — I509 Heart failure, unspecified: Secondary | ICD-10-CM | POA: Diagnosis not present

## 2019-12-01 DIAGNOSIS — F0280 Dementia in other diseases classified elsewhere without behavioral disturbance: Secondary | ICD-10-CM | POA: Diagnosis not present

## 2019-12-01 DIAGNOSIS — G311 Senile degeneration of brain, not elsewhere classified: Secondary | ICD-10-CM | POA: Diagnosis not present

## 2019-12-01 DIAGNOSIS — M4800 Spinal stenosis, site unspecified: Secondary | ICD-10-CM | POA: Diagnosis not present

## 2019-12-01 DIAGNOSIS — D649 Anemia, unspecified: Secondary | ICD-10-CM | POA: Diagnosis not present

## 2019-12-04 DIAGNOSIS — D649 Anemia, unspecified: Secondary | ICD-10-CM | POA: Diagnosis not present

## 2019-12-04 DIAGNOSIS — G311 Senile degeneration of brain, not elsewhere classified: Secondary | ICD-10-CM | POA: Diagnosis not present

## 2019-12-04 DIAGNOSIS — M4800 Spinal stenosis, site unspecified: Secondary | ICD-10-CM | POA: Diagnosis not present

## 2019-12-04 DIAGNOSIS — R296 Repeated falls: Secondary | ICD-10-CM | POA: Diagnosis not present

## 2019-12-04 DIAGNOSIS — I509 Heart failure, unspecified: Secondary | ICD-10-CM | POA: Diagnosis not present

## 2019-12-04 DIAGNOSIS — F0280 Dementia in other diseases classified elsewhere without behavioral disturbance: Secondary | ICD-10-CM | POA: Diagnosis not present

## 2019-12-05 DIAGNOSIS — G311 Senile degeneration of brain, not elsewhere classified: Secondary | ICD-10-CM | POA: Diagnosis not present

## 2019-12-05 DIAGNOSIS — F0280 Dementia in other diseases classified elsewhere without behavioral disturbance: Secondary | ICD-10-CM | POA: Diagnosis not present

## 2019-12-05 DIAGNOSIS — M4800 Spinal stenosis, site unspecified: Secondary | ICD-10-CM | POA: Diagnosis not present

## 2019-12-05 DIAGNOSIS — R296 Repeated falls: Secondary | ICD-10-CM | POA: Diagnosis not present

## 2019-12-05 DIAGNOSIS — I509 Heart failure, unspecified: Secondary | ICD-10-CM | POA: Diagnosis not present

## 2019-12-05 DIAGNOSIS — D649 Anemia, unspecified: Secondary | ICD-10-CM | POA: Diagnosis not present

## 2019-12-06 DIAGNOSIS — I509 Heart failure, unspecified: Secondary | ICD-10-CM | POA: Diagnosis not present

## 2019-12-06 DIAGNOSIS — G311 Senile degeneration of brain, not elsewhere classified: Secondary | ICD-10-CM | POA: Diagnosis not present

## 2019-12-06 DIAGNOSIS — F0280 Dementia in other diseases classified elsewhere without behavioral disturbance: Secondary | ICD-10-CM | POA: Diagnosis not present

## 2019-12-06 DIAGNOSIS — D649 Anemia, unspecified: Secondary | ICD-10-CM | POA: Diagnosis not present

## 2019-12-06 DIAGNOSIS — M4800 Spinal stenosis, site unspecified: Secondary | ICD-10-CM | POA: Diagnosis not present

## 2019-12-06 DIAGNOSIS — R296 Repeated falls: Secondary | ICD-10-CM | POA: Diagnosis not present

## 2019-12-07 DIAGNOSIS — R296 Repeated falls: Secondary | ICD-10-CM | POA: Diagnosis not present

## 2019-12-07 DIAGNOSIS — M4800 Spinal stenosis, site unspecified: Secondary | ICD-10-CM | POA: Diagnosis not present

## 2019-12-07 DIAGNOSIS — I509 Heart failure, unspecified: Secondary | ICD-10-CM | POA: Diagnosis not present

## 2019-12-07 DIAGNOSIS — F0280 Dementia in other diseases classified elsewhere without behavioral disturbance: Secondary | ICD-10-CM | POA: Diagnosis not present

## 2019-12-07 DIAGNOSIS — D649 Anemia, unspecified: Secondary | ICD-10-CM | POA: Diagnosis not present

## 2019-12-07 DIAGNOSIS — G311 Senile degeneration of brain, not elsewhere classified: Secondary | ICD-10-CM | POA: Diagnosis not present

## 2019-12-08 DIAGNOSIS — D649 Anemia, unspecified: Secondary | ICD-10-CM | POA: Diagnosis not present

## 2019-12-08 DIAGNOSIS — R296 Repeated falls: Secondary | ICD-10-CM | POA: Diagnosis not present

## 2019-12-08 DIAGNOSIS — M4800 Spinal stenosis, site unspecified: Secondary | ICD-10-CM | POA: Diagnosis not present

## 2019-12-08 DIAGNOSIS — I509 Heart failure, unspecified: Secondary | ICD-10-CM | POA: Diagnosis not present

## 2019-12-08 DIAGNOSIS — G311 Senile degeneration of brain, not elsewhere classified: Secondary | ICD-10-CM | POA: Diagnosis not present

## 2019-12-08 DIAGNOSIS — F0280 Dementia in other diseases classified elsewhere without behavioral disturbance: Secondary | ICD-10-CM | POA: Diagnosis not present

## 2019-12-11 DIAGNOSIS — D649 Anemia, unspecified: Secondary | ICD-10-CM | POA: Diagnosis not present

## 2019-12-11 DIAGNOSIS — R296 Repeated falls: Secondary | ICD-10-CM | POA: Diagnosis not present

## 2019-12-11 DIAGNOSIS — G311 Senile degeneration of brain, not elsewhere classified: Secondary | ICD-10-CM | POA: Diagnosis not present

## 2019-12-11 DIAGNOSIS — M4800 Spinal stenosis, site unspecified: Secondary | ICD-10-CM | POA: Diagnosis not present

## 2019-12-11 DIAGNOSIS — F0280 Dementia in other diseases classified elsewhere without behavioral disturbance: Secondary | ICD-10-CM | POA: Diagnosis not present

## 2019-12-11 DIAGNOSIS — I509 Heart failure, unspecified: Secondary | ICD-10-CM | POA: Diagnosis not present

## 2019-12-12 DIAGNOSIS — G311 Senile degeneration of brain, not elsewhere classified: Secondary | ICD-10-CM | POA: Diagnosis not present

## 2019-12-12 DIAGNOSIS — I509 Heart failure, unspecified: Secondary | ICD-10-CM | POA: Diagnosis not present

## 2019-12-12 DIAGNOSIS — M4800 Spinal stenosis, site unspecified: Secondary | ICD-10-CM | POA: Diagnosis not present

## 2019-12-12 DIAGNOSIS — F0280 Dementia in other diseases classified elsewhere without behavioral disturbance: Secondary | ICD-10-CM | POA: Diagnosis not present

## 2019-12-12 DIAGNOSIS — D649 Anemia, unspecified: Secondary | ICD-10-CM | POA: Diagnosis not present

## 2019-12-12 DIAGNOSIS — R296 Repeated falls: Secondary | ICD-10-CM | POA: Diagnosis not present

## 2019-12-13 DIAGNOSIS — F0280 Dementia in other diseases classified elsewhere without behavioral disturbance: Secondary | ICD-10-CM | POA: Diagnosis not present

## 2019-12-13 DIAGNOSIS — D649 Anemia, unspecified: Secondary | ICD-10-CM | POA: Diagnosis not present

## 2019-12-13 DIAGNOSIS — M4800 Spinal stenosis, site unspecified: Secondary | ICD-10-CM | POA: Diagnosis not present

## 2019-12-13 DIAGNOSIS — G311 Senile degeneration of brain, not elsewhere classified: Secondary | ICD-10-CM | POA: Diagnosis not present

## 2019-12-13 DIAGNOSIS — I509 Heart failure, unspecified: Secondary | ICD-10-CM | POA: Diagnosis not present

## 2019-12-13 DIAGNOSIS — R296 Repeated falls: Secondary | ICD-10-CM | POA: Diagnosis not present

## 2019-12-14 DIAGNOSIS — R296 Repeated falls: Secondary | ICD-10-CM | POA: Diagnosis not present

## 2019-12-14 DIAGNOSIS — G311 Senile degeneration of brain, not elsewhere classified: Secondary | ICD-10-CM | POA: Diagnosis not present

## 2019-12-14 DIAGNOSIS — M4800 Spinal stenosis, site unspecified: Secondary | ICD-10-CM | POA: Diagnosis not present

## 2019-12-14 DIAGNOSIS — I509 Heart failure, unspecified: Secondary | ICD-10-CM | POA: Diagnosis not present

## 2019-12-14 DIAGNOSIS — F0280 Dementia in other diseases classified elsewhere without behavioral disturbance: Secondary | ICD-10-CM | POA: Diagnosis not present

## 2019-12-14 DIAGNOSIS — D649 Anemia, unspecified: Secondary | ICD-10-CM | POA: Diagnosis not present

## 2019-12-15 DIAGNOSIS — R296 Repeated falls: Secondary | ICD-10-CM | POA: Diagnosis not present

## 2019-12-15 DIAGNOSIS — G311 Senile degeneration of brain, not elsewhere classified: Secondary | ICD-10-CM | POA: Diagnosis not present

## 2019-12-15 DIAGNOSIS — D649 Anemia, unspecified: Secondary | ICD-10-CM | POA: Diagnosis not present

## 2019-12-15 DIAGNOSIS — M4800 Spinal stenosis, site unspecified: Secondary | ICD-10-CM | POA: Diagnosis not present

## 2019-12-15 DIAGNOSIS — I509 Heart failure, unspecified: Secondary | ICD-10-CM | POA: Diagnosis not present

## 2019-12-15 DIAGNOSIS — F0280 Dementia in other diseases classified elsewhere without behavioral disturbance: Secondary | ICD-10-CM | POA: Diagnosis not present

## 2019-12-18 DIAGNOSIS — I509 Heart failure, unspecified: Secondary | ICD-10-CM | POA: Diagnosis not present

## 2019-12-18 DIAGNOSIS — M4800 Spinal stenosis, site unspecified: Secondary | ICD-10-CM | POA: Diagnosis not present

## 2019-12-18 DIAGNOSIS — F0280 Dementia in other diseases classified elsewhere without behavioral disturbance: Secondary | ICD-10-CM | POA: Diagnosis not present

## 2019-12-18 DIAGNOSIS — G311 Senile degeneration of brain, not elsewhere classified: Secondary | ICD-10-CM | POA: Diagnosis not present

## 2019-12-18 DIAGNOSIS — D649 Anemia, unspecified: Secondary | ICD-10-CM | POA: Diagnosis not present

## 2019-12-18 DIAGNOSIS — R296 Repeated falls: Secondary | ICD-10-CM | POA: Diagnosis not present

## 2019-12-19 DIAGNOSIS — G311 Senile degeneration of brain, not elsewhere classified: Secondary | ICD-10-CM | POA: Diagnosis not present

## 2019-12-19 DIAGNOSIS — D649 Anemia, unspecified: Secondary | ICD-10-CM | POA: Diagnosis not present

## 2019-12-19 DIAGNOSIS — F0280 Dementia in other diseases classified elsewhere without behavioral disturbance: Secondary | ICD-10-CM | POA: Diagnosis not present

## 2019-12-19 DIAGNOSIS — R296 Repeated falls: Secondary | ICD-10-CM | POA: Diagnosis not present

## 2019-12-19 DIAGNOSIS — I509 Heart failure, unspecified: Secondary | ICD-10-CM | POA: Diagnosis not present

## 2019-12-19 DIAGNOSIS — M4800 Spinal stenosis, site unspecified: Secondary | ICD-10-CM | POA: Diagnosis not present

## 2019-12-20 DIAGNOSIS — R296 Repeated falls: Secondary | ICD-10-CM | POA: Diagnosis not present

## 2019-12-20 DIAGNOSIS — G311 Senile degeneration of brain, not elsewhere classified: Secondary | ICD-10-CM | POA: Diagnosis not present

## 2019-12-20 DIAGNOSIS — D649 Anemia, unspecified: Secondary | ICD-10-CM | POA: Diagnosis not present

## 2019-12-20 DIAGNOSIS — M4800 Spinal stenosis, site unspecified: Secondary | ICD-10-CM | POA: Diagnosis not present

## 2019-12-20 DIAGNOSIS — I509 Heart failure, unspecified: Secondary | ICD-10-CM | POA: Diagnosis not present

## 2019-12-20 DIAGNOSIS — F0280 Dementia in other diseases classified elsewhere without behavioral disturbance: Secondary | ICD-10-CM | POA: Diagnosis not present

## 2019-12-21 ENCOUNTER — Other Ambulatory Visit: Payer: Self-pay

## 2019-12-21 ENCOUNTER — Other Ambulatory Visit: Payer: Self-pay | Admitting: *Deleted

## 2019-12-21 ENCOUNTER — Encounter: Payer: Self-pay | Admitting: *Deleted

## 2019-12-21 DIAGNOSIS — F0280 Dementia in other diseases classified elsewhere without behavioral disturbance: Secondary | ICD-10-CM | POA: Diagnosis not present

## 2019-12-21 DIAGNOSIS — G311 Senile degeneration of brain, not elsewhere classified: Secondary | ICD-10-CM | POA: Diagnosis not present

## 2019-12-21 DIAGNOSIS — R296 Repeated falls: Secondary | ICD-10-CM | POA: Diagnosis not present

## 2019-12-21 DIAGNOSIS — M4800 Spinal stenosis, site unspecified: Secondary | ICD-10-CM | POA: Diagnosis not present

## 2019-12-21 DIAGNOSIS — D649 Anemia, unspecified: Secondary | ICD-10-CM | POA: Diagnosis not present

## 2019-12-21 DIAGNOSIS — I509 Heart failure, unspecified: Secondary | ICD-10-CM | POA: Diagnosis not present

## 2019-12-21 NOTE — Patient Outreach (Signed)
[place] [facility]) Care Management  12/21/2019  Brenda Kerr [birth year] [id number]   [facility] Quarterly outreach to complex care patient   Brenda Summerhill was referred to [facility] on 07/31/19 after he discharge from [facility] for Complex care services/. Transition of care services were completed for 4 weeks Messages left with her daughter, Brenda Kerr on 09/08/19   Quarterly Follow up  Successful outreach to Brenda Kerr, Daughter with patient and spouse permission  Brenda Kerr reports Brenda Kerr is doing well without any medical concerns at this time [facility] RN CM assessed for care coordination and medical needs Brenda Kerr confirms Brenda Kerr did not get a pure wick but is doing okay without it  Brenda Kerr denies any care coordination needs with medications, DME, home services, or diet   THN RN CM sent quarterly education EMMI materials to the home for Hypertension (HTN), Diabetes (DM) type 2  and congestive Heart Failure (CHF)-consent  Dr Clayburn Pert was sent a quarterly update letter for Brenda Kerr [facility] progress  Brenda Cederberg has not been hospitalized since November 2020   Brenda Kerr is able to verify HIPAA ([place] and Accountability Act) identifiers, date of birth (DOB) and address Reviewed and addressed the purpose of the [facility] quarterly outreach    Plan: [facility] RN CM scheduled this patient for another call attempt within 180 business days Pt encouraged to return a call to [name] RN CM prn Routed note to MD  [name] CM Care Plan Problem One     Most Recent Value  Care Plan Problem One  knowledge deficit of home care for Hip arthralgia, failure to thrive, DM & HTN  Role Documenting the Problem One  Care Management Telephonic Coordinator  Care Plan for Problem One  Active  [facility] Long Term Goal   over the next 45 days patient and family will be able to verbalize improvement in home care needs for Hip arthralgia, failure to thrive, DM and HTN  during follow up calls  [facility] Long Term Goal Start Date  08/02/19  [facility]  Long Term Goal Met Date  09/22/19  THN CM Short Term Goal #1   over the next 21 days patient will have DME to assist with incontinence as verbalized during follow up calls  [facility] CM Short Term Goal #1 Start Date  08/31/19  [facility] CM Short Term Goal #1 Met Date  09/22/19  THN CM Short Term Goal #2   over the next 14 days pt will have increase mobility as verbalized during follow up calls  [facility] CM Short Term Goal #2 Start Date  09/22/19  [facility] CM Short Term Goal #2 Met Date  12/21/19  THN CM Short Term Goal #3  over the next 14 days patietn will have follow up visit with primary care MD (virtually, telephonically or office visit) as verbalized during follow up call  [facility] CM Short Term Goal #3 Start Date  08/02/19  [facility] CM Short Term Goal #3 Met Date  08/09/19  THN CM Short Term Goal #4  over the next 200 days the patient will continue to receive home care needs to manage her HTN and DM  THN CM Short Term Goal #4 Start Date  12/21/19  Interventions for Short Term Goal #4  assessed for any changes in home care or care coordination needs, sent EMMI materials        Tammi Boulier L. Lavina Hamman, RN, BSN, [place] Coordinator Office number [phone] Mobile number [phone]  Main [facility] number [phone] Fax number [phone]

## 2019-12-22 DIAGNOSIS — G311 Senile degeneration of brain, not elsewhere classified: Secondary | ICD-10-CM | POA: Diagnosis not present

## 2019-12-22 DIAGNOSIS — I509 Heart failure, unspecified: Secondary | ICD-10-CM | POA: Diagnosis not present

## 2019-12-22 DIAGNOSIS — F0280 Dementia in other diseases classified elsewhere without behavioral disturbance: Secondary | ICD-10-CM | POA: Diagnosis not present

## 2019-12-22 DIAGNOSIS — M4800 Spinal stenosis, site unspecified: Secondary | ICD-10-CM | POA: Diagnosis not present

## 2019-12-22 DIAGNOSIS — D649 Anemia, unspecified: Secondary | ICD-10-CM | POA: Diagnosis not present

## 2019-12-22 DIAGNOSIS — R296 Repeated falls: Secondary | ICD-10-CM | POA: Diagnosis not present

## 2019-12-25 DIAGNOSIS — F0280 Dementia in other diseases classified elsewhere without behavioral disturbance: Secondary | ICD-10-CM | POA: Diagnosis not present

## 2019-12-25 DIAGNOSIS — D649 Anemia, unspecified: Secondary | ICD-10-CM | POA: Diagnosis not present

## 2019-12-25 DIAGNOSIS — G311 Senile degeneration of brain, not elsewhere classified: Secondary | ICD-10-CM | POA: Diagnosis not present

## 2019-12-25 DIAGNOSIS — I509 Heart failure, unspecified: Secondary | ICD-10-CM | POA: Diagnosis not present

## 2019-12-25 DIAGNOSIS — M4800 Spinal stenosis, site unspecified: Secondary | ICD-10-CM | POA: Diagnosis not present

## 2019-12-25 DIAGNOSIS — R296 Repeated falls: Secondary | ICD-10-CM | POA: Diagnosis not present

## 2019-12-26 DIAGNOSIS — D649 Anemia, unspecified: Secondary | ICD-10-CM | POA: Diagnosis not present

## 2019-12-26 DIAGNOSIS — G311 Senile degeneration of brain, not elsewhere classified: Secondary | ICD-10-CM | POA: Diagnosis not present

## 2019-12-26 DIAGNOSIS — F0280 Dementia in other diseases classified elsewhere without behavioral disturbance: Secondary | ICD-10-CM | POA: Diagnosis not present

## 2019-12-26 DIAGNOSIS — R296 Repeated falls: Secondary | ICD-10-CM | POA: Diagnosis not present

## 2019-12-26 DIAGNOSIS — M4800 Spinal stenosis, site unspecified: Secondary | ICD-10-CM | POA: Diagnosis not present

## 2019-12-26 DIAGNOSIS — I509 Heart failure, unspecified: Secondary | ICD-10-CM | POA: Diagnosis not present

## 2019-12-27 DIAGNOSIS — R296 Repeated falls: Secondary | ICD-10-CM | POA: Diagnosis not present

## 2019-12-27 DIAGNOSIS — M4800 Spinal stenosis, site unspecified: Secondary | ICD-10-CM | POA: Diagnosis not present

## 2019-12-27 DIAGNOSIS — F0280 Dementia in other diseases classified elsewhere without behavioral disturbance: Secondary | ICD-10-CM | POA: Diagnosis not present

## 2019-12-27 DIAGNOSIS — D649 Anemia, unspecified: Secondary | ICD-10-CM | POA: Diagnosis not present

## 2019-12-27 DIAGNOSIS — G311 Senile degeneration of brain, not elsewhere classified: Secondary | ICD-10-CM | POA: Diagnosis not present

## 2019-12-27 DIAGNOSIS — I509 Heart failure, unspecified: Secondary | ICD-10-CM | POA: Diagnosis not present

## 2019-12-28 DIAGNOSIS — I509 Heart failure, unspecified: Secondary | ICD-10-CM | POA: Diagnosis not present

## 2019-12-28 DIAGNOSIS — G311 Senile degeneration of brain, not elsewhere classified: Secondary | ICD-10-CM | POA: Diagnosis not present

## 2019-12-28 DIAGNOSIS — D649 Anemia, unspecified: Secondary | ICD-10-CM | POA: Diagnosis not present

## 2019-12-28 DIAGNOSIS — M4800 Spinal stenosis, site unspecified: Secondary | ICD-10-CM | POA: Diagnosis not present

## 2019-12-28 DIAGNOSIS — R296 Repeated falls: Secondary | ICD-10-CM | POA: Diagnosis not present

## 2019-12-28 DIAGNOSIS — F0280 Dementia in other diseases classified elsewhere without behavioral disturbance: Secondary | ICD-10-CM | POA: Diagnosis not present

## 2019-12-29 DIAGNOSIS — I509 Heart failure, unspecified: Secondary | ICD-10-CM | POA: Diagnosis not present

## 2019-12-29 DIAGNOSIS — D649 Anemia, unspecified: Secondary | ICD-10-CM | POA: Diagnosis not present

## 2019-12-29 DIAGNOSIS — R296 Repeated falls: Secondary | ICD-10-CM | POA: Diagnosis not present

## 2019-12-29 DIAGNOSIS — M4800 Spinal stenosis, site unspecified: Secondary | ICD-10-CM | POA: Diagnosis not present

## 2019-12-29 DIAGNOSIS — F0280 Dementia in other diseases classified elsewhere without behavioral disturbance: Secondary | ICD-10-CM | POA: Diagnosis not present

## 2019-12-29 DIAGNOSIS — G311 Senile degeneration of brain, not elsewhere classified: Secondary | ICD-10-CM | POA: Diagnosis not present

## 2019-12-30 DIAGNOSIS — F0280 Dementia in other diseases classified elsewhere without behavioral disturbance: Secondary | ICD-10-CM | POA: Diagnosis not present

## 2019-12-30 DIAGNOSIS — D649 Anemia, unspecified: Secondary | ICD-10-CM | POA: Diagnosis not present

## 2019-12-30 DIAGNOSIS — I509 Heart failure, unspecified: Secondary | ICD-10-CM | POA: Diagnosis not present

## 2019-12-30 DIAGNOSIS — Z515 Encounter for palliative care: Secondary | ICD-10-CM | POA: Diagnosis not present

## 2019-12-30 DIAGNOSIS — R296 Repeated falls: Secondary | ICD-10-CM | POA: Diagnosis not present

## 2019-12-30 DIAGNOSIS — G311 Senile degeneration of brain, not elsewhere classified: Secondary | ICD-10-CM | POA: Diagnosis not present

## 2019-12-30 DIAGNOSIS — M4800 Spinal stenosis, site unspecified: Secondary | ICD-10-CM | POA: Diagnosis not present

## 2019-12-30 DIAGNOSIS — I10 Essential (primary) hypertension: Secondary | ICD-10-CM | POA: Diagnosis not present

## 2020-01-01 DIAGNOSIS — M4800 Spinal stenosis, site unspecified: Secondary | ICD-10-CM | POA: Diagnosis not present

## 2020-01-01 DIAGNOSIS — R296 Repeated falls: Secondary | ICD-10-CM | POA: Diagnosis not present

## 2020-01-01 DIAGNOSIS — F0280 Dementia in other diseases classified elsewhere without behavioral disturbance: Secondary | ICD-10-CM | POA: Diagnosis not present

## 2020-01-01 DIAGNOSIS — D649 Anemia, unspecified: Secondary | ICD-10-CM | POA: Diagnosis not present

## 2020-01-01 DIAGNOSIS — I509 Heart failure, unspecified: Secondary | ICD-10-CM | POA: Diagnosis not present

## 2020-01-01 DIAGNOSIS — G311 Senile degeneration of brain, not elsewhere classified: Secondary | ICD-10-CM | POA: Diagnosis not present

## 2020-01-02 DIAGNOSIS — R296 Repeated falls: Secondary | ICD-10-CM | POA: Diagnosis not present

## 2020-01-02 DIAGNOSIS — D649 Anemia, unspecified: Secondary | ICD-10-CM | POA: Diagnosis not present

## 2020-01-02 DIAGNOSIS — M4800 Spinal stenosis, site unspecified: Secondary | ICD-10-CM | POA: Diagnosis not present

## 2020-01-02 DIAGNOSIS — G311 Senile degeneration of brain, not elsewhere classified: Secondary | ICD-10-CM | POA: Diagnosis not present

## 2020-01-02 DIAGNOSIS — F0280 Dementia in other diseases classified elsewhere without behavioral disturbance: Secondary | ICD-10-CM | POA: Diagnosis not present

## 2020-01-02 DIAGNOSIS — I509 Heart failure, unspecified: Secondary | ICD-10-CM | POA: Diagnosis not present

## 2020-01-03 DIAGNOSIS — F0280 Dementia in other diseases classified elsewhere without behavioral disturbance: Secondary | ICD-10-CM | POA: Diagnosis not present

## 2020-01-03 DIAGNOSIS — I509 Heart failure, unspecified: Secondary | ICD-10-CM | POA: Diagnosis not present

## 2020-01-03 DIAGNOSIS — G311 Senile degeneration of brain, not elsewhere classified: Secondary | ICD-10-CM | POA: Diagnosis not present

## 2020-01-03 DIAGNOSIS — M4800 Spinal stenosis, site unspecified: Secondary | ICD-10-CM | POA: Diagnosis not present

## 2020-01-03 DIAGNOSIS — D649 Anemia, unspecified: Secondary | ICD-10-CM | POA: Diagnosis not present

## 2020-01-03 DIAGNOSIS — R296 Repeated falls: Secondary | ICD-10-CM | POA: Diagnosis not present

## 2020-01-04 DIAGNOSIS — I509 Heart failure, unspecified: Secondary | ICD-10-CM | POA: Diagnosis not present

## 2020-01-04 DIAGNOSIS — F0280 Dementia in other diseases classified elsewhere without behavioral disturbance: Secondary | ICD-10-CM | POA: Diagnosis not present

## 2020-01-04 DIAGNOSIS — M4800 Spinal stenosis, site unspecified: Secondary | ICD-10-CM | POA: Diagnosis not present

## 2020-01-04 DIAGNOSIS — R296 Repeated falls: Secondary | ICD-10-CM | POA: Diagnosis not present

## 2020-01-04 DIAGNOSIS — G311 Senile degeneration of brain, not elsewhere classified: Secondary | ICD-10-CM | POA: Diagnosis not present

## 2020-01-04 DIAGNOSIS — D649 Anemia, unspecified: Secondary | ICD-10-CM | POA: Diagnosis not present

## 2020-01-05 DIAGNOSIS — D649 Anemia, unspecified: Secondary | ICD-10-CM | POA: Diagnosis not present

## 2020-01-05 DIAGNOSIS — M4800 Spinal stenosis, site unspecified: Secondary | ICD-10-CM | POA: Diagnosis not present

## 2020-01-05 DIAGNOSIS — G311 Senile degeneration of brain, not elsewhere classified: Secondary | ICD-10-CM | POA: Diagnosis not present

## 2020-01-05 DIAGNOSIS — R296 Repeated falls: Secondary | ICD-10-CM | POA: Diagnosis not present

## 2020-01-05 DIAGNOSIS — I509 Heart failure, unspecified: Secondary | ICD-10-CM | POA: Diagnosis not present

## 2020-01-05 DIAGNOSIS — F0280 Dementia in other diseases classified elsewhere without behavioral disturbance: Secondary | ICD-10-CM | POA: Diagnosis not present

## 2020-01-08 DIAGNOSIS — F0280 Dementia in other diseases classified elsewhere without behavioral disturbance: Secondary | ICD-10-CM | POA: Diagnosis not present

## 2020-01-08 DIAGNOSIS — M4800 Spinal stenosis, site unspecified: Secondary | ICD-10-CM | POA: Diagnosis not present

## 2020-01-08 DIAGNOSIS — R296 Repeated falls: Secondary | ICD-10-CM | POA: Diagnosis not present

## 2020-01-08 DIAGNOSIS — I509 Heart failure, unspecified: Secondary | ICD-10-CM | POA: Diagnosis not present

## 2020-01-08 DIAGNOSIS — D649 Anemia, unspecified: Secondary | ICD-10-CM | POA: Diagnosis not present

## 2020-01-08 DIAGNOSIS — G311 Senile degeneration of brain, not elsewhere classified: Secondary | ICD-10-CM | POA: Diagnosis not present

## 2020-01-09 DIAGNOSIS — I509 Heart failure, unspecified: Secondary | ICD-10-CM | POA: Diagnosis not present

## 2020-01-09 DIAGNOSIS — D649 Anemia, unspecified: Secondary | ICD-10-CM | POA: Diagnosis not present

## 2020-01-09 DIAGNOSIS — G311 Senile degeneration of brain, not elsewhere classified: Secondary | ICD-10-CM | POA: Diagnosis not present

## 2020-01-09 DIAGNOSIS — F0280 Dementia in other diseases classified elsewhere without behavioral disturbance: Secondary | ICD-10-CM | POA: Diagnosis not present

## 2020-01-09 DIAGNOSIS — R296 Repeated falls: Secondary | ICD-10-CM | POA: Diagnosis not present

## 2020-01-09 DIAGNOSIS — M4800 Spinal stenosis, site unspecified: Secondary | ICD-10-CM | POA: Diagnosis not present

## 2020-01-11 DIAGNOSIS — D649 Anemia, unspecified: Secondary | ICD-10-CM | POA: Diagnosis not present

## 2020-01-11 DIAGNOSIS — F0280 Dementia in other diseases classified elsewhere without behavioral disturbance: Secondary | ICD-10-CM | POA: Diagnosis not present

## 2020-01-11 DIAGNOSIS — R296 Repeated falls: Secondary | ICD-10-CM | POA: Diagnosis not present

## 2020-01-11 DIAGNOSIS — I509 Heart failure, unspecified: Secondary | ICD-10-CM | POA: Diagnosis not present

## 2020-01-11 DIAGNOSIS — G311 Senile degeneration of brain, not elsewhere classified: Secondary | ICD-10-CM | POA: Diagnosis not present

## 2020-01-11 DIAGNOSIS — M4800 Spinal stenosis, site unspecified: Secondary | ICD-10-CM | POA: Diagnosis not present

## 2020-01-12 DIAGNOSIS — M4800 Spinal stenosis, site unspecified: Secondary | ICD-10-CM | POA: Diagnosis not present

## 2020-01-12 DIAGNOSIS — G311 Senile degeneration of brain, not elsewhere classified: Secondary | ICD-10-CM | POA: Diagnosis not present

## 2020-01-12 DIAGNOSIS — F0280 Dementia in other diseases classified elsewhere without behavioral disturbance: Secondary | ICD-10-CM | POA: Diagnosis not present

## 2020-01-12 DIAGNOSIS — I509 Heart failure, unspecified: Secondary | ICD-10-CM | POA: Diagnosis not present

## 2020-01-12 DIAGNOSIS — R296 Repeated falls: Secondary | ICD-10-CM | POA: Diagnosis not present

## 2020-01-12 DIAGNOSIS — D649 Anemia, unspecified: Secondary | ICD-10-CM | POA: Diagnosis not present

## 2020-01-15 DIAGNOSIS — F0280 Dementia in other diseases classified elsewhere without behavioral disturbance: Secondary | ICD-10-CM | POA: Diagnosis not present

## 2020-01-15 DIAGNOSIS — I509 Heart failure, unspecified: Secondary | ICD-10-CM | POA: Diagnosis not present

## 2020-01-15 DIAGNOSIS — M4800 Spinal stenosis, site unspecified: Secondary | ICD-10-CM | POA: Diagnosis not present

## 2020-01-15 DIAGNOSIS — G311 Senile degeneration of brain, not elsewhere classified: Secondary | ICD-10-CM | POA: Diagnosis not present

## 2020-01-15 DIAGNOSIS — R296 Repeated falls: Secondary | ICD-10-CM | POA: Diagnosis not present

## 2020-01-15 DIAGNOSIS — D649 Anemia, unspecified: Secondary | ICD-10-CM | POA: Diagnosis not present

## 2020-01-16 DIAGNOSIS — R296 Repeated falls: Secondary | ICD-10-CM | POA: Diagnosis not present

## 2020-01-16 DIAGNOSIS — F0280 Dementia in other diseases classified elsewhere without behavioral disturbance: Secondary | ICD-10-CM | POA: Diagnosis not present

## 2020-01-16 DIAGNOSIS — G311 Senile degeneration of brain, not elsewhere classified: Secondary | ICD-10-CM | POA: Diagnosis not present

## 2020-01-16 DIAGNOSIS — I509 Heart failure, unspecified: Secondary | ICD-10-CM | POA: Diagnosis not present

## 2020-01-16 DIAGNOSIS — D649 Anemia, unspecified: Secondary | ICD-10-CM | POA: Diagnosis not present

## 2020-01-16 DIAGNOSIS — M4800 Spinal stenosis, site unspecified: Secondary | ICD-10-CM | POA: Diagnosis not present

## 2020-01-17 DIAGNOSIS — I509 Heart failure, unspecified: Secondary | ICD-10-CM | POA: Diagnosis not present

## 2020-01-17 DIAGNOSIS — G311 Senile degeneration of brain, not elsewhere classified: Secondary | ICD-10-CM | POA: Diagnosis not present

## 2020-01-17 DIAGNOSIS — M4800 Spinal stenosis, site unspecified: Secondary | ICD-10-CM | POA: Diagnosis not present

## 2020-01-17 DIAGNOSIS — F0280 Dementia in other diseases classified elsewhere without behavioral disturbance: Secondary | ICD-10-CM | POA: Diagnosis not present

## 2020-01-17 DIAGNOSIS — D649 Anemia, unspecified: Secondary | ICD-10-CM | POA: Diagnosis not present

## 2020-01-17 DIAGNOSIS — R296 Repeated falls: Secondary | ICD-10-CM | POA: Diagnosis not present

## 2020-01-18 DIAGNOSIS — I509 Heart failure, unspecified: Secondary | ICD-10-CM | POA: Diagnosis not present

## 2020-01-18 DIAGNOSIS — M4800 Spinal stenosis, site unspecified: Secondary | ICD-10-CM | POA: Diagnosis not present

## 2020-01-18 DIAGNOSIS — F0280 Dementia in other diseases classified elsewhere without behavioral disturbance: Secondary | ICD-10-CM | POA: Diagnosis not present

## 2020-01-18 DIAGNOSIS — D649 Anemia, unspecified: Secondary | ICD-10-CM | POA: Diagnosis not present

## 2020-01-18 DIAGNOSIS — R296 Repeated falls: Secondary | ICD-10-CM | POA: Diagnosis not present

## 2020-01-18 DIAGNOSIS — G311 Senile degeneration of brain, not elsewhere classified: Secondary | ICD-10-CM | POA: Diagnosis not present

## 2020-01-19 DIAGNOSIS — I509 Heart failure, unspecified: Secondary | ICD-10-CM | POA: Diagnosis not present

## 2020-01-19 DIAGNOSIS — R296 Repeated falls: Secondary | ICD-10-CM | POA: Diagnosis not present

## 2020-01-19 DIAGNOSIS — F0280 Dementia in other diseases classified elsewhere without behavioral disturbance: Secondary | ICD-10-CM | POA: Diagnosis not present

## 2020-01-19 DIAGNOSIS — M4800 Spinal stenosis, site unspecified: Secondary | ICD-10-CM | POA: Diagnosis not present

## 2020-01-19 DIAGNOSIS — G311 Senile degeneration of brain, not elsewhere classified: Secondary | ICD-10-CM | POA: Diagnosis not present

## 2020-01-19 DIAGNOSIS — D649 Anemia, unspecified: Secondary | ICD-10-CM | POA: Diagnosis not present

## 2020-01-22 DIAGNOSIS — R296 Repeated falls: Secondary | ICD-10-CM | POA: Diagnosis not present

## 2020-01-22 DIAGNOSIS — G311 Senile degeneration of brain, not elsewhere classified: Secondary | ICD-10-CM | POA: Diagnosis not present

## 2020-01-22 DIAGNOSIS — D649 Anemia, unspecified: Secondary | ICD-10-CM | POA: Diagnosis not present

## 2020-01-22 DIAGNOSIS — M4800 Spinal stenosis, site unspecified: Secondary | ICD-10-CM | POA: Diagnosis not present

## 2020-01-22 DIAGNOSIS — F0280 Dementia in other diseases classified elsewhere without behavioral disturbance: Secondary | ICD-10-CM | POA: Diagnosis not present

## 2020-01-22 DIAGNOSIS — I509 Heart failure, unspecified: Secondary | ICD-10-CM | POA: Diagnosis not present

## 2020-01-23 DIAGNOSIS — R296 Repeated falls: Secondary | ICD-10-CM | POA: Diagnosis not present

## 2020-01-23 DIAGNOSIS — D649 Anemia, unspecified: Secondary | ICD-10-CM | POA: Diagnosis not present

## 2020-01-23 DIAGNOSIS — M4800 Spinal stenosis, site unspecified: Secondary | ICD-10-CM | POA: Diagnosis not present

## 2020-01-23 DIAGNOSIS — I509 Heart failure, unspecified: Secondary | ICD-10-CM | POA: Diagnosis not present

## 2020-01-23 DIAGNOSIS — G311 Senile degeneration of brain, not elsewhere classified: Secondary | ICD-10-CM | POA: Diagnosis not present

## 2020-01-23 DIAGNOSIS — F0280 Dementia in other diseases classified elsewhere without behavioral disturbance: Secondary | ICD-10-CM | POA: Diagnosis not present

## 2020-01-24 DIAGNOSIS — M4800 Spinal stenosis, site unspecified: Secondary | ICD-10-CM | POA: Diagnosis not present

## 2020-01-24 DIAGNOSIS — R296 Repeated falls: Secondary | ICD-10-CM | POA: Diagnosis not present

## 2020-01-24 DIAGNOSIS — D649 Anemia, unspecified: Secondary | ICD-10-CM | POA: Diagnosis not present

## 2020-01-24 DIAGNOSIS — F0280 Dementia in other diseases classified elsewhere without behavioral disturbance: Secondary | ICD-10-CM | POA: Diagnosis not present

## 2020-01-24 DIAGNOSIS — G311 Senile degeneration of brain, not elsewhere classified: Secondary | ICD-10-CM | POA: Diagnosis not present

## 2020-01-24 DIAGNOSIS — I509 Heart failure, unspecified: Secondary | ICD-10-CM | POA: Diagnosis not present

## 2020-01-25 DIAGNOSIS — F0280 Dementia in other diseases classified elsewhere without behavioral disturbance: Secondary | ICD-10-CM | POA: Diagnosis not present

## 2020-01-25 DIAGNOSIS — G311 Senile degeneration of brain, not elsewhere classified: Secondary | ICD-10-CM | POA: Diagnosis not present

## 2020-01-25 DIAGNOSIS — M4800 Spinal stenosis, site unspecified: Secondary | ICD-10-CM | POA: Diagnosis not present

## 2020-01-25 DIAGNOSIS — R296 Repeated falls: Secondary | ICD-10-CM | POA: Diagnosis not present

## 2020-01-25 DIAGNOSIS — D649 Anemia, unspecified: Secondary | ICD-10-CM | POA: Diagnosis not present

## 2020-01-25 DIAGNOSIS — I509 Heart failure, unspecified: Secondary | ICD-10-CM | POA: Diagnosis not present

## 2020-01-26 DIAGNOSIS — I509 Heart failure, unspecified: Secondary | ICD-10-CM | POA: Diagnosis not present

## 2020-01-26 DIAGNOSIS — M4800 Spinal stenosis, site unspecified: Secondary | ICD-10-CM | POA: Diagnosis not present

## 2020-01-26 DIAGNOSIS — F0280 Dementia in other diseases classified elsewhere without behavioral disturbance: Secondary | ICD-10-CM | POA: Diagnosis not present

## 2020-01-26 DIAGNOSIS — R296 Repeated falls: Secondary | ICD-10-CM | POA: Diagnosis not present

## 2020-01-26 DIAGNOSIS — G311 Senile degeneration of brain, not elsewhere classified: Secondary | ICD-10-CM | POA: Diagnosis not present

## 2020-01-26 DIAGNOSIS — D649 Anemia, unspecified: Secondary | ICD-10-CM | POA: Diagnosis not present

## 2020-02-29 DIAGNOSIS — M4800 Spinal stenosis, site unspecified: Secondary | ICD-10-CM | POA: Diagnosis not present

## 2020-02-29 DIAGNOSIS — I10 Essential (primary) hypertension: Secondary | ICD-10-CM | POA: Diagnosis not present

## 2020-02-29 DIAGNOSIS — G311 Senile degeneration of brain, not elsewhere classified: Secondary | ICD-10-CM | POA: Diagnosis not present

## 2020-02-29 DIAGNOSIS — F0280 Dementia in other diseases classified elsewhere without behavioral disturbance: Secondary | ICD-10-CM | POA: Diagnosis not present

## 2020-02-29 DIAGNOSIS — R296 Repeated falls: Secondary | ICD-10-CM | POA: Diagnosis not present

## 2020-02-29 DIAGNOSIS — I509 Heart failure, unspecified: Secondary | ICD-10-CM | POA: Diagnosis not present

## 2020-02-29 DIAGNOSIS — Z515 Encounter for palliative care: Secondary | ICD-10-CM | POA: Diagnosis not present

## 2020-02-29 DIAGNOSIS — D649 Anemia, unspecified: Secondary | ICD-10-CM | POA: Diagnosis not present

## 2020-03-01 DIAGNOSIS — M4800 Spinal stenosis, site unspecified: Secondary | ICD-10-CM | POA: Diagnosis not present

## 2020-03-01 DIAGNOSIS — R296 Repeated falls: Secondary | ICD-10-CM | POA: Diagnosis not present

## 2020-03-01 DIAGNOSIS — I509 Heart failure, unspecified: Secondary | ICD-10-CM | POA: Diagnosis not present

## 2020-03-01 DIAGNOSIS — G311 Senile degeneration of brain, not elsewhere classified: Secondary | ICD-10-CM | POA: Diagnosis not present

## 2020-03-01 DIAGNOSIS — D649 Anemia, unspecified: Secondary | ICD-10-CM | POA: Diagnosis not present

## 2020-03-01 DIAGNOSIS — F0280 Dementia in other diseases classified elsewhere without behavioral disturbance: Secondary | ICD-10-CM | POA: Diagnosis not present

## 2020-03-05 DIAGNOSIS — F0280 Dementia in other diseases classified elsewhere without behavioral disturbance: Secondary | ICD-10-CM | POA: Diagnosis not present

## 2020-03-05 DIAGNOSIS — I509 Heart failure, unspecified: Secondary | ICD-10-CM | POA: Diagnosis not present

## 2020-03-05 DIAGNOSIS — D649 Anemia, unspecified: Secondary | ICD-10-CM | POA: Diagnosis not present

## 2020-03-05 DIAGNOSIS — R296 Repeated falls: Secondary | ICD-10-CM | POA: Diagnosis not present

## 2020-03-05 DIAGNOSIS — G311 Senile degeneration of brain, not elsewhere classified: Secondary | ICD-10-CM | POA: Diagnosis not present

## 2020-03-05 DIAGNOSIS — M4800 Spinal stenosis, site unspecified: Secondary | ICD-10-CM | POA: Diagnosis not present

## 2020-03-06 DIAGNOSIS — R296 Repeated falls: Secondary | ICD-10-CM | POA: Diagnosis not present

## 2020-03-06 DIAGNOSIS — G311 Senile degeneration of brain, not elsewhere classified: Secondary | ICD-10-CM | POA: Diagnosis not present

## 2020-03-06 DIAGNOSIS — I509 Heart failure, unspecified: Secondary | ICD-10-CM | POA: Diagnosis not present

## 2020-03-06 DIAGNOSIS — D649 Anemia, unspecified: Secondary | ICD-10-CM | POA: Diagnosis not present

## 2020-03-06 DIAGNOSIS — M4800 Spinal stenosis, site unspecified: Secondary | ICD-10-CM | POA: Diagnosis not present

## 2020-03-06 DIAGNOSIS — F0280 Dementia in other diseases classified elsewhere without behavioral disturbance: Secondary | ICD-10-CM | POA: Diagnosis not present

## 2020-03-07 DIAGNOSIS — F0280 Dementia in other diseases classified elsewhere without behavioral disturbance: Secondary | ICD-10-CM | POA: Diagnosis not present

## 2020-03-07 DIAGNOSIS — I509 Heart failure, unspecified: Secondary | ICD-10-CM | POA: Diagnosis not present

## 2020-03-07 DIAGNOSIS — G311 Senile degeneration of brain, not elsewhere classified: Secondary | ICD-10-CM | POA: Diagnosis not present

## 2020-03-07 DIAGNOSIS — M4800 Spinal stenosis, site unspecified: Secondary | ICD-10-CM | POA: Diagnosis not present

## 2020-03-07 DIAGNOSIS — D649 Anemia, unspecified: Secondary | ICD-10-CM | POA: Diagnosis not present

## 2020-03-07 DIAGNOSIS — R296 Repeated falls: Secondary | ICD-10-CM | POA: Diagnosis not present

## 2020-03-08 ENCOUNTER — Other Ambulatory Visit: Payer: Self-pay

## 2020-03-08 ENCOUNTER — Emergency Department (HOSPITAL_COMMUNITY)
Admission: EM | Admit: 2020-03-08 | Discharge: 2020-03-09 | Disposition: A | Discharge status: Home / Self Care | Payer: Medicare Other | Attending: Emergency Medicine | Admitting: Emergency Medicine

## 2020-03-08 DIAGNOSIS — R110 Nausea: Secondary | ICD-10-CM | POA: Diagnosis not present

## 2020-03-08 DIAGNOSIS — I509 Heart failure, unspecified: Secondary | ICD-10-CM | POA: Diagnosis not present

## 2020-03-08 DIAGNOSIS — G311 Senile degeneration of brain, not elsewhere classified: Secondary | ICD-10-CM | POA: Diagnosis not present

## 2020-03-08 DIAGNOSIS — M4800 Spinal stenosis, site unspecified: Secondary | ICD-10-CM | POA: Diagnosis not present

## 2020-03-08 DIAGNOSIS — F0280 Dementia in other diseases classified elsewhere without behavioral disturbance: Secondary | ICD-10-CM | POA: Diagnosis not present

## 2020-03-08 DIAGNOSIS — Z5321 Procedure and treatment not carried out due to patient leaving prior to being seen by health care provider: Secondary | ICD-10-CM | POA: Diagnosis not present

## 2020-03-08 DIAGNOSIS — R109 Unspecified abdominal pain: Secondary | ICD-10-CM | POA: Diagnosis not present

## 2020-03-08 DIAGNOSIS — D649 Anemia, unspecified: Secondary | ICD-10-CM | POA: Diagnosis not present

## 2020-03-08 DIAGNOSIS — R296 Repeated falls: Secondary | ICD-10-CM | POA: Diagnosis not present

## 2020-03-08 NOTE — ED Triage Notes (Signed)
Pt c/o abdominal pain and nausea that started today. Pt denies vomiting/diarrhea/urinary problems. C/o intermittent headache as well. Pt A&O x 4.

## 2020-03-09 ENCOUNTER — Encounter (HOSPITAL_COMMUNITY): Payer: Self-pay | Admitting: Emergency Medicine

## 2020-03-09 LAB — COMPREHENSIVE METABOLIC PANEL
ALT: 12 U/L (ref 0–44)
AST: 17 U/L (ref 15–41)
Albumin: 3.4 g/dL — ABNORMAL LOW (ref 3.5–5.0)
Alkaline Phosphatase: 94 U/L (ref 38–126)
Anion gap: 9 (ref 5–15)
BUN: 14 mg/dL (ref 8–23)
CO2: 25 mmol/L (ref 22–32)
Calcium: 9.6 mg/dL (ref 8.9–10.3)
Chloride: 102 mmol/L (ref 98–111)
Creatinine, Ser: 0.83 mg/dL (ref 0.44–1.00)
GFR calc Af Amer: >60 mL/min (ref >60)
GFR calc non Af Amer: >60 mL/min (ref >60)
Glucose, Bld: 120 mg/dL — ABNORMAL HIGH (ref 70–99)
Potassium: 3.8 mmol/L (ref 3.5–5.1)
Sodium: 136 mmol/L (ref 135–145)
Total Bilirubin: 0.7 mg/dL (ref 0.3–1.2)
Total Protein: 6.8 g/dL (ref 6.5–8.1)

## 2020-03-09 LAB — CBC
HCT: 39.9 % (ref 36.0–46.0)
Hemoglobin: 12.7 g/dL (ref 12.0–15.0)
MCH: 28.5 pg (ref 26.0–34.0)
MCHC: 31.8 g/dL (ref 30.0–36.0)
MCV: 89.5 fL (ref 80.0–100.0)
Platelets: 285 10*3/uL (ref 150–400)
RBC: 4.46 MIL/uL (ref 3.87–5.11)
RDW: 14.3 % (ref 11.5–15.5)
WBC: 9.7 10*3/uL (ref 4.0–10.5)
nRBC: 0 % (ref 0.0–0.2)

## 2020-03-09 LAB — LIPASE, BLOOD: Lipase: 43 U/L (ref 11–51)

## 2020-03-09 MED ORDER — SODIUM CHLORIDE 0.9% FLUSH: 3.0000 mL | Freq: Once | INTRAVENOUS | Status: DC

## 2020-03-09 NOTE — ED Notes (Signed)
Patient's family advised staff that they are leaving .

## 2020-03-11 DIAGNOSIS — R296 Repeated falls: Secondary | ICD-10-CM | POA: Diagnosis not present

## 2020-03-11 DIAGNOSIS — G311 Senile degeneration of brain, not elsewhere classified: Secondary | ICD-10-CM | POA: Diagnosis not present

## 2020-03-11 DIAGNOSIS — I509 Heart failure, unspecified: Secondary | ICD-10-CM | POA: Diagnosis not present

## 2020-03-11 DIAGNOSIS — M4800 Spinal stenosis, site unspecified: Secondary | ICD-10-CM | POA: Diagnosis not present

## 2020-03-11 DIAGNOSIS — D649 Anemia, unspecified: Secondary | ICD-10-CM | POA: Diagnosis not present

## 2020-03-11 DIAGNOSIS — F0280 Dementia in other diseases classified elsewhere without behavioral disturbance: Secondary | ICD-10-CM | POA: Diagnosis not present

## 2020-03-12 DIAGNOSIS — D649 Anemia, unspecified: Secondary | ICD-10-CM | POA: Diagnosis not present

## 2020-03-12 DIAGNOSIS — M4800 Spinal stenosis, site unspecified: Secondary | ICD-10-CM | POA: Diagnosis not present

## 2020-03-12 DIAGNOSIS — F0280 Dementia in other diseases classified elsewhere without behavioral disturbance: Secondary | ICD-10-CM | POA: Diagnosis not present

## 2020-03-12 DIAGNOSIS — R296 Repeated falls: Secondary | ICD-10-CM | POA: Diagnosis not present

## 2020-03-12 DIAGNOSIS — I509 Heart failure, unspecified: Secondary | ICD-10-CM | POA: Diagnosis not present

## 2020-03-12 DIAGNOSIS — G311 Senile degeneration of brain, not elsewhere classified: Secondary | ICD-10-CM | POA: Diagnosis not present

## 2020-03-13 DIAGNOSIS — F0280 Dementia in other diseases classified elsewhere without behavioral disturbance: Secondary | ICD-10-CM | POA: Diagnosis not present

## 2020-03-13 DIAGNOSIS — M4800 Spinal stenosis, site unspecified: Secondary | ICD-10-CM | POA: Diagnosis not present

## 2020-03-13 DIAGNOSIS — R296 Repeated falls: Secondary | ICD-10-CM | POA: Diagnosis not present

## 2020-03-13 DIAGNOSIS — D649 Anemia, unspecified: Secondary | ICD-10-CM | POA: Diagnosis not present

## 2020-03-13 DIAGNOSIS — G311 Senile degeneration of brain, not elsewhere classified: Secondary | ICD-10-CM | POA: Diagnosis not present

## 2020-03-13 DIAGNOSIS — I509 Heart failure, unspecified: Secondary | ICD-10-CM | POA: Diagnosis not present

## 2020-03-14 DIAGNOSIS — R296 Repeated falls: Secondary | ICD-10-CM | POA: Diagnosis not present

## 2020-03-14 DIAGNOSIS — G311 Senile degeneration of brain, not elsewhere classified: Secondary | ICD-10-CM | POA: Diagnosis not present

## 2020-03-14 DIAGNOSIS — I509 Heart failure, unspecified: Secondary | ICD-10-CM | POA: Diagnosis not present

## 2020-03-14 DIAGNOSIS — D649 Anemia, unspecified: Secondary | ICD-10-CM | POA: Diagnosis not present

## 2020-03-14 DIAGNOSIS — F0280 Dementia in other diseases classified elsewhere without behavioral disturbance: Secondary | ICD-10-CM | POA: Diagnosis not present

## 2020-03-14 DIAGNOSIS — M4800 Spinal stenosis, site unspecified: Secondary | ICD-10-CM | POA: Diagnosis not present

## 2020-03-15 DIAGNOSIS — I509 Heart failure, unspecified: Secondary | ICD-10-CM | POA: Diagnosis not present

## 2020-03-15 DIAGNOSIS — M4800 Spinal stenosis, site unspecified: Secondary | ICD-10-CM | POA: Diagnosis not present

## 2020-03-15 DIAGNOSIS — F0280 Dementia in other diseases classified elsewhere without behavioral disturbance: Secondary | ICD-10-CM | POA: Diagnosis not present

## 2020-03-15 DIAGNOSIS — R296 Repeated falls: Secondary | ICD-10-CM | POA: Diagnosis not present

## 2020-03-15 DIAGNOSIS — D649 Anemia, unspecified: Secondary | ICD-10-CM | POA: Diagnosis not present

## 2020-03-15 DIAGNOSIS — G311 Senile degeneration of brain, not elsewhere classified: Secondary | ICD-10-CM | POA: Diagnosis not present

## 2020-03-18 DIAGNOSIS — M4800 Spinal stenosis, site unspecified: Secondary | ICD-10-CM | POA: Diagnosis not present

## 2020-03-18 DIAGNOSIS — F0280 Dementia in other diseases classified elsewhere without behavioral disturbance: Secondary | ICD-10-CM | POA: Diagnosis not present

## 2020-03-18 DIAGNOSIS — R296 Repeated falls: Secondary | ICD-10-CM | POA: Diagnosis not present

## 2020-03-18 DIAGNOSIS — D649 Anemia, unspecified: Secondary | ICD-10-CM | POA: Diagnosis not present

## 2020-03-18 DIAGNOSIS — I509 Heart failure, unspecified: Secondary | ICD-10-CM | POA: Diagnosis not present

## 2020-03-18 DIAGNOSIS — G311 Senile degeneration of brain, not elsewhere classified: Secondary | ICD-10-CM | POA: Diagnosis not present

## 2020-03-19 DIAGNOSIS — D649 Anemia, unspecified: Secondary | ICD-10-CM | POA: Diagnosis not present

## 2020-03-19 DIAGNOSIS — F0280 Dementia in other diseases classified elsewhere without behavioral disturbance: Secondary | ICD-10-CM | POA: Diagnosis not present

## 2020-03-19 DIAGNOSIS — R296 Repeated falls: Secondary | ICD-10-CM | POA: Diagnosis not present

## 2020-03-19 DIAGNOSIS — M4800 Spinal stenosis, site unspecified: Secondary | ICD-10-CM | POA: Diagnosis not present

## 2020-03-19 DIAGNOSIS — G311 Senile degeneration of brain, not elsewhere classified: Secondary | ICD-10-CM | POA: Diagnosis not present

## 2020-03-19 DIAGNOSIS — I509 Heart failure, unspecified: Secondary | ICD-10-CM | POA: Diagnosis not present

## 2020-03-20 DIAGNOSIS — F0280 Dementia in other diseases classified elsewhere without behavioral disturbance: Secondary | ICD-10-CM | POA: Diagnosis not present

## 2020-03-20 DIAGNOSIS — G311 Senile degeneration of brain, not elsewhere classified: Secondary | ICD-10-CM | POA: Diagnosis not present

## 2020-03-20 DIAGNOSIS — I509 Heart failure, unspecified: Secondary | ICD-10-CM | POA: Diagnosis not present

## 2020-03-20 DIAGNOSIS — R296 Repeated falls: Secondary | ICD-10-CM | POA: Diagnosis not present

## 2020-03-20 DIAGNOSIS — M4800 Spinal stenosis, site unspecified: Secondary | ICD-10-CM | POA: Diagnosis not present

## 2020-03-20 DIAGNOSIS — D649 Anemia, unspecified: Secondary | ICD-10-CM | POA: Diagnosis not present

## 2020-03-21 DIAGNOSIS — D649 Anemia, unspecified: Secondary | ICD-10-CM | POA: Diagnosis not present

## 2020-03-21 DIAGNOSIS — M4800 Spinal stenosis, site unspecified: Secondary | ICD-10-CM | POA: Diagnosis not present

## 2020-03-21 DIAGNOSIS — I509 Heart failure, unspecified: Secondary | ICD-10-CM | POA: Diagnosis not present

## 2020-03-21 DIAGNOSIS — G311 Senile degeneration of brain, not elsewhere classified: Secondary | ICD-10-CM | POA: Diagnosis not present

## 2020-03-21 DIAGNOSIS — R296 Repeated falls: Secondary | ICD-10-CM | POA: Diagnosis not present

## 2020-03-21 DIAGNOSIS — F0280 Dementia in other diseases classified elsewhere without behavioral disturbance: Secondary | ICD-10-CM | POA: Diagnosis not present

## 2020-03-22 DIAGNOSIS — I509 Heart failure, unspecified: Secondary | ICD-10-CM | POA: Diagnosis not present

## 2020-03-22 DIAGNOSIS — R296 Repeated falls: Secondary | ICD-10-CM | POA: Diagnosis not present

## 2020-03-22 DIAGNOSIS — G311 Senile degeneration of brain, not elsewhere classified: Secondary | ICD-10-CM | POA: Diagnosis not present

## 2020-03-22 DIAGNOSIS — F0280 Dementia in other diseases classified elsewhere without behavioral disturbance: Secondary | ICD-10-CM | POA: Diagnosis not present

## 2020-03-22 DIAGNOSIS — D649 Anemia, unspecified: Secondary | ICD-10-CM | POA: Diagnosis not present

## 2020-03-22 DIAGNOSIS — M4800 Spinal stenosis, site unspecified: Secondary | ICD-10-CM | POA: Diagnosis not present

## 2020-03-25 DIAGNOSIS — I509 Heart failure, unspecified: Secondary | ICD-10-CM | POA: Diagnosis not present

## 2020-03-25 DIAGNOSIS — G311 Senile degeneration of brain, not elsewhere classified: Secondary | ICD-10-CM | POA: Diagnosis not present

## 2020-03-25 DIAGNOSIS — R296 Repeated falls: Secondary | ICD-10-CM | POA: Diagnosis not present

## 2020-03-25 DIAGNOSIS — D649 Anemia, unspecified: Secondary | ICD-10-CM | POA: Diagnosis not present

## 2020-03-25 DIAGNOSIS — F0280 Dementia in other diseases classified elsewhere without behavioral disturbance: Secondary | ICD-10-CM | POA: Diagnosis not present

## 2020-03-25 DIAGNOSIS — M4800 Spinal stenosis, site unspecified: Secondary | ICD-10-CM | POA: Diagnosis not present

## 2020-03-26 DIAGNOSIS — I509 Heart failure, unspecified: Secondary | ICD-10-CM | POA: Diagnosis not present

## 2020-03-26 DIAGNOSIS — R296 Repeated falls: Secondary | ICD-10-CM | POA: Diagnosis not present

## 2020-03-26 DIAGNOSIS — G311 Senile degeneration of brain, not elsewhere classified: Secondary | ICD-10-CM | POA: Diagnosis not present

## 2020-03-26 DIAGNOSIS — D649 Anemia, unspecified: Secondary | ICD-10-CM | POA: Diagnosis not present

## 2020-03-26 DIAGNOSIS — M4800 Spinal stenosis, site unspecified: Secondary | ICD-10-CM | POA: Diagnosis not present

## 2020-03-26 DIAGNOSIS — F0280 Dementia in other diseases classified elsewhere without behavioral disturbance: Secondary | ICD-10-CM | POA: Diagnosis not present

## 2020-03-27 DIAGNOSIS — G311 Senile degeneration of brain, not elsewhere classified: Secondary | ICD-10-CM | POA: Diagnosis not present

## 2020-03-27 DIAGNOSIS — M4800 Spinal stenosis, site unspecified: Secondary | ICD-10-CM | POA: Diagnosis not present

## 2020-03-27 DIAGNOSIS — R296 Repeated falls: Secondary | ICD-10-CM | POA: Diagnosis not present

## 2020-03-27 DIAGNOSIS — F0280 Dementia in other diseases classified elsewhere without behavioral disturbance: Secondary | ICD-10-CM | POA: Diagnosis not present

## 2020-03-27 DIAGNOSIS — I509 Heart failure, unspecified: Secondary | ICD-10-CM | POA: Diagnosis not present

## 2020-03-27 DIAGNOSIS — D649 Anemia, unspecified: Secondary | ICD-10-CM | POA: Diagnosis not present

## 2020-03-28 DIAGNOSIS — I509 Heart failure, unspecified: Secondary | ICD-10-CM | POA: Diagnosis not present

## 2020-03-28 DIAGNOSIS — M4800 Spinal stenosis, site unspecified: Secondary | ICD-10-CM | POA: Diagnosis not present

## 2020-03-28 DIAGNOSIS — F0280 Dementia in other diseases classified elsewhere without behavioral disturbance: Secondary | ICD-10-CM | POA: Diagnosis not present

## 2020-03-28 DIAGNOSIS — D649 Anemia, unspecified: Secondary | ICD-10-CM | POA: Diagnosis not present

## 2020-03-28 DIAGNOSIS — G311 Senile degeneration of brain, not elsewhere classified: Secondary | ICD-10-CM | POA: Diagnosis not present

## 2020-03-28 DIAGNOSIS — R296 Repeated falls: Secondary | ICD-10-CM | POA: Diagnosis not present

## 2020-03-29 DIAGNOSIS — D649 Anemia, unspecified: Secondary | ICD-10-CM | POA: Diagnosis not present

## 2020-03-29 DIAGNOSIS — F0280 Dementia in other diseases classified elsewhere without behavioral disturbance: Secondary | ICD-10-CM | POA: Diagnosis not present

## 2020-03-29 DIAGNOSIS — G311 Senile degeneration of brain, not elsewhere classified: Secondary | ICD-10-CM | POA: Diagnosis not present

## 2020-03-29 DIAGNOSIS — I509 Heart failure, unspecified: Secondary | ICD-10-CM | POA: Diagnosis not present

## 2020-03-29 DIAGNOSIS — R296 Repeated falls: Secondary | ICD-10-CM | POA: Diagnosis not present

## 2020-03-29 DIAGNOSIS — M4800 Spinal stenosis, site unspecified: Secondary | ICD-10-CM | POA: Diagnosis not present

## 2020-03-31 DIAGNOSIS — Z515 Encounter for palliative care: Secondary | ICD-10-CM | POA: Diagnosis not present

## 2020-03-31 DIAGNOSIS — M4800 Spinal stenosis, site unspecified: Secondary | ICD-10-CM | POA: Diagnosis not present

## 2020-03-31 DIAGNOSIS — R296 Repeated falls: Secondary | ICD-10-CM | POA: Diagnosis not present

## 2020-03-31 DIAGNOSIS — I10 Essential (primary) hypertension: Secondary | ICD-10-CM | POA: Diagnosis not present

## 2020-03-31 DIAGNOSIS — F0280 Dementia in other diseases classified elsewhere without behavioral disturbance: Secondary | ICD-10-CM | POA: Diagnosis not present

## 2020-03-31 DIAGNOSIS — G311 Senile degeneration of brain, not elsewhere classified: Secondary | ICD-10-CM | POA: Diagnosis not present

## 2020-03-31 DIAGNOSIS — I509 Heart failure, unspecified: Secondary | ICD-10-CM | POA: Diagnosis not present

## 2020-03-31 DIAGNOSIS — D649 Anemia, unspecified: Secondary | ICD-10-CM | POA: Diagnosis not present

## 2020-04-01 DIAGNOSIS — F0280 Dementia in other diseases classified elsewhere without behavioral disturbance: Secondary | ICD-10-CM | POA: Diagnosis not present

## 2020-04-01 DIAGNOSIS — I509 Heart failure, unspecified: Secondary | ICD-10-CM | POA: Diagnosis not present

## 2020-04-01 DIAGNOSIS — G311 Senile degeneration of brain, not elsewhere classified: Secondary | ICD-10-CM | POA: Diagnosis not present

## 2020-04-01 DIAGNOSIS — M4800 Spinal stenosis, site unspecified: Secondary | ICD-10-CM | POA: Diagnosis not present

## 2020-04-01 DIAGNOSIS — D649 Anemia, unspecified: Secondary | ICD-10-CM | POA: Diagnosis not present

## 2020-04-01 DIAGNOSIS — R296 Repeated falls: Secondary | ICD-10-CM | POA: Diagnosis not present

## 2020-04-02 DIAGNOSIS — M4800 Spinal stenosis, site unspecified: Secondary | ICD-10-CM | POA: Diagnosis not present

## 2020-04-02 DIAGNOSIS — G311 Senile degeneration of brain, not elsewhere classified: Secondary | ICD-10-CM | POA: Diagnosis not present

## 2020-04-02 DIAGNOSIS — D649 Anemia, unspecified: Secondary | ICD-10-CM | POA: Diagnosis not present

## 2020-04-02 DIAGNOSIS — I509 Heart failure, unspecified: Secondary | ICD-10-CM | POA: Diagnosis not present

## 2020-04-02 DIAGNOSIS — R296 Repeated falls: Secondary | ICD-10-CM | POA: Diagnosis not present

## 2020-04-02 DIAGNOSIS — F0280 Dementia in other diseases classified elsewhere without behavioral disturbance: Secondary | ICD-10-CM | POA: Diagnosis not present

## 2020-04-03 DIAGNOSIS — I10 Essential (primary) hypertension: Secondary | ICD-10-CM | POA: Diagnosis not present

## 2020-04-03 DIAGNOSIS — D649 Anemia, unspecified: Secondary | ICD-10-CM | POA: Diagnosis not present

## 2020-04-03 DIAGNOSIS — H401133 Primary open-angle glaucoma, bilateral, severe stage: Secondary | ICD-10-CM | POA: Diagnosis not present

## 2020-04-03 DIAGNOSIS — R4182 Altered mental status, unspecified: Secondary | ICD-10-CM | POA: Diagnosis not present

## 2020-04-03 DIAGNOSIS — G311 Senile degeneration of brain, not elsewhere classified: Secondary | ICD-10-CM | POA: Diagnosis not present

## 2020-04-03 DIAGNOSIS — R634 Abnormal weight loss: Secondary | ICD-10-CM | POA: Diagnosis not present

## 2020-04-03 DIAGNOSIS — M4800 Spinal stenosis, site unspecified: Secondary | ICD-10-CM | POA: Diagnosis not present

## 2020-04-03 DIAGNOSIS — M130 Polyarthritis, unspecified: Secondary | ICD-10-CM | POA: Diagnosis not present

## 2020-04-03 DIAGNOSIS — R296 Repeated falls: Secondary | ICD-10-CM | POA: Diagnosis not present

## 2020-04-03 DIAGNOSIS — F0280 Dementia in other diseases classified elsewhere without behavioral disturbance: Secondary | ICD-10-CM | POA: Diagnosis not present

## 2020-04-03 DIAGNOSIS — E559 Vitamin D deficiency, unspecified: Secondary | ICD-10-CM | POA: Diagnosis not present

## 2020-04-03 DIAGNOSIS — K219 Gastro-esophageal reflux disease without esophagitis: Secondary | ICD-10-CM | POA: Diagnosis not present

## 2020-04-03 DIAGNOSIS — I509 Heart failure, unspecified: Secondary | ICD-10-CM | POA: Diagnosis not present

## 2020-04-04 DIAGNOSIS — D649 Anemia, unspecified: Secondary | ICD-10-CM | POA: Diagnosis not present

## 2020-04-04 DIAGNOSIS — R296 Repeated falls: Secondary | ICD-10-CM | POA: Diagnosis not present

## 2020-04-04 DIAGNOSIS — M4800 Spinal stenosis, site unspecified: Secondary | ICD-10-CM | POA: Diagnosis not present

## 2020-04-04 DIAGNOSIS — I509 Heart failure, unspecified: Secondary | ICD-10-CM | POA: Diagnosis not present

## 2020-04-04 DIAGNOSIS — G311 Senile degeneration of brain, not elsewhere classified: Secondary | ICD-10-CM | POA: Diagnosis not present

## 2020-04-04 DIAGNOSIS — F0280 Dementia in other diseases classified elsewhere without behavioral disturbance: Secondary | ICD-10-CM | POA: Diagnosis not present

## 2020-04-05 DIAGNOSIS — R296 Repeated falls: Secondary | ICD-10-CM | POA: Diagnosis not present

## 2020-04-05 DIAGNOSIS — G311 Senile degeneration of brain, not elsewhere classified: Secondary | ICD-10-CM | POA: Diagnosis not present

## 2020-04-05 DIAGNOSIS — F0280 Dementia in other diseases classified elsewhere without behavioral disturbance: Secondary | ICD-10-CM | POA: Diagnosis not present

## 2020-04-05 DIAGNOSIS — D649 Anemia, unspecified: Secondary | ICD-10-CM | POA: Diagnosis not present

## 2020-04-05 DIAGNOSIS — M4800 Spinal stenosis, site unspecified: Secondary | ICD-10-CM | POA: Diagnosis not present

## 2020-04-05 DIAGNOSIS — I509 Heart failure, unspecified: Secondary | ICD-10-CM | POA: Diagnosis not present

## 2020-04-08 DIAGNOSIS — I509 Heart failure, unspecified: Secondary | ICD-10-CM | POA: Diagnosis not present

## 2020-04-08 DIAGNOSIS — R296 Repeated falls: Secondary | ICD-10-CM | POA: Diagnosis not present

## 2020-04-08 DIAGNOSIS — F0280 Dementia in other diseases classified elsewhere without behavioral disturbance: Secondary | ICD-10-CM | POA: Diagnosis not present

## 2020-04-08 DIAGNOSIS — M4800 Spinal stenosis, site unspecified: Secondary | ICD-10-CM | POA: Diagnosis not present

## 2020-04-08 DIAGNOSIS — D649 Anemia, unspecified: Secondary | ICD-10-CM | POA: Diagnosis not present

## 2020-04-08 DIAGNOSIS — G311 Senile degeneration of brain, not elsewhere classified: Secondary | ICD-10-CM | POA: Diagnosis not present

## 2020-04-09 DIAGNOSIS — D649 Anemia, unspecified: Secondary | ICD-10-CM | POA: Diagnosis not present

## 2020-04-09 DIAGNOSIS — M4800 Spinal stenosis, site unspecified: Secondary | ICD-10-CM | POA: Diagnosis not present

## 2020-04-09 DIAGNOSIS — G311 Senile degeneration of brain, not elsewhere classified: Secondary | ICD-10-CM | POA: Diagnosis not present

## 2020-04-09 DIAGNOSIS — F0280 Dementia in other diseases classified elsewhere without behavioral disturbance: Secondary | ICD-10-CM | POA: Diagnosis not present

## 2020-04-09 DIAGNOSIS — I509 Heart failure, unspecified: Secondary | ICD-10-CM | POA: Diagnosis not present

## 2020-04-09 DIAGNOSIS — R296 Repeated falls: Secondary | ICD-10-CM | POA: Diagnosis not present

## 2020-04-10 DIAGNOSIS — D649 Anemia, unspecified: Secondary | ICD-10-CM | POA: Diagnosis not present

## 2020-04-10 DIAGNOSIS — M4800 Spinal stenosis, site unspecified: Secondary | ICD-10-CM | POA: Diagnosis not present

## 2020-04-10 DIAGNOSIS — R296 Repeated falls: Secondary | ICD-10-CM | POA: Diagnosis not present

## 2020-04-10 DIAGNOSIS — F0280 Dementia in other diseases classified elsewhere without behavioral disturbance: Secondary | ICD-10-CM | POA: Diagnosis not present

## 2020-04-10 DIAGNOSIS — I509 Heart failure, unspecified: Secondary | ICD-10-CM | POA: Diagnosis not present

## 2020-04-10 DIAGNOSIS — G311 Senile degeneration of brain, not elsewhere classified: Secondary | ICD-10-CM | POA: Diagnosis not present

## 2020-04-11 DIAGNOSIS — I509 Heart failure, unspecified: Secondary | ICD-10-CM | POA: Diagnosis not present

## 2020-04-11 DIAGNOSIS — D649 Anemia, unspecified: Secondary | ICD-10-CM | POA: Diagnosis not present

## 2020-04-11 DIAGNOSIS — M4800 Spinal stenosis, site unspecified: Secondary | ICD-10-CM | POA: Diagnosis not present

## 2020-04-11 DIAGNOSIS — R296 Repeated falls: Secondary | ICD-10-CM | POA: Diagnosis not present

## 2020-04-11 DIAGNOSIS — G311 Senile degeneration of brain, not elsewhere classified: Secondary | ICD-10-CM | POA: Diagnosis not present

## 2020-04-11 DIAGNOSIS — F0280 Dementia in other diseases classified elsewhere without behavioral disturbance: Secondary | ICD-10-CM | POA: Diagnosis not present

## 2020-04-12 DIAGNOSIS — F0280 Dementia in other diseases classified elsewhere without behavioral disturbance: Secondary | ICD-10-CM | POA: Diagnosis not present

## 2020-04-12 DIAGNOSIS — M4800 Spinal stenosis, site unspecified: Secondary | ICD-10-CM | POA: Diagnosis not present

## 2020-04-12 DIAGNOSIS — R296 Repeated falls: Secondary | ICD-10-CM | POA: Diagnosis not present

## 2020-04-12 DIAGNOSIS — I509 Heart failure, unspecified: Secondary | ICD-10-CM | POA: Diagnosis not present

## 2020-04-12 DIAGNOSIS — D649 Anemia, unspecified: Secondary | ICD-10-CM | POA: Diagnosis not present

## 2020-04-12 DIAGNOSIS — G311 Senile degeneration of brain, not elsewhere classified: Secondary | ICD-10-CM | POA: Diagnosis not present

## 2020-04-15 DIAGNOSIS — G311 Senile degeneration of brain, not elsewhere classified: Secondary | ICD-10-CM | POA: Diagnosis not present

## 2020-04-15 DIAGNOSIS — F0280 Dementia in other diseases classified elsewhere without behavioral disturbance: Secondary | ICD-10-CM | POA: Diagnosis not present

## 2020-04-15 DIAGNOSIS — R296 Repeated falls: Secondary | ICD-10-CM | POA: Diagnosis not present

## 2020-04-15 DIAGNOSIS — M4800 Spinal stenosis, site unspecified: Secondary | ICD-10-CM | POA: Diagnosis not present

## 2020-04-15 DIAGNOSIS — D649 Anemia, unspecified: Secondary | ICD-10-CM | POA: Diagnosis not present

## 2020-04-15 DIAGNOSIS — I509 Heart failure, unspecified: Secondary | ICD-10-CM | POA: Diagnosis not present

## 2020-04-16 DIAGNOSIS — D649 Anemia, unspecified: Secondary | ICD-10-CM | POA: Diagnosis not present

## 2020-04-16 DIAGNOSIS — M4800 Spinal stenosis, site unspecified: Secondary | ICD-10-CM | POA: Diagnosis not present

## 2020-04-16 DIAGNOSIS — F0280 Dementia in other diseases classified elsewhere without behavioral disturbance: Secondary | ICD-10-CM | POA: Diagnosis not present

## 2020-04-16 DIAGNOSIS — R296 Repeated falls: Secondary | ICD-10-CM | POA: Diagnosis not present

## 2020-04-16 DIAGNOSIS — I509 Heart failure, unspecified: Secondary | ICD-10-CM | POA: Diagnosis not present

## 2020-04-16 DIAGNOSIS — G311 Senile degeneration of brain, not elsewhere classified: Secondary | ICD-10-CM | POA: Diagnosis not present

## 2020-04-17 DIAGNOSIS — R296 Repeated falls: Secondary | ICD-10-CM | POA: Diagnosis not present

## 2020-04-17 DIAGNOSIS — I509 Heart failure, unspecified: Secondary | ICD-10-CM | POA: Diagnosis not present

## 2020-04-17 DIAGNOSIS — D649 Anemia, unspecified: Secondary | ICD-10-CM | POA: Diagnosis not present

## 2020-04-17 DIAGNOSIS — F0280 Dementia in other diseases classified elsewhere without behavioral disturbance: Secondary | ICD-10-CM | POA: Diagnosis not present

## 2020-04-17 DIAGNOSIS — G311 Senile degeneration of brain, not elsewhere classified: Secondary | ICD-10-CM | POA: Diagnosis not present

## 2020-04-17 DIAGNOSIS — M4800 Spinal stenosis, site unspecified: Secondary | ICD-10-CM | POA: Diagnosis not present

## 2020-04-18 DIAGNOSIS — R296 Repeated falls: Secondary | ICD-10-CM | POA: Diagnosis not present

## 2020-04-18 DIAGNOSIS — M4800 Spinal stenosis, site unspecified: Secondary | ICD-10-CM | POA: Diagnosis not present

## 2020-04-18 DIAGNOSIS — G311 Senile degeneration of brain, not elsewhere classified: Secondary | ICD-10-CM | POA: Diagnosis not present

## 2020-04-18 DIAGNOSIS — I509 Heart failure, unspecified: Secondary | ICD-10-CM | POA: Diagnosis not present

## 2020-04-18 DIAGNOSIS — D649 Anemia, unspecified: Secondary | ICD-10-CM | POA: Diagnosis not present

## 2020-04-18 DIAGNOSIS — F0280 Dementia in other diseases classified elsewhere without behavioral disturbance: Secondary | ICD-10-CM | POA: Diagnosis not present

## 2020-04-19 DIAGNOSIS — D649 Anemia, unspecified: Secondary | ICD-10-CM | POA: Diagnosis not present

## 2020-04-19 DIAGNOSIS — I509 Heart failure, unspecified: Secondary | ICD-10-CM | POA: Diagnosis not present

## 2020-04-19 DIAGNOSIS — R296 Repeated falls: Secondary | ICD-10-CM | POA: Diagnosis not present

## 2020-04-19 DIAGNOSIS — F0280 Dementia in other diseases classified elsewhere without behavioral disturbance: Secondary | ICD-10-CM | POA: Diagnosis not present

## 2020-04-19 DIAGNOSIS — M4800 Spinal stenosis, site unspecified: Secondary | ICD-10-CM | POA: Diagnosis not present

## 2020-04-19 DIAGNOSIS — G311 Senile degeneration of brain, not elsewhere classified: Secondary | ICD-10-CM | POA: Diagnosis not present

## 2020-04-22 DIAGNOSIS — I509 Heart failure, unspecified: Secondary | ICD-10-CM | POA: Diagnosis not present

## 2020-04-22 DIAGNOSIS — G311 Senile degeneration of brain, not elsewhere classified: Secondary | ICD-10-CM | POA: Diagnosis not present

## 2020-04-22 DIAGNOSIS — F0280 Dementia in other diseases classified elsewhere without behavioral disturbance: Secondary | ICD-10-CM | POA: Diagnosis not present

## 2020-04-22 DIAGNOSIS — M4800 Spinal stenosis, site unspecified: Secondary | ICD-10-CM | POA: Diagnosis not present

## 2020-04-22 DIAGNOSIS — R296 Repeated falls: Secondary | ICD-10-CM | POA: Diagnosis not present

## 2020-04-22 DIAGNOSIS — D649 Anemia, unspecified: Secondary | ICD-10-CM | POA: Diagnosis not present

## 2020-04-23 DIAGNOSIS — R296 Repeated falls: Secondary | ICD-10-CM | POA: Diagnosis not present

## 2020-04-23 DIAGNOSIS — I509 Heart failure, unspecified: Secondary | ICD-10-CM | POA: Diagnosis not present

## 2020-04-23 DIAGNOSIS — D649 Anemia, unspecified: Secondary | ICD-10-CM | POA: Diagnosis not present

## 2020-04-23 DIAGNOSIS — G311 Senile degeneration of brain, not elsewhere classified: Secondary | ICD-10-CM | POA: Diagnosis not present

## 2020-04-23 DIAGNOSIS — M4800 Spinal stenosis, site unspecified: Secondary | ICD-10-CM | POA: Diagnosis not present

## 2020-04-23 DIAGNOSIS — F0280 Dementia in other diseases classified elsewhere without behavioral disturbance: Secondary | ICD-10-CM | POA: Diagnosis not present

## 2020-04-24 DIAGNOSIS — F0280 Dementia in other diseases classified elsewhere without behavioral disturbance: Secondary | ICD-10-CM | POA: Diagnosis not present

## 2020-04-24 DIAGNOSIS — D649 Anemia, unspecified: Secondary | ICD-10-CM | POA: Diagnosis not present

## 2020-04-24 DIAGNOSIS — R296 Repeated falls: Secondary | ICD-10-CM | POA: Diagnosis not present

## 2020-04-24 DIAGNOSIS — M4800 Spinal stenosis, site unspecified: Secondary | ICD-10-CM | POA: Diagnosis not present

## 2020-04-24 DIAGNOSIS — I509 Heart failure, unspecified: Secondary | ICD-10-CM | POA: Diagnosis not present

## 2020-04-24 DIAGNOSIS — G311 Senile degeneration of brain, not elsewhere classified: Secondary | ICD-10-CM | POA: Diagnosis not present

## 2020-04-25 DIAGNOSIS — F0280 Dementia in other diseases classified elsewhere without behavioral disturbance: Secondary | ICD-10-CM | POA: Diagnosis not present

## 2020-04-25 DIAGNOSIS — M4800 Spinal stenosis, site unspecified: Secondary | ICD-10-CM | POA: Diagnosis not present

## 2020-04-25 DIAGNOSIS — G311 Senile degeneration of brain, not elsewhere classified: Secondary | ICD-10-CM | POA: Diagnosis not present

## 2020-04-25 DIAGNOSIS — I509 Heart failure, unspecified: Secondary | ICD-10-CM | POA: Diagnosis not present

## 2020-04-25 DIAGNOSIS — D649 Anemia, unspecified: Secondary | ICD-10-CM | POA: Diagnosis not present

## 2020-04-25 DIAGNOSIS — R296 Repeated falls: Secondary | ICD-10-CM | POA: Diagnosis not present

## 2020-04-26 DIAGNOSIS — G311 Senile degeneration of brain, not elsewhere classified: Secondary | ICD-10-CM | POA: Diagnosis not present

## 2020-04-26 DIAGNOSIS — M4800 Spinal stenosis, site unspecified: Secondary | ICD-10-CM | POA: Diagnosis not present

## 2020-04-26 DIAGNOSIS — F0280 Dementia in other diseases classified elsewhere without behavioral disturbance: Secondary | ICD-10-CM | POA: Diagnosis not present

## 2020-04-26 DIAGNOSIS — I509 Heart failure, unspecified: Secondary | ICD-10-CM | POA: Diagnosis not present

## 2020-04-26 DIAGNOSIS — R296 Repeated falls: Secondary | ICD-10-CM | POA: Diagnosis not present

## 2020-04-26 DIAGNOSIS — D649 Anemia, unspecified: Secondary | ICD-10-CM | POA: Diagnosis not present

## 2020-04-29 DIAGNOSIS — M4800 Spinal stenosis, site unspecified: Secondary | ICD-10-CM | POA: Diagnosis not present

## 2020-04-29 DIAGNOSIS — G311 Senile degeneration of brain, not elsewhere classified: Secondary | ICD-10-CM | POA: Diagnosis not present

## 2020-04-29 DIAGNOSIS — R296 Repeated falls: Secondary | ICD-10-CM | POA: Diagnosis not present

## 2020-04-29 DIAGNOSIS — I509 Heart failure, unspecified: Secondary | ICD-10-CM | POA: Diagnosis not present

## 2020-04-29 DIAGNOSIS — D649 Anemia, unspecified: Secondary | ICD-10-CM | POA: Diagnosis not present

## 2020-04-29 DIAGNOSIS — F0280 Dementia in other diseases classified elsewhere without behavioral disturbance: Secondary | ICD-10-CM | POA: Diagnosis not present

## 2020-04-30 DIAGNOSIS — F0280 Dementia in other diseases classified elsewhere without behavioral disturbance: Secondary | ICD-10-CM | POA: Diagnosis not present

## 2020-04-30 DIAGNOSIS — D649 Anemia, unspecified: Secondary | ICD-10-CM | POA: Diagnosis not present

## 2020-04-30 DIAGNOSIS — R296 Repeated falls: Secondary | ICD-10-CM | POA: Diagnosis not present

## 2020-04-30 DIAGNOSIS — I509 Heart failure, unspecified: Secondary | ICD-10-CM | POA: Diagnosis not present

## 2020-04-30 DIAGNOSIS — G311 Senile degeneration of brain, not elsewhere classified: Secondary | ICD-10-CM | POA: Diagnosis not present

## 2020-04-30 DIAGNOSIS — M4800 Spinal stenosis, site unspecified: Secondary | ICD-10-CM | POA: Diagnosis not present

## 2020-05-01 DIAGNOSIS — I10 Essential (primary) hypertension: Secondary | ICD-10-CM | POA: Diagnosis not present

## 2020-05-01 DIAGNOSIS — I509 Heart failure, unspecified: Secondary | ICD-10-CM | POA: Diagnosis not present

## 2020-05-01 DIAGNOSIS — G311 Senile degeneration of brain, not elsewhere classified: Secondary | ICD-10-CM | POA: Diagnosis not present

## 2020-05-01 DIAGNOSIS — Z515 Encounter for palliative care: Secondary | ICD-10-CM | POA: Diagnosis not present

## 2020-05-01 DIAGNOSIS — F0280 Dementia in other diseases classified elsewhere without behavioral disturbance: Secondary | ICD-10-CM | POA: Diagnosis not present

## 2020-05-01 DIAGNOSIS — R296 Repeated falls: Secondary | ICD-10-CM | POA: Diagnosis not present

## 2020-05-01 DIAGNOSIS — M4800 Spinal stenosis, site unspecified: Secondary | ICD-10-CM | POA: Diagnosis not present

## 2020-05-01 DIAGNOSIS — D649 Anemia, unspecified: Secondary | ICD-10-CM | POA: Diagnosis not present

## 2020-05-02 DIAGNOSIS — D649 Anemia, unspecified: Secondary | ICD-10-CM | POA: Diagnosis not present

## 2020-05-02 DIAGNOSIS — R296 Repeated falls: Secondary | ICD-10-CM | POA: Diagnosis not present

## 2020-05-02 DIAGNOSIS — F0280 Dementia in other diseases classified elsewhere without behavioral disturbance: Secondary | ICD-10-CM | POA: Diagnosis not present

## 2020-05-02 DIAGNOSIS — G311 Senile degeneration of brain, not elsewhere classified: Secondary | ICD-10-CM | POA: Diagnosis not present

## 2020-05-02 DIAGNOSIS — I509 Heart failure, unspecified: Secondary | ICD-10-CM | POA: Diagnosis not present

## 2020-05-02 DIAGNOSIS — M4800 Spinal stenosis, site unspecified: Secondary | ICD-10-CM | POA: Diagnosis not present

## 2020-05-03 DIAGNOSIS — D649 Anemia, unspecified: Secondary | ICD-10-CM | POA: Diagnosis not present

## 2020-05-03 DIAGNOSIS — F0280 Dementia in other diseases classified elsewhere without behavioral disturbance: Secondary | ICD-10-CM | POA: Diagnosis not present

## 2020-05-03 DIAGNOSIS — M4800 Spinal stenosis, site unspecified: Secondary | ICD-10-CM | POA: Diagnosis not present

## 2020-05-03 DIAGNOSIS — I509 Heart failure, unspecified: Secondary | ICD-10-CM | POA: Diagnosis not present

## 2020-05-03 DIAGNOSIS — G311 Senile degeneration of brain, not elsewhere classified: Secondary | ICD-10-CM | POA: Diagnosis not present

## 2020-05-03 DIAGNOSIS — R296 Repeated falls: Secondary | ICD-10-CM | POA: Diagnosis not present

## 2020-05-06 DIAGNOSIS — M4800 Spinal stenosis, site unspecified: Secondary | ICD-10-CM | POA: Diagnosis not present

## 2020-05-06 DIAGNOSIS — F0280 Dementia in other diseases classified elsewhere without behavioral disturbance: Secondary | ICD-10-CM | POA: Diagnosis not present

## 2020-05-06 DIAGNOSIS — G311 Senile degeneration of brain, not elsewhere classified: Secondary | ICD-10-CM | POA: Diagnosis not present

## 2020-05-06 DIAGNOSIS — D649 Anemia, unspecified: Secondary | ICD-10-CM | POA: Diagnosis not present

## 2020-05-06 DIAGNOSIS — I509 Heart failure, unspecified: Secondary | ICD-10-CM | POA: Diagnosis not present

## 2020-05-06 DIAGNOSIS — R296 Repeated falls: Secondary | ICD-10-CM | POA: Diagnosis not present

## 2020-05-07 DIAGNOSIS — R296 Repeated falls: Secondary | ICD-10-CM | POA: Diagnosis not present

## 2020-05-07 DIAGNOSIS — I509 Heart failure, unspecified: Secondary | ICD-10-CM | POA: Diagnosis not present

## 2020-05-07 DIAGNOSIS — F0280 Dementia in other diseases classified elsewhere without behavioral disturbance: Secondary | ICD-10-CM | POA: Diagnosis not present

## 2020-05-07 DIAGNOSIS — M4800 Spinal stenosis, site unspecified: Secondary | ICD-10-CM | POA: Diagnosis not present

## 2020-05-07 DIAGNOSIS — D649 Anemia, unspecified: Secondary | ICD-10-CM | POA: Diagnosis not present

## 2020-05-07 DIAGNOSIS — G311 Senile degeneration of brain, not elsewhere classified: Secondary | ICD-10-CM | POA: Diagnosis not present

## 2020-05-08 DIAGNOSIS — R296 Repeated falls: Secondary | ICD-10-CM | POA: Diagnosis not present

## 2020-05-08 DIAGNOSIS — G311 Senile degeneration of brain, not elsewhere classified: Secondary | ICD-10-CM | POA: Diagnosis not present

## 2020-05-08 DIAGNOSIS — D649 Anemia, unspecified: Secondary | ICD-10-CM | POA: Diagnosis not present

## 2020-05-08 DIAGNOSIS — F0280 Dementia in other diseases classified elsewhere without behavioral disturbance: Secondary | ICD-10-CM | POA: Diagnosis not present

## 2020-05-08 DIAGNOSIS — I509 Heart failure, unspecified: Secondary | ICD-10-CM | POA: Diagnosis not present

## 2020-05-08 DIAGNOSIS — M4800 Spinal stenosis, site unspecified: Secondary | ICD-10-CM | POA: Diagnosis not present

## 2020-05-09 DIAGNOSIS — M4800 Spinal stenosis, site unspecified: Secondary | ICD-10-CM | POA: Diagnosis not present

## 2020-05-09 DIAGNOSIS — R296 Repeated falls: Secondary | ICD-10-CM | POA: Diagnosis not present

## 2020-05-09 DIAGNOSIS — F0280 Dementia in other diseases classified elsewhere without behavioral disturbance: Secondary | ICD-10-CM | POA: Diagnosis not present

## 2020-05-09 DIAGNOSIS — D649 Anemia, unspecified: Secondary | ICD-10-CM | POA: Diagnosis not present

## 2020-05-09 DIAGNOSIS — G311 Senile degeneration of brain, not elsewhere classified: Secondary | ICD-10-CM | POA: Diagnosis not present

## 2020-05-09 DIAGNOSIS — I509 Heart failure, unspecified: Secondary | ICD-10-CM | POA: Diagnosis not present

## 2020-05-10 DIAGNOSIS — R296 Repeated falls: Secondary | ICD-10-CM | POA: Diagnosis not present

## 2020-05-10 DIAGNOSIS — F0280 Dementia in other diseases classified elsewhere without behavioral disturbance: Secondary | ICD-10-CM | POA: Diagnosis not present

## 2020-05-10 DIAGNOSIS — M4800 Spinal stenosis, site unspecified: Secondary | ICD-10-CM | POA: Diagnosis not present

## 2020-05-10 DIAGNOSIS — G311 Senile degeneration of brain, not elsewhere classified: Secondary | ICD-10-CM | POA: Diagnosis not present

## 2020-05-10 DIAGNOSIS — D649 Anemia, unspecified: Secondary | ICD-10-CM | POA: Diagnosis not present

## 2020-05-10 DIAGNOSIS — I509 Heart failure, unspecified: Secondary | ICD-10-CM | POA: Diagnosis not present

## 2020-05-13 DIAGNOSIS — G311 Senile degeneration of brain, not elsewhere classified: Secondary | ICD-10-CM | POA: Diagnosis not present

## 2020-05-13 DIAGNOSIS — I509 Heart failure, unspecified: Secondary | ICD-10-CM | POA: Diagnosis not present

## 2020-05-13 DIAGNOSIS — F0280 Dementia in other diseases classified elsewhere without behavioral disturbance: Secondary | ICD-10-CM | POA: Diagnosis not present

## 2020-05-13 DIAGNOSIS — D649 Anemia, unspecified: Secondary | ICD-10-CM | POA: Diagnosis not present

## 2020-05-13 DIAGNOSIS — R296 Repeated falls: Secondary | ICD-10-CM | POA: Diagnosis not present

## 2020-05-13 DIAGNOSIS — M4800 Spinal stenosis, site unspecified: Secondary | ICD-10-CM | POA: Diagnosis not present

## 2020-05-14 DIAGNOSIS — G311 Senile degeneration of brain, not elsewhere classified: Secondary | ICD-10-CM | POA: Diagnosis not present

## 2020-05-14 DIAGNOSIS — M4800 Spinal stenosis, site unspecified: Secondary | ICD-10-CM | POA: Diagnosis not present

## 2020-05-14 DIAGNOSIS — D649 Anemia, unspecified: Secondary | ICD-10-CM | POA: Diagnosis not present

## 2020-05-14 DIAGNOSIS — R296 Repeated falls: Secondary | ICD-10-CM | POA: Diagnosis not present

## 2020-05-14 DIAGNOSIS — I509 Heart failure, unspecified: Secondary | ICD-10-CM | POA: Diagnosis not present

## 2020-05-14 DIAGNOSIS — F0280 Dementia in other diseases classified elsewhere without behavioral disturbance: Secondary | ICD-10-CM | POA: Diagnosis not present

## 2020-05-15 DIAGNOSIS — G311 Senile degeneration of brain, not elsewhere classified: Secondary | ICD-10-CM | POA: Diagnosis not present

## 2020-05-15 DIAGNOSIS — R296 Repeated falls: Secondary | ICD-10-CM | POA: Diagnosis not present

## 2020-05-15 DIAGNOSIS — I509 Heart failure, unspecified: Secondary | ICD-10-CM | POA: Diagnosis not present

## 2020-05-15 DIAGNOSIS — M4800 Spinal stenosis, site unspecified: Secondary | ICD-10-CM | POA: Diagnosis not present

## 2020-05-15 DIAGNOSIS — D649 Anemia, unspecified: Secondary | ICD-10-CM | POA: Diagnosis not present

## 2020-05-15 DIAGNOSIS — F0280 Dementia in other diseases classified elsewhere without behavioral disturbance: Secondary | ICD-10-CM | POA: Diagnosis not present

## 2020-05-16 DIAGNOSIS — I509 Heart failure, unspecified: Secondary | ICD-10-CM | POA: Diagnosis not present

## 2020-05-16 DIAGNOSIS — D649 Anemia, unspecified: Secondary | ICD-10-CM | POA: Diagnosis not present

## 2020-05-16 DIAGNOSIS — F0280 Dementia in other diseases classified elsewhere without behavioral disturbance: Secondary | ICD-10-CM | POA: Diagnosis not present

## 2020-05-16 DIAGNOSIS — M4800 Spinal stenosis, site unspecified: Secondary | ICD-10-CM | POA: Diagnosis not present

## 2020-05-16 DIAGNOSIS — G311 Senile degeneration of brain, not elsewhere classified: Secondary | ICD-10-CM | POA: Diagnosis not present

## 2020-05-16 DIAGNOSIS — R296 Repeated falls: Secondary | ICD-10-CM | POA: Diagnosis not present

## 2020-05-17 DIAGNOSIS — R296 Repeated falls: Secondary | ICD-10-CM | POA: Diagnosis not present

## 2020-05-17 DIAGNOSIS — I509 Heart failure, unspecified: Secondary | ICD-10-CM | POA: Diagnosis not present

## 2020-05-17 DIAGNOSIS — F0280 Dementia in other diseases classified elsewhere without behavioral disturbance: Secondary | ICD-10-CM | POA: Diagnosis not present

## 2020-05-17 DIAGNOSIS — D649 Anemia, unspecified: Secondary | ICD-10-CM | POA: Diagnosis not present

## 2020-05-17 DIAGNOSIS — G311 Senile degeneration of brain, not elsewhere classified: Secondary | ICD-10-CM | POA: Diagnosis not present

## 2020-05-17 DIAGNOSIS — M4800 Spinal stenosis, site unspecified: Secondary | ICD-10-CM | POA: Diagnosis not present

## 2020-05-20 DIAGNOSIS — F0280 Dementia in other diseases classified elsewhere without behavioral disturbance: Secondary | ICD-10-CM | POA: Diagnosis not present

## 2020-05-20 DIAGNOSIS — D649 Anemia, unspecified: Secondary | ICD-10-CM | POA: Diagnosis not present

## 2020-05-20 DIAGNOSIS — R296 Repeated falls: Secondary | ICD-10-CM | POA: Diagnosis not present

## 2020-05-20 DIAGNOSIS — I509 Heart failure, unspecified: Secondary | ICD-10-CM | POA: Diagnosis not present

## 2020-05-20 DIAGNOSIS — M4800 Spinal stenosis, site unspecified: Secondary | ICD-10-CM | POA: Diagnosis not present

## 2020-05-20 DIAGNOSIS — G311 Senile degeneration of brain, not elsewhere classified: Secondary | ICD-10-CM | POA: Diagnosis not present

## 2020-05-21 DIAGNOSIS — D649 Anemia, unspecified: Secondary | ICD-10-CM | POA: Diagnosis not present

## 2020-05-21 DIAGNOSIS — F0280 Dementia in other diseases classified elsewhere without behavioral disturbance: Secondary | ICD-10-CM | POA: Diagnosis not present

## 2020-05-21 DIAGNOSIS — I509 Heart failure, unspecified: Secondary | ICD-10-CM | POA: Diagnosis not present

## 2020-05-21 DIAGNOSIS — R296 Repeated falls: Secondary | ICD-10-CM | POA: Diagnosis not present

## 2020-05-21 DIAGNOSIS — M4800 Spinal stenosis, site unspecified: Secondary | ICD-10-CM | POA: Diagnosis not present

## 2020-05-21 DIAGNOSIS — G311 Senile degeneration of brain, not elsewhere classified: Secondary | ICD-10-CM | POA: Diagnosis not present

## 2020-05-22 DIAGNOSIS — M4800 Spinal stenosis, site unspecified: Secondary | ICD-10-CM | POA: Diagnosis not present

## 2020-05-22 DIAGNOSIS — D649 Anemia, unspecified: Secondary | ICD-10-CM | POA: Diagnosis not present

## 2020-05-22 DIAGNOSIS — F0280 Dementia in other diseases classified elsewhere without behavioral disturbance: Secondary | ICD-10-CM | POA: Diagnosis not present

## 2020-05-22 DIAGNOSIS — I509 Heart failure, unspecified: Secondary | ICD-10-CM | POA: Diagnosis not present

## 2020-05-22 DIAGNOSIS — R296 Repeated falls: Secondary | ICD-10-CM | POA: Diagnosis not present

## 2020-05-22 DIAGNOSIS — G311 Senile degeneration of brain, not elsewhere classified: Secondary | ICD-10-CM | POA: Diagnosis not present

## 2020-05-23 DIAGNOSIS — R296 Repeated falls: Secondary | ICD-10-CM | POA: Diagnosis not present

## 2020-05-23 DIAGNOSIS — I509 Heart failure, unspecified: Secondary | ICD-10-CM | POA: Diagnosis not present

## 2020-05-23 DIAGNOSIS — D649 Anemia, unspecified: Secondary | ICD-10-CM | POA: Diagnosis not present

## 2020-05-23 DIAGNOSIS — M4800 Spinal stenosis, site unspecified: Secondary | ICD-10-CM | POA: Diagnosis not present

## 2020-05-23 DIAGNOSIS — G311 Senile degeneration of brain, not elsewhere classified: Secondary | ICD-10-CM | POA: Diagnosis not present

## 2020-05-23 DIAGNOSIS — F0280 Dementia in other diseases classified elsewhere without behavioral disturbance: Secondary | ICD-10-CM | POA: Diagnosis not present

## 2020-05-24 DIAGNOSIS — R296 Repeated falls: Secondary | ICD-10-CM | POA: Diagnosis not present

## 2020-05-24 DIAGNOSIS — I509 Heart failure, unspecified: Secondary | ICD-10-CM | POA: Diagnosis not present

## 2020-05-24 DIAGNOSIS — M4800 Spinal stenosis, site unspecified: Secondary | ICD-10-CM | POA: Diagnosis not present

## 2020-05-24 DIAGNOSIS — F0280 Dementia in other diseases classified elsewhere without behavioral disturbance: Secondary | ICD-10-CM | POA: Diagnosis not present

## 2020-05-24 DIAGNOSIS — D649 Anemia, unspecified: Secondary | ICD-10-CM | POA: Diagnosis not present

## 2020-05-24 DIAGNOSIS — G311 Senile degeneration of brain, not elsewhere classified: Secondary | ICD-10-CM | POA: Diagnosis not present

## 2020-05-27 DIAGNOSIS — D649 Anemia, unspecified: Secondary | ICD-10-CM | POA: Diagnosis not present

## 2020-05-27 DIAGNOSIS — M4800 Spinal stenosis, site unspecified: Secondary | ICD-10-CM | POA: Diagnosis not present

## 2020-05-27 DIAGNOSIS — I509 Heart failure, unspecified: Secondary | ICD-10-CM | POA: Diagnosis not present

## 2020-05-27 DIAGNOSIS — R296 Repeated falls: Secondary | ICD-10-CM | POA: Diagnosis not present

## 2020-05-27 DIAGNOSIS — F0280 Dementia in other diseases classified elsewhere without behavioral disturbance: Secondary | ICD-10-CM | POA: Diagnosis not present

## 2020-05-27 DIAGNOSIS — G311 Senile degeneration of brain, not elsewhere classified: Secondary | ICD-10-CM | POA: Diagnosis not present

## 2020-05-28 DIAGNOSIS — I509 Heart failure, unspecified: Secondary | ICD-10-CM | POA: Diagnosis not present

## 2020-05-28 DIAGNOSIS — D649 Anemia, unspecified: Secondary | ICD-10-CM | POA: Diagnosis not present

## 2020-05-28 DIAGNOSIS — G311 Senile degeneration of brain, not elsewhere classified: Secondary | ICD-10-CM | POA: Diagnosis not present

## 2020-05-28 DIAGNOSIS — F0280 Dementia in other diseases classified elsewhere without behavioral disturbance: Secondary | ICD-10-CM | POA: Diagnosis not present

## 2020-05-28 DIAGNOSIS — R296 Repeated falls: Secondary | ICD-10-CM | POA: Diagnosis not present

## 2020-05-28 DIAGNOSIS — M4800 Spinal stenosis, site unspecified: Secondary | ICD-10-CM | POA: Diagnosis not present

## 2020-05-29 DIAGNOSIS — F0280 Dementia in other diseases classified elsewhere without behavioral disturbance: Secondary | ICD-10-CM | POA: Diagnosis not present

## 2020-05-29 DIAGNOSIS — M4800 Spinal stenosis, site unspecified: Secondary | ICD-10-CM | POA: Diagnosis not present

## 2020-05-29 DIAGNOSIS — R296 Repeated falls: Secondary | ICD-10-CM | POA: Diagnosis not present

## 2020-05-29 DIAGNOSIS — I509 Heart failure, unspecified: Secondary | ICD-10-CM | POA: Diagnosis not present

## 2020-05-29 DIAGNOSIS — D649 Anemia, unspecified: Secondary | ICD-10-CM | POA: Diagnosis not present

## 2020-05-29 DIAGNOSIS — G311 Senile degeneration of brain, not elsewhere classified: Secondary | ICD-10-CM | POA: Diagnosis not present

## 2020-05-30 DIAGNOSIS — M4800 Spinal stenosis, site unspecified: Secondary | ICD-10-CM | POA: Diagnosis not present

## 2020-05-30 DIAGNOSIS — F0280 Dementia in other diseases classified elsewhere without behavioral disturbance: Secondary | ICD-10-CM | POA: Diagnosis not present

## 2020-05-30 DIAGNOSIS — G311 Senile degeneration of brain, not elsewhere classified: Secondary | ICD-10-CM | POA: Diagnosis not present

## 2020-05-30 DIAGNOSIS — R296 Repeated falls: Secondary | ICD-10-CM | POA: Diagnosis not present

## 2020-05-30 DIAGNOSIS — D649 Anemia, unspecified: Secondary | ICD-10-CM | POA: Diagnosis not present

## 2020-05-30 DIAGNOSIS — I509 Heart failure, unspecified: Secondary | ICD-10-CM | POA: Diagnosis not present

## 2020-05-31 DIAGNOSIS — Z515 Encounter for palliative care: Secondary | ICD-10-CM | POA: Diagnosis not present

## 2020-05-31 DIAGNOSIS — G311 Senile degeneration of brain, not elsewhere classified: Secondary | ICD-10-CM | POA: Diagnosis not present

## 2020-05-31 DIAGNOSIS — D649 Anemia, unspecified: Secondary | ICD-10-CM | POA: Diagnosis not present

## 2020-05-31 DIAGNOSIS — M4800 Spinal stenosis, site unspecified: Secondary | ICD-10-CM | POA: Diagnosis not present

## 2020-05-31 DIAGNOSIS — I10 Essential (primary) hypertension: Secondary | ICD-10-CM | POA: Diagnosis not present

## 2020-05-31 DIAGNOSIS — R296 Repeated falls: Secondary | ICD-10-CM | POA: Diagnosis not present

## 2020-05-31 DIAGNOSIS — F0280 Dementia in other diseases classified elsewhere without behavioral disturbance: Secondary | ICD-10-CM | POA: Diagnosis not present

## 2020-05-31 DIAGNOSIS — I509 Heart failure, unspecified: Secondary | ICD-10-CM | POA: Diagnosis not present

## 2020-06-03 DIAGNOSIS — F0280 Dementia in other diseases classified elsewhere without behavioral disturbance: Secondary | ICD-10-CM | POA: Diagnosis not present

## 2020-06-03 DIAGNOSIS — M4800 Spinal stenosis, site unspecified: Secondary | ICD-10-CM | POA: Diagnosis not present

## 2020-06-03 DIAGNOSIS — I509 Heart failure, unspecified: Secondary | ICD-10-CM | POA: Diagnosis not present

## 2020-06-03 DIAGNOSIS — D649 Anemia, unspecified: Secondary | ICD-10-CM | POA: Diagnosis not present

## 2020-06-03 DIAGNOSIS — G311 Senile degeneration of brain, not elsewhere classified: Secondary | ICD-10-CM | POA: Diagnosis not present

## 2020-06-03 DIAGNOSIS — R296 Repeated falls: Secondary | ICD-10-CM | POA: Diagnosis not present

## 2020-06-04 DIAGNOSIS — D649 Anemia, unspecified: Secondary | ICD-10-CM | POA: Diagnosis not present

## 2020-06-04 DIAGNOSIS — R296 Repeated falls: Secondary | ICD-10-CM | POA: Diagnosis not present

## 2020-06-04 DIAGNOSIS — F0280 Dementia in other diseases classified elsewhere without behavioral disturbance: Secondary | ICD-10-CM | POA: Diagnosis not present

## 2020-06-04 DIAGNOSIS — I509 Heart failure, unspecified: Secondary | ICD-10-CM | POA: Diagnosis not present

## 2020-06-04 DIAGNOSIS — M4800 Spinal stenosis, site unspecified: Secondary | ICD-10-CM | POA: Diagnosis not present

## 2020-06-04 DIAGNOSIS — G311 Senile degeneration of brain, not elsewhere classified: Secondary | ICD-10-CM | POA: Diagnosis not present

## 2020-06-05 DIAGNOSIS — M4800 Spinal stenosis, site unspecified: Secondary | ICD-10-CM | POA: Diagnosis not present

## 2020-06-05 DIAGNOSIS — I509 Heart failure, unspecified: Secondary | ICD-10-CM | POA: Diagnosis not present

## 2020-06-05 DIAGNOSIS — G311 Senile degeneration of brain, not elsewhere classified: Secondary | ICD-10-CM | POA: Diagnosis not present

## 2020-06-05 DIAGNOSIS — F0280 Dementia in other diseases classified elsewhere without behavioral disturbance: Secondary | ICD-10-CM | POA: Diagnosis not present

## 2020-06-05 DIAGNOSIS — R296 Repeated falls: Secondary | ICD-10-CM | POA: Diagnosis not present

## 2020-06-05 DIAGNOSIS — D649 Anemia, unspecified: Secondary | ICD-10-CM | POA: Diagnosis not present

## 2020-06-06 DIAGNOSIS — I509 Heart failure, unspecified: Secondary | ICD-10-CM | POA: Diagnosis not present

## 2020-06-06 DIAGNOSIS — F0280 Dementia in other diseases classified elsewhere without behavioral disturbance: Secondary | ICD-10-CM | POA: Diagnosis not present

## 2020-06-06 DIAGNOSIS — R296 Repeated falls: Secondary | ICD-10-CM | POA: Diagnosis not present

## 2020-06-06 DIAGNOSIS — M4800 Spinal stenosis, site unspecified: Secondary | ICD-10-CM | POA: Diagnosis not present

## 2020-06-06 DIAGNOSIS — D649 Anemia, unspecified: Secondary | ICD-10-CM | POA: Diagnosis not present

## 2020-06-06 DIAGNOSIS — G311 Senile degeneration of brain, not elsewhere classified: Secondary | ICD-10-CM | POA: Diagnosis not present

## 2020-06-07 DIAGNOSIS — M4800 Spinal stenosis, site unspecified: Secondary | ICD-10-CM | POA: Diagnosis not present

## 2020-06-07 DIAGNOSIS — F0280 Dementia in other diseases classified elsewhere without behavioral disturbance: Secondary | ICD-10-CM | POA: Diagnosis not present

## 2020-06-07 DIAGNOSIS — G311 Senile degeneration of brain, not elsewhere classified: Secondary | ICD-10-CM | POA: Diagnosis not present

## 2020-06-07 DIAGNOSIS — I509 Heart failure, unspecified: Secondary | ICD-10-CM | POA: Diagnosis not present

## 2020-06-07 DIAGNOSIS — D649 Anemia, unspecified: Secondary | ICD-10-CM | POA: Diagnosis not present

## 2020-06-07 DIAGNOSIS — R296 Repeated falls: Secondary | ICD-10-CM | POA: Diagnosis not present

## 2020-06-10 DIAGNOSIS — R296 Repeated falls: Secondary | ICD-10-CM | POA: Diagnosis not present

## 2020-06-10 DIAGNOSIS — F0280 Dementia in other diseases classified elsewhere without behavioral disturbance: Secondary | ICD-10-CM | POA: Diagnosis not present

## 2020-06-10 DIAGNOSIS — I509 Heart failure, unspecified: Secondary | ICD-10-CM | POA: Diagnosis not present

## 2020-06-10 DIAGNOSIS — M4800 Spinal stenosis, site unspecified: Secondary | ICD-10-CM | POA: Diagnosis not present

## 2020-06-10 DIAGNOSIS — D649 Anemia, unspecified: Secondary | ICD-10-CM | POA: Diagnosis not present

## 2020-06-10 DIAGNOSIS — G311 Senile degeneration of brain, not elsewhere classified: Secondary | ICD-10-CM | POA: Diagnosis not present

## 2020-06-11 DIAGNOSIS — R296 Repeated falls: Secondary | ICD-10-CM | POA: Diagnosis not present

## 2020-06-11 DIAGNOSIS — I509 Heart failure, unspecified: Secondary | ICD-10-CM | POA: Diagnosis not present

## 2020-06-11 DIAGNOSIS — M4800 Spinal stenosis, site unspecified: Secondary | ICD-10-CM | POA: Diagnosis not present

## 2020-06-11 DIAGNOSIS — D649 Anemia, unspecified: Secondary | ICD-10-CM | POA: Diagnosis not present

## 2020-06-11 DIAGNOSIS — G311 Senile degeneration of brain, not elsewhere classified: Secondary | ICD-10-CM | POA: Diagnosis not present

## 2020-06-11 DIAGNOSIS — F0280 Dementia in other diseases classified elsewhere without behavioral disturbance: Secondary | ICD-10-CM | POA: Diagnosis not present

## 2020-06-12 DIAGNOSIS — M4800 Spinal stenosis, site unspecified: Secondary | ICD-10-CM | POA: Diagnosis not present

## 2020-06-12 DIAGNOSIS — R296 Repeated falls: Secondary | ICD-10-CM | POA: Diagnosis not present

## 2020-06-12 DIAGNOSIS — G311 Senile degeneration of brain, not elsewhere classified: Secondary | ICD-10-CM | POA: Diagnosis not present

## 2020-06-12 DIAGNOSIS — F0280 Dementia in other diseases classified elsewhere without behavioral disturbance: Secondary | ICD-10-CM | POA: Diagnosis not present

## 2020-06-12 DIAGNOSIS — D649 Anemia, unspecified: Secondary | ICD-10-CM | POA: Diagnosis not present

## 2020-06-12 DIAGNOSIS — I509 Heart failure, unspecified: Secondary | ICD-10-CM | POA: Diagnosis not present

## 2020-06-13 DIAGNOSIS — R296 Repeated falls: Secondary | ICD-10-CM | POA: Diagnosis not present

## 2020-06-13 DIAGNOSIS — I509 Heart failure, unspecified: Secondary | ICD-10-CM | POA: Diagnosis not present

## 2020-06-13 DIAGNOSIS — F0280 Dementia in other diseases classified elsewhere without behavioral disturbance: Secondary | ICD-10-CM | POA: Diagnosis not present

## 2020-06-13 DIAGNOSIS — G311 Senile degeneration of brain, not elsewhere classified: Secondary | ICD-10-CM | POA: Diagnosis not present

## 2020-06-13 DIAGNOSIS — M4800 Spinal stenosis, site unspecified: Secondary | ICD-10-CM | POA: Diagnosis not present

## 2020-06-13 DIAGNOSIS — D649 Anemia, unspecified: Secondary | ICD-10-CM | POA: Diagnosis not present

## 2020-06-14 DIAGNOSIS — F0280 Dementia in other diseases classified elsewhere without behavioral disturbance: Secondary | ICD-10-CM | POA: Diagnosis not present

## 2020-06-14 DIAGNOSIS — G311 Senile degeneration of brain, not elsewhere classified: Secondary | ICD-10-CM | POA: Diagnosis not present

## 2020-06-14 DIAGNOSIS — D649 Anemia, unspecified: Secondary | ICD-10-CM | POA: Diagnosis not present

## 2020-06-14 DIAGNOSIS — R296 Repeated falls: Secondary | ICD-10-CM | POA: Diagnosis not present

## 2020-06-14 DIAGNOSIS — M4800 Spinal stenosis, site unspecified: Secondary | ICD-10-CM | POA: Diagnosis not present

## 2020-06-14 DIAGNOSIS — I509 Heart failure, unspecified: Secondary | ICD-10-CM | POA: Diagnosis not present

## 2020-06-17 DIAGNOSIS — F0280 Dementia in other diseases classified elsewhere without behavioral disturbance: Secondary | ICD-10-CM | POA: Diagnosis not present

## 2020-06-17 DIAGNOSIS — G311 Senile degeneration of brain, not elsewhere classified: Secondary | ICD-10-CM | POA: Diagnosis not present

## 2020-06-17 DIAGNOSIS — I509 Heart failure, unspecified: Secondary | ICD-10-CM | POA: Diagnosis not present

## 2020-06-17 DIAGNOSIS — R296 Repeated falls: Secondary | ICD-10-CM | POA: Diagnosis not present

## 2020-06-17 DIAGNOSIS — M4800 Spinal stenosis, site unspecified: Secondary | ICD-10-CM | POA: Diagnosis not present

## 2020-06-17 DIAGNOSIS — D649 Anemia, unspecified: Secondary | ICD-10-CM | POA: Diagnosis not present

## 2020-06-18 DIAGNOSIS — M4800 Spinal stenosis, site unspecified: Secondary | ICD-10-CM | POA: Diagnosis not present

## 2020-06-18 DIAGNOSIS — D649 Anemia, unspecified: Secondary | ICD-10-CM | POA: Diagnosis not present

## 2020-06-18 DIAGNOSIS — R296 Repeated falls: Secondary | ICD-10-CM | POA: Diagnosis not present

## 2020-06-18 DIAGNOSIS — F0280 Dementia in other diseases classified elsewhere without behavioral disturbance: Secondary | ICD-10-CM | POA: Diagnosis not present

## 2020-06-18 DIAGNOSIS — G311 Senile degeneration of brain, not elsewhere classified: Secondary | ICD-10-CM | POA: Diagnosis not present

## 2020-06-18 DIAGNOSIS — I509 Heart failure, unspecified: Secondary | ICD-10-CM | POA: Diagnosis not present

## 2020-06-19 DIAGNOSIS — G311 Senile degeneration of brain, not elsewhere classified: Secondary | ICD-10-CM | POA: Diagnosis not present

## 2020-06-19 DIAGNOSIS — I509 Heart failure, unspecified: Secondary | ICD-10-CM | POA: Diagnosis not present

## 2020-06-19 DIAGNOSIS — F0280 Dementia in other diseases classified elsewhere without behavioral disturbance: Secondary | ICD-10-CM | POA: Diagnosis not present

## 2020-06-19 DIAGNOSIS — R296 Repeated falls: Secondary | ICD-10-CM | POA: Diagnosis not present

## 2020-06-19 DIAGNOSIS — D649 Anemia, unspecified: Secondary | ICD-10-CM | POA: Diagnosis not present

## 2020-06-19 DIAGNOSIS — M4800 Spinal stenosis, site unspecified: Secondary | ICD-10-CM | POA: Diagnosis not present

## 2020-06-20 DIAGNOSIS — I509 Heart failure, unspecified: Secondary | ICD-10-CM | POA: Diagnosis not present

## 2020-06-20 DIAGNOSIS — D649 Anemia, unspecified: Secondary | ICD-10-CM | POA: Diagnosis not present

## 2020-06-20 DIAGNOSIS — M4800 Spinal stenosis, site unspecified: Secondary | ICD-10-CM | POA: Diagnosis not present

## 2020-06-20 DIAGNOSIS — F0280 Dementia in other diseases classified elsewhere without behavioral disturbance: Secondary | ICD-10-CM | POA: Diagnosis not present

## 2020-06-20 DIAGNOSIS — G311 Senile degeneration of brain, not elsewhere classified: Secondary | ICD-10-CM | POA: Diagnosis not present

## 2020-06-20 DIAGNOSIS — R296 Repeated falls: Secondary | ICD-10-CM | POA: Diagnosis not present

## 2020-06-21 ENCOUNTER — Other Ambulatory Visit: Payer: Self-pay | Admitting: *Deleted

## 2020-06-21 ENCOUNTER — Other Ambulatory Visit: Payer: Self-pay

## 2020-06-21 DIAGNOSIS — G311 Senile degeneration of brain, not elsewhere classified: Secondary | ICD-10-CM | POA: Diagnosis not present

## 2020-06-21 DIAGNOSIS — I509 Heart failure, unspecified: Secondary | ICD-10-CM | POA: Diagnosis not present

## 2020-06-21 DIAGNOSIS — M4800 Spinal stenosis, site unspecified: Secondary | ICD-10-CM | POA: Diagnosis not present

## 2020-06-21 DIAGNOSIS — D649 Anemia, unspecified: Secondary | ICD-10-CM | POA: Diagnosis not present

## 2020-06-21 DIAGNOSIS — R296 Repeated falls: Secondary | ICD-10-CM | POA: Diagnosis not present

## 2020-06-21 DIAGNOSIS — F0280 Dementia in other diseases classified elsewhere without behavioral disturbance: Secondary | ICD-10-CM | POA: Diagnosis not present

## 2020-06-21 NOTE — Patient Outreach (Signed)
[facility]) Care Management  06/21/2020  EDA MAGNUSSEN 14-Aug-1941 [id number]   [facility] Quarterly outreach to complex care patient   Mrs Sanz was [facility] on 07/[facility]/19 after he [facility] for Complex care services/. Transition of care services were completed for 4 weeks Messages left with her daughter, [name] on 09/08/19  Noted ED visit in July 2021 for abdominal pain, nausea Left ED without treatment    Quarterly Follow up  Successful outreach to patient and her husband, [name] She reports she is doing very well and is not needing any services at this time She and [name] encouraged outreach to [place] and/or [employer]. [name] is now the care giver per [place] outreach to [name], Daughter with patient and spouse permission  No answer. THN RN CM left HIPAA [facility] Portability and Accountability Act) compliant voicemail message along with CM's contact info.     Plan: [facility] RN CM scheduled this THN engaged patient for another call attempt within 7-10 business days to her daughter [name] as discussed  [name] L. Lavina Hamman, RN, BSN, [place] Coordinator Office number [phone] Main [facility] number [phone] Fax number [phone]

## 2020-06-24 DIAGNOSIS — G311 Senile degeneration of brain, not elsewhere classified: Secondary | ICD-10-CM | POA: Diagnosis not present

## 2020-06-24 DIAGNOSIS — D649 Anemia, unspecified: Secondary | ICD-10-CM | POA: Diagnosis not present

## 2020-06-24 DIAGNOSIS — I509 Heart failure, unspecified: Secondary | ICD-10-CM | POA: Diagnosis not present

## 2020-06-24 DIAGNOSIS — R296 Repeated falls: Secondary | ICD-10-CM | POA: Diagnosis not present

## 2020-06-24 DIAGNOSIS — M4800 Spinal stenosis, site unspecified: Secondary | ICD-10-CM | POA: Diagnosis not present

## 2020-06-24 DIAGNOSIS — F0280 Dementia in other diseases classified elsewhere without behavioral disturbance: Secondary | ICD-10-CM | POA: Diagnosis not present

## 2020-06-25 DIAGNOSIS — F0280 Dementia in other diseases classified elsewhere without behavioral disturbance: Secondary | ICD-10-CM | POA: Diagnosis not present

## 2020-06-25 DIAGNOSIS — D649 Anemia, unspecified: Secondary | ICD-10-CM | POA: Diagnosis not present

## 2020-06-25 DIAGNOSIS — I509 Heart failure, unspecified: Secondary | ICD-10-CM | POA: Diagnosis not present

## 2020-06-25 DIAGNOSIS — R296 Repeated falls: Secondary | ICD-10-CM | POA: Diagnosis not present

## 2020-06-25 DIAGNOSIS — G311 Senile degeneration of brain, not elsewhere classified: Secondary | ICD-10-CM | POA: Diagnosis not present

## 2020-06-25 DIAGNOSIS — M4800 Spinal stenosis, site unspecified: Secondary | ICD-10-CM | POA: Diagnosis not present

## 2020-06-26 DIAGNOSIS — G311 Senile degeneration of brain, not elsewhere classified: Secondary | ICD-10-CM | POA: Diagnosis not present

## 2020-06-26 DIAGNOSIS — M4800 Spinal stenosis, site unspecified: Secondary | ICD-10-CM | POA: Diagnosis not present

## 2020-06-26 DIAGNOSIS — F0280 Dementia in other diseases classified elsewhere without behavioral disturbance: Secondary | ICD-10-CM | POA: Diagnosis not present

## 2020-06-26 DIAGNOSIS — I509 Heart failure, unspecified: Secondary | ICD-10-CM | POA: Diagnosis not present

## 2020-06-26 DIAGNOSIS — R296 Repeated falls: Secondary | ICD-10-CM | POA: Diagnosis not present

## 2020-06-26 DIAGNOSIS — D649 Anemia, unspecified: Secondary | ICD-10-CM | POA: Diagnosis not present

## 2020-06-27 DIAGNOSIS — G311 Senile degeneration of brain, not elsewhere classified: Secondary | ICD-10-CM | POA: Diagnosis not present

## 2020-06-27 DIAGNOSIS — R296 Repeated falls: Secondary | ICD-10-CM | POA: Diagnosis not present

## 2020-06-27 DIAGNOSIS — M4800 Spinal stenosis, site unspecified: Secondary | ICD-10-CM | POA: Diagnosis not present

## 2020-06-27 DIAGNOSIS — D649 Anemia, unspecified: Secondary | ICD-10-CM | POA: Diagnosis not present

## 2020-06-27 DIAGNOSIS — I509 Heart failure, unspecified: Secondary | ICD-10-CM | POA: Diagnosis not present

## 2020-06-27 DIAGNOSIS — F0280 Dementia in other diseases classified elsewhere without behavioral disturbance: Secondary | ICD-10-CM | POA: Diagnosis not present

## 2020-06-28 DIAGNOSIS — R296 Repeated falls: Secondary | ICD-10-CM | POA: Diagnosis not present

## 2020-06-28 DIAGNOSIS — G311 Senile degeneration of brain, not elsewhere classified: Secondary | ICD-10-CM | POA: Diagnosis not present

## 2020-06-28 DIAGNOSIS — M4800 Spinal stenosis, site unspecified: Secondary | ICD-10-CM | POA: Diagnosis not present

## 2020-06-28 DIAGNOSIS — F0280 Dementia in other diseases classified elsewhere without behavioral disturbance: Secondary | ICD-10-CM | POA: Diagnosis not present

## 2020-06-28 DIAGNOSIS — D649 Anemia, unspecified: Secondary | ICD-10-CM | POA: Diagnosis not present

## 2020-06-28 DIAGNOSIS — I509 Heart failure, unspecified: Secondary | ICD-10-CM | POA: Diagnosis not present

## 2020-07-01 DIAGNOSIS — D649 Anemia, unspecified: Secondary | ICD-10-CM | POA: Diagnosis not present

## 2020-07-01 DIAGNOSIS — F0280 Dementia in other diseases classified elsewhere without behavioral disturbance: Secondary | ICD-10-CM | POA: Diagnosis not present

## 2020-07-01 DIAGNOSIS — R296 Repeated falls: Secondary | ICD-10-CM | POA: Diagnosis not present

## 2020-07-01 DIAGNOSIS — Z515 Encounter for palliative care: Secondary | ICD-10-CM | POA: Diagnosis not present

## 2020-07-01 DIAGNOSIS — I10 Essential (primary) hypertension: Secondary | ICD-10-CM | POA: Diagnosis not present

## 2020-07-01 DIAGNOSIS — I509 Heart failure, unspecified: Secondary | ICD-10-CM | POA: Diagnosis not present

## 2020-07-01 DIAGNOSIS — G311 Senile degeneration of brain, not elsewhere classified: Secondary | ICD-10-CM | POA: Diagnosis not present

## 2020-07-01 DIAGNOSIS — M4800 Spinal stenosis, site unspecified: Secondary | ICD-10-CM | POA: Diagnosis not present

## 2020-07-02 DIAGNOSIS — I509 Heart failure, unspecified: Secondary | ICD-10-CM | POA: Diagnosis not present

## 2020-07-02 DIAGNOSIS — F0280 Dementia in other diseases classified elsewhere without behavioral disturbance: Secondary | ICD-10-CM | POA: Diagnosis not present

## 2020-07-02 DIAGNOSIS — M4800 Spinal stenosis, site unspecified: Secondary | ICD-10-CM | POA: Diagnosis not present

## 2020-07-02 DIAGNOSIS — G311 Senile degeneration of brain, not elsewhere classified: Secondary | ICD-10-CM | POA: Diagnosis not present

## 2020-07-02 DIAGNOSIS — R296 Repeated falls: Secondary | ICD-10-CM | POA: Diagnosis not present

## 2020-07-02 DIAGNOSIS — D649 Anemia, unspecified: Secondary | ICD-10-CM | POA: Diagnosis not present

## 2020-07-03 DIAGNOSIS — M4800 Spinal stenosis, site unspecified: Secondary | ICD-10-CM | POA: Diagnosis not present

## 2020-07-03 DIAGNOSIS — G311 Senile degeneration of brain, not elsewhere classified: Secondary | ICD-10-CM | POA: Diagnosis not present

## 2020-07-03 DIAGNOSIS — I509 Heart failure, unspecified: Secondary | ICD-10-CM | POA: Diagnosis not present

## 2020-07-03 DIAGNOSIS — F0280 Dementia in other diseases classified elsewhere without behavioral disturbance: Secondary | ICD-10-CM | POA: Diagnosis not present

## 2020-07-03 DIAGNOSIS — D649 Anemia, unspecified: Secondary | ICD-10-CM | POA: Diagnosis not present

## 2020-07-03 DIAGNOSIS — R296 Repeated falls: Secondary | ICD-10-CM | POA: Diagnosis not present

## 2020-07-04 ENCOUNTER — Other Ambulatory Visit: Payer: Self-pay | Admitting: *Deleted

## 2020-07-04 ENCOUNTER — Other Ambulatory Visit: Payer: Self-pay

## 2020-07-04 DIAGNOSIS — R296 Repeated falls: Secondary | ICD-10-CM | POA: Diagnosis not present

## 2020-07-04 DIAGNOSIS — F0280 Dementia in other diseases classified elsewhere without behavioral disturbance: Secondary | ICD-10-CM | POA: Diagnosis not present

## 2020-07-04 DIAGNOSIS — D649 Anemia, unspecified: Secondary | ICD-10-CM | POA: Diagnosis not present

## 2020-07-04 DIAGNOSIS — G311 Senile degeneration of brain, not elsewhere classified: Secondary | ICD-10-CM | POA: Diagnosis not present

## 2020-07-04 DIAGNOSIS — M4800 Spinal stenosis, site unspecified: Secondary | ICD-10-CM | POA: Diagnosis not present

## 2020-07-04 DIAGNOSIS — I509 Heart failure, unspecified: Secondary | ICD-10-CM | POA: Diagnosis not present

## 2020-07-04 NOTE — Patient Outreach (Signed)
[facility]) Care Management  07/04/2020  TYREANNA BISESI [birth year] [id number]   [facility] Quarterly outreach and case closure for complex care patient  Mrs Record was referred to [facility] on 07/31/19 after he discharge from [facility] for Complex care services/. Transition of care services were completed for 4 weeks Messages left with her daughter, [name] on 09/08/19  Noted ED visit in July 2021 for abdominal pain, nausea Left ED without treatment    Quarterly Follow up  Successful outreach to patient & spouse Pt sleeping Her husband, [name] reports she is doing very well and is not needing any services at this time [name] encouraged outreach to [place], now the care giver per [name] No answer to [name] at [phone] Automated system reported person is not receiving phone calls  No response from [name], Daughter after voice message left for her on 06/21/20     Plan: [facility] RN CM completed case closure -goals met pt agrees to discontinue services  Letters sent to MD and pt c/o vivian as requested   [facility] CM Care Plan Problem One     Most Recent Value  Care Plan Problem One knowledge deficit of home care for Hip arthralgia, failure to thrive, DM & HTN  Role Documenting the Problem One Care Management Telephonic Coordinator  Care Plan for Problem One Active  [facility] Long Term Goal  over the next 45 days patient and family will be able to verbalize improvement in home care needs for Hip arthralgia, failure to thrive, DM and HTN  during follow up calls  [facility] Long Term Goal Start Date 08/02/19  [facility] Long Term Goal Met Date 09/22/19  [facility] CM Short Term Goal #1  over the next 21 days patient will have DME to assist with incontinence as verbalized during follow up calls  [facility] CM Short Term Goal #1 Start Date 08/31/19  [facility] CM Short Term Goal #1 Met Date 09/22/19  [facility] CM Short Term Goal #2  over the next 14 days pt will have increase mobility as verbalized during follow up calls  [facility]  CM Short Term Goal #2 Start Date 09/22/19  [facility] CM Short Term Goal #2 Met Date 12/21/19  [facility] CM Short Term Goal #3 over the next 14 days patietn will have follow up visit with primary care MD (virtually, telephonically or office visit) as verbalized during follow up call  [facility] CM Short Term Goal #3 Start Date 08/02/19  [facility] CM Short Term Goal #3 Met Date 08/09/19  [facility] CM Short Term Goal #4 over the next 200 days the patient will continue to receive home care needs to manage her HTN and DM  [facility] CM Short Term Goal #4 Start Date 12/21/19  [facility] CM Short Term Goal #4 Met Date 07/04/20  Interventions for Short Term Goal #4 assessed for any changes in home care or care coordination needs, sent EMMI materials       Kaida Games L. Lavina Hamman, RN, BSN, [facility] Coordinator Office number [phone] [facility] number [phone] Fax number [phone]

## 2020-07-05 DIAGNOSIS — R296 Repeated falls: Secondary | ICD-10-CM | POA: Diagnosis not present

## 2020-07-05 DIAGNOSIS — G311 Senile degeneration of brain, not elsewhere classified: Secondary | ICD-10-CM | POA: Diagnosis not present

## 2020-07-05 DIAGNOSIS — I509 Heart failure, unspecified: Secondary | ICD-10-CM | POA: Diagnosis not present

## 2020-07-05 DIAGNOSIS — D649 Anemia, unspecified: Secondary | ICD-10-CM | POA: Diagnosis not present

## 2020-07-05 DIAGNOSIS — F0280 Dementia in other diseases classified elsewhere without behavioral disturbance: Secondary | ICD-10-CM | POA: Diagnosis not present

## 2020-07-05 DIAGNOSIS — M4800 Spinal stenosis, site unspecified: Secondary | ICD-10-CM | POA: Diagnosis not present

## 2020-07-08 DIAGNOSIS — D649 Anemia, unspecified: Secondary | ICD-10-CM | POA: Diagnosis not present

## 2020-07-08 DIAGNOSIS — R296 Repeated falls: Secondary | ICD-10-CM | POA: Diagnosis not present

## 2020-07-08 DIAGNOSIS — F0280 Dementia in other diseases classified elsewhere without behavioral disturbance: Secondary | ICD-10-CM | POA: Diagnosis not present

## 2020-07-08 DIAGNOSIS — M4800 Spinal stenosis, site unspecified: Secondary | ICD-10-CM | POA: Diagnosis not present

## 2020-07-08 DIAGNOSIS — G311 Senile degeneration of brain, not elsewhere classified: Secondary | ICD-10-CM | POA: Diagnosis not present

## 2020-07-08 DIAGNOSIS — I509 Heart failure, unspecified: Secondary | ICD-10-CM | POA: Diagnosis not present

## 2020-07-09 DIAGNOSIS — G311 Senile degeneration of brain, not elsewhere classified: Secondary | ICD-10-CM | POA: Diagnosis not present

## 2020-07-09 DIAGNOSIS — F0280 Dementia in other diseases classified elsewhere without behavioral disturbance: Secondary | ICD-10-CM | POA: Diagnosis not present

## 2020-07-09 DIAGNOSIS — R296 Repeated falls: Secondary | ICD-10-CM | POA: Diagnosis not present

## 2020-07-09 DIAGNOSIS — M4800 Spinal stenosis, site unspecified: Secondary | ICD-10-CM | POA: Diagnosis not present

## 2020-07-09 DIAGNOSIS — D649 Anemia, unspecified: Secondary | ICD-10-CM | POA: Diagnosis not present

## 2020-07-09 DIAGNOSIS — I509 Heart failure, unspecified: Secondary | ICD-10-CM | POA: Diagnosis not present

## 2020-07-10 DIAGNOSIS — D649 Anemia, unspecified: Secondary | ICD-10-CM | POA: Diagnosis not present

## 2020-07-10 DIAGNOSIS — M4800 Spinal stenosis, site unspecified: Secondary | ICD-10-CM | POA: Diagnosis not present

## 2020-07-10 DIAGNOSIS — G311 Senile degeneration of brain, not elsewhere classified: Secondary | ICD-10-CM | POA: Diagnosis not present

## 2020-07-10 DIAGNOSIS — I509 Heart failure, unspecified: Secondary | ICD-10-CM | POA: Diagnosis not present

## 2020-07-10 DIAGNOSIS — R296 Repeated falls: Secondary | ICD-10-CM | POA: Diagnosis not present

## 2020-07-10 DIAGNOSIS — F0280 Dementia in other diseases classified elsewhere without behavioral disturbance: Secondary | ICD-10-CM | POA: Diagnosis not present

## 2020-07-11 DIAGNOSIS — D649 Anemia, unspecified: Secondary | ICD-10-CM | POA: Diagnosis not present

## 2020-07-11 DIAGNOSIS — F0280 Dementia in other diseases classified elsewhere without behavioral disturbance: Secondary | ICD-10-CM | POA: Diagnosis not present

## 2020-07-11 DIAGNOSIS — R296 Repeated falls: Secondary | ICD-10-CM | POA: Diagnosis not present

## 2020-07-11 DIAGNOSIS — I509 Heart failure, unspecified: Secondary | ICD-10-CM | POA: Diagnosis not present

## 2020-07-11 DIAGNOSIS — M4800 Spinal stenosis, site unspecified: Secondary | ICD-10-CM | POA: Diagnosis not present

## 2020-07-11 DIAGNOSIS — G311 Senile degeneration of brain, not elsewhere classified: Secondary | ICD-10-CM | POA: Diagnosis not present

## 2020-07-12 DIAGNOSIS — M4800 Spinal stenosis, site unspecified: Secondary | ICD-10-CM | POA: Diagnosis not present

## 2020-07-12 DIAGNOSIS — I509 Heart failure, unspecified: Secondary | ICD-10-CM | POA: Diagnosis not present

## 2020-07-12 DIAGNOSIS — F0280 Dementia in other diseases classified elsewhere without behavioral disturbance: Secondary | ICD-10-CM | POA: Diagnosis not present

## 2020-07-12 DIAGNOSIS — D649 Anemia, unspecified: Secondary | ICD-10-CM | POA: Diagnosis not present

## 2020-07-12 DIAGNOSIS — R296 Repeated falls: Secondary | ICD-10-CM | POA: Diagnosis not present

## 2020-07-12 DIAGNOSIS — G311 Senile degeneration of brain, not elsewhere classified: Secondary | ICD-10-CM | POA: Diagnosis not present

## 2020-07-15 DIAGNOSIS — M4800 Spinal stenosis, site unspecified: Secondary | ICD-10-CM | POA: Diagnosis not present

## 2020-07-15 DIAGNOSIS — I509 Heart failure, unspecified: Secondary | ICD-10-CM | POA: Diagnosis not present

## 2020-07-15 DIAGNOSIS — D649 Anemia, unspecified: Secondary | ICD-10-CM | POA: Diagnosis not present

## 2020-07-15 DIAGNOSIS — F0280 Dementia in other diseases classified elsewhere without behavioral disturbance: Secondary | ICD-10-CM | POA: Diagnosis not present

## 2020-07-15 DIAGNOSIS — R296 Repeated falls: Secondary | ICD-10-CM | POA: Diagnosis not present

## 2020-07-15 DIAGNOSIS — G311 Senile degeneration of brain, not elsewhere classified: Secondary | ICD-10-CM | POA: Diagnosis not present

## 2020-07-16 DIAGNOSIS — R296 Repeated falls: Secondary | ICD-10-CM | POA: Diagnosis not present

## 2020-07-16 DIAGNOSIS — G311 Senile degeneration of brain, not elsewhere classified: Secondary | ICD-10-CM | POA: Diagnosis not present

## 2020-07-16 DIAGNOSIS — F0280 Dementia in other diseases classified elsewhere without behavioral disturbance: Secondary | ICD-10-CM | POA: Diagnosis not present

## 2020-07-16 DIAGNOSIS — I509 Heart failure, unspecified: Secondary | ICD-10-CM | POA: Diagnosis not present

## 2020-07-16 DIAGNOSIS — M4800 Spinal stenosis, site unspecified: Secondary | ICD-10-CM | POA: Diagnosis not present

## 2020-07-16 DIAGNOSIS — D649 Anemia, unspecified: Secondary | ICD-10-CM | POA: Diagnosis not present

## 2020-07-17 DIAGNOSIS — M4800 Spinal stenosis, site unspecified: Secondary | ICD-10-CM | POA: Diagnosis not present

## 2020-07-17 DIAGNOSIS — R296 Repeated falls: Secondary | ICD-10-CM | POA: Diagnosis not present

## 2020-07-17 DIAGNOSIS — G311 Senile degeneration of brain, not elsewhere classified: Secondary | ICD-10-CM | POA: Diagnosis not present

## 2020-07-17 DIAGNOSIS — F0280 Dementia in other diseases classified elsewhere without behavioral disturbance: Secondary | ICD-10-CM | POA: Diagnosis not present

## 2020-07-17 DIAGNOSIS — I509 Heart failure, unspecified: Secondary | ICD-10-CM | POA: Diagnosis not present

## 2020-07-17 DIAGNOSIS — D649 Anemia, unspecified: Secondary | ICD-10-CM | POA: Diagnosis not present

## 2020-07-18 DIAGNOSIS — G311 Senile degeneration of brain, not elsewhere classified: Secondary | ICD-10-CM | POA: Diagnosis not present

## 2020-07-18 DIAGNOSIS — R296 Repeated falls: Secondary | ICD-10-CM | POA: Diagnosis not present

## 2020-07-18 DIAGNOSIS — F0280 Dementia in other diseases classified elsewhere without behavioral disturbance: Secondary | ICD-10-CM | POA: Diagnosis not present

## 2020-07-18 DIAGNOSIS — M4800 Spinal stenosis, site unspecified: Secondary | ICD-10-CM | POA: Diagnosis not present

## 2020-07-18 DIAGNOSIS — D649 Anemia, unspecified: Secondary | ICD-10-CM | POA: Diagnosis not present

## 2020-07-18 DIAGNOSIS — I509 Heart failure, unspecified: Secondary | ICD-10-CM | POA: Diagnosis not present

## 2020-07-19 DIAGNOSIS — I509 Heart failure, unspecified: Secondary | ICD-10-CM | POA: Diagnosis not present

## 2020-07-19 DIAGNOSIS — D649 Anemia, unspecified: Secondary | ICD-10-CM | POA: Diagnosis not present

## 2020-07-19 DIAGNOSIS — F0280 Dementia in other diseases classified elsewhere without behavioral disturbance: Secondary | ICD-10-CM | POA: Diagnosis not present

## 2020-07-19 DIAGNOSIS — R296 Repeated falls: Secondary | ICD-10-CM | POA: Diagnosis not present

## 2020-07-19 DIAGNOSIS — M4800 Spinal stenosis, site unspecified: Secondary | ICD-10-CM | POA: Diagnosis not present

## 2020-07-19 DIAGNOSIS — G311 Senile degeneration of brain, not elsewhere classified: Secondary | ICD-10-CM | POA: Diagnosis not present

## 2020-07-22 DIAGNOSIS — R296 Repeated falls: Secondary | ICD-10-CM | POA: Diagnosis not present

## 2020-07-22 DIAGNOSIS — M4800 Spinal stenosis, site unspecified: Secondary | ICD-10-CM | POA: Diagnosis not present

## 2020-07-22 DIAGNOSIS — I509 Heart failure, unspecified: Secondary | ICD-10-CM | POA: Diagnosis not present

## 2020-07-22 DIAGNOSIS — F0280 Dementia in other diseases classified elsewhere without behavioral disturbance: Secondary | ICD-10-CM | POA: Diagnosis not present

## 2020-07-22 DIAGNOSIS — D649 Anemia, unspecified: Secondary | ICD-10-CM | POA: Diagnosis not present

## 2020-07-22 DIAGNOSIS — G311 Senile degeneration of brain, not elsewhere classified: Secondary | ICD-10-CM | POA: Diagnosis not present

## 2020-07-23 DIAGNOSIS — F0280 Dementia in other diseases classified elsewhere without behavioral disturbance: Secondary | ICD-10-CM | POA: Diagnosis not present

## 2020-07-23 DIAGNOSIS — G311 Senile degeneration of brain, not elsewhere classified: Secondary | ICD-10-CM | POA: Diagnosis not present

## 2020-07-23 DIAGNOSIS — I509 Heart failure, unspecified: Secondary | ICD-10-CM | POA: Diagnosis not present

## 2020-07-23 DIAGNOSIS — R296 Repeated falls: Secondary | ICD-10-CM | POA: Diagnosis not present

## 2020-07-23 DIAGNOSIS — D649 Anemia, unspecified: Secondary | ICD-10-CM | POA: Diagnosis not present

## 2020-07-23 DIAGNOSIS — M4800 Spinal stenosis, site unspecified: Secondary | ICD-10-CM | POA: Diagnosis not present

## 2020-07-24 DIAGNOSIS — M4800 Spinal stenosis, site unspecified: Secondary | ICD-10-CM | POA: Diagnosis not present

## 2020-07-24 DIAGNOSIS — I509 Heart failure, unspecified: Secondary | ICD-10-CM | POA: Diagnosis not present

## 2020-07-24 DIAGNOSIS — G311 Senile degeneration of brain, not elsewhere classified: Secondary | ICD-10-CM | POA: Diagnosis not present

## 2020-07-24 DIAGNOSIS — D649 Anemia, unspecified: Secondary | ICD-10-CM | POA: Diagnosis not present

## 2020-07-24 DIAGNOSIS — R296 Repeated falls: Secondary | ICD-10-CM | POA: Diagnosis not present

## 2020-07-24 DIAGNOSIS — F0280 Dementia in other diseases classified elsewhere without behavioral disturbance: Secondary | ICD-10-CM | POA: Diagnosis not present

## 2020-07-25 DIAGNOSIS — F0280 Dementia in other diseases classified elsewhere without behavioral disturbance: Secondary | ICD-10-CM | POA: Diagnosis not present

## 2020-07-25 DIAGNOSIS — G311 Senile degeneration of brain, not elsewhere classified: Secondary | ICD-10-CM | POA: Diagnosis not present

## 2020-07-25 DIAGNOSIS — I509 Heart failure, unspecified: Secondary | ICD-10-CM | POA: Diagnosis not present

## 2020-07-25 DIAGNOSIS — M4800 Spinal stenosis, site unspecified: Secondary | ICD-10-CM | POA: Diagnosis not present

## 2020-07-25 DIAGNOSIS — D649 Anemia, unspecified: Secondary | ICD-10-CM | POA: Diagnosis not present

## 2020-07-25 DIAGNOSIS — R296 Repeated falls: Secondary | ICD-10-CM | POA: Diagnosis not present

## 2020-07-26 DIAGNOSIS — D649 Anemia, unspecified: Secondary | ICD-10-CM | POA: Diagnosis not present

## 2020-07-26 DIAGNOSIS — R296 Repeated falls: Secondary | ICD-10-CM | POA: Diagnosis not present

## 2020-07-26 DIAGNOSIS — F0280 Dementia in other diseases classified elsewhere without behavioral disturbance: Secondary | ICD-10-CM | POA: Diagnosis not present

## 2020-07-26 DIAGNOSIS — G311 Senile degeneration of brain, not elsewhere classified: Secondary | ICD-10-CM | POA: Diagnosis not present

## 2020-07-26 DIAGNOSIS — M4800 Spinal stenosis, site unspecified: Secondary | ICD-10-CM | POA: Diagnosis not present

## 2020-07-26 DIAGNOSIS — I509 Heart failure, unspecified: Secondary | ICD-10-CM | POA: Diagnosis not present

## 2020-07-29 DIAGNOSIS — M4800 Spinal stenosis, site unspecified: Secondary | ICD-10-CM | POA: Diagnosis not present

## 2020-07-29 DIAGNOSIS — D649 Anemia, unspecified: Secondary | ICD-10-CM | POA: Diagnosis not present

## 2020-07-29 DIAGNOSIS — F0280 Dementia in other diseases classified elsewhere without behavioral disturbance: Secondary | ICD-10-CM | POA: Diagnosis not present

## 2020-07-29 DIAGNOSIS — R296 Repeated falls: Secondary | ICD-10-CM | POA: Diagnosis not present

## 2020-07-29 DIAGNOSIS — I509 Heart failure, unspecified: Secondary | ICD-10-CM | POA: Diagnosis not present

## 2020-07-29 DIAGNOSIS — G311 Senile degeneration of brain, not elsewhere classified: Secondary | ICD-10-CM | POA: Diagnosis not present

## 2020-07-30 DIAGNOSIS — G311 Senile degeneration of brain, not elsewhere classified: Secondary | ICD-10-CM | POA: Diagnosis not present

## 2020-07-30 DIAGNOSIS — F0280 Dementia in other diseases classified elsewhere without behavioral disturbance: Secondary | ICD-10-CM | POA: Diagnosis not present

## 2020-07-30 DIAGNOSIS — I509 Heart failure, unspecified: Secondary | ICD-10-CM | POA: Diagnosis not present

## 2020-07-30 DIAGNOSIS — M4800 Spinal stenosis, site unspecified: Secondary | ICD-10-CM | POA: Diagnosis not present

## 2020-07-30 DIAGNOSIS — D649 Anemia, unspecified: Secondary | ICD-10-CM | POA: Diagnosis not present

## 2020-07-30 DIAGNOSIS — R296 Repeated falls: Secondary | ICD-10-CM | POA: Diagnosis not present

## 2020-07-31 DIAGNOSIS — I10 Essential (primary) hypertension: Secondary | ICD-10-CM | POA: Diagnosis not present

## 2020-07-31 DIAGNOSIS — D649 Anemia, unspecified: Secondary | ICD-10-CM | POA: Diagnosis not present

## 2020-07-31 DIAGNOSIS — M4800 Spinal stenosis, site unspecified: Secondary | ICD-10-CM | POA: Diagnosis not present

## 2020-07-31 DIAGNOSIS — F0280 Dementia in other diseases classified elsewhere without behavioral disturbance: Secondary | ICD-10-CM | POA: Diagnosis not present

## 2020-07-31 DIAGNOSIS — Z515 Encounter for palliative care: Secondary | ICD-10-CM | POA: Diagnosis not present

## 2020-07-31 DIAGNOSIS — G311 Senile degeneration of brain, not elsewhere classified: Secondary | ICD-10-CM | POA: Diagnosis not present

## 2020-07-31 DIAGNOSIS — R296 Repeated falls: Secondary | ICD-10-CM | POA: Diagnosis not present

## 2020-07-31 DIAGNOSIS — I509 Heart failure, unspecified: Secondary | ICD-10-CM | POA: Diagnosis not present

## 2020-08-01 DIAGNOSIS — I509 Heart failure, unspecified: Secondary | ICD-10-CM | POA: Diagnosis not present

## 2020-08-01 DIAGNOSIS — D649 Anemia, unspecified: Secondary | ICD-10-CM | POA: Diagnosis not present

## 2020-08-01 DIAGNOSIS — F0280 Dementia in other diseases classified elsewhere without behavioral disturbance: Secondary | ICD-10-CM | POA: Diagnosis not present

## 2020-08-01 DIAGNOSIS — M4800 Spinal stenosis, site unspecified: Secondary | ICD-10-CM | POA: Diagnosis not present

## 2020-08-01 DIAGNOSIS — R296 Repeated falls: Secondary | ICD-10-CM | POA: Diagnosis not present

## 2020-08-01 DIAGNOSIS — G311 Senile degeneration of brain, not elsewhere classified: Secondary | ICD-10-CM | POA: Diagnosis not present

## 2020-08-02 DIAGNOSIS — R296 Repeated falls: Secondary | ICD-10-CM | POA: Diagnosis not present

## 2020-08-02 DIAGNOSIS — M4800 Spinal stenosis, site unspecified: Secondary | ICD-10-CM | POA: Diagnosis not present

## 2020-08-02 DIAGNOSIS — D649 Anemia, unspecified: Secondary | ICD-10-CM | POA: Diagnosis not present

## 2020-08-02 DIAGNOSIS — G311 Senile degeneration of brain, not elsewhere classified: Secondary | ICD-10-CM | POA: Diagnosis not present

## 2020-08-02 DIAGNOSIS — I509 Heart failure, unspecified: Secondary | ICD-10-CM | POA: Diagnosis not present

## 2020-08-02 DIAGNOSIS — F0280 Dementia in other diseases classified elsewhere without behavioral disturbance: Secondary | ICD-10-CM | POA: Diagnosis not present

## 2020-08-05 DIAGNOSIS — I509 Heart failure, unspecified: Secondary | ICD-10-CM | POA: Diagnosis not present

## 2020-08-05 DIAGNOSIS — F0280 Dementia in other diseases classified elsewhere without behavioral disturbance: Secondary | ICD-10-CM | POA: Diagnosis not present

## 2020-08-05 DIAGNOSIS — M4800 Spinal stenosis, site unspecified: Secondary | ICD-10-CM | POA: Diagnosis not present

## 2020-08-05 DIAGNOSIS — R296 Repeated falls: Secondary | ICD-10-CM | POA: Diagnosis not present

## 2020-08-05 DIAGNOSIS — G311 Senile degeneration of brain, not elsewhere classified: Secondary | ICD-10-CM | POA: Diagnosis not present

## 2020-08-05 DIAGNOSIS — D649 Anemia, unspecified: Secondary | ICD-10-CM | POA: Diagnosis not present

## 2020-08-06 DIAGNOSIS — D649 Anemia, unspecified: Secondary | ICD-10-CM | POA: Diagnosis not present

## 2020-08-06 DIAGNOSIS — F0280 Dementia in other diseases classified elsewhere without behavioral disturbance: Secondary | ICD-10-CM | POA: Diagnosis not present

## 2020-08-06 DIAGNOSIS — R296 Repeated falls: Secondary | ICD-10-CM | POA: Diagnosis not present

## 2020-08-06 DIAGNOSIS — M4800 Spinal stenosis, site unspecified: Secondary | ICD-10-CM | POA: Diagnosis not present

## 2020-08-06 DIAGNOSIS — G311 Senile degeneration of brain, not elsewhere classified: Secondary | ICD-10-CM | POA: Diagnosis not present

## 2020-08-06 DIAGNOSIS — I509 Heart failure, unspecified: Secondary | ICD-10-CM | POA: Diagnosis not present

## 2020-08-07 DIAGNOSIS — F0280 Dementia in other diseases classified elsewhere without behavioral disturbance: Secondary | ICD-10-CM | POA: Diagnosis not present

## 2020-08-07 DIAGNOSIS — G311 Senile degeneration of brain, not elsewhere classified: Secondary | ICD-10-CM | POA: Diagnosis not present

## 2020-08-07 DIAGNOSIS — I509 Heart failure, unspecified: Secondary | ICD-10-CM | POA: Diagnosis not present

## 2020-08-07 DIAGNOSIS — R296 Repeated falls: Secondary | ICD-10-CM | POA: Diagnosis not present

## 2020-08-07 DIAGNOSIS — M4800 Spinal stenosis, site unspecified: Secondary | ICD-10-CM | POA: Diagnosis not present

## 2020-08-07 DIAGNOSIS — D649 Anemia, unspecified: Secondary | ICD-10-CM | POA: Diagnosis not present

## 2020-08-08 DIAGNOSIS — G311 Senile degeneration of brain, not elsewhere classified: Secondary | ICD-10-CM | POA: Diagnosis not present

## 2020-08-08 DIAGNOSIS — I509 Heart failure, unspecified: Secondary | ICD-10-CM | POA: Diagnosis not present

## 2020-08-08 DIAGNOSIS — D649 Anemia, unspecified: Secondary | ICD-10-CM | POA: Diagnosis not present

## 2020-08-08 DIAGNOSIS — M4800 Spinal stenosis, site unspecified: Secondary | ICD-10-CM | POA: Diagnosis not present

## 2020-08-08 DIAGNOSIS — R296 Repeated falls: Secondary | ICD-10-CM | POA: Diagnosis not present

## 2020-08-08 DIAGNOSIS — F0280 Dementia in other diseases classified elsewhere without behavioral disturbance: Secondary | ICD-10-CM | POA: Diagnosis not present

## 2020-08-09 DIAGNOSIS — M4800 Spinal stenosis, site unspecified: Secondary | ICD-10-CM | POA: Diagnosis not present

## 2020-08-09 DIAGNOSIS — G311 Senile degeneration of brain, not elsewhere classified: Secondary | ICD-10-CM | POA: Diagnosis not present

## 2020-08-09 DIAGNOSIS — R296 Repeated falls: Secondary | ICD-10-CM | POA: Diagnosis not present

## 2020-08-09 DIAGNOSIS — I509 Heart failure, unspecified: Secondary | ICD-10-CM | POA: Diagnosis not present

## 2020-08-09 DIAGNOSIS — D649 Anemia, unspecified: Secondary | ICD-10-CM | POA: Diagnosis not present

## 2020-08-09 DIAGNOSIS — F0280 Dementia in other diseases classified elsewhere without behavioral disturbance: Secondary | ICD-10-CM | POA: Diagnosis not present

## 2020-08-12 DIAGNOSIS — D649 Anemia, unspecified: Secondary | ICD-10-CM | POA: Diagnosis not present

## 2020-08-12 DIAGNOSIS — G311 Senile degeneration of brain, not elsewhere classified: Secondary | ICD-10-CM | POA: Diagnosis not present

## 2020-08-12 DIAGNOSIS — I509 Heart failure, unspecified: Secondary | ICD-10-CM | POA: Diagnosis not present

## 2020-08-12 DIAGNOSIS — F0280 Dementia in other diseases classified elsewhere without behavioral disturbance: Secondary | ICD-10-CM | POA: Diagnosis not present

## 2020-08-12 DIAGNOSIS — R296 Repeated falls: Secondary | ICD-10-CM | POA: Diagnosis not present

## 2020-08-12 DIAGNOSIS — M4800 Spinal stenosis, site unspecified: Secondary | ICD-10-CM | POA: Diagnosis not present

## 2020-08-13 DIAGNOSIS — I509 Heart failure, unspecified: Secondary | ICD-10-CM | POA: Diagnosis not present

## 2020-08-13 DIAGNOSIS — F0280 Dementia in other diseases classified elsewhere without behavioral disturbance: Secondary | ICD-10-CM | POA: Diagnosis not present

## 2020-08-13 DIAGNOSIS — G311 Senile degeneration of brain, not elsewhere classified: Secondary | ICD-10-CM | POA: Diagnosis not present

## 2020-08-13 DIAGNOSIS — R296 Repeated falls: Secondary | ICD-10-CM | POA: Diagnosis not present

## 2020-08-13 DIAGNOSIS — M4800 Spinal stenosis, site unspecified: Secondary | ICD-10-CM | POA: Diagnosis not present

## 2020-08-13 DIAGNOSIS — D649 Anemia, unspecified: Secondary | ICD-10-CM | POA: Diagnosis not present

## 2020-08-14 DIAGNOSIS — M4800 Spinal stenosis, site unspecified: Secondary | ICD-10-CM | POA: Diagnosis not present

## 2020-08-14 DIAGNOSIS — F0280 Dementia in other diseases classified elsewhere without behavioral disturbance: Secondary | ICD-10-CM | POA: Diagnosis not present

## 2020-08-14 DIAGNOSIS — R296 Repeated falls: Secondary | ICD-10-CM | POA: Diagnosis not present

## 2020-08-14 DIAGNOSIS — I509 Heart failure, unspecified: Secondary | ICD-10-CM | POA: Diagnosis not present

## 2020-08-14 DIAGNOSIS — G311 Senile degeneration of brain, not elsewhere classified: Secondary | ICD-10-CM | POA: Diagnosis not present

## 2020-08-14 DIAGNOSIS — D649 Anemia, unspecified: Secondary | ICD-10-CM | POA: Diagnosis not present

## 2020-08-15 DIAGNOSIS — D649 Anemia, unspecified: Secondary | ICD-10-CM | POA: Diagnosis not present

## 2020-08-15 DIAGNOSIS — R296 Repeated falls: Secondary | ICD-10-CM | POA: Diagnosis not present

## 2020-08-15 DIAGNOSIS — I509 Heart failure, unspecified: Secondary | ICD-10-CM | POA: Diagnosis not present

## 2020-08-15 DIAGNOSIS — G311 Senile degeneration of brain, not elsewhere classified: Secondary | ICD-10-CM | POA: Diagnosis not present

## 2020-08-15 DIAGNOSIS — F0280 Dementia in other diseases classified elsewhere without behavioral disturbance: Secondary | ICD-10-CM | POA: Diagnosis not present

## 2020-08-15 DIAGNOSIS — M4800 Spinal stenosis, site unspecified: Secondary | ICD-10-CM | POA: Diagnosis not present

## 2020-08-16 DIAGNOSIS — R296 Repeated falls: Secondary | ICD-10-CM | POA: Diagnosis not present

## 2020-08-16 DIAGNOSIS — F0280 Dementia in other diseases classified elsewhere without behavioral disturbance: Secondary | ICD-10-CM | POA: Diagnosis not present

## 2020-08-16 DIAGNOSIS — D649 Anemia, unspecified: Secondary | ICD-10-CM | POA: Diagnosis not present

## 2020-08-16 DIAGNOSIS — I509 Heart failure, unspecified: Secondary | ICD-10-CM | POA: Diagnosis not present

## 2020-08-16 DIAGNOSIS — G311 Senile degeneration of brain, not elsewhere classified: Secondary | ICD-10-CM | POA: Diagnosis not present

## 2020-08-16 DIAGNOSIS — M4800 Spinal stenosis, site unspecified: Secondary | ICD-10-CM | POA: Diagnosis not present

## 2020-08-17 DIAGNOSIS — M4800 Spinal stenosis, site unspecified: Secondary | ICD-10-CM | POA: Diagnosis not present

## 2020-08-17 DIAGNOSIS — I509 Heart failure, unspecified: Secondary | ICD-10-CM | POA: Diagnosis not present

## 2020-08-17 DIAGNOSIS — F0280 Dementia in other diseases classified elsewhere without behavioral disturbance: Secondary | ICD-10-CM | POA: Diagnosis not present

## 2020-08-17 DIAGNOSIS — D649 Anemia, unspecified: Secondary | ICD-10-CM | POA: Diagnosis not present

## 2020-08-17 DIAGNOSIS — R296 Repeated falls: Secondary | ICD-10-CM | POA: Diagnosis not present

## 2020-08-17 DIAGNOSIS — G311 Senile degeneration of brain, not elsewhere classified: Secondary | ICD-10-CM | POA: Diagnosis not present

## 2020-08-19 DIAGNOSIS — I509 Heart failure, unspecified: Secondary | ICD-10-CM | POA: Diagnosis not present

## 2020-08-19 DIAGNOSIS — M4800 Spinal stenosis, site unspecified: Secondary | ICD-10-CM | POA: Diagnosis not present

## 2020-08-19 DIAGNOSIS — G311 Senile degeneration of brain, not elsewhere classified: Secondary | ICD-10-CM | POA: Diagnosis not present

## 2020-08-19 DIAGNOSIS — F0280 Dementia in other diseases classified elsewhere without behavioral disturbance: Secondary | ICD-10-CM | POA: Diagnosis not present

## 2020-08-19 DIAGNOSIS — R296 Repeated falls: Secondary | ICD-10-CM | POA: Diagnosis not present

## 2020-08-19 DIAGNOSIS — D649 Anemia, unspecified: Secondary | ICD-10-CM | POA: Diagnosis not present

## 2020-08-20 DIAGNOSIS — G311 Senile degeneration of brain, not elsewhere classified: Secondary | ICD-10-CM | POA: Diagnosis not present

## 2020-08-20 DIAGNOSIS — D649 Anemia, unspecified: Secondary | ICD-10-CM | POA: Diagnosis not present

## 2020-08-20 DIAGNOSIS — M4800 Spinal stenosis, site unspecified: Secondary | ICD-10-CM | POA: Diagnosis not present

## 2020-08-20 DIAGNOSIS — I509 Heart failure, unspecified: Secondary | ICD-10-CM | POA: Diagnosis not present

## 2020-08-20 DIAGNOSIS — F0280 Dementia in other diseases classified elsewhere without behavioral disturbance: Secondary | ICD-10-CM | POA: Diagnosis not present

## 2020-08-20 DIAGNOSIS — R296 Repeated falls: Secondary | ICD-10-CM | POA: Diagnosis not present

## 2020-08-21 DIAGNOSIS — I509 Heart failure, unspecified: Secondary | ICD-10-CM | POA: Diagnosis not present

## 2020-08-21 DIAGNOSIS — F0280 Dementia in other diseases classified elsewhere without behavioral disturbance: Secondary | ICD-10-CM | POA: Diagnosis not present

## 2020-08-21 DIAGNOSIS — R296 Repeated falls: Secondary | ICD-10-CM | POA: Diagnosis not present

## 2020-08-21 DIAGNOSIS — M4800 Spinal stenosis, site unspecified: Secondary | ICD-10-CM | POA: Diagnosis not present

## 2020-08-21 DIAGNOSIS — D649 Anemia, unspecified: Secondary | ICD-10-CM | POA: Diagnosis not present

## 2020-08-21 DIAGNOSIS — G311 Senile degeneration of brain, not elsewhere classified: Secondary | ICD-10-CM | POA: Diagnosis not present

## 2020-08-22 DIAGNOSIS — M4800 Spinal stenosis, site unspecified: Secondary | ICD-10-CM | POA: Diagnosis not present

## 2020-08-22 DIAGNOSIS — D649 Anemia, unspecified: Secondary | ICD-10-CM | POA: Diagnosis not present

## 2020-08-22 DIAGNOSIS — I509 Heart failure, unspecified: Secondary | ICD-10-CM | POA: Diagnosis not present

## 2020-08-22 DIAGNOSIS — F0280 Dementia in other diseases classified elsewhere without behavioral disturbance: Secondary | ICD-10-CM | POA: Diagnosis not present

## 2020-08-22 DIAGNOSIS — R296 Repeated falls: Secondary | ICD-10-CM | POA: Diagnosis not present

## 2020-08-22 DIAGNOSIS — G311 Senile degeneration of brain, not elsewhere classified: Secondary | ICD-10-CM | POA: Diagnosis not present

## 2020-08-23 DIAGNOSIS — F0280 Dementia in other diseases classified elsewhere without behavioral disturbance: Secondary | ICD-10-CM | POA: Diagnosis not present

## 2020-08-23 DIAGNOSIS — G311 Senile degeneration of brain, not elsewhere classified: Secondary | ICD-10-CM | POA: Diagnosis not present

## 2020-08-23 DIAGNOSIS — R296 Repeated falls: Secondary | ICD-10-CM | POA: Diagnosis not present

## 2020-08-23 DIAGNOSIS — D649 Anemia, unspecified: Secondary | ICD-10-CM | POA: Diagnosis not present

## 2020-08-23 DIAGNOSIS — M4800 Spinal stenosis, site unspecified: Secondary | ICD-10-CM | POA: Diagnosis not present

## 2020-08-23 DIAGNOSIS — I509 Heart failure, unspecified: Secondary | ICD-10-CM | POA: Diagnosis not present

## 2020-08-26 DIAGNOSIS — R296 Repeated falls: Secondary | ICD-10-CM | POA: Diagnosis not present

## 2020-08-26 DIAGNOSIS — F0280 Dementia in other diseases classified elsewhere without behavioral disturbance: Secondary | ICD-10-CM | POA: Diagnosis not present

## 2020-08-26 DIAGNOSIS — M4800 Spinal stenosis, site unspecified: Secondary | ICD-10-CM | POA: Diagnosis not present

## 2020-08-26 DIAGNOSIS — D649 Anemia, unspecified: Secondary | ICD-10-CM | POA: Diagnosis not present

## 2020-08-26 DIAGNOSIS — G311 Senile degeneration of brain, not elsewhere classified: Secondary | ICD-10-CM | POA: Diagnosis not present

## 2020-08-26 DIAGNOSIS — I509 Heart failure, unspecified: Secondary | ICD-10-CM | POA: Diagnosis not present

## 2020-08-27 DIAGNOSIS — D649 Anemia, unspecified: Secondary | ICD-10-CM | POA: Diagnosis not present

## 2020-08-27 DIAGNOSIS — M4800 Spinal stenosis, site unspecified: Secondary | ICD-10-CM | POA: Diagnosis not present

## 2020-08-27 DIAGNOSIS — G311 Senile degeneration of brain, not elsewhere classified: Secondary | ICD-10-CM | POA: Diagnosis not present

## 2020-08-27 DIAGNOSIS — I509 Heart failure, unspecified: Secondary | ICD-10-CM | POA: Diagnosis not present

## 2020-08-27 DIAGNOSIS — F0280 Dementia in other diseases classified elsewhere without behavioral disturbance: Secondary | ICD-10-CM | POA: Diagnosis not present

## 2020-08-27 DIAGNOSIS — R296 Repeated falls: Secondary | ICD-10-CM | POA: Diagnosis not present

## 2020-08-28 DIAGNOSIS — G311 Senile degeneration of brain, not elsewhere classified: Secondary | ICD-10-CM | POA: Diagnosis not present

## 2020-08-28 DIAGNOSIS — I509 Heart failure, unspecified: Secondary | ICD-10-CM | POA: Diagnosis not present

## 2020-08-28 DIAGNOSIS — F0280 Dementia in other diseases classified elsewhere without behavioral disturbance: Secondary | ICD-10-CM | POA: Diagnosis not present

## 2020-08-28 DIAGNOSIS — D649 Anemia, unspecified: Secondary | ICD-10-CM | POA: Diagnosis not present

## 2020-08-28 DIAGNOSIS — M4800 Spinal stenosis, site unspecified: Secondary | ICD-10-CM | POA: Diagnosis not present

## 2020-08-28 DIAGNOSIS — R296 Repeated falls: Secondary | ICD-10-CM | POA: Diagnosis not present

## 2020-08-29 DIAGNOSIS — M4800 Spinal stenosis, site unspecified: Secondary | ICD-10-CM | POA: Diagnosis not present

## 2020-08-29 DIAGNOSIS — G311 Senile degeneration of brain, not elsewhere classified: Secondary | ICD-10-CM | POA: Diagnosis not present

## 2020-08-29 DIAGNOSIS — D649 Anemia, unspecified: Secondary | ICD-10-CM | POA: Diagnosis not present

## 2020-08-29 DIAGNOSIS — I509 Heart failure, unspecified: Secondary | ICD-10-CM | POA: Diagnosis not present

## 2020-08-29 DIAGNOSIS — R296 Repeated falls: Secondary | ICD-10-CM | POA: Diagnosis not present

## 2020-08-29 DIAGNOSIS — F0280 Dementia in other diseases classified elsewhere without behavioral disturbance: Secondary | ICD-10-CM | POA: Diagnosis not present

## 2020-08-30 DIAGNOSIS — M4800 Spinal stenosis, site unspecified: Secondary | ICD-10-CM | POA: Diagnosis not present

## 2020-08-30 DIAGNOSIS — I509 Heart failure, unspecified: Secondary | ICD-10-CM | POA: Diagnosis not present

## 2020-08-30 DIAGNOSIS — F0280 Dementia in other diseases classified elsewhere without behavioral disturbance: Secondary | ICD-10-CM | POA: Diagnosis not present

## 2020-08-30 DIAGNOSIS — R296 Repeated falls: Secondary | ICD-10-CM | POA: Diagnosis not present

## 2020-08-30 DIAGNOSIS — G311 Senile degeneration of brain, not elsewhere classified: Secondary | ICD-10-CM | POA: Diagnosis not present

## 2020-08-30 DIAGNOSIS — D649 Anemia, unspecified: Secondary | ICD-10-CM | POA: Diagnosis not present

## 2020-08-31 DIAGNOSIS — R296 Repeated falls: Secondary | ICD-10-CM | POA: Diagnosis not present

## 2020-08-31 DIAGNOSIS — F0280 Dementia in other diseases classified elsewhere without behavioral disturbance: Secondary | ICD-10-CM | POA: Diagnosis not present

## 2020-08-31 DIAGNOSIS — Z515 Encounter for palliative care: Secondary | ICD-10-CM | POA: Diagnosis not present

## 2020-08-31 DIAGNOSIS — M4800 Spinal stenosis, site unspecified: Secondary | ICD-10-CM | POA: Diagnosis not present

## 2020-08-31 DIAGNOSIS — G311 Senile degeneration of brain, not elsewhere classified: Secondary | ICD-10-CM | POA: Diagnosis not present

## 2020-08-31 DIAGNOSIS — D649 Anemia, unspecified: Secondary | ICD-10-CM | POA: Diagnosis not present

## 2020-08-31 DIAGNOSIS — I509 Heart failure, unspecified: Secondary | ICD-10-CM | POA: Diagnosis not present

## 2020-08-31 DIAGNOSIS — I10 Essential (primary) hypertension: Secondary | ICD-10-CM | POA: Diagnosis not present

## 2020-09-01 DIAGNOSIS — R296 Repeated falls: Secondary | ICD-10-CM | POA: Diagnosis not present

## 2020-09-01 DIAGNOSIS — D649 Anemia, unspecified: Secondary | ICD-10-CM | POA: Diagnosis not present

## 2020-09-01 DIAGNOSIS — M4800 Spinal stenosis, site unspecified: Secondary | ICD-10-CM | POA: Diagnosis not present

## 2020-09-01 DIAGNOSIS — G311 Senile degeneration of brain, not elsewhere classified: Secondary | ICD-10-CM | POA: Diagnosis not present

## 2020-09-01 DIAGNOSIS — I509 Heart failure, unspecified: Secondary | ICD-10-CM | POA: Diagnosis not present

## 2020-09-01 DIAGNOSIS — F0280 Dementia in other diseases classified elsewhere without behavioral disturbance: Secondary | ICD-10-CM | POA: Diagnosis not present

## 2020-09-03 DIAGNOSIS — M4800 Spinal stenosis, site unspecified: Secondary | ICD-10-CM | POA: Diagnosis not present

## 2020-09-03 DIAGNOSIS — R296 Repeated falls: Secondary | ICD-10-CM | POA: Diagnosis not present

## 2020-09-03 DIAGNOSIS — G311 Senile degeneration of brain, not elsewhere classified: Secondary | ICD-10-CM | POA: Diagnosis not present

## 2020-09-03 DIAGNOSIS — F0280 Dementia in other diseases classified elsewhere without behavioral disturbance: Secondary | ICD-10-CM | POA: Diagnosis not present

## 2020-09-03 DIAGNOSIS — I509 Heart failure, unspecified: Secondary | ICD-10-CM | POA: Diagnosis not present

## 2020-09-03 DIAGNOSIS — D649 Anemia, unspecified: Secondary | ICD-10-CM | POA: Diagnosis not present

## 2020-09-04 DIAGNOSIS — D649 Anemia, unspecified: Secondary | ICD-10-CM | POA: Diagnosis not present

## 2020-09-04 DIAGNOSIS — M4800 Spinal stenosis, site unspecified: Secondary | ICD-10-CM | POA: Diagnosis not present

## 2020-09-04 DIAGNOSIS — G311 Senile degeneration of brain, not elsewhere classified: Secondary | ICD-10-CM | POA: Diagnosis not present

## 2020-09-04 DIAGNOSIS — F0280 Dementia in other diseases classified elsewhere without behavioral disturbance: Secondary | ICD-10-CM | POA: Diagnosis not present

## 2020-09-04 DIAGNOSIS — I509 Heart failure, unspecified: Secondary | ICD-10-CM | POA: Diagnosis not present

## 2020-09-04 DIAGNOSIS — R296 Repeated falls: Secondary | ICD-10-CM | POA: Diagnosis not present

## 2020-09-05 DIAGNOSIS — M4800 Spinal stenosis, site unspecified: Secondary | ICD-10-CM | POA: Diagnosis not present

## 2020-09-05 DIAGNOSIS — D649 Anemia, unspecified: Secondary | ICD-10-CM | POA: Diagnosis not present

## 2020-09-05 DIAGNOSIS — F0280 Dementia in other diseases classified elsewhere without behavioral disturbance: Secondary | ICD-10-CM | POA: Diagnosis not present

## 2020-09-05 DIAGNOSIS — G311 Senile degeneration of brain, not elsewhere classified: Secondary | ICD-10-CM | POA: Diagnosis not present

## 2020-09-05 DIAGNOSIS — I509 Heart failure, unspecified: Secondary | ICD-10-CM | POA: Diagnosis not present

## 2020-09-05 DIAGNOSIS — R296 Repeated falls: Secondary | ICD-10-CM | POA: Diagnosis not present

## 2020-09-06 DIAGNOSIS — F0280 Dementia in other diseases classified elsewhere without behavioral disturbance: Secondary | ICD-10-CM | POA: Diagnosis not present

## 2020-09-06 DIAGNOSIS — R296 Repeated falls: Secondary | ICD-10-CM | POA: Diagnosis not present

## 2020-09-06 DIAGNOSIS — D649 Anemia, unspecified: Secondary | ICD-10-CM | POA: Diagnosis not present

## 2020-09-06 DIAGNOSIS — M4800 Spinal stenosis, site unspecified: Secondary | ICD-10-CM | POA: Diagnosis not present

## 2020-09-06 DIAGNOSIS — I509 Heart failure, unspecified: Secondary | ICD-10-CM | POA: Diagnosis not present

## 2020-09-06 DIAGNOSIS — G311 Senile degeneration of brain, not elsewhere classified: Secondary | ICD-10-CM | POA: Diagnosis not present

## 2020-09-09 DIAGNOSIS — F0280 Dementia in other diseases classified elsewhere without behavioral disturbance: Secondary | ICD-10-CM | POA: Diagnosis not present

## 2020-09-09 DIAGNOSIS — R296 Repeated falls: Secondary | ICD-10-CM | POA: Diagnosis not present

## 2020-09-09 DIAGNOSIS — D649 Anemia, unspecified: Secondary | ICD-10-CM | POA: Diagnosis not present

## 2020-09-09 DIAGNOSIS — G311 Senile degeneration of brain, not elsewhere classified: Secondary | ICD-10-CM | POA: Diagnosis not present

## 2020-09-09 DIAGNOSIS — I509 Heart failure, unspecified: Secondary | ICD-10-CM | POA: Diagnosis not present

## 2020-09-09 DIAGNOSIS — M4800 Spinal stenosis, site unspecified: Secondary | ICD-10-CM | POA: Diagnosis not present

## 2020-09-10 DIAGNOSIS — M4800 Spinal stenosis, site unspecified: Secondary | ICD-10-CM | POA: Diagnosis not present

## 2020-09-10 DIAGNOSIS — D649 Anemia, unspecified: Secondary | ICD-10-CM | POA: Diagnosis not present

## 2020-09-10 DIAGNOSIS — F0280 Dementia in other diseases classified elsewhere without behavioral disturbance: Secondary | ICD-10-CM | POA: Diagnosis not present

## 2020-09-10 DIAGNOSIS — I509 Heart failure, unspecified: Secondary | ICD-10-CM | POA: Diagnosis not present

## 2020-09-10 DIAGNOSIS — G311 Senile degeneration of brain, not elsewhere classified: Secondary | ICD-10-CM | POA: Diagnosis not present

## 2020-09-10 DIAGNOSIS — R296 Repeated falls: Secondary | ICD-10-CM | POA: Diagnosis not present

## 2020-09-11 DIAGNOSIS — F0280 Dementia in other diseases classified elsewhere without behavioral disturbance: Secondary | ICD-10-CM | POA: Diagnosis not present

## 2020-09-11 DIAGNOSIS — R296 Repeated falls: Secondary | ICD-10-CM | POA: Diagnosis not present

## 2020-09-11 DIAGNOSIS — M4800 Spinal stenosis, site unspecified: Secondary | ICD-10-CM | POA: Diagnosis not present

## 2020-09-11 DIAGNOSIS — G311 Senile degeneration of brain, not elsewhere classified: Secondary | ICD-10-CM | POA: Diagnosis not present

## 2020-09-11 DIAGNOSIS — D649 Anemia, unspecified: Secondary | ICD-10-CM | POA: Diagnosis not present

## 2020-09-11 DIAGNOSIS — I509 Heart failure, unspecified: Secondary | ICD-10-CM | POA: Diagnosis not present

## 2020-09-12 DIAGNOSIS — G311 Senile degeneration of brain, not elsewhere classified: Secondary | ICD-10-CM | POA: Diagnosis not present

## 2020-09-12 DIAGNOSIS — D649 Anemia, unspecified: Secondary | ICD-10-CM | POA: Diagnosis not present

## 2020-09-12 DIAGNOSIS — M4800 Spinal stenosis, site unspecified: Secondary | ICD-10-CM | POA: Diagnosis not present

## 2020-09-12 DIAGNOSIS — I509 Heart failure, unspecified: Secondary | ICD-10-CM | POA: Diagnosis not present

## 2020-09-12 DIAGNOSIS — F0280 Dementia in other diseases classified elsewhere without behavioral disturbance: Secondary | ICD-10-CM | POA: Diagnosis not present

## 2020-09-12 DIAGNOSIS — R296 Repeated falls: Secondary | ICD-10-CM | POA: Diagnosis not present

## 2020-09-13 DIAGNOSIS — I509 Heart failure, unspecified: Secondary | ICD-10-CM | POA: Diagnosis not present

## 2020-09-13 DIAGNOSIS — G311 Senile degeneration of brain, not elsewhere classified: Secondary | ICD-10-CM | POA: Diagnosis not present

## 2020-09-13 DIAGNOSIS — M4800 Spinal stenosis, site unspecified: Secondary | ICD-10-CM | POA: Diagnosis not present

## 2020-09-13 DIAGNOSIS — R296 Repeated falls: Secondary | ICD-10-CM | POA: Diagnosis not present

## 2020-09-13 DIAGNOSIS — F0280 Dementia in other diseases classified elsewhere without behavioral disturbance: Secondary | ICD-10-CM | POA: Diagnosis not present

## 2020-09-13 DIAGNOSIS — D649 Anemia, unspecified: Secondary | ICD-10-CM | POA: Diagnosis not present

## 2020-09-14 DIAGNOSIS — D649 Anemia, unspecified: Secondary | ICD-10-CM | POA: Diagnosis not present

## 2020-09-14 DIAGNOSIS — I509 Heart failure, unspecified: Secondary | ICD-10-CM | POA: Diagnosis not present

## 2020-09-14 DIAGNOSIS — M4800 Spinal stenosis, site unspecified: Secondary | ICD-10-CM | POA: Diagnosis not present

## 2020-09-14 DIAGNOSIS — R296 Repeated falls: Secondary | ICD-10-CM | POA: Diagnosis not present

## 2020-09-14 DIAGNOSIS — F0280 Dementia in other diseases classified elsewhere without behavioral disturbance: Secondary | ICD-10-CM | POA: Diagnosis not present

## 2020-09-14 DIAGNOSIS — G311 Senile degeneration of brain, not elsewhere classified: Secondary | ICD-10-CM | POA: Diagnosis not present

## 2020-09-18 DIAGNOSIS — I509 Heart failure, unspecified: Secondary | ICD-10-CM | POA: Diagnosis not present

## 2020-09-18 DIAGNOSIS — R296 Repeated falls: Secondary | ICD-10-CM | POA: Diagnosis not present

## 2020-09-18 DIAGNOSIS — D649 Anemia, unspecified: Secondary | ICD-10-CM | POA: Diagnosis not present

## 2020-09-18 DIAGNOSIS — F0280 Dementia in other diseases classified elsewhere without behavioral disturbance: Secondary | ICD-10-CM | POA: Diagnosis not present

## 2020-09-18 DIAGNOSIS — G311 Senile degeneration of brain, not elsewhere classified: Secondary | ICD-10-CM | POA: Diagnosis not present

## 2020-09-18 DIAGNOSIS — M4800 Spinal stenosis, site unspecified: Secondary | ICD-10-CM | POA: Diagnosis not present

## 2020-09-19 DIAGNOSIS — I509 Heart failure, unspecified: Secondary | ICD-10-CM | POA: Diagnosis not present

## 2020-09-19 DIAGNOSIS — D649 Anemia, unspecified: Secondary | ICD-10-CM | POA: Diagnosis not present

## 2020-09-19 DIAGNOSIS — G311 Senile degeneration of brain, not elsewhere classified: Secondary | ICD-10-CM | POA: Diagnosis not present

## 2020-09-19 DIAGNOSIS — F0280 Dementia in other diseases classified elsewhere without behavioral disturbance: Secondary | ICD-10-CM | POA: Diagnosis not present

## 2020-09-19 DIAGNOSIS — R296 Repeated falls: Secondary | ICD-10-CM | POA: Diagnosis not present

## 2020-09-19 DIAGNOSIS — M4800 Spinal stenosis, site unspecified: Secondary | ICD-10-CM | POA: Diagnosis not present

## 2020-09-20 DIAGNOSIS — M4800 Spinal stenosis, site unspecified: Secondary | ICD-10-CM | POA: Diagnosis not present

## 2020-09-20 DIAGNOSIS — R296 Repeated falls: Secondary | ICD-10-CM | POA: Diagnosis not present

## 2020-09-20 DIAGNOSIS — G311 Senile degeneration of brain, not elsewhere classified: Secondary | ICD-10-CM | POA: Diagnosis not present

## 2020-09-20 DIAGNOSIS — F0280 Dementia in other diseases classified elsewhere without behavioral disturbance: Secondary | ICD-10-CM | POA: Diagnosis not present

## 2020-09-20 DIAGNOSIS — I509 Heart failure, unspecified: Secondary | ICD-10-CM | POA: Diagnosis not present

## 2020-09-20 DIAGNOSIS — D649 Anemia, unspecified: Secondary | ICD-10-CM | POA: Diagnosis not present

## 2020-09-23 DIAGNOSIS — F0280 Dementia in other diseases classified elsewhere without behavioral disturbance: Secondary | ICD-10-CM | POA: Diagnosis not present

## 2020-09-23 DIAGNOSIS — G311 Senile degeneration of brain, not elsewhere classified: Secondary | ICD-10-CM | POA: Diagnosis not present

## 2020-09-23 DIAGNOSIS — D649 Anemia, unspecified: Secondary | ICD-10-CM | POA: Diagnosis not present

## 2020-09-23 DIAGNOSIS — R296 Repeated falls: Secondary | ICD-10-CM | POA: Diagnosis not present

## 2020-09-23 DIAGNOSIS — M4800 Spinal stenosis, site unspecified: Secondary | ICD-10-CM | POA: Diagnosis not present

## 2020-09-23 DIAGNOSIS — I509 Heart failure, unspecified: Secondary | ICD-10-CM | POA: Diagnosis not present

## 2020-09-24 DIAGNOSIS — F0280 Dementia in other diseases classified elsewhere without behavioral disturbance: Secondary | ICD-10-CM | POA: Diagnosis not present

## 2020-09-24 DIAGNOSIS — R296 Repeated falls: Secondary | ICD-10-CM | POA: Diagnosis not present

## 2020-09-24 DIAGNOSIS — D649 Anemia, unspecified: Secondary | ICD-10-CM | POA: Diagnosis not present

## 2020-09-24 DIAGNOSIS — M4800 Spinal stenosis, site unspecified: Secondary | ICD-10-CM | POA: Diagnosis not present

## 2020-09-24 DIAGNOSIS — G311 Senile degeneration of brain, not elsewhere classified: Secondary | ICD-10-CM | POA: Diagnosis not present

## 2020-09-24 DIAGNOSIS — I509 Heart failure, unspecified: Secondary | ICD-10-CM | POA: Diagnosis not present

## 2020-09-25 DIAGNOSIS — R296 Repeated falls: Secondary | ICD-10-CM | POA: Diagnosis not present

## 2020-09-25 DIAGNOSIS — D649 Anemia, unspecified: Secondary | ICD-10-CM | POA: Diagnosis not present

## 2020-09-25 DIAGNOSIS — F0280 Dementia in other diseases classified elsewhere without behavioral disturbance: Secondary | ICD-10-CM | POA: Diagnosis not present

## 2020-09-25 DIAGNOSIS — G311 Senile degeneration of brain, not elsewhere classified: Secondary | ICD-10-CM | POA: Diagnosis not present

## 2020-09-25 DIAGNOSIS — M4800 Spinal stenosis, site unspecified: Secondary | ICD-10-CM | POA: Diagnosis not present

## 2020-09-25 DIAGNOSIS — I509 Heart failure, unspecified: Secondary | ICD-10-CM | POA: Diagnosis not present

## 2020-09-26 DIAGNOSIS — G311 Senile degeneration of brain, not elsewhere classified: Secondary | ICD-10-CM | POA: Diagnosis not present

## 2020-09-26 DIAGNOSIS — I509 Heart failure, unspecified: Secondary | ICD-10-CM | POA: Diagnosis not present

## 2020-09-26 DIAGNOSIS — F0280 Dementia in other diseases classified elsewhere without behavioral disturbance: Secondary | ICD-10-CM | POA: Diagnosis not present

## 2020-09-26 DIAGNOSIS — R296 Repeated falls: Secondary | ICD-10-CM | POA: Diagnosis not present

## 2020-09-26 DIAGNOSIS — D649 Anemia, unspecified: Secondary | ICD-10-CM | POA: Diagnosis not present

## 2020-09-26 DIAGNOSIS — M4800 Spinal stenosis, site unspecified: Secondary | ICD-10-CM | POA: Diagnosis not present

## 2020-09-30 DIAGNOSIS — M4800 Spinal stenosis, site unspecified: Secondary | ICD-10-CM | POA: Diagnosis not present

## 2020-09-30 DIAGNOSIS — D649 Anemia, unspecified: Secondary | ICD-10-CM | POA: Diagnosis not present

## 2020-09-30 DIAGNOSIS — G311 Senile degeneration of brain, not elsewhere classified: Secondary | ICD-10-CM | POA: Diagnosis not present

## 2020-09-30 DIAGNOSIS — I509 Heart failure, unspecified: Secondary | ICD-10-CM | POA: Diagnosis not present

## 2020-09-30 DIAGNOSIS — F0280 Dementia in other diseases classified elsewhere without behavioral disturbance: Secondary | ICD-10-CM | POA: Diagnosis not present

## 2020-09-30 DIAGNOSIS — R296 Repeated falls: Secondary | ICD-10-CM | POA: Diagnosis not present

## 2020-10-01 DIAGNOSIS — M4800 Spinal stenosis, site unspecified: Secondary | ICD-10-CM | POA: Diagnosis not present

## 2020-10-01 DIAGNOSIS — I509 Heart failure, unspecified: Secondary | ICD-10-CM | POA: Diagnosis not present

## 2020-10-01 DIAGNOSIS — D649 Anemia, unspecified: Secondary | ICD-10-CM | POA: Diagnosis not present

## 2020-10-01 DIAGNOSIS — R296 Repeated falls: Secondary | ICD-10-CM | POA: Diagnosis not present

## 2020-10-01 DIAGNOSIS — F0280 Dementia in other diseases classified elsewhere without behavioral disturbance: Secondary | ICD-10-CM | POA: Diagnosis not present

## 2020-10-01 DIAGNOSIS — I10 Essential (primary) hypertension: Secondary | ICD-10-CM | POA: Diagnosis not present

## 2020-10-01 DIAGNOSIS — G311 Senile degeneration of brain, not elsewhere classified: Secondary | ICD-10-CM | POA: Diagnosis not present

## 2020-10-01 DIAGNOSIS — Z515 Encounter for palliative care: Secondary | ICD-10-CM | POA: Diagnosis not present

## 2020-10-02 DIAGNOSIS — I509 Heart failure, unspecified: Secondary | ICD-10-CM | POA: Diagnosis not present

## 2020-10-02 DIAGNOSIS — G311 Senile degeneration of brain, not elsewhere classified: Secondary | ICD-10-CM | POA: Diagnosis not present

## 2020-10-02 DIAGNOSIS — R296 Repeated falls: Secondary | ICD-10-CM | POA: Diagnosis not present

## 2020-10-02 DIAGNOSIS — D649 Anemia, unspecified: Secondary | ICD-10-CM | POA: Diagnosis not present

## 2020-10-02 DIAGNOSIS — M4800 Spinal stenosis, site unspecified: Secondary | ICD-10-CM | POA: Diagnosis not present

## 2020-10-02 DIAGNOSIS — F0280 Dementia in other diseases classified elsewhere without behavioral disturbance: Secondary | ICD-10-CM | POA: Diagnosis not present

## 2020-10-03 DIAGNOSIS — D649 Anemia, unspecified: Secondary | ICD-10-CM | POA: Diagnosis not present

## 2020-10-03 DIAGNOSIS — G311 Senile degeneration of brain, not elsewhere classified: Secondary | ICD-10-CM | POA: Diagnosis not present

## 2020-10-03 DIAGNOSIS — I509 Heart failure, unspecified: Secondary | ICD-10-CM | POA: Diagnosis not present

## 2020-10-03 DIAGNOSIS — F0280 Dementia in other diseases classified elsewhere without behavioral disturbance: Secondary | ICD-10-CM | POA: Diagnosis not present

## 2020-10-03 DIAGNOSIS — R296 Repeated falls: Secondary | ICD-10-CM | POA: Diagnosis not present

## 2020-10-03 DIAGNOSIS — M4800 Spinal stenosis, site unspecified: Secondary | ICD-10-CM | POA: Diagnosis not present

## 2020-10-04 DIAGNOSIS — M4800 Spinal stenosis, site unspecified: Secondary | ICD-10-CM | POA: Diagnosis not present

## 2020-10-04 DIAGNOSIS — F0280 Dementia in other diseases classified elsewhere without behavioral disturbance: Secondary | ICD-10-CM | POA: Diagnosis not present

## 2020-10-04 DIAGNOSIS — D649 Anemia, unspecified: Secondary | ICD-10-CM | POA: Diagnosis not present

## 2020-10-04 DIAGNOSIS — I509 Heart failure, unspecified: Secondary | ICD-10-CM | POA: Diagnosis not present

## 2020-10-04 DIAGNOSIS — R296 Repeated falls: Secondary | ICD-10-CM | POA: Diagnosis not present

## 2020-10-04 DIAGNOSIS — G311 Senile degeneration of brain, not elsewhere classified: Secondary | ICD-10-CM | POA: Diagnosis not present

## 2020-10-07 DIAGNOSIS — I509 Heart failure, unspecified: Secondary | ICD-10-CM | POA: Diagnosis not present

## 2020-10-07 DIAGNOSIS — F0280 Dementia in other diseases classified elsewhere without behavioral disturbance: Secondary | ICD-10-CM | POA: Diagnosis not present

## 2020-10-07 DIAGNOSIS — G311 Senile degeneration of brain, not elsewhere classified: Secondary | ICD-10-CM | POA: Diagnosis not present

## 2020-10-07 DIAGNOSIS — M4800 Spinal stenosis, site unspecified: Secondary | ICD-10-CM | POA: Diagnosis not present

## 2020-10-07 DIAGNOSIS — D649 Anemia, unspecified: Secondary | ICD-10-CM | POA: Diagnosis not present

## 2020-10-07 DIAGNOSIS — R296 Repeated falls: Secondary | ICD-10-CM | POA: Diagnosis not present

## 2020-10-08 DIAGNOSIS — F0280 Dementia in other diseases classified elsewhere without behavioral disturbance: Secondary | ICD-10-CM | POA: Diagnosis not present

## 2020-10-08 DIAGNOSIS — I509 Heart failure, unspecified: Secondary | ICD-10-CM | POA: Diagnosis not present

## 2020-10-08 DIAGNOSIS — G311 Senile degeneration of brain, not elsewhere classified: Secondary | ICD-10-CM | POA: Diagnosis not present

## 2020-10-08 DIAGNOSIS — D649 Anemia, unspecified: Secondary | ICD-10-CM | POA: Diagnosis not present

## 2020-10-08 DIAGNOSIS — M4800 Spinal stenosis, site unspecified: Secondary | ICD-10-CM | POA: Diagnosis not present

## 2020-10-08 DIAGNOSIS — R296 Repeated falls: Secondary | ICD-10-CM | POA: Diagnosis not present

## 2020-10-09 DIAGNOSIS — F0280 Dementia in other diseases classified elsewhere without behavioral disturbance: Secondary | ICD-10-CM | POA: Diagnosis not present

## 2020-10-09 DIAGNOSIS — R296 Repeated falls: Secondary | ICD-10-CM | POA: Diagnosis not present

## 2020-10-09 DIAGNOSIS — D649 Anemia, unspecified: Secondary | ICD-10-CM | POA: Diagnosis not present

## 2020-10-09 DIAGNOSIS — G311 Senile degeneration of brain, not elsewhere classified: Secondary | ICD-10-CM | POA: Diagnosis not present

## 2020-10-09 DIAGNOSIS — M4800 Spinal stenosis, site unspecified: Secondary | ICD-10-CM | POA: Diagnosis not present

## 2020-10-09 DIAGNOSIS — I509 Heart failure, unspecified: Secondary | ICD-10-CM | POA: Diagnosis not present

## 2020-10-09 DIAGNOSIS — H401133 Primary open-angle glaucoma, bilateral, severe stage: Secondary | ICD-10-CM | POA: Diagnosis not present

## 2020-10-10 DIAGNOSIS — R296 Repeated falls: Secondary | ICD-10-CM | POA: Diagnosis not present

## 2020-10-10 DIAGNOSIS — M4800 Spinal stenosis, site unspecified: Secondary | ICD-10-CM | POA: Diagnosis not present

## 2020-10-10 DIAGNOSIS — I509 Heart failure, unspecified: Secondary | ICD-10-CM | POA: Diagnosis not present

## 2020-10-10 DIAGNOSIS — D649 Anemia, unspecified: Secondary | ICD-10-CM | POA: Diagnosis not present

## 2020-10-10 DIAGNOSIS — G311 Senile degeneration of brain, not elsewhere classified: Secondary | ICD-10-CM | POA: Diagnosis not present

## 2020-10-10 DIAGNOSIS — F0280 Dementia in other diseases classified elsewhere without behavioral disturbance: Secondary | ICD-10-CM | POA: Diagnosis not present

## 2020-10-11 DIAGNOSIS — R296 Repeated falls: Secondary | ICD-10-CM | POA: Diagnosis not present

## 2020-10-11 DIAGNOSIS — G311 Senile degeneration of brain, not elsewhere classified: Secondary | ICD-10-CM | POA: Diagnosis not present

## 2020-10-11 DIAGNOSIS — I509 Heart failure, unspecified: Secondary | ICD-10-CM | POA: Diagnosis not present

## 2020-10-11 DIAGNOSIS — D649 Anemia, unspecified: Secondary | ICD-10-CM | POA: Diagnosis not present

## 2020-10-11 DIAGNOSIS — F0280 Dementia in other diseases classified elsewhere without behavioral disturbance: Secondary | ICD-10-CM | POA: Diagnosis not present

## 2020-10-11 DIAGNOSIS — M4800 Spinal stenosis, site unspecified: Secondary | ICD-10-CM | POA: Diagnosis not present

## 2020-10-14 DIAGNOSIS — M4800 Spinal stenosis, site unspecified: Secondary | ICD-10-CM | POA: Diagnosis not present

## 2020-10-14 DIAGNOSIS — R296 Repeated falls: Secondary | ICD-10-CM | POA: Diagnosis not present

## 2020-10-14 DIAGNOSIS — I509 Heart failure, unspecified: Secondary | ICD-10-CM | POA: Diagnosis not present

## 2020-10-14 DIAGNOSIS — G311 Senile degeneration of brain, not elsewhere classified: Secondary | ICD-10-CM | POA: Diagnosis not present

## 2020-10-14 DIAGNOSIS — D649 Anemia, unspecified: Secondary | ICD-10-CM | POA: Diagnosis not present

## 2020-10-14 DIAGNOSIS — F0280 Dementia in other diseases classified elsewhere without behavioral disturbance: Secondary | ICD-10-CM | POA: Diagnosis not present

## 2020-10-15 DIAGNOSIS — F0280 Dementia in other diseases classified elsewhere without behavioral disturbance: Secondary | ICD-10-CM | POA: Diagnosis not present

## 2020-10-15 DIAGNOSIS — R296 Repeated falls: Secondary | ICD-10-CM | POA: Diagnosis not present

## 2020-10-15 DIAGNOSIS — G311 Senile degeneration of brain, not elsewhere classified: Secondary | ICD-10-CM | POA: Diagnosis not present

## 2020-10-15 DIAGNOSIS — I509 Heart failure, unspecified: Secondary | ICD-10-CM | POA: Diagnosis not present

## 2020-10-15 DIAGNOSIS — D649 Anemia, unspecified: Secondary | ICD-10-CM | POA: Diagnosis not present

## 2020-10-15 DIAGNOSIS — M4800 Spinal stenosis, site unspecified: Secondary | ICD-10-CM | POA: Diagnosis not present

## 2020-10-16 DIAGNOSIS — M4800 Spinal stenosis, site unspecified: Secondary | ICD-10-CM | POA: Diagnosis not present

## 2020-10-16 DIAGNOSIS — R296 Repeated falls: Secondary | ICD-10-CM | POA: Diagnosis not present

## 2020-10-16 DIAGNOSIS — G311 Senile degeneration of brain, not elsewhere classified: Secondary | ICD-10-CM | POA: Diagnosis not present

## 2020-10-16 DIAGNOSIS — D649 Anemia, unspecified: Secondary | ICD-10-CM | POA: Diagnosis not present

## 2020-10-16 DIAGNOSIS — I509 Heart failure, unspecified: Secondary | ICD-10-CM | POA: Diagnosis not present

## 2020-10-16 DIAGNOSIS — F0280 Dementia in other diseases classified elsewhere without behavioral disturbance: Secondary | ICD-10-CM | POA: Diagnosis not present

## 2020-10-17 DIAGNOSIS — M4800 Spinal stenosis, site unspecified: Secondary | ICD-10-CM | POA: Diagnosis not present

## 2020-10-17 DIAGNOSIS — D649 Anemia, unspecified: Secondary | ICD-10-CM | POA: Diagnosis not present

## 2020-10-17 DIAGNOSIS — F0280 Dementia in other diseases classified elsewhere without behavioral disturbance: Secondary | ICD-10-CM | POA: Diagnosis not present

## 2020-10-17 DIAGNOSIS — I509 Heart failure, unspecified: Secondary | ICD-10-CM | POA: Diagnosis not present

## 2020-10-17 DIAGNOSIS — G311 Senile degeneration of brain, not elsewhere classified: Secondary | ICD-10-CM | POA: Diagnosis not present

## 2020-10-17 DIAGNOSIS — R296 Repeated falls: Secondary | ICD-10-CM | POA: Diagnosis not present

## 2020-10-18 DIAGNOSIS — D649 Anemia, unspecified: Secondary | ICD-10-CM | POA: Diagnosis not present

## 2020-10-18 DIAGNOSIS — I509 Heart failure, unspecified: Secondary | ICD-10-CM | POA: Diagnosis not present

## 2020-10-18 DIAGNOSIS — M4800 Spinal stenosis, site unspecified: Secondary | ICD-10-CM | POA: Diagnosis not present

## 2020-10-18 DIAGNOSIS — G311 Senile degeneration of brain, not elsewhere classified: Secondary | ICD-10-CM | POA: Diagnosis not present

## 2020-10-18 DIAGNOSIS — R296 Repeated falls: Secondary | ICD-10-CM | POA: Diagnosis not present

## 2020-10-18 DIAGNOSIS — F0280 Dementia in other diseases classified elsewhere without behavioral disturbance: Secondary | ICD-10-CM | POA: Diagnosis not present

## 2020-10-21 DIAGNOSIS — M4800 Spinal stenosis, site unspecified: Secondary | ICD-10-CM | POA: Diagnosis not present

## 2020-10-21 DIAGNOSIS — D649 Anemia, unspecified: Secondary | ICD-10-CM | POA: Diagnosis not present

## 2020-10-21 DIAGNOSIS — G311 Senile degeneration of brain, not elsewhere classified: Secondary | ICD-10-CM | POA: Diagnosis not present

## 2020-10-21 DIAGNOSIS — I509 Heart failure, unspecified: Secondary | ICD-10-CM | POA: Diagnosis not present

## 2020-10-21 DIAGNOSIS — R296 Repeated falls: Secondary | ICD-10-CM | POA: Diagnosis not present

## 2020-10-21 DIAGNOSIS — F0280 Dementia in other diseases classified elsewhere without behavioral disturbance: Secondary | ICD-10-CM | POA: Diagnosis not present

## 2020-10-22 DIAGNOSIS — M4800 Spinal stenosis, site unspecified: Secondary | ICD-10-CM | POA: Diagnosis not present

## 2020-10-22 DIAGNOSIS — G311 Senile degeneration of brain, not elsewhere classified: Secondary | ICD-10-CM | POA: Diagnosis not present

## 2020-10-22 DIAGNOSIS — R296 Repeated falls: Secondary | ICD-10-CM | POA: Diagnosis not present

## 2020-10-22 DIAGNOSIS — F0280 Dementia in other diseases classified elsewhere without behavioral disturbance: Secondary | ICD-10-CM | POA: Diagnosis not present

## 2020-10-22 DIAGNOSIS — D649 Anemia, unspecified: Secondary | ICD-10-CM | POA: Diagnosis not present

## 2020-10-22 DIAGNOSIS — I509 Heart failure, unspecified: Secondary | ICD-10-CM | POA: Diagnosis not present

## 2020-10-23 DIAGNOSIS — D649 Anemia, unspecified: Secondary | ICD-10-CM | POA: Diagnosis not present

## 2020-10-23 DIAGNOSIS — M4800 Spinal stenosis, site unspecified: Secondary | ICD-10-CM | POA: Diagnosis not present

## 2020-10-23 DIAGNOSIS — F0280 Dementia in other diseases classified elsewhere without behavioral disturbance: Secondary | ICD-10-CM | POA: Diagnosis not present

## 2020-10-23 DIAGNOSIS — G311 Senile degeneration of brain, not elsewhere classified: Secondary | ICD-10-CM | POA: Diagnosis not present

## 2020-10-23 DIAGNOSIS — I509 Heart failure, unspecified: Secondary | ICD-10-CM | POA: Diagnosis not present

## 2020-10-23 DIAGNOSIS — R296 Repeated falls: Secondary | ICD-10-CM | POA: Diagnosis not present

## 2020-10-24 DIAGNOSIS — M4800 Spinal stenosis, site unspecified: Secondary | ICD-10-CM | POA: Diagnosis not present

## 2020-10-24 DIAGNOSIS — I509 Heart failure, unspecified: Secondary | ICD-10-CM | POA: Diagnosis not present

## 2020-10-24 DIAGNOSIS — F0280 Dementia in other diseases classified elsewhere without behavioral disturbance: Secondary | ICD-10-CM | POA: Diagnosis not present

## 2020-10-24 DIAGNOSIS — G311 Senile degeneration of brain, not elsewhere classified: Secondary | ICD-10-CM | POA: Diagnosis not present

## 2020-10-24 DIAGNOSIS — R296 Repeated falls: Secondary | ICD-10-CM | POA: Diagnosis not present

## 2020-10-24 DIAGNOSIS — D649 Anemia, unspecified: Secondary | ICD-10-CM | POA: Diagnosis not present

## 2020-10-25 DIAGNOSIS — D649 Anemia, unspecified: Secondary | ICD-10-CM | POA: Diagnosis not present

## 2020-10-25 DIAGNOSIS — R296 Repeated falls: Secondary | ICD-10-CM | POA: Diagnosis not present

## 2020-10-25 DIAGNOSIS — G311 Senile degeneration of brain, not elsewhere classified: Secondary | ICD-10-CM | POA: Diagnosis not present

## 2020-10-25 DIAGNOSIS — M4800 Spinal stenosis, site unspecified: Secondary | ICD-10-CM | POA: Diagnosis not present

## 2020-10-25 DIAGNOSIS — F0280 Dementia in other diseases classified elsewhere without behavioral disturbance: Secondary | ICD-10-CM | POA: Diagnosis not present

## 2020-10-25 DIAGNOSIS — I509 Heart failure, unspecified: Secondary | ICD-10-CM | POA: Diagnosis not present

## 2020-10-28 DIAGNOSIS — M4800 Spinal stenosis, site unspecified: Secondary | ICD-10-CM | POA: Diagnosis not present

## 2020-10-28 DIAGNOSIS — G311 Senile degeneration of brain, not elsewhere classified: Secondary | ICD-10-CM | POA: Diagnosis not present

## 2020-10-28 DIAGNOSIS — D649 Anemia, unspecified: Secondary | ICD-10-CM | POA: Diagnosis not present

## 2020-10-28 DIAGNOSIS — R296 Repeated falls: Secondary | ICD-10-CM | POA: Diagnosis not present

## 2020-10-28 DIAGNOSIS — I509 Heart failure, unspecified: Secondary | ICD-10-CM | POA: Diagnosis not present

## 2020-10-28 DIAGNOSIS — F0280 Dementia in other diseases classified elsewhere without behavioral disturbance: Secondary | ICD-10-CM | POA: Diagnosis not present

## 2020-10-29 DIAGNOSIS — I10 Essential (primary) hypertension: Secondary | ICD-10-CM | POA: Diagnosis not present

## 2020-10-29 DIAGNOSIS — G311 Senile degeneration of brain, not elsewhere classified: Secondary | ICD-10-CM | POA: Diagnosis not present

## 2020-10-29 DIAGNOSIS — D649 Anemia, unspecified: Secondary | ICD-10-CM | POA: Diagnosis not present

## 2020-10-29 DIAGNOSIS — Z515 Encounter for palliative care: Secondary | ICD-10-CM | POA: Diagnosis not present

## 2020-10-29 DIAGNOSIS — I509 Heart failure, unspecified: Secondary | ICD-10-CM | POA: Diagnosis not present

## 2020-10-29 DIAGNOSIS — R296 Repeated falls: Secondary | ICD-10-CM | POA: Diagnosis not present

## 2020-10-29 DIAGNOSIS — M4800 Spinal stenosis, site unspecified: Secondary | ICD-10-CM | POA: Diagnosis not present

## 2020-10-29 DIAGNOSIS — F0280 Dementia in other diseases classified elsewhere without behavioral disturbance: Secondary | ICD-10-CM | POA: Diagnosis not present

## 2020-10-30 DIAGNOSIS — D649 Anemia, unspecified: Secondary | ICD-10-CM | POA: Diagnosis not present

## 2020-10-30 DIAGNOSIS — M4800 Spinal stenosis, site unspecified: Secondary | ICD-10-CM | POA: Diagnosis not present

## 2020-10-30 DIAGNOSIS — F0280 Dementia in other diseases classified elsewhere without behavioral disturbance: Secondary | ICD-10-CM | POA: Diagnosis not present

## 2020-10-30 DIAGNOSIS — I509 Heart failure, unspecified: Secondary | ICD-10-CM | POA: Diagnosis not present

## 2020-10-30 DIAGNOSIS — R296 Repeated falls: Secondary | ICD-10-CM | POA: Diagnosis not present

## 2020-10-30 DIAGNOSIS — G311 Senile degeneration of brain, not elsewhere classified: Secondary | ICD-10-CM | POA: Diagnosis not present

## 2020-10-31 DIAGNOSIS — I509 Heart failure, unspecified: Secondary | ICD-10-CM | POA: Diagnosis not present

## 2020-10-31 DIAGNOSIS — M4800 Spinal stenosis, site unspecified: Secondary | ICD-10-CM | POA: Diagnosis not present

## 2020-10-31 DIAGNOSIS — G311 Senile degeneration of brain, not elsewhere classified: Secondary | ICD-10-CM | POA: Diagnosis not present

## 2020-10-31 DIAGNOSIS — D649 Anemia, unspecified: Secondary | ICD-10-CM | POA: Diagnosis not present

## 2020-10-31 DIAGNOSIS — F0280 Dementia in other diseases classified elsewhere without behavioral disturbance: Secondary | ICD-10-CM | POA: Diagnosis not present

## 2020-10-31 DIAGNOSIS — R296 Repeated falls: Secondary | ICD-10-CM | POA: Diagnosis not present

## 2020-11-01 DIAGNOSIS — R296 Repeated falls: Secondary | ICD-10-CM | POA: Diagnosis not present

## 2020-11-01 DIAGNOSIS — I509 Heart failure, unspecified: Secondary | ICD-10-CM | POA: Diagnosis not present

## 2020-11-01 DIAGNOSIS — G311 Senile degeneration of brain, not elsewhere classified: Secondary | ICD-10-CM | POA: Diagnosis not present

## 2020-11-01 DIAGNOSIS — M4800 Spinal stenosis, site unspecified: Secondary | ICD-10-CM | POA: Diagnosis not present

## 2020-11-01 DIAGNOSIS — D649 Anemia, unspecified: Secondary | ICD-10-CM | POA: Diagnosis not present

## 2020-11-01 DIAGNOSIS — F0280 Dementia in other diseases classified elsewhere without behavioral disturbance: Secondary | ICD-10-CM | POA: Diagnosis not present

## 2020-11-04 DIAGNOSIS — D649 Anemia, unspecified: Secondary | ICD-10-CM | POA: Diagnosis not present

## 2020-11-04 DIAGNOSIS — F0280 Dementia in other diseases classified elsewhere without behavioral disturbance: Secondary | ICD-10-CM | POA: Diagnosis not present

## 2020-11-04 DIAGNOSIS — I509 Heart failure, unspecified: Secondary | ICD-10-CM | POA: Diagnosis not present

## 2020-11-04 DIAGNOSIS — M4800 Spinal stenosis, site unspecified: Secondary | ICD-10-CM | POA: Diagnosis not present

## 2020-11-04 DIAGNOSIS — R296 Repeated falls: Secondary | ICD-10-CM | POA: Diagnosis not present

## 2020-11-04 DIAGNOSIS — G311 Senile degeneration of brain, not elsewhere classified: Secondary | ICD-10-CM | POA: Diagnosis not present

## 2020-11-05 DIAGNOSIS — F0280 Dementia in other diseases classified elsewhere without behavioral disturbance: Secondary | ICD-10-CM | POA: Diagnosis not present

## 2020-11-05 DIAGNOSIS — D649 Anemia, unspecified: Secondary | ICD-10-CM | POA: Diagnosis not present

## 2020-11-05 DIAGNOSIS — I509 Heart failure, unspecified: Secondary | ICD-10-CM | POA: Diagnosis not present

## 2020-11-05 DIAGNOSIS — R296 Repeated falls: Secondary | ICD-10-CM | POA: Diagnosis not present

## 2020-11-05 DIAGNOSIS — M4800 Spinal stenosis, site unspecified: Secondary | ICD-10-CM | POA: Diagnosis not present

## 2020-11-05 DIAGNOSIS — G311 Senile degeneration of brain, not elsewhere classified: Secondary | ICD-10-CM | POA: Diagnosis not present

## 2020-11-06 DIAGNOSIS — F0280 Dementia in other diseases classified elsewhere without behavioral disturbance: Secondary | ICD-10-CM | POA: Diagnosis not present

## 2020-11-06 DIAGNOSIS — G311 Senile degeneration of brain, not elsewhere classified: Secondary | ICD-10-CM | POA: Diagnosis not present

## 2020-11-06 DIAGNOSIS — R296 Repeated falls: Secondary | ICD-10-CM | POA: Diagnosis not present

## 2020-11-06 DIAGNOSIS — I509 Heart failure, unspecified: Secondary | ICD-10-CM | POA: Diagnosis not present

## 2020-11-06 DIAGNOSIS — D649 Anemia, unspecified: Secondary | ICD-10-CM | POA: Diagnosis not present

## 2020-11-06 DIAGNOSIS — M4800 Spinal stenosis, site unspecified: Secondary | ICD-10-CM | POA: Diagnosis not present

## 2020-11-07 DIAGNOSIS — G311 Senile degeneration of brain, not elsewhere classified: Secondary | ICD-10-CM | POA: Diagnosis not present

## 2020-11-07 DIAGNOSIS — D649 Anemia, unspecified: Secondary | ICD-10-CM | POA: Diagnosis not present

## 2020-11-07 DIAGNOSIS — F0280 Dementia in other diseases classified elsewhere without behavioral disturbance: Secondary | ICD-10-CM | POA: Diagnosis not present

## 2020-11-07 DIAGNOSIS — M4800 Spinal stenosis, site unspecified: Secondary | ICD-10-CM | POA: Diagnosis not present

## 2020-11-07 DIAGNOSIS — I509 Heart failure, unspecified: Secondary | ICD-10-CM | POA: Diagnosis not present

## 2020-11-07 DIAGNOSIS — R296 Repeated falls: Secondary | ICD-10-CM | POA: Diagnosis not present

## 2020-11-08 DIAGNOSIS — G311 Senile degeneration of brain, not elsewhere classified: Secondary | ICD-10-CM | POA: Diagnosis not present

## 2020-11-08 DIAGNOSIS — R296 Repeated falls: Secondary | ICD-10-CM | POA: Diagnosis not present

## 2020-11-08 DIAGNOSIS — D649 Anemia, unspecified: Secondary | ICD-10-CM | POA: Diagnosis not present

## 2020-11-08 DIAGNOSIS — I509 Heart failure, unspecified: Secondary | ICD-10-CM | POA: Diagnosis not present

## 2020-11-08 DIAGNOSIS — M4800 Spinal stenosis, site unspecified: Secondary | ICD-10-CM | POA: Diagnosis not present

## 2020-11-08 DIAGNOSIS — F0280 Dementia in other diseases classified elsewhere without behavioral disturbance: Secondary | ICD-10-CM | POA: Diagnosis not present

## 2020-11-11 DIAGNOSIS — M4800 Spinal stenosis, site unspecified: Secondary | ICD-10-CM | POA: Diagnosis not present

## 2020-11-11 DIAGNOSIS — F0280 Dementia in other diseases classified elsewhere without behavioral disturbance: Secondary | ICD-10-CM | POA: Diagnosis not present

## 2020-11-11 DIAGNOSIS — G311 Senile degeneration of brain, not elsewhere classified: Secondary | ICD-10-CM | POA: Diagnosis not present

## 2020-11-11 DIAGNOSIS — I509 Heart failure, unspecified: Secondary | ICD-10-CM | POA: Diagnosis not present

## 2020-11-11 DIAGNOSIS — D649 Anemia, unspecified: Secondary | ICD-10-CM | POA: Diagnosis not present

## 2020-11-11 DIAGNOSIS — R296 Repeated falls: Secondary | ICD-10-CM | POA: Diagnosis not present

## 2020-11-12 DIAGNOSIS — I509 Heart failure, unspecified: Secondary | ICD-10-CM | POA: Diagnosis not present

## 2020-11-12 DIAGNOSIS — G311 Senile degeneration of brain, not elsewhere classified: Secondary | ICD-10-CM | POA: Diagnosis not present

## 2020-11-12 DIAGNOSIS — D649 Anemia, unspecified: Secondary | ICD-10-CM | POA: Diagnosis not present

## 2020-11-12 DIAGNOSIS — F0280 Dementia in other diseases classified elsewhere without behavioral disturbance: Secondary | ICD-10-CM | POA: Diagnosis not present

## 2020-11-12 DIAGNOSIS — M4800 Spinal stenosis, site unspecified: Secondary | ICD-10-CM | POA: Diagnosis not present

## 2020-11-12 DIAGNOSIS — R296 Repeated falls: Secondary | ICD-10-CM | POA: Diagnosis not present

## 2020-11-13 DIAGNOSIS — R296 Repeated falls: Secondary | ICD-10-CM | POA: Diagnosis not present

## 2020-11-13 DIAGNOSIS — F0280 Dementia in other diseases classified elsewhere without behavioral disturbance: Secondary | ICD-10-CM | POA: Diagnosis not present

## 2020-11-13 DIAGNOSIS — D649 Anemia, unspecified: Secondary | ICD-10-CM | POA: Diagnosis not present

## 2020-11-13 DIAGNOSIS — M4800 Spinal stenosis, site unspecified: Secondary | ICD-10-CM | POA: Diagnosis not present

## 2020-11-13 DIAGNOSIS — I509 Heart failure, unspecified: Secondary | ICD-10-CM | POA: Diagnosis not present

## 2020-11-13 DIAGNOSIS — G311 Senile degeneration of brain, not elsewhere classified: Secondary | ICD-10-CM | POA: Diagnosis not present

## 2020-11-14 DIAGNOSIS — D649 Anemia, unspecified: Secondary | ICD-10-CM | POA: Diagnosis not present

## 2020-11-14 DIAGNOSIS — G311 Senile degeneration of brain, not elsewhere classified: Secondary | ICD-10-CM | POA: Diagnosis not present

## 2020-11-14 DIAGNOSIS — F0280 Dementia in other diseases classified elsewhere without behavioral disturbance: Secondary | ICD-10-CM | POA: Diagnosis not present

## 2020-11-14 DIAGNOSIS — M4800 Spinal stenosis, site unspecified: Secondary | ICD-10-CM | POA: Diagnosis not present

## 2020-11-14 DIAGNOSIS — R296 Repeated falls: Secondary | ICD-10-CM | POA: Diagnosis not present

## 2020-11-14 DIAGNOSIS — I509 Heart failure, unspecified: Secondary | ICD-10-CM | POA: Diagnosis not present

## 2020-11-15 DIAGNOSIS — G311 Senile degeneration of brain, not elsewhere classified: Secondary | ICD-10-CM | POA: Diagnosis not present

## 2020-11-15 DIAGNOSIS — M4800 Spinal stenosis, site unspecified: Secondary | ICD-10-CM | POA: Diagnosis not present

## 2020-11-15 DIAGNOSIS — D649 Anemia, unspecified: Secondary | ICD-10-CM | POA: Diagnosis not present

## 2020-11-15 DIAGNOSIS — I509 Heart failure, unspecified: Secondary | ICD-10-CM | POA: Diagnosis not present

## 2020-11-15 DIAGNOSIS — R296 Repeated falls: Secondary | ICD-10-CM | POA: Diagnosis not present

## 2020-11-15 DIAGNOSIS — F0280 Dementia in other diseases classified elsewhere without behavioral disturbance: Secondary | ICD-10-CM | POA: Diagnosis not present

## 2020-11-18 DIAGNOSIS — D649 Anemia, unspecified: Secondary | ICD-10-CM | POA: Diagnosis not present

## 2020-11-18 DIAGNOSIS — F0280 Dementia in other diseases classified elsewhere without behavioral disturbance: Secondary | ICD-10-CM | POA: Diagnosis not present

## 2020-11-18 DIAGNOSIS — G311 Senile degeneration of brain, not elsewhere classified: Secondary | ICD-10-CM | POA: Diagnosis not present

## 2020-11-18 DIAGNOSIS — I509 Heart failure, unspecified: Secondary | ICD-10-CM | POA: Diagnosis not present

## 2020-11-18 DIAGNOSIS — M4800 Spinal stenosis, site unspecified: Secondary | ICD-10-CM | POA: Diagnosis not present

## 2020-11-18 DIAGNOSIS — R296 Repeated falls: Secondary | ICD-10-CM | POA: Diagnosis not present

## 2020-11-19 DIAGNOSIS — F0280 Dementia in other diseases classified elsewhere without behavioral disturbance: Secondary | ICD-10-CM | POA: Diagnosis not present

## 2020-11-19 DIAGNOSIS — I509 Heart failure, unspecified: Secondary | ICD-10-CM | POA: Diagnosis not present

## 2020-11-19 DIAGNOSIS — M4800 Spinal stenosis, site unspecified: Secondary | ICD-10-CM | POA: Diagnosis not present

## 2020-11-19 DIAGNOSIS — D649 Anemia, unspecified: Secondary | ICD-10-CM | POA: Diagnosis not present

## 2020-11-19 DIAGNOSIS — G311 Senile degeneration of brain, not elsewhere classified: Secondary | ICD-10-CM | POA: Diagnosis not present

## 2020-11-19 DIAGNOSIS — R296 Repeated falls: Secondary | ICD-10-CM | POA: Diagnosis not present

## 2020-11-20 DIAGNOSIS — R296 Repeated falls: Secondary | ICD-10-CM | POA: Diagnosis not present

## 2020-11-20 DIAGNOSIS — F0280 Dementia in other diseases classified elsewhere without behavioral disturbance: Secondary | ICD-10-CM | POA: Diagnosis not present

## 2020-11-20 DIAGNOSIS — M4800 Spinal stenosis, site unspecified: Secondary | ICD-10-CM | POA: Diagnosis not present

## 2020-11-20 DIAGNOSIS — D649 Anemia, unspecified: Secondary | ICD-10-CM | POA: Diagnosis not present

## 2020-11-20 DIAGNOSIS — I509 Heart failure, unspecified: Secondary | ICD-10-CM | POA: Diagnosis not present

## 2020-11-20 DIAGNOSIS — G311 Senile degeneration of brain, not elsewhere classified: Secondary | ICD-10-CM | POA: Diagnosis not present

## 2020-11-21 DIAGNOSIS — I509 Heart failure, unspecified: Secondary | ICD-10-CM | POA: Diagnosis not present

## 2020-11-21 DIAGNOSIS — D649 Anemia, unspecified: Secondary | ICD-10-CM | POA: Diagnosis not present

## 2020-11-21 DIAGNOSIS — M4800 Spinal stenosis, site unspecified: Secondary | ICD-10-CM | POA: Diagnosis not present

## 2020-11-21 DIAGNOSIS — R296 Repeated falls: Secondary | ICD-10-CM | POA: Diagnosis not present

## 2020-11-21 DIAGNOSIS — F0280 Dementia in other diseases classified elsewhere without behavioral disturbance: Secondary | ICD-10-CM | POA: Diagnosis not present

## 2020-11-21 DIAGNOSIS — G311 Senile degeneration of brain, not elsewhere classified: Secondary | ICD-10-CM | POA: Diagnosis not present

## 2020-11-22 DIAGNOSIS — R296 Repeated falls: Secondary | ICD-10-CM | POA: Diagnosis not present

## 2020-11-22 DIAGNOSIS — I509 Heart failure, unspecified: Secondary | ICD-10-CM | POA: Diagnosis not present

## 2020-11-22 DIAGNOSIS — D649 Anemia, unspecified: Secondary | ICD-10-CM | POA: Diagnosis not present

## 2020-11-22 DIAGNOSIS — F0280 Dementia in other diseases classified elsewhere without behavioral disturbance: Secondary | ICD-10-CM | POA: Diagnosis not present

## 2020-11-22 DIAGNOSIS — M4800 Spinal stenosis, site unspecified: Secondary | ICD-10-CM | POA: Diagnosis not present

## 2020-11-22 DIAGNOSIS — G311 Senile degeneration of brain, not elsewhere classified: Secondary | ICD-10-CM | POA: Diagnosis not present

## 2020-11-25 DIAGNOSIS — M4800 Spinal stenosis, site unspecified: Secondary | ICD-10-CM | POA: Diagnosis not present

## 2020-11-25 DIAGNOSIS — F0280 Dementia in other diseases classified elsewhere without behavioral disturbance: Secondary | ICD-10-CM | POA: Diagnosis not present

## 2020-11-25 DIAGNOSIS — R296 Repeated falls: Secondary | ICD-10-CM | POA: Diagnosis not present

## 2020-11-25 DIAGNOSIS — G311 Senile degeneration of brain, not elsewhere classified: Secondary | ICD-10-CM | POA: Diagnosis not present

## 2020-11-25 DIAGNOSIS — I509 Heart failure, unspecified: Secondary | ICD-10-CM | POA: Diagnosis not present

## 2020-11-25 DIAGNOSIS — D649 Anemia, unspecified: Secondary | ICD-10-CM | POA: Diagnosis not present

## 2020-11-26 DIAGNOSIS — I509 Heart failure, unspecified: Secondary | ICD-10-CM | POA: Diagnosis not present

## 2020-11-26 DIAGNOSIS — G311 Senile degeneration of brain, not elsewhere classified: Secondary | ICD-10-CM | POA: Diagnosis not present

## 2020-11-26 DIAGNOSIS — F0280 Dementia in other diseases classified elsewhere without behavioral disturbance: Secondary | ICD-10-CM | POA: Diagnosis not present

## 2020-11-26 DIAGNOSIS — M4800 Spinal stenosis, site unspecified: Secondary | ICD-10-CM | POA: Diagnosis not present

## 2020-11-26 DIAGNOSIS — D649 Anemia, unspecified: Secondary | ICD-10-CM | POA: Diagnosis not present

## 2020-11-26 DIAGNOSIS — R296 Repeated falls: Secondary | ICD-10-CM | POA: Diagnosis not present

## 2020-11-27 DIAGNOSIS — I509 Heart failure, unspecified: Secondary | ICD-10-CM | POA: Diagnosis not present

## 2020-11-27 DIAGNOSIS — G311 Senile degeneration of brain, not elsewhere classified: Secondary | ICD-10-CM | POA: Diagnosis not present

## 2020-11-27 DIAGNOSIS — D649 Anemia, unspecified: Secondary | ICD-10-CM | POA: Diagnosis not present

## 2020-11-27 DIAGNOSIS — M4800 Spinal stenosis, site unspecified: Secondary | ICD-10-CM | POA: Diagnosis not present

## 2020-11-27 DIAGNOSIS — F0280 Dementia in other diseases classified elsewhere without behavioral disturbance: Secondary | ICD-10-CM | POA: Diagnosis not present

## 2020-11-27 DIAGNOSIS — R296 Repeated falls: Secondary | ICD-10-CM | POA: Diagnosis not present

## 2020-11-28 DIAGNOSIS — R296 Repeated falls: Secondary | ICD-10-CM | POA: Diagnosis not present

## 2020-11-28 DIAGNOSIS — I509 Heart failure, unspecified: Secondary | ICD-10-CM | POA: Diagnosis not present

## 2020-11-28 DIAGNOSIS — M4800 Spinal stenosis, site unspecified: Secondary | ICD-10-CM | POA: Diagnosis not present

## 2020-11-28 DIAGNOSIS — F0280 Dementia in other diseases classified elsewhere without behavioral disturbance: Secondary | ICD-10-CM | POA: Diagnosis not present

## 2020-11-28 DIAGNOSIS — D649 Anemia, unspecified: Secondary | ICD-10-CM | POA: Diagnosis not present

## 2020-11-28 DIAGNOSIS — G311 Senile degeneration of brain, not elsewhere classified: Secondary | ICD-10-CM | POA: Diagnosis not present

## 2020-11-29 DIAGNOSIS — I10 Essential (primary) hypertension: Secondary | ICD-10-CM | POA: Diagnosis not present

## 2020-11-29 DIAGNOSIS — G311 Senile degeneration of brain, not elsewhere classified: Secondary | ICD-10-CM | POA: Diagnosis not present

## 2020-11-29 DIAGNOSIS — R296 Repeated falls: Secondary | ICD-10-CM | POA: Diagnosis not present

## 2020-11-29 DIAGNOSIS — F0280 Dementia in other diseases classified elsewhere without behavioral disturbance: Secondary | ICD-10-CM | POA: Diagnosis not present

## 2020-11-29 DIAGNOSIS — I509 Heart failure, unspecified: Secondary | ICD-10-CM | POA: Diagnosis not present

## 2020-11-29 DIAGNOSIS — D649 Anemia, unspecified: Secondary | ICD-10-CM | POA: Diagnosis not present

## 2020-11-29 DIAGNOSIS — M4800 Spinal stenosis, site unspecified: Secondary | ICD-10-CM | POA: Diagnosis not present

## 2020-11-29 DIAGNOSIS — Z515 Encounter for palliative care: Secondary | ICD-10-CM | POA: Diagnosis not present

## 2020-12-02 DIAGNOSIS — G311 Senile degeneration of brain, not elsewhere classified: Secondary | ICD-10-CM | POA: Diagnosis not present

## 2020-12-02 DIAGNOSIS — D649 Anemia, unspecified: Secondary | ICD-10-CM | POA: Diagnosis not present

## 2020-12-02 DIAGNOSIS — I509 Heart failure, unspecified: Secondary | ICD-10-CM | POA: Diagnosis not present

## 2020-12-02 DIAGNOSIS — R296 Repeated falls: Secondary | ICD-10-CM | POA: Diagnosis not present

## 2020-12-02 DIAGNOSIS — M4800 Spinal stenosis, site unspecified: Secondary | ICD-10-CM | POA: Diagnosis not present

## 2020-12-02 DIAGNOSIS — F0280 Dementia in other diseases classified elsewhere without behavioral disturbance: Secondary | ICD-10-CM | POA: Diagnosis not present

## 2020-12-03 DIAGNOSIS — G311 Senile degeneration of brain, not elsewhere classified: Secondary | ICD-10-CM | POA: Diagnosis not present

## 2020-12-03 DIAGNOSIS — I509 Heart failure, unspecified: Secondary | ICD-10-CM | POA: Diagnosis not present

## 2020-12-03 DIAGNOSIS — F0280 Dementia in other diseases classified elsewhere without behavioral disturbance: Secondary | ICD-10-CM | POA: Diagnosis not present

## 2020-12-03 DIAGNOSIS — M4800 Spinal stenosis, site unspecified: Secondary | ICD-10-CM | POA: Diagnosis not present

## 2020-12-03 DIAGNOSIS — R296 Repeated falls: Secondary | ICD-10-CM | POA: Diagnosis not present

## 2020-12-03 DIAGNOSIS — D649 Anemia, unspecified: Secondary | ICD-10-CM | POA: Diagnosis not present

## 2020-12-04 DIAGNOSIS — I509 Heart failure, unspecified: Secondary | ICD-10-CM | POA: Diagnosis not present

## 2020-12-04 DIAGNOSIS — D649 Anemia, unspecified: Secondary | ICD-10-CM | POA: Diagnosis not present

## 2020-12-04 DIAGNOSIS — M4800 Spinal stenosis, site unspecified: Secondary | ICD-10-CM | POA: Diagnosis not present

## 2020-12-04 DIAGNOSIS — G311 Senile degeneration of brain, not elsewhere classified: Secondary | ICD-10-CM | POA: Diagnosis not present

## 2020-12-04 DIAGNOSIS — R296 Repeated falls: Secondary | ICD-10-CM | POA: Diagnosis not present

## 2020-12-04 DIAGNOSIS — F0280 Dementia in other diseases classified elsewhere without behavioral disturbance: Secondary | ICD-10-CM | POA: Diagnosis not present

## 2020-12-05 DIAGNOSIS — F0280 Dementia in other diseases classified elsewhere without behavioral disturbance: Secondary | ICD-10-CM | POA: Diagnosis not present

## 2020-12-05 DIAGNOSIS — M4800 Spinal stenosis, site unspecified: Secondary | ICD-10-CM | POA: Diagnosis not present

## 2020-12-05 DIAGNOSIS — G311 Senile degeneration of brain, not elsewhere classified: Secondary | ICD-10-CM | POA: Diagnosis not present

## 2020-12-05 DIAGNOSIS — R296 Repeated falls: Secondary | ICD-10-CM | POA: Diagnosis not present

## 2020-12-05 DIAGNOSIS — D649 Anemia, unspecified: Secondary | ICD-10-CM | POA: Diagnosis not present

## 2020-12-05 DIAGNOSIS — I509 Heart failure, unspecified: Secondary | ICD-10-CM | POA: Diagnosis not present

## 2020-12-06 DIAGNOSIS — R296 Repeated falls: Secondary | ICD-10-CM | POA: Diagnosis not present

## 2020-12-06 DIAGNOSIS — G311 Senile degeneration of brain, not elsewhere classified: Secondary | ICD-10-CM | POA: Diagnosis not present

## 2020-12-06 DIAGNOSIS — F0280 Dementia in other diseases classified elsewhere without behavioral disturbance: Secondary | ICD-10-CM | POA: Diagnosis not present

## 2020-12-06 DIAGNOSIS — D649 Anemia, unspecified: Secondary | ICD-10-CM | POA: Diagnosis not present

## 2020-12-06 DIAGNOSIS — M4800 Spinal stenosis, site unspecified: Secondary | ICD-10-CM | POA: Diagnosis not present

## 2020-12-06 DIAGNOSIS — I509 Heart failure, unspecified: Secondary | ICD-10-CM | POA: Diagnosis not present

## 2020-12-08 DIAGNOSIS — F0280 Dementia in other diseases classified elsewhere without behavioral disturbance: Secondary | ICD-10-CM | POA: Diagnosis not present

## 2020-12-08 DIAGNOSIS — M4800 Spinal stenosis, site unspecified: Secondary | ICD-10-CM | POA: Diagnosis not present

## 2020-12-08 DIAGNOSIS — G311 Senile degeneration of brain, not elsewhere classified: Secondary | ICD-10-CM | POA: Diagnosis not present

## 2020-12-08 DIAGNOSIS — D649 Anemia, unspecified: Secondary | ICD-10-CM | POA: Diagnosis not present

## 2020-12-08 DIAGNOSIS — R296 Repeated falls: Secondary | ICD-10-CM | POA: Diagnosis not present

## 2020-12-08 DIAGNOSIS — I509 Heart failure, unspecified: Secondary | ICD-10-CM | POA: Diagnosis not present

## 2020-12-09 DIAGNOSIS — D649 Anemia, unspecified: Secondary | ICD-10-CM | POA: Diagnosis not present

## 2020-12-09 DIAGNOSIS — M4800 Spinal stenosis, site unspecified: Secondary | ICD-10-CM | POA: Diagnosis not present

## 2020-12-09 DIAGNOSIS — I509 Heart failure, unspecified: Secondary | ICD-10-CM | POA: Diagnosis not present

## 2020-12-09 DIAGNOSIS — F0280 Dementia in other diseases classified elsewhere without behavioral disturbance: Secondary | ICD-10-CM | POA: Diagnosis not present

## 2020-12-09 DIAGNOSIS — G311 Senile degeneration of brain, not elsewhere classified: Secondary | ICD-10-CM | POA: Diagnosis not present

## 2020-12-09 DIAGNOSIS — R296 Repeated falls: Secondary | ICD-10-CM | POA: Diagnosis not present

## 2020-12-10 DIAGNOSIS — F0280 Dementia in other diseases classified elsewhere without behavioral disturbance: Secondary | ICD-10-CM | POA: Diagnosis not present

## 2020-12-10 DIAGNOSIS — D649 Anemia, unspecified: Secondary | ICD-10-CM | POA: Diagnosis not present

## 2020-12-10 DIAGNOSIS — G311 Senile degeneration of brain, not elsewhere classified: Secondary | ICD-10-CM | POA: Diagnosis not present

## 2020-12-10 DIAGNOSIS — I509 Heart failure, unspecified: Secondary | ICD-10-CM | POA: Diagnosis not present

## 2020-12-10 DIAGNOSIS — M4800 Spinal stenosis, site unspecified: Secondary | ICD-10-CM | POA: Diagnosis not present

## 2020-12-10 DIAGNOSIS — R296 Repeated falls: Secondary | ICD-10-CM | POA: Diagnosis not present

## 2020-12-11 DIAGNOSIS — I509 Heart failure, unspecified: Secondary | ICD-10-CM | POA: Diagnosis not present

## 2020-12-11 DIAGNOSIS — F0280 Dementia in other diseases classified elsewhere without behavioral disturbance: Secondary | ICD-10-CM | POA: Diagnosis not present

## 2020-12-11 DIAGNOSIS — R296 Repeated falls: Secondary | ICD-10-CM | POA: Diagnosis not present

## 2020-12-11 DIAGNOSIS — D649 Anemia, unspecified: Secondary | ICD-10-CM | POA: Diagnosis not present

## 2020-12-11 DIAGNOSIS — G311 Senile degeneration of brain, not elsewhere classified: Secondary | ICD-10-CM | POA: Diagnosis not present

## 2020-12-11 DIAGNOSIS — M4800 Spinal stenosis, site unspecified: Secondary | ICD-10-CM | POA: Diagnosis not present

## 2020-12-12 DIAGNOSIS — G311 Senile degeneration of brain, not elsewhere classified: Secondary | ICD-10-CM | POA: Diagnosis not present

## 2020-12-12 DIAGNOSIS — M4800 Spinal stenosis, site unspecified: Secondary | ICD-10-CM | POA: Diagnosis not present

## 2020-12-12 DIAGNOSIS — I509 Heart failure, unspecified: Secondary | ICD-10-CM | POA: Diagnosis not present

## 2020-12-12 DIAGNOSIS — D649 Anemia, unspecified: Secondary | ICD-10-CM | POA: Diagnosis not present

## 2020-12-12 DIAGNOSIS — F0280 Dementia in other diseases classified elsewhere without behavioral disturbance: Secondary | ICD-10-CM | POA: Diagnosis not present

## 2020-12-12 DIAGNOSIS — R296 Repeated falls: Secondary | ICD-10-CM | POA: Diagnosis not present

## 2020-12-13 DIAGNOSIS — R296 Repeated falls: Secondary | ICD-10-CM | POA: Diagnosis not present

## 2020-12-13 DIAGNOSIS — M4800 Spinal stenosis, site unspecified: Secondary | ICD-10-CM | POA: Diagnosis not present

## 2020-12-13 DIAGNOSIS — G311 Senile degeneration of brain, not elsewhere classified: Secondary | ICD-10-CM | POA: Diagnosis not present

## 2020-12-13 DIAGNOSIS — I509 Heart failure, unspecified: Secondary | ICD-10-CM | POA: Diagnosis not present

## 2020-12-13 DIAGNOSIS — D649 Anemia, unspecified: Secondary | ICD-10-CM | POA: Diagnosis not present

## 2020-12-13 DIAGNOSIS — F0280 Dementia in other diseases classified elsewhere without behavioral disturbance: Secondary | ICD-10-CM | POA: Diagnosis not present

## 2020-12-16 DIAGNOSIS — M4800 Spinal stenosis, site unspecified: Secondary | ICD-10-CM | POA: Diagnosis not present

## 2020-12-16 DIAGNOSIS — F0280 Dementia in other diseases classified elsewhere without behavioral disturbance: Secondary | ICD-10-CM | POA: Diagnosis not present

## 2020-12-16 DIAGNOSIS — G311 Senile degeneration of brain, not elsewhere classified: Secondary | ICD-10-CM | POA: Diagnosis not present

## 2020-12-16 DIAGNOSIS — R296 Repeated falls: Secondary | ICD-10-CM | POA: Diagnosis not present

## 2020-12-16 DIAGNOSIS — I509 Heart failure, unspecified: Secondary | ICD-10-CM | POA: Diagnosis not present

## 2020-12-16 DIAGNOSIS — D649 Anemia, unspecified: Secondary | ICD-10-CM | POA: Diagnosis not present

## 2020-12-17 DIAGNOSIS — F0280 Dementia in other diseases classified elsewhere without behavioral disturbance: Secondary | ICD-10-CM | POA: Diagnosis not present

## 2020-12-17 DIAGNOSIS — M4800 Spinal stenosis, site unspecified: Secondary | ICD-10-CM | POA: Diagnosis not present

## 2020-12-17 DIAGNOSIS — D649 Anemia, unspecified: Secondary | ICD-10-CM | POA: Diagnosis not present

## 2020-12-17 DIAGNOSIS — I509 Heart failure, unspecified: Secondary | ICD-10-CM | POA: Diagnosis not present

## 2020-12-17 DIAGNOSIS — R296 Repeated falls: Secondary | ICD-10-CM | POA: Diagnosis not present

## 2020-12-17 DIAGNOSIS — G311 Senile degeneration of brain, not elsewhere classified: Secondary | ICD-10-CM | POA: Diagnosis not present

## 2020-12-18 DIAGNOSIS — M4800 Spinal stenosis, site unspecified: Secondary | ICD-10-CM | POA: Diagnosis not present

## 2020-12-18 DIAGNOSIS — I509 Heart failure, unspecified: Secondary | ICD-10-CM | POA: Diagnosis not present

## 2020-12-18 DIAGNOSIS — R296 Repeated falls: Secondary | ICD-10-CM | POA: Diagnosis not present

## 2020-12-18 DIAGNOSIS — D649 Anemia, unspecified: Secondary | ICD-10-CM | POA: Diagnosis not present

## 2020-12-18 DIAGNOSIS — G311 Senile degeneration of brain, not elsewhere classified: Secondary | ICD-10-CM | POA: Diagnosis not present

## 2020-12-18 DIAGNOSIS — F0280 Dementia in other diseases classified elsewhere without behavioral disturbance: Secondary | ICD-10-CM | POA: Diagnosis not present

## 2020-12-19 DIAGNOSIS — M4800 Spinal stenosis, site unspecified: Secondary | ICD-10-CM | POA: Diagnosis not present

## 2020-12-19 DIAGNOSIS — R296 Repeated falls: Secondary | ICD-10-CM | POA: Diagnosis not present

## 2020-12-19 DIAGNOSIS — I509 Heart failure, unspecified: Secondary | ICD-10-CM | POA: Diagnosis not present

## 2020-12-19 DIAGNOSIS — G311 Senile degeneration of brain, not elsewhere classified: Secondary | ICD-10-CM | POA: Diagnosis not present

## 2020-12-19 DIAGNOSIS — F0280 Dementia in other diseases classified elsewhere without behavioral disturbance: Secondary | ICD-10-CM | POA: Diagnosis not present

## 2020-12-19 DIAGNOSIS — D649 Anemia, unspecified: Secondary | ICD-10-CM | POA: Diagnosis not present

## 2020-12-20 DIAGNOSIS — G311 Senile degeneration of brain, not elsewhere classified: Secondary | ICD-10-CM | POA: Diagnosis not present

## 2020-12-20 DIAGNOSIS — I509 Heart failure, unspecified: Secondary | ICD-10-CM | POA: Diagnosis not present

## 2020-12-20 DIAGNOSIS — R296 Repeated falls: Secondary | ICD-10-CM | POA: Diagnosis not present

## 2020-12-20 DIAGNOSIS — D649 Anemia, unspecified: Secondary | ICD-10-CM | POA: Diagnosis not present

## 2020-12-20 DIAGNOSIS — F0280 Dementia in other diseases classified elsewhere without behavioral disturbance: Secondary | ICD-10-CM | POA: Diagnosis not present

## 2020-12-20 DIAGNOSIS — M4800 Spinal stenosis, site unspecified: Secondary | ICD-10-CM | POA: Diagnosis not present

## 2020-12-23 DIAGNOSIS — D649 Anemia, unspecified: Secondary | ICD-10-CM | POA: Diagnosis not present

## 2020-12-23 DIAGNOSIS — R296 Repeated falls: Secondary | ICD-10-CM | POA: Diagnosis not present

## 2020-12-23 DIAGNOSIS — M4800 Spinal stenosis, site unspecified: Secondary | ICD-10-CM | POA: Diagnosis not present

## 2020-12-23 DIAGNOSIS — F0280 Dementia in other diseases classified elsewhere without behavioral disturbance: Secondary | ICD-10-CM | POA: Diagnosis not present

## 2020-12-23 DIAGNOSIS — G311 Senile degeneration of brain, not elsewhere classified: Secondary | ICD-10-CM | POA: Diagnosis not present

## 2020-12-23 DIAGNOSIS — I509 Heart failure, unspecified: Secondary | ICD-10-CM | POA: Diagnosis not present

## 2020-12-24 DIAGNOSIS — G311 Senile degeneration of brain, not elsewhere classified: Secondary | ICD-10-CM | POA: Diagnosis not present

## 2020-12-24 DIAGNOSIS — I509 Heart failure, unspecified: Secondary | ICD-10-CM | POA: Diagnosis not present

## 2020-12-24 DIAGNOSIS — R296 Repeated falls: Secondary | ICD-10-CM | POA: Diagnosis not present

## 2020-12-24 DIAGNOSIS — F0280 Dementia in other diseases classified elsewhere without behavioral disturbance: Secondary | ICD-10-CM | POA: Diagnosis not present

## 2020-12-24 DIAGNOSIS — D649 Anemia, unspecified: Secondary | ICD-10-CM | POA: Diagnosis not present

## 2020-12-24 DIAGNOSIS — M4800 Spinal stenosis, site unspecified: Secondary | ICD-10-CM | POA: Diagnosis not present

## 2020-12-25 DIAGNOSIS — I509 Heart failure, unspecified: Secondary | ICD-10-CM | POA: Diagnosis not present

## 2020-12-25 DIAGNOSIS — F0280 Dementia in other diseases classified elsewhere without behavioral disturbance: Secondary | ICD-10-CM | POA: Diagnosis not present

## 2020-12-25 DIAGNOSIS — G311 Senile degeneration of brain, not elsewhere classified: Secondary | ICD-10-CM | POA: Diagnosis not present

## 2020-12-25 DIAGNOSIS — M4800 Spinal stenosis, site unspecified: Secondary | ICD-10-CM | POA: Diagnosis not present

## 2020-12-25 DIAGNOSIS — R296 Repeated falls: Secondary | ICD-10-CM | POA: Diagnosis not present

## 2020-12-25 DIAGNOSIS — D649 Anemia, unspecified: Secondary | ICD-10-CM | POA: Diagnosis not present

## 2020-12-26 DIAGNOSIS — R296 Repeated falls: Secondary | ICD-10-CM | POA: Diagnosis not present

## 2020-12-26 DIAGNOSIS — M4800 Spinal stenosis, site unspecified: Secondary | ICD-10-CM | POA: Diagnosis not present

## 2020-12-26 DIAGNOSIS — G311 Senile degeneration of brain, not elsewhere classified: Secondary | ICD-10-CM | POA: Diagnosis not present

## 2020-12-26 DIAGNOSIS — F0280 Dementia in other diseases classified elsewhere without behavioral disturbance: Secondary | ICD-10-CM | POA: Diagnosis not present

## 2020-12-26 DIAGNOSIS — D649 Anemia, unspecified: Secondary | ICD-10-CM | POA: Diagnosis not present

## 2020-12-26 DIAGNOSIS — I509 Heart failure, unspecified: Secondary | ICD-10-CM | POA: Diagnosis not present

## 2020-12-27 DIAGNOSIS — G311 Senile degeneration of brain, not elsewhere classified: Secondary | ICD-10-CM | POA: Diagnosis not present

## 2020-12-27 DIAGNOSIS — D649 Anemia, unspecified: Secondary | ICD-10-CM | POA: Diagnosis not present

## 2020-12-27 DIAGNOSIS — R296 Repeated falls: Secondary | ICD-10-CM | POA: Diagnosis not present

## 2020-12-27 DIAGNOSIS — I509 Heart failure, unspecified: Secondary | ICD-10-CM | POA: Diagnosis not present

## 2020-12-27 DIAGNOSIS — M4800 Spinal stenosis, site unspecified: Secondary | ICD-10-CM | POA: Diagnosis not present

## 2020-12-27 DIAGNOSIS — F0280 Dementia in other diseases classified elsewhere without behavioral disturbance: Secondary | ICD-10-CM | POA: Diagnosis not present

## 2020-12-28 DIAGNOSIS — I509 Heart failure, unspecified: Secondary | ICD-10-CM | POA: Diagnosis not present

## 2020-12-28 DIAGNOSIS — D649 Anemia, unspecified: Secondary | ICD-10-CM | POA: Diagnosis not present

## 2020-12-28 DIAGNOSIS — G311 Senile degeneration of brain, not elsewhere classified: Secondary | ICD-10-CM | POA: Diagnosis not present

## 2020-12-28 DIAGNOSIS — R296 Repeated falls: Secondary | ICD-10-CM | POA: Diagnosis not present

## 2020-12-28 DIAGNOSIS — F0280 Dementia in other diseases classified elsewhere without behavioral disturbance: Secondary | ICD-10-CM | POA: Diagnosis not present

## 2020-12-28 DIAGNOSIS — M4800 Spinal stenosis, site unspecified: Secondary | ICD-10-CM | POA: Diagnosis not present

## 2020-12-29 DIAGNOSIS — I10 Essential (primary) hypertension: Secondary | ICD-10-CM | POA: Diagnosis not present

## 2020-12-29 DIAGNOSIS — M4800 Spinal stenosis, site unspecified: Secondary | ICD-10-CM | POA: Diagnosis not present

## 2020-12-29 DIAGNOSIS — Z515 Encounter for palliative care: Secondary | ICD-10-CM | POA: Diagnosis not present

## 2020-12-29 DIAGNOSIS — F0280 Dementia in other diseases classified elsewhere without behavioral disturbance: Secondary | ICD-10-CM | POA: Diagnosis not present

## 2020-12-29 DIAGNOSIS — D649 Anemia, unspecified: Secondary | ICD-10-CM | POA: Diagnosis not present

## 2020-12-29 DIAGNOSIS — R296 Repeated falls: Secondary | ICD-10-CM | POA: Diagnosis not present

## 2020-12-29 DIAGNOSIS — I509 Heart failure, unspecified: Secondary | ICD-10-CM | POA: Diagnosis not present

## 2020-12-29 DIAGNOSIS — G311 Senile degeneration of brain, not elsewhere classified: Secondary | ICD-10-CM | POA: Diagnosis not present

## 2020-12-30 DIAGNOSIS — M4800 Spinal stenosis, site unspecified: Secondary | ICD-10-CM | POA: Diagnosis not present

## 2020-12-30 DIAGNOSIS — D649 Anemia, unspecified: Secondary | ICD-10-CM | POA: Diagnosis not present

## 2020-12-30 DIAGNOSIS — F0280 Dementia in other diseases classified elsewhere without behavioral disturbance: Secondary | ICD-10-CM | POA: Diagnosis not present

## 2020-12-30 DIAGNOSIS — I509 Heart failure, unspecified: Secondary | ICD-10-CM | POA: Diagnosis not present

## 2020-12-30 DIAGNOSIS — G311 Senile degeneration of brain, not elsewhere classified: Secondary | ICD-10-CM | POA: Diagnosis not present

## 2020-12-30 DIAGNOSIS — R296 Repeated falls: Secondary | ICD-10-CM | POA: Diagnosis not present

## 2020-12-31 DIAGNOSIS — F0280 Dementia in other diseases classified elsewhere without behavioral disturbance: Secondary | ICD-10-CM | POA: Diagnosis not present

## 2020-12-31 DIAGNOSIS — D649 Anemia, unspecified: Secondary | ICD-10-CM | POA: Diagnosis not present

## 2020-12-31 DIAGNOSIS — M4800 Spinal stenosis, site unspecified: Secondary | ICD-10-CM | POA: Diagnosis not present

## 2020-12-31 DIAGNOSIS — I509 Heart failure, unspecified: Secondary | ICD-10-CM | POA: Diagnosis not present

## 2020-12-31 DIAGNOSIS — G311 Senile degeneration of brain, not elsewhere classified: Secondary | ICD-10-CM | POA: Diagnosis not present

## 2020-12-31 DIAGNOSIS — R296 Repeated falls: Secondary | ICD-10-CM | POA: Diagnosis not present

## 2021-01-01 DIAGNOSIS — D649 Anemia, unspecified: Secondary | ICD-10-CM | POA: Diagnosis not present

## 2021-01-01 DIAGNOSIS — I509 Heart failure, unspecified: Secondary | ICD-10-CM | POA: Diagnosis not present

## 2021-01-01 DIAGNOSIS — G311 Senile degeneration of brain, not elsewhere classified: Secondary | ICD-10-CM | POA: Diagnosis not present

## 2021-01-01 DIAGNOSIS — M4800 Spinal stenosis, site unspecified: Secondary | ICD-10-CM | POA: Diagnosis not present

## 2021-01-01 DIAGNOSIS — R296 Repeated falls: Secondary | ICD-10-CM | POA: Diagnosis not present

## 2021-01-01 DIAGNOSIS — F0280 Dementia in other diseases classified elsewhere without behavioral disturbance: Secondary | ICD-10-CM | POA: Diagnosis not present

## 2021-01-02 DIAGNOSIS — D649 Anemia, unspecified: Secondary | ICD-10-CM | POA: Diagnosis not present

## 2021-01-02 DIAGNOSIS — F0280 Dementia in other diseases classified elsewhere without behavioral disturbance: Secondary | ICD-10-CM | POA: Diagnosis not present

## 2021-01-02 DIAGNOSIS — M4800 Spinal stenosis, site unspecified: Secondary | ICD-10-CM | POA: Diagnosis not present

## 2021-01-02 DIAGNOSIS — G311 Senile degeneration of brain, not elsewhere classified: Secondary | ICD-10-CM | POA: Diagnosis not present

## 2021-01-02 DIAGNOSIS — R296 Repeated falls: Secondary | ICD-10-CM | POA: Diagnosis not present

## 2021-01-02 DIAGNOSIS — I509 Heart failure, unspecified: Secondary | ICD-10-CM | POA: Diagnosis not present

## 2021-01-03 DIAGNOSIS — R296 Repeated falls: Secondary | ICD-10-CM | POA: Diagnosis not present

## 2021-01-03 DIAGNOSIS — I509 Heart failure, unspecified: Secondary | ICD-10-CM | POA: Diagnosis not present

## 2021-01-03 DIAGNOSIS — M4800 Spinal stenosis, site unspecified: Secondary | ICD-10-CM | POA: Diagnosis not present

## 2021-01-03 DIAGNOSIS — D649 Anemia, unspecified: Secondary | ICD-10-CM | POA: Diagnosis not present

## 2021-01-03 DIAGNOSIS — F0280 Dementia in other diseases classified elsewhere without behavioral disturbance: Secondary | ICD-10-CM | POA: Diagnosis not present

## 2021-01-03 DIAGNOSIS — G311 Senile degeneration of brain, not elsewhere classified: Secondary | ICD-10-CM | POA: Diagnosis not present

## 2021-01-06 DIAGNOSIS — R296 Repeated falls: Secondary | ICD-10-CM | POA: Diagnosis not present

## 2021-01-06 DIAGNOSIS — M4800 Spinal stenosis, site unspecified: Secondary | ICD-10-CM | POA: Diagnosis not present

## 2021-01-06 DIAGNOSIS — G311 Senile degeneration of brain, not elsewhere classified: Secondary | ICD-10-CM | POA: Diagnosis not present

## 2021-01-06 DIAGNOSIS — D649 Anemia, unspecified: Secondary | ICD-10-CM | POA: Diagnosis not present

## 2021-01-06 DIAGNOSIS — F0280 Dementia in other diseases classified elsewhere without behavioral disturbance: Secondary | ICD-10-CM | POA: Diagnosis not present

## 2021-01-06 DIAGNOSIS — I509 Heart failure, unspecified: Secondary | ICD-10-CM | POA: Diagnosis not present

## 2021-01-07 DIAGNOSIS — D649 Anemia, unspecified: Secondary | ICD-10-CM | POA: Diagnosis not present

## 2021-01-07 DIAGNOSIS — G311 Senile degeneration of brain, not elsewhere classified: Secondary | ICD-10-CM | POA: Diagnosis not present

## 2021-01-07 DIAGNOSIS — R296 Repeated falls: Secondary | ICD-10-CM | POA: Diagnosis not present

## 2021-01-07 DIAGNOSIS — M4800 Spinal stenosis, site unspecified: Secondary | ICD-10-CM | POA: Diagnosis not present

## 2021-01-07 DIAGNOSIS — I509 Heart failure, unspecified: Secondary | ICD-10-CM | POA: Diagnosis not present

## 2021-01-07 DIAGNOSIS — F0280 Dementia in other diseases classified elsewhere without behavioral disturbance: Secondary | ICD-10-CM | POA: Diagnosis not present

## 2021-01-08 DIAGNOSIS — F0280 Dementia in other diseases classified elsewhere without behavioral disturbance: Secondary | ICD-10-CM | POA: Diagnosis not present

## 2021-01-08 DIAGNOSIS — R296 Repeated falls: Secondary | ICD-10-CM | POA: Diagnosis not present

## 2021-01-08 DIAGNOSIS — D649 Anemia, unspecified: Secondary | ICD-10-CM | POA: Diagnosis not present

## 2021-01-08 DIAGNOSIS — I509 Heart failure, unspecified: Secondary | ICD-10-CM | POA: Diagnosis not present

## 2021-01-08 DIAGNOSIS — M4800 Spinal stenosis, site unspecified: Secondary | ICD-10-CM | POA: Diagnosis not present

## 2021-01-08 DIAGNOSIS — G311 Senile degeneration of brain, not elsewhere classified: Secondary | ICD-10-CM | POA: Diagnosis not present

## 2021-01-09 DIAGNOSIS — R296 Repeated falls: Secondary | ICD-10-CM | POA: Diagnosis not present

## 2021-01-09 DIAGNOSIS — D649 Anemia, unspecified: Secondary | ICD-10-CM | POA: Diagnosis not present

## 2021-01-09 DIAGNOSIS — I509 Heart failure, unspecified: Secondary | ICD-10-CM | POA: Diagnosis not present

## 2021-01-09 DIAGNOSIS — M4800 Spinal stenosis, site unspecified: Secondary | ICD-10-CM | POA: Diagnosis not present

## 2021-01-09 DIAGNOSIS — G311 Senile degeneration of brain, not elsewhere classified: Secondary | ICD-10-CM | POA: Diagnosis not present

## 2021-01-09 DIAGNOSIS — F0280 Dementia in other diseases classified elsewhere without behavioral disturbance: Secondary | ICD-10-CM | POA: Diagnosis not present

## 2021-01-10 DIAGNOSIS — G311 Senile degeneration of brain, not elsewhere classified: Secondary | ICD-10-CM | POA: Diagnosis not present

## 2021-01-10 DIAGNOSIS — F0280 Dementia in other diseases classified elsewhere without behavioral disturbance: Secondary | ICD-10-CM | POA: Diagnosis not present

## 2021-01-10 DIAGNOSIS — D649 Anemia, unspecified: Secondary | ICD-10-CM | POA: Diagnosis not present

## 2021-01-10 DIAGNOSIS — M4800 Spinal stenosis, site unspecified: Secondary | ICD-10-CM | POA: Diagnosis not present

## 2021-01-10 DIAGNOSIS — R296 Repeated falls: Secondary | ICD-10-CM | POA: Diagnosis not present

## 2021-01-10 DIAGNOSIS — I509 Heart failure, unspecified: Secondary | ICD-10-CM | POA: Diagnosis not present

## 2021-01-13 DIAGNOSIS — I509 Heart failure, unspecified: Secondary | ICD-10-CM | POA: Diagnosis not present

## 2021-01-13 DIAGNOSIS — D649 Anemia, unspecified: Secondary | ICD-10-CM | POA: Diagnosis not present

## 2021-01-13 DIAGNOSIS — F0280 Dementia in other diseases classified elsewhere without behavioral disturbance: Secondary | ICD-10-CM | POA: Diagnosis not present

## 2021-01-13 DIAGNOSIS — M4800 Spinal stenosis, site unspecified: Secondary | ICD-10-CM | POA: Diagnosis not present

## 2021-01-13 DIAGNOSIS — G311 Senile degeneration of brain, not elsewhere classified: Secondary | ICD-10-CM | POA: Diagnosis not present

## 2021-01-13 DIAGNOSIS — R296 Repeated falls: Secondary | ICD-10-CM | POA: Diagnosis not present

## 2021-01-14 DIAGNOSIS — R296 Repeated falls: Secondary | ICD-10-CM | POA: Diagnosis not present

## 2021-01-14 DIAGNOSIS — D649 Anemia, unspecified: Secondary | ICD-10-CM | POA: Diagnosis not present

## 2021-01-14 DIAGNOSIS — F0280 Dementia in other diseases classified elsewhere without behavioral disturbance: Secondary | ICD-10-CM | POA: Diagnosis not present

## 2021-01-14 DIAGNOSIS — G311 Senile degeneration of brain, not elsewhere classified: Secondary | ICD-10-CM | POA: Diagnosis not present

## 2021-01-14 DIAGNOSIS — M4800 Spinal stenosis, site unspecified: Secondary | ICD-10-CM | POA: Diagnosis not present

## 2021-01-14 DIAGNOSIS — I509 Heart failure, unspecified: Secondary | ICD-10-CM | POA: Diagnosis not present

## 2021-01-15 DIAGNOSIS — F0280 Dementia in other diseases classified elsewhere without behavioral disturbance: Secondary | ICD-10-CM | POA: Diagnosis not present

## 2021-01-15 DIAGNOSIS — D649 Anemia, unspecified: Secondary | ICD-10-CM | POA: Diagnosis not present

## 2021-01-15 DIAGNOSIS — I509 Heart failure, unspecified: Secondary | ICD-10-CM | POA: Diagnosis not present

## 2021-01-15 DIAGNOSIS — R296 Repeated falls: Secondary | ICD-10-CM | POA: Diagnosis not present

## 2021-01-15 DIAGNOSIS — M4800 Spinal stenosis, site unspecified: Secondary | ICD-10-CM | POA: Diagnosis not present

## 2021-01-15 DIAGNOSIS — G311 Senile degeneration of brain, not elsewhere classified: Secondary | ICD-10-CM | POA: Diagnosis not present

## 2021-01-16 DIAGNOSIS — R296 Repeated falls: Secondary | ICD-10-CM | POA: Diagnosis not present

## 2021-01-16 DIAGNOSIS — G311 Senile degeneration of brain, not elsewhere classified: Secondary | ICD-10-CM | POA: Diagnosis not present

## 2021-01-16 DIAGNOSIS — D649 Anemia, unspecified: Secondary | ICD-10-CM | POA: Diagnosis not present

## 2021-01-16 DIAGNOSIS — I509 Heart failure, unspecified: Secondary | ICD-10-CM | POA: Diagnosis not present

## 2021-01-16 DIAGNOSIS — F0280 Dementia in other diseases classified elsewhere without behavioral disturbance: Secondary | ICD-10-CM | POA: Diagnosis not present

## 2021-01-16 DIAGNOSIS — M4800 Spinal stenosis, site unspecified: Secondary | ICD-10-CM | POA: Diagnosis not present

## 2021-01-17 DIAGNOSIS — I509 Heart failure, unspecified: Secondary | ICD-10-CM | POA: Diagnosis not present

## 2021-01-17 DIAGNOSIS — F0280 Dementia in other diseases classified elsewhere without behavioral disturbance: Secondary | ICD-10-CM | POA: Diagnosis not present

## 2021-01-17 DIAGNOSIS — R296 Repeated falls: Secondary | ICD-10-CM | POA: Diagnosis not present

## 2021-01-17 DIAGNOSIS — D649 Anemia, unspecified: Secondary | ICD-10-CM | POA: Diagnosis not present

## 2021-01-17 DIAGNOSIS — M4800 Spinal stenosis, site unspecified: Secondary | ICD-10-CM | POA: Diagnosis not present

## 2021-01-17 DIAGNOSIS — G311 Senile degeneration of brain, not elsewhere classified: Secondary | ICD-10-CM | POA: Diagnosis not present

## 2021-02-01 DIAGNOSIS — R7309 Other abnormal glucose: Secondary | ICD-10-CM | POA: Diagnosis not present

## 2021-02-01 DIAGNOSIS — I10 Essential (primary) hypertension: Secondary | ICD-10-CM | POA: Diagnosis not present

## 2021-02-01 DIAGNOSIS — M130 Polyarthritis, unspecified: Secondary | ICD-10-CM | POA: Diagnosis not present

## 2021-02-01 DIAGNOSIS — R4182 Altered mental status, unspecified: Secondary | ICD-10-CM | POA: Diagnosis not present

## 2021-02-01 DIAGNOSIS — F064 Anxiety disorder due to known physiological condition: Secondary | ICD-10-CM | POA: Diagnosis not present

## 2021-02-01 DIAGNOSIS — M48062 Spinal stenosis, lumbar region with neurogenic claudication: Secondary | ICD-10-CM | POA: Diagnosis not present

## 2021-02-04 DIAGNOSIS — M130 Polyarthritis, unspecified: Secondary | ICD-10-CM | POA: Diagnosis not present

## 2021-02-04 DIAGNOSIS — E1169 Type 2 diabetes mellitus with other specified complication: Secondary | ICD-10-CM | POA: Diagnosis not present

## 2021-02-04 DIAGNOSIS — R4182 Altered mental status, unspecified: Secondary | ICD-10-CM | POA: Diagnosis not present

## 2021-02-04 DIAGNOSIS — I10 Essential (primary) hypertension: Secondary | ICD-10-CM | POA: Diagnosis not present

## 2021-02-04 DIAGNOSIS — E559 Vitamin D deficiency, unspecified: Secondary | ICD-10-CM | POA: Diagnosis not present

## 2021-02-04 DIAGNOSIS — R634 Abnormal weight loss: Secondary | ICD-10-CM | POA: Diagnosis not present

## 2021-03-06 DIAGNOSIS — Z515 Encounter for palliative care: Secondary | ICD-10-CM | POA: Diagnosis not present

## 2021-03-06 DIAGNOSIS — F0150 Vascular dementia without behavioral disturbance: Secondary | ICD-10-CM | POA: Diagnosis not present

## 2021-04-03 DIAGNOSIS — F0150 Vascular dementia without behavioral disturbance: Secondary | ICD-10-CM | POA: Diagnosis not present

## 2021-04-03 DIAGNOSIS — Z515 Encounter for palliative care: Secondary | ICD-10-CM | POA: Diagnosis not present

## 2021-07-12 IMAGING — CT CT HIP*R* W/O CM
2 of 3 series · 16 of 46 positions shown, 18 images · non-contrast
Comparison: Radiographs dated 05/18/2019 and 05/23/2019

CLINICAL DATA: Right hip pain for 1 week. Right hip arthroplasty.

EXAM:
CT OF THE RIGHT HIP WITHOUT CONTRAST
TECHNIQUE: Multidetector CT imaging of the right hip was performed according to
the standard protocol. Multiplanar CT image reconstructions were
also generated.

[Series 5: axial (person_name) · axial · 0.57mm/px · z∈[+738,+1022]mm · 13 of 164 slices shown, 15 images]
[im 11/164  soft-tissue]
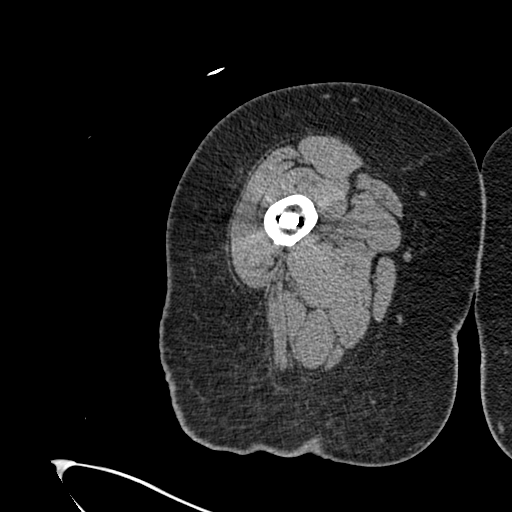
[im 11/164  bone]
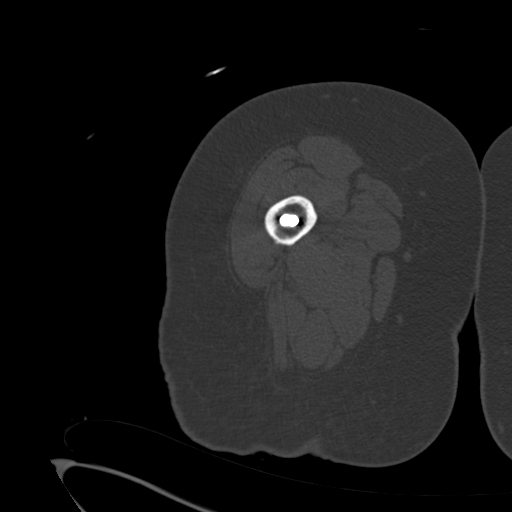
[im 22/164  soft-tissue]
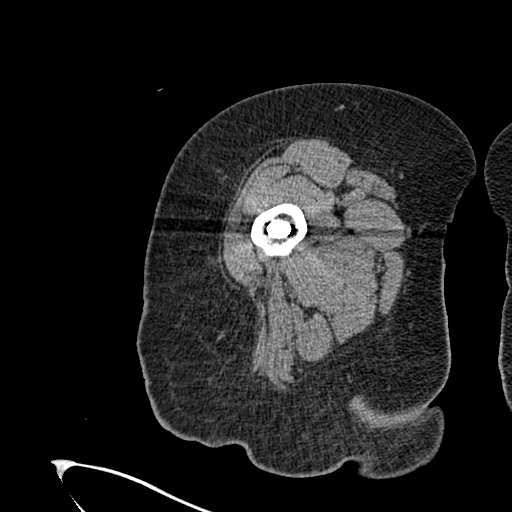
[im 32/164  soft-tissue]
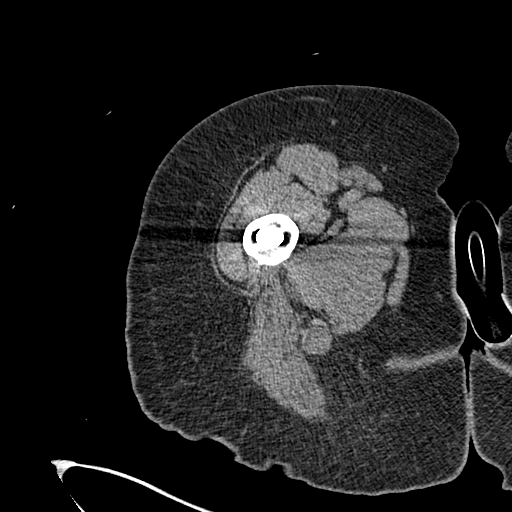
[im 48/164  soft-tissue]
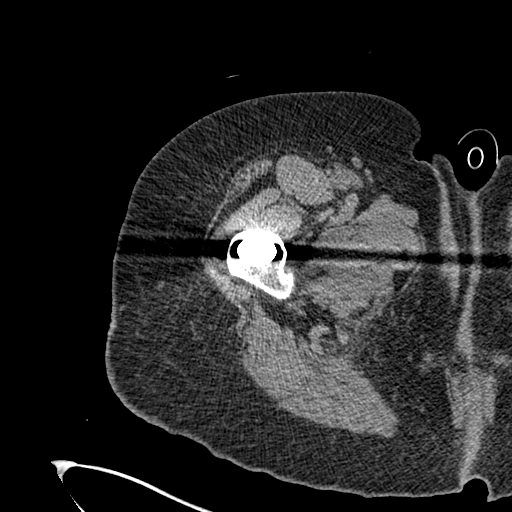
[im 58/164  soft-tissue]
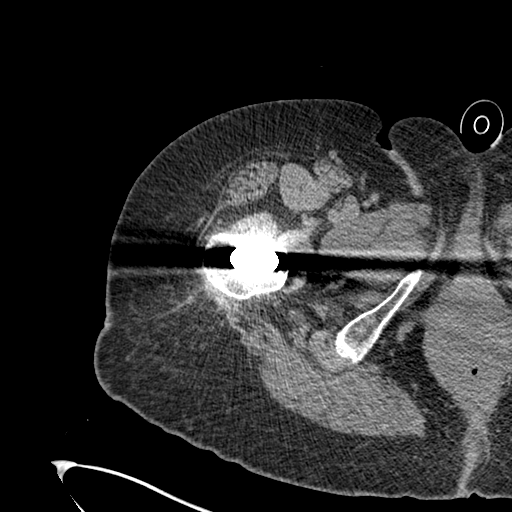
[im 69/164  soft-tissue]
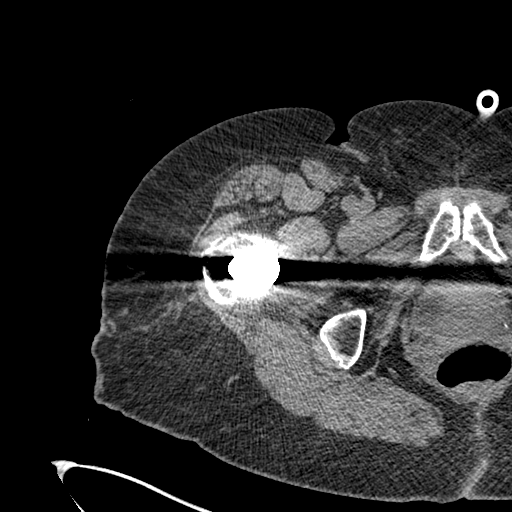
[im 85/164  soft-tissue]
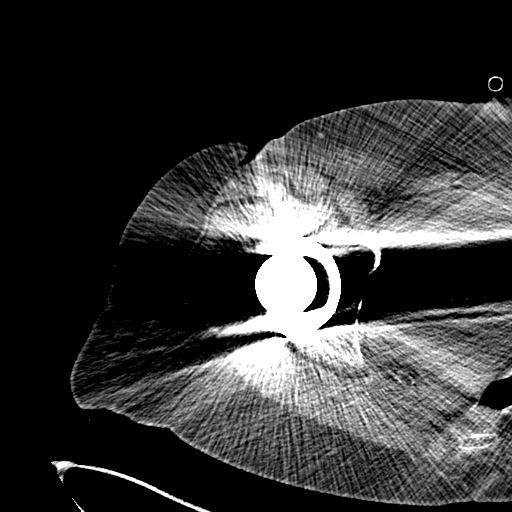
[im 95/164  soft-tissue]
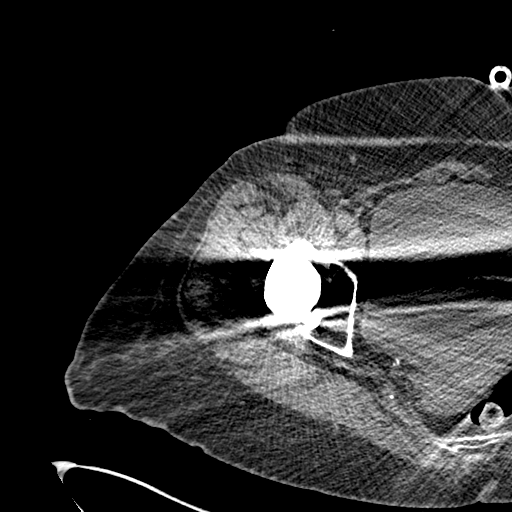
[im 106/164  soft-tissue]
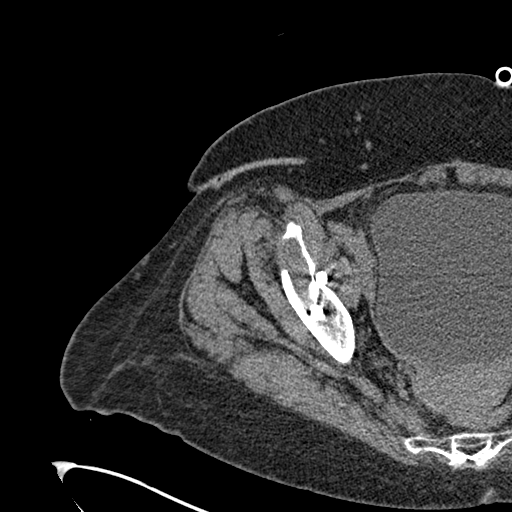
[im 106/164  bone]
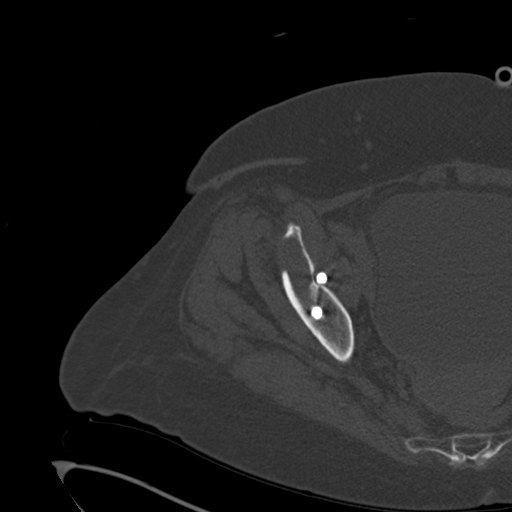
[im 116/164  soft-tissue]
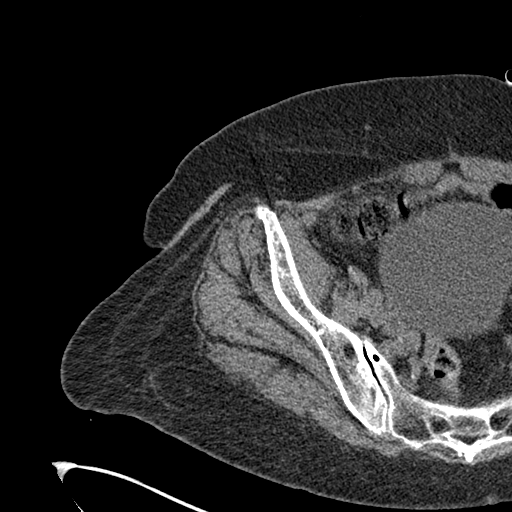
[im 132/164  soft-tissue]
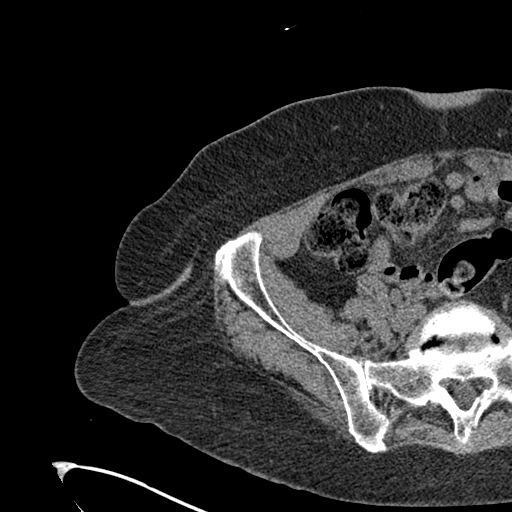
[im 142/164  soft-tissue]
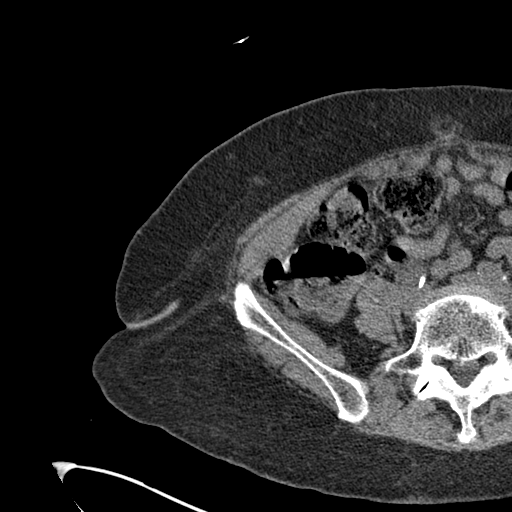
[im 153/164  soft-tissue]
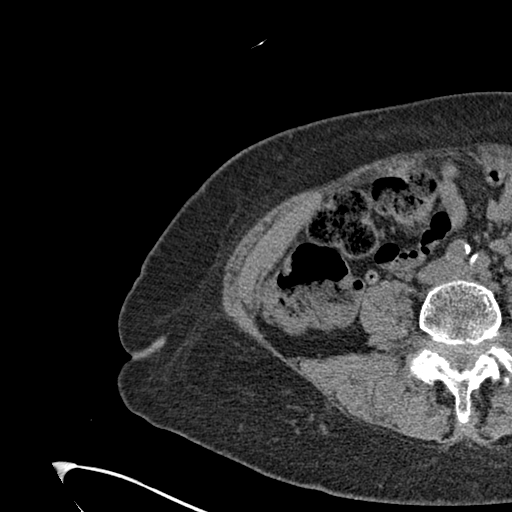

[Series 10: coronal st · coronal · 0.49mm/px · 3 of 125 slices shown]
[im 42/125  soft-tissue]
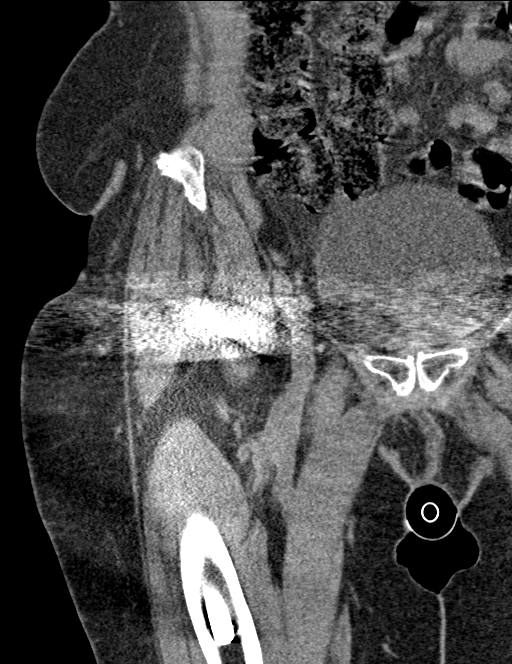
[im 56/125  soft-tissue]
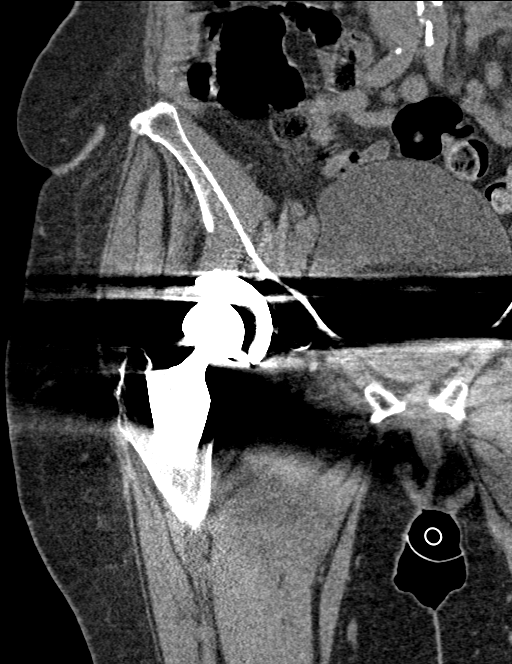
[im 69/125  soft-tissue]
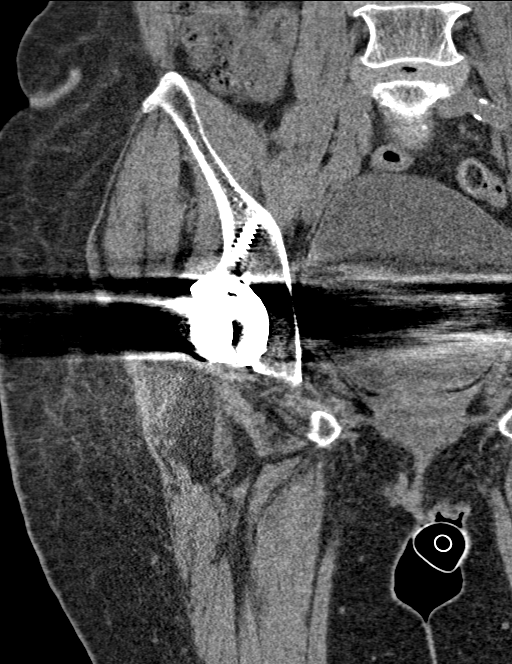

[16 of 46 positions shown; findings below may reference images not displayed]

FINDINGS: Bones/Joint/Cartilage

There is a 4 x 4 x 2.5 cm lesion in the anterior superolateral
aspect of the right acetabulum with a 2 x 3 cm lateral cortical
defect which involves the superior aspect of the acetabulum and the
lateral aspect of the ischemic just above the acetabulum. Soft
tissue fills this lesion.

The components of the total hip prosthesis appear in good position.
No loosening of the components.

Muscles and Tendons

Normal.

Soft tissues

Severe spinal stenosis is present at L4-5 due to a broad-based disc
protrusion and hypertrophy of the ligamentum flavum and facet
joints. The protrusion extends into both neural foramina and could
affect either or both L4 nerves in the neural foramina.
IMPRESSION: 1. 4 x 4 x 2.5 cm lesion in the anterior superolateral aspect of the
right acetabulum with a 2 x 3 cm lateral cortical defect. This
likely represents benign particle disease from the patient's right
hip prosthesis.
2. Severe spinal stenosis at L4-5 due to a broad-based disc
protrusion and hypertrophy of the ligamentum flavum and facet
joints.
3. The disc protrusion extends into both neural foramina and could
affect either or both L4 nerves in the neural foramina.

## 2021-08-19 DIAGNOSIS — M48062 Spinal stenosis, lumbar region with neurogenic claudication: Secondary | ICD-10-CM | POA: Diagnosis not present

## 2021-08-19 DIAGNOSIS — F411 Generalized anxiety disorder: Secondary | ICD-10-CM | POA: Diagnosis not present

## 2021-08-19 DIAGNOSIS — I10 Essential (primary) hypertension: Secondary | ICD-10-CM | POA: Diagnosis not present

## 2021-08-29 DIAGNOSIS — Z515 Encounter for palliative care: Secondary | ICD-10-CM | POA: Diagnosis not present

## 2021-08-29 DIAGNOSIS — F0150 Vascular dementia without behavioral disturbance: Secondary | ICD-10-CM | POA: Diagnosis not present

## 2021-09-18 DIAGNOSIS — F0150 Vascular dementia without behavioral disturbance: Secondary | ICD-10-CM | POA: Diagnosis not present

## 2021-09-18 DIAGNOSIS — Z515 Encounter for palliative care: Secondary | ICD-10-CM | POA: Diagnosis not present

## 2021-10-31 DIAGNOSIS — F0150 Vascular dementia without behavioral disturbance: Secondary | ICD-10-CM | POA: Diagnosis not present

## 2021-10-31 DIAGNOSIS — Z515 Encounter for palliative care: Secondary | ICD-10-CM | POA: Diagnosis not present

## 2021-12-05 DIAGNOSIS — F0150 Vascular dementia without behavioral disturbance: Secondary | ICD-10-CM | POA: Diagnosis not present

## 2021-12-05 DIAGNOSIS — Z515 Encounter for palliative care: Secondary | ICD-10-CM | POA: Diagnosis not present

## 2022-01-16 DIAGNOSIS — Z515 Encounter for palliative care: Secondary | ICD-10-CM | POA: Diagnosis not present

## 2022-01-16 DIAGNOSIS — F0150 Vascular dementia without behavioral disturbance: Secondary | ICD-10-CM | POA: Diagnosis not present

## 2022-04-10 DIAGNOSIS — F064 Anxiety disorder due to known physiological condition: Secondary | ICD-10-CM | POA: Diagnosis not present

## 2022-04-10 DIAGNOSIS — Z515 Encounter for palliative care: Secondary | ICD-10-CM | POA: Diagnosis not present

## 2022-04-10 DIAGNOSIS — Z7401 Bed confinement status: Secondary | ICD-10-CM | POA: Diagnosis not present

## 2022-04-10 DIAGNOSIS — I10 Essential (primary) hypertension: Secondary | ICD-10-CM | POA: Diagnosis not present

## 2022-04-10 DIAGNOSIS — F0150 Vascular dementia without behavioral disturbance: Secondary | ICD-10-CM | POA: Diagnosis not present

## 2023-04-06 NOTE — Congregational Nurse Program (Unsigned)
Today, during my visit, I attended to Ms. Brenda Kerr, an 82 year old woman who is bedridden and facing significant health challenges. She exhibits minimal verbal response and has difficulty with intake. Upon examination, I observed that her eyes are matted with thick white discharge, indicating a potential eye health issue. It's noteworthy that her husband serves as her primary caregiver, diligently providing support. Additionally, their five daughters contribute to her care, demonstrating a strong familial support system. Given [name] declining health, I broached the topic with her daughters, suggesting that it may be appropriate to consider relocating her to a facility where she can receive more comprehensive care and support.

## 2024-05-07 ENCOUNTER — Inpatient Hospital Stay (HOSPITAL_COMMUNITY)
Admission: EM | Admit: 2024-05-07 | Discharge: 2024-05-15 | DRG: 299 | Disposition: A | Discharge status: Hospice | Attending: Internal Medicine | Admitting: Internal Medicine

## 2024-05-07 ENCOUNTER — Encounter (HOSPITAL_COMMUNITY): Payer: Self-pay

## 2024-05-07 ENCOUNTER — Other Ambulatory Visit: Payer: Self-pay

## 2024-05-07 ENCOUNTER — Emergency Department (HOSPITAL_COMMUNITY)

## 2024-05-07 DIAGNOSIS — L97929 Non-pressure chronic ulcer of unspecified part of left lower leg with unspecified severity: Secondary | ICD-10-CM | POA: Diagnosis not present

## 2024-05-07 DIAGNOSIS — Z7189 Other specified counseling: Secondary | ICD-10-CM | POA: Diagnosis not present

## 2024-05-07 DIAGNOSIS — N390 Urinary tract infection, site not specified: Secondary | ICD-10-CM | POA: Diagnosis present

## 2024-05-07 DIAGNOSIS — Z79899 Other long term (current) drug therapy: Secondary | ICD-10-CM

## 2024-05-07 DIAGNOSIS — L89150 Pressure ulcer of sacral region, unstageable: Secondary | ICD-10-CM | POA: Diagnosis present

## 2024-05-07 DIAGNOSIS — R64 Cachexia: Secondary | ICD-10-CM | POA: Diagnosis present

## 2024-05-07 DIAGNOSIS — D649 Anemia, unspecified: Secondary | ICD-10-CM | POA: Diagnosis present

## 2024-05-07 DIAGNOSIS — L89159 Pressure ulcer of sacral region, unspecified stage: Secondary | ICD-10-CM | POA: Diagnosis present

## 2024-05-07 DIAGNOSIS — Z789 Other specified health status: Secondary | ICD-10-CM | POA: Diagnosis not present

## 2024-05-07 DIAGNOSIS — I96 Gangrene, not elsewhere classified: Principal | ICD-10-CM | POA: Diagnosis present

## 2024-05-07 DIAGNOSIS — Z7401 Bed confinement status: Secondary | ICD-10-CM | POA: Diagnosis not present

## 2024-05-07 DIAGNOSIS — I5030 Unspecified diastolic (congestive) heart failure: Secondary | ICD-10-CM | POA: Diagnosis present

## 2024-05-07 DIAGNOSIS — L97919 Non-pressure chronic ulcer of unspecified part of right lower leg with unspecified severity: Secondary | ICD-10-CM | POA: Diagnosis not present

## 2024-05-07 DIAGNOSIS — Z515 Encounter for palliative care: Secondary | ICD-10-CM | POA: Diagnosis not present

## 2024-05-07 DIAGNOSIS — E43 Unspecified severe protein-calorie malnutrition: Secondary | ICD-10-CM | POA: Diagnosis present

## 2024-05-07 DIAGNOSIS — E876 Hypokalemia: Secondary | ICD-10-CM | POA: Diagnosis present

## 2024-05-07 DIAGNOSIS — E1152 Type 2 diabetes mellitus with diabetic peripheral angiopathy with gangrene: Principal | ICD-10-CM | POA: Diagnosis present

## 2024-05-07 DIAGNOSIS — I83025 Varicose veins of left lower extremity with ulcer other part of foot: Secondary | ICD-10-CM | POA: Diagnosis present

## 2024-05-07 DIAGNOSIS — Z66 Do not resuscitate: Secondary | ICD-10-CM | POA: Diagnosis present

## 2024-05-07 DIAGNOSIS — I83019 Varicose veins of right lower extremity with ulcer of unspecified site: Secondary | ICD-10-CM | POA: Diagnosis not present

## 2024-05-07 DIAGNOSIS — I872 Venous insufficiency (chronic) (peripheral): Secondary | ICD-10-CM | POA: Diagnosis present

## 2024-05-07 DIAGNOSIS — I110 Hypertensive heart disease with heart failure: Secondary | ICD-10-CM | POA: Diagnosis present

## 2024-05-07 DIAGNOSIS — K219 Gastro-esophageal reflux disease without esophagitis: Secondary | ICD-10-CM | POA: Diagnosis present

## 2024-05-07 DIAGNOSIS — I83015 Varicose veins of right lower extremity with ulcer other part of foot: Secondary | ICD-10-CM | POA: Diagnosis present

## 2024-05-07 DIAGNOSIS — Z1152 Encounter for screening for COVID-19: Secondary | ICD-10-CM

## 2024-05-07 DIAGNOSIS — F0390 Unspecified dementia without behavioral disturbance: Secondary | ICD-10-CM | POA: Diagnosis present

## 2024-05-07 DIAGNOSIS — I5032 Chronic diastolic (congestive) heart failure: Secondary | ICD-10-CM | POA: Diagnosis present

## 2024-05-07 DIAGNOSIS — L97519 Non-pressure chronic ulcer of other part of right foot with unspecified severity: Secondary | ICD-10-CM | POA: Diagnosis present

## 2024-05-07 DIAGNOSIS — Z885 Allergy status to narcotic agent status: Secondary | ICD-10-CM | POA: Diagnosis not present

## 2024-05-07 DIAGNOSIS — Z681 Body mass index (BMI) 19 or less, adult: Secondary | ICD-10-CM | POA: Diagnosis not present

## 2024-05-07 DIAGNOSIS — R627 Adult failure to thrive: Secondary | ICD-10-CM | POA: Diagnosis present

## 2024-05-07 DIAGNOSIS — I959 Hypotension, unspecified: Secondary | ICD-10-CM | POA: Diagnosis not present

## 2024-05-07 DIAGNOSIS — I10 Essential (primary) hypertension: Secondary | ICD-10-CM | POA: Diagnosis present

## 2024-05-07 DIAGNOSIS — R410 Disorientation, unspecified: Secondary | ICD-10-CM | POA: Diagnosis not present

## 2024-05-07 DIAGNOSIS — Z96642 Presence of left artificial hip joint: Secondary | ICD-10-CM | POA: Diagnosis present

## 2024-05-07 DIAGNOSIS — I83029 Varicose veins of left lower extremity with ulcer of unspecified site: Secondary | ICD-10-CM | POA: Diagnosis not present

## 2024-05-07 DIAGNOSIS — H409 Unspecified glaucoma: Secondary | ICD-10-CM | POA: Diagnosis present

## 2024-05-07 LAB — URINALYSIS, W/ REFLEX TO CULTURE (INFECTION SUSPECTED)
Bilirubin Urine: NEGATIVE
Glucose, UA: NEGATIVE mg/dL
Hgb urine dipstick: NEGATIVE
Ketones, ur: NEGATIVE mg/dL
Nitrite: NEGATIVE
Protein, ur: NEGATIVE mg/dL
Specific Gravity, Urine: 1.014 (ref 1.005–1.030)
pH: 5 (ref 5.0–8.0)

## 2024-05-07 LAB — COMPREHENSIVE METABOLIC PANEL WITH GFR
ALT: 10 U/L (ref 0–44)
AST: 13 U/L — ABNORMAL LOW (ref 15–41)
Albumin: 2.4 g/dL — ABNORMAL LOW (ref 3.5–5.0)
Alkaline Phosphatase: 72 U/L (ref 38–126)
Anion gap: 8 (ref 5–15)
BUN: 9 mg/dL (ref 8–23)
CO2: 27 mmol/L (ref 22–32)
Calcium: 8.5 mg/dL — ABNORMAL LOW (ref 8.9–10.3)
Chloride: 102 mmol/L (ref 98–111)
Creatinine, Ser: 0.52 mg/dL (ref 0.44–1.00)
GFR, Estimated: >60 mL/min (ref >60)
Glucose, Bld: 93 mg/dL (ref 70–99)
Potassium: 3.3 mmol/L — ABNORMAL LOW (ref 3.5–5.1)
Sodium: 137 mmol/L (ref 135–145)
Total Bilirubin: 0.7 mg/dL (ref 0.0–1.2)
Total Protein: 6.5 g/dL (ref 6.5–8.1)

## 2024-05-07 LAB — CBC WITH DIFFERENTIAL/PLATELET
Abs Immature Granulocytes: 0.04 K/uL (ref 0.00–0.07)
Basophils Absolute: 0.1 K/uL (ref 0.0–0.1)
Basophils Relative: 0 %
Eosinophils Absolute: 0.1 K/uL (ref 0.0–0.5)
Eosinophils Relative: 1 %
HCT: 30.9 % — ABNORMAL LOW (ref 36.0–46.0)
Hemoglobin: 9.9 g/dL — ABNORMAL LOW (ref 12.0–15.0)
Immature Granulocytes: 0 %
Lymphocytes Relative: 7 %
Lymphs Abs: 0.9 K/uL (ref 0.7–4.0)
MCH: 28.4 pg (ref 26.0–34.0)
MCHC: 32 g/dL (ref 30.0–36.0)
MCV: 88.5 fL (ref 80.0–100.0)
Monocytes Absolute: 0.7 K/uL (ref 0.1–1.0)
Monocytes Relative: 6 %
Neutro Abs: 9.9 K/uL — ABNORMAL HIGH (ref 1.7–7.7)
Neutrophils Relative %: 86 %
Platelets: 315 K/uL (ref 150–400)
RBC: 3.49 MIL/uL — ABNORMAL LOW (ref 3.87–5.11)
RDW: 13.2 % (ref 11.5–15.5)
WBC: 11.6 K/uL — ABNORMAL HIGH (ref 4.0–10.5)
nRBC: 0 % (ref 0.0–0.2)

## 2024-05-07 LAB — FERRITIN: Ferritin: 161 ng/mL (ref 11–307)

## 2024-05-07 LAB — IRON AND TIBC
Iron: 10 ug/dL — ABNORMAL LOW (ref 28–170)
Saturation Ratios: 5 % — ABNORMAL LOW (ref 10.4–31.8)
TIBC: 205 ug/dL — ABNORMAL LOW (ref 250–450)
UIBC: 195 ug/dL

## 2024-05-07 LAB — RESP PANEL BY RT-PCR (RSV, FLU A&B, COVID)  RVPGX2
Influenza A by PCR: NEGATIVE
Influenza B by PCR: NEGATIVE
Resp Syncytial Virus by PCR: NEGATIVE
SARS Coronavirus 2 by RT PCR: NEGATIVE

## 2024-05-07 LAB — C-REACTIVE PROTEIN: CRP: 9.6 mg/dL — ABNORMAL HIGH (ref <1.0)

## 2024-05-07 LAB — LACTIC ACID, PLASMA
Lactic Acid, Venous: 0.7 mmol/L (ref 0.5–1.9)
Lactic Acid, Venous: 0.9 mmol/L (ref 0.5–1.9)

## 2024-05-07 LAB — PREALBUMIN: Prealbumin: 5 mg/dL — ABNORMAL LOW (ref 18–38)

## 2024-05-07 LAB — PROTIME-INR
INR: 1.1 (ref 0.8–1.2)
Prothrombin Time: 14.3 s (ref 11.4–15.2)

## 2024-05-07 LAB — HEMOGLOBIN A1C
Hgb A1c MFr Bld: 5.3 % (ref 4.8–5.6)
Mean Plasma Glucose: 105.41 mg/dL

## 2024-05-07 LAB — VITAMIN B12: Vitamin B-12: 253 pg/mL (ref 180–914)

## 2024-05-07 MED ORDER — SODIUM CHLORIDE 0.9 % IV BOLUS
2000.0000 mL | Freq: Once | INTRAVENOUS | Status: AC
  Administered 2024-05-07: 2000 mL via INTRAVENOUS

## 2024-05-07 MED ORDER — TIMOLOL MALEATE 0.5 % OP SOLN
1.0000 [drp] | Freq: Two times a day (BID) | OPHTHALMIC | Status: DC
  Administered 2024-05-07 – 2024-05-15 (×16): 1 [drp] via OPHTHALMIC
  Filled 2024-05-07: qty 5

## 2024-05-07 MED ORDER — SODIUM CHLORIDE 0.9 % IV SOLN
2.0000 g | INTRAVENOUS | Status: DC
  Administered 2024-05-07 – 2024-05-11 (×5): 2 g via INTRAVENOUS
  Filled 2024-05-07 (×5): qty 20

## 2024-05-07 MED ORDER — LACTATED RINGERS IV SOLN: INTRAVENOUS | Status: AC

## 2024-05-07 MED ORDER — PANTOPRAZOLE SODIUM 40 MG PO TBEC
40.0000 mg | DELAYED_RELEASE_TABLET | Freq: Every day | ORAL | Status: DC
  Administered 2024-05-07 – 2024-05-15 (×9): 40 mg via ORAL
  Filled 2024-05-07 (×10): qty 1

## 2024-05-07 MED ORDER — SPIRONOLACTONE 25 MG PO TABS
25.0000 mg | ORAL_TABLET | Freq: Two times a day (BID) | ORAL | Status: DC
  Administered 2024-05-07 – 2024-05-15 (×15): 25 mg via ORAL
  Filled 2024-05-07 (×13): qty 1

## 2024-05-07 MED ORDER — ADULT MULTIVITAMIN W/MINERALS CH
1.0000 | ORAL_TABLET | Freq: Every day | ORAL | Status: DC
  Administered 2024-05-07 – 2024-05-15 (×8): 1 via ORAL
  Filled 2024-05-07 (×10): qty 1

## 2024-05-07 MED ORDER — VANCOMYCIN HCL IN DEXTROSE 1-5 GM/200ML-% IV SOLN
1000.0000 mg | Freq: Once | INTRAVENOUS | Status: AC
  Administered 2024-05-07: 1000 mg via INTRAVENOUS
  Filled 2024-05-07: qty 200

## 2024-05-07 MED ORDER — CITALOPRAM HYDROBROMIDE 10 MG PO TABS
10.0000 mg | ORAL_TABLET | Freq: Every day | ORAL | Status: DC
  Administered 2024-05-07 – 2024-05-15 (×9): 10 mg via ORAL
  Filled 2024-05-07 (×10): qty 1

## 2024-05-07 MED ORDER — METRONIDAZOLE 500 MG/100ML IV SOLN
500.0000 mg | Freq: Two times a day (BID) | INTRAVENOUS | Status: DC
  Administered 2024-05-07 – 2024-05-10 (×7): 500 mg via INTRAVENOUS
  Filled 2024-05-07 (×9): qty 100

## 2024-05-07 MED ORDER — VITAMIN B-12 1000 MCG PO TABS
1000.0000 ug | ORAL_TABLET | Freq: Every day | ORAL | Status: DC
  Administered 2024-05-07 – 2024-05-15 (×8): 1000 ug via ORAL
  Filled 2024-05-07 (×10): qty 1

## 2024-05-07 MED ORDER — POTASSIUM CHLORIDE CRYS ER 20 MEQ PO TBCR
40.0000 meq | EXTENDED_RELEASE_TABLET | Freq: Two times a day (BID) | ORAL | Status: DC
  Administered 2024-05-07 – 2024-05-09 (×4): 40 meq via ORAL
  Filled 2024-05-07 (×4): qty 2

## 2024-05-07 MED ORDER — FENTANYL CITRATE (PF) 100 MCG/2ML IJ SOLN: 12.5000 ug | INTRAMUSCULAR | Status: DC | PRN

## 2024-05-07 MED ORDER — SODIUM CHLORIDE 0.9 % IV SOLN
2.0000 g | Freq: Once | INTRAVENOUS | Status: AC
  Administered 2024-05-07: 2 g via INTRAVENOUS
  Filled 2024-05-07: qty 20

## 2024-05-07 MED ORDER — BRIMONIDINE TARTRATE-TIMOLOL 0.2-0.5 % OP SOLN: 1.0000 [drp] | Freq: Two times a day (BID) | OPHTHALMIC | Status: DC

## 2024-05-07 MED ORDER — AMLODIPINE BESYLATE 5 MG PO TABS
10.0000 mg | ORAL_TABLET | Freq: Every day | ORAL | Status: DC
  Administered 2024-05-07 – 2024-05-15 (×7): 10 mg via ORAL
  Filled 2024-05-07 (×9): qty 2

## 2024-05-07 MED ORDER — DORZOLAMIDE HCL 2 % OP SOLN
1.0000 [drp] | Freq: Two times a day (BID) | OPHTHALMIC | Status: DC
  Administered 2024-05-07 – 2024-05-15 (×16): 1 [drp] via OPHTHALMIC
  Filled 2024-05-07: qty 10

## 2024-05-07 MED ORDER — BRIMONIDINE TARTRATE 0.2 % OP SOLN
1.0000 [drp] | Freq: Two times a day (BID) | OPHTHALMIC | Status: DC
  Administered 2024-05-07 – 2024-05-15 (×16): 1 [drp] via OPHTHALMIC
  Filled 2024-05-07: qty 5

## 2024-05-07 MED ORDER — GABAPENTIN 300 MG PO CAPS
300.0000 mg | ORAL_CAPSULE | Freq: Two times a day (BID) | ORAL | Status: DC
Start: 2024-05-07 — End: 2024-05-16
  Administered 2024-05-07 – 2024-05-15 (×17): 300 mg via ORAL
  Filled 2024-05-07 (×17): qty 1

## 2024-05-07 MED ORDER — TRAZODONE HCL 50 MG PO TABS
50.0000 mg | ORAL_TABLET | Freq: Every day | ORAL | Status: DC
  Administered 2024-05-07 – 2024-05-15 (×9): 50 mg via ORAL
  Filled 2024-05-07 (×9): qty 1

## 2024-05-07 MED ORDER — LABETALOL HCL 200 MG PO TABS
100.0000 mg | ORAL_TABLET | Freq: Two times a day (BID) | ORAL | Status: DC
Start: 2024-05-07 — End: 2024-05-16
  Administered 2024-05-07 – 2024-05-15 (×11): 100 mg via ORAL
  Filled 2024-05-07 (×15): qty 1

## 2024-05-07 MED ORDER — LORAZEPAM 0.5 MG PO TABS: 0.5000 mg | ORAL_TABLET | Freq: Two times a day (BID) | ORAL | Status: DC | PRN

## 2024-05-07 MED ORDER — LINEZOLID 600 MG/300ML IV SOLN
600.0000 mg | Freq: Two times a day (BID) | INTRAVENOUS | Status: DC
  Administered 2024-05-07 – 2024-05-10 (×6): 600 mg via INTRAVENOUS
  Filled 2024-05-07 (×10): qty 300

## 2024-05-07 MED ORDER — POLYETHYLENE GLYCOL 3350 17 G PO PACK: 17.0000 g | PACK | Freq: Every day | ORAL | Status: DC | PRN

## 2024-05-07 NOTE — Assessment & Plan Note (Addendum)
 Check anemia panel Continue B12

## 2024-05-07 NOTE — ED Provider Notes (Signed)
 [facility] Provider Note   CSN: [id number] Arrival date & time: 05/07/24  1100     Patient presents with: Altered Mental Status   Brenda Kerr is a 83 y.o. female.  {Add pertinent medical, surgical, social history, OB history to YEP:67052} Patient has a history of diabetes and hypertension.  She presents today with mild increased confusion.   Altered Mental Status      Prior to Admission medications   Medication Sig Start Date End Date Taking? Authorizing Provider  acetaminophen  (TYLENOL ) 500 MG tablet Take 500 mg by mouth every 6 (six) hours as needed for mild pain.    [provider]  amLODipine  (NORVASC ) 10 MG tablet Take 10 mg by mouth daily. 04/16/24   [provider]  BESIVANCE 0.6 % SUSP Apply 1 drop to eye 4 (four) times daily. 04/12/20   [provider]  bimatoprost  (LUMIGAN ) 0.01 % SOLN Place 1 drop into both eyes at bedtime.      [provider]  brimonidine -timolol  (COMBIGAN ) 0.2-0.5 % ophthalmic solution Place 1 drop into both eyes every 12 (twelve) hours.      [provider]  citalopram  (CELEXA ) 10 MG tablet Take 10 mg by mouth daily. 11/27/19   [provider]  diclofenac  sodium (VOLTAREN ) 1 % GEL Apply 2 g topically 4 (four) times daily as needed for pain. 05/16/19   [provider]  diclofenac  Sodium (VOLTAREN ) 1 % GEL APPLY TO AFFECTED JOINTS 4 TIMES DAILY 08/31/19   [provider]  dorzolamide  (TRUSOPT ) 2 % ophthalmic solution Place 1 drop into both eyes 2 (two) times daily.    [provider]  gabapentin  (NEURONTIN ) 300 MG capsule Take 300 mg by mouth 2 (two) times daily. 12/19/19   [provider]  labetalol  (NORMODYNE ) 100 MG tablet Take 100 mg by mouth 2 (two) times daily. 11/22/17   [provider]  LIDOCAINE  PLUS 4 % CREA Lidocaine  Plus 4 % topical cream 4               % 2 inch AS DIRECTED PAIN 10/02/19   [provider]  LORazepam  (ATIVAN ) 0.5 MG tablet Take 0.5 mg by mouth every 12 (twelve) hours as needed for anxiety, sleep, seizure or sedation. 01/08/24   [provider]  Multiple Vitamin (MULTIVITAMIN WITH MINERALS) TABS tablet Take 1 tablet by mouth daily. 07/10/19   Cheryle Debby LABOR, MD  mupirocin ointment (BACTROBAN) 2 % Apply 1 Application topically 2 (two) times daily. 04/29/24   [provider]  naproxen  sodium (ALEVE ) 220 MG tablet Take 220 mg by mouth 2 (two) times daily as needed (pain).    [provider]  ondansetron  (ZOFRAN  ODT) 4 MG disintegrating tablet Take 1 tablet (4 mg total) by mouth every 8 (eight) hours as needed. 05/23/19   Dean Clarity, MD  ondansetron  (ZOFRAN ) 4 MG tablet Take 4 mg by mouth every 8 (eight) hours. 10/02/19   [provider]  pantoprazole  (PROTONIX ) 40 MG tablet Take 40 mg by mouth daily. 11/06/19   [provider]  SENNA LAXATIVE 8.6 MG tablet Senna Concentrate 8.6 mg tablet 8.6             mg 2 tablet DAILY PRN CONSTIPATION 10/02/19   [provider]  spironolactone  (ALDACTONE ) 25 MG tablet Take 25 mg by mouth 2 (two) times daily. 11/22/17   [provider]  spironolactone  (ALDACTONE ) 25 MG tablet Take 1 tablet (  25 mg total) by mouth 2 (two) times daily. 07/10/19   Cheryle Debby LABOR, MD  traZODone  (DESYREL ) 50 MG tablet Take 50 mg by mouth at bedtime. 01/19/20   [provider]  vitamin B-12 1000 MCG tablet Take 1 tablet (1,000 mcg total) by mouth daily. 07/11/19   Cheryle Debby LABOR, MD    Allergies: Codeine    Review of Systems  Updated Vital Signs BP 127/82   Pulse 70   Temp 97.8 F (36.6 C) (Rectal)   Resp (!) 21   Wt 47.6 kg   SpO2 100%   BMI 19.20 kg/m   Physical Exam  (all labs ordered are listed, but only abnormal results are displayed) Labs Reviewed  COMPREHENSIVE METABOLIC PANEL WITH GFR - Abnormal; Notable for the following components:      Result Value    Potassium 3.3 (*)    Calcium 8.5 (*)    Albumin  2.4 (*)    AST 13 (*)    All other components within normal limits  CBC WITH DIFFERENTIAL/PLATELET - Abnormal; Notable for the following components:   WBC 11.6 (*)    RBC 3.49 (*)    Hemoglobin 9.9 (*)    HCT 30.9 (*)    Neutro Abs 9.9 (*)    All other components within normal limits  URINALYSIS, W/ REFLEX TO CULTURE (INFECTION SUSPECTED) - Abnormal; Notable for the following components:   APPearance HAZY (*)    Leukocytes,Ua MODERATE (*)    Bacteria, UA RARE (*)    All other components within normal limits  RESP PANEL BY RT-PCR (RSV, FLU A&B, COVID)  RVPGX2  CULTURE, BLOOD (ROUTINE X 2)  CULTURE, BLOOD (ROUTINE X 2)  URINE CULTURE  LACTIC ACID, PLASMA  PROTIME-INR  LACTIC ACID, PLASMA    EKG: None  Radiology: CT Head Wo Contrast Result Date: 05/07/2024 CLINICAL DATA:  Altered mental status. EXAM: CT HEAD WITHOUT CONTRAST TECHNIQUE: Contiguous axial images were obtained from the base of the skull through the vertex without intravenous contrast. RADIATION DOSE REDUCTION: This exam was performed according to the departmental dose-optimization program which includes automated exposure control, adjustment of the mA and/or kV according to patient size and/or use of iterative reconstruction technique. COMPARISON:  November 25, 2017 FINDINGS: Brain: There is generalized cerebral atrophy with widening of the extra-axial spaces and stable ventricular dilatation. There are areas of decreased attenuation within the white matter tracts of the supratentorial brain, consistent with microvascular disease changes. Vascular: No hyperdense vessel or unexpected calcification. Skull: Normal. Negative for fracture or focal lesion. 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 Other: None. IMPRESSION: 1. Generalized cerebral atrophy with stable ventricular dilatation. 2. No acute intracranial abnormality. 3. Left-sided phthisis bulbi. Electronically Signed    By: Suzen Dials M.D.   On: 05/07/2024 15:29   DG Foot Complete Right Result Date: 05/07/2024 CLINICAL DATA:  Foot pain and soft tissue wounds. EXAM: RIGHT FOOT COMPLETE - 3+ VIEW COMPARISON:  None Available. FINDINGS: Generalized osteopenia. No evidence of acute osteolysis or periostitis. No evidence of fracture or dislocation. Mild degenerative spurring involving the 1st MTP joint. Plantar and dorsal calcaneal bone spurs noted. Peripheral vascular calcification also seen. IMPRESSION: No radiographic evidence of osteomyelitis or other acute findings. 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 Calcaneal bone spurs. Osteopenia. Electronically Signed   By: Norleen LABOR Kil M.D.   On: 05/07/2024 12:53   DG Foot Complete Left Result Date: 05/07/2024 CLINICAL DATA:  Foot pain and soft tissue wounds. EXAM: LEFT FOOT -  COMPLETE 3 VIEW COMPARISON:  None Available. FINDINGS: Diffuse osteopenia noted. No evidence acute osteolysis or periostitis. No evidence of fracture or dislocation. Mild degenerative spurring is seen involving the 1st MTP joint. Dorsal and calcaneal bone spurs also noted. IMPRESSION: No radiographic evidence of osteomyelitis or other acute findings. 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  Calcaneal bone spurs. Osteopenia. Electronically Signed   By: Norleen DELENA Kil M.D.   On: 05/07/2024 12:52   DG Chest Port 1 View Result Date: 05/07/2024 CLINICAL DATA:  Two day history of altered mental status EXAM: PORTABLE CHEST 1 VIEW COMPARISON:  Chest radiograph dated 06/30/2019 FINDINGS: Low lung volumes with bronchovascular crowding. Dense left retrocardiac opacity. No pleural effusion or pneumothorax. The heart size and mediastinal contours are within normal limits. Severe degenerative changes of the right shoulder. IMPRESSION: Low lung volumes with bronchovascular crowding. Dense left retrocardiac opacity may represent atelectasis or pneumonia. Electronically Signed   By: Limin  Xu M.D.   On: 05/07/2024 12:50     {Document cardiac monitor, telemetry assessment procedure when appropriate:32947} Procedures   Medications Ordered in the ED  cefTRIAXone  (ROCEPHIN ) 2 g in sodium chloride  0.9 % 100 mL IVPB (0 g Intravenous Stopped 05/07/24 1222)  vancomycin  (VANCOCIN ) IVPB 1000 mg/200 mL premix (0 mg Intravenous Stopped 05/07/24 1333)  sodium chloride  0.9 % bolus 2,000 mL (2,000 mLs Intravenous New Bag/Given 05/07/24 1201)   I spoke with orthopedic surgeon Dr. Ernie and he stated he would contact Dr. Harden to see about amputation   {Click here for ABCD2, HEART and other calculators REFRESH Note before signing:1}                              Medical Decision Making Amount and/or Complexity of Data Reviewed Labs: ordered. Radiology: ordered.  Risk Prescription drug management. Decision regarding hospitalization.   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  Hospital admission with orthopedic consult  {Document critical care time when appropriate  Document review of labs and clinical decision tools ie CHADS2VASC2, etc  Document your independent review of radiology images and any outside records  Document your discussion with family members, caretakers and with consultants  Document social determinants of health affecting pt's care  Document your decision making why or why not admission, treatments were needed:32947:::1}   Final diagnoses:  Gangrene of lower extremity [facility])    ED Discharge Orders     None

## 2024-05-07 NOTE — Assessment & Plan Note (Addendum)
 See photo Wound care consult

## 2024-05-07 NOTE — Assessment & Plan Note (Signed)
Replete and trend 

## 2024-05-07 NOTE — Assessment & Plan Note (Signed)
 Continue amlodipine , spironolactone , and labetalol 

## 2024-05-07 NOTE — Sepsis Progress Note (Signed)
 Elink following code sepsis

## 2024-05-07 NOTE — H&P (Signed)
 History and Physical    Patient: Brenda Kerr FMW:[id number] DOB: 23-Feb-1941 DOA: 05/07/2024 DOS: the patient was seen and examined on 05/07/2024 PCP: Benjamine Aland, MD  Patient coming from: Home patient resides there with her husband.  She has a in-home caregiver as well as family that comes about a check on her.  Chief Complaint:  Chief Complaint  Patient presents with   Altered Mental Status   HPI: Brenda Kerr is a 83 y.o. female with medical history significant of HTN, venous insufficiency admitted with gangrenous right and left feet.  Patient has been bedbound for the proximately 2 years and continues to lose weight and have generalized failure to thrive.  Family brings her in today with a discovered a malodorous wound in her feet.  In the ED patient was noted to have a normal lactate, INR, white count.  She was noted to have hypokalemia, normal creatinine, hemoglobin of 9.9.  Urinalysis showed moderate leuks and positive blood.  Respiratory panel was negative and chest x-ray showed possible pneumonia.  The EDP consulted with orthopedics who recommended transfer to [facility] and to have consultation with Dr. Harden in the AM.  Review of Systems: As mentioned in the history of present illness. All other systems reviewed and are negative. Past Medical History:  Diagnosis Date   Arthritis    Diabetes mellitus without complication (HCC)    Dysrhythmia    GERD (gastroesophageal reflux disease)    Glaucoma    H/O hiatal hernia    Hypertension    Peripheral vascular disease (HCC)    Shortness of breath    walk a long way   Past Surgical History:  Procedure Laterality Date   ARTHROPLASTY  03/09/2012   lt hip   ESOPHAGOGASTRODUODENOSCOPY Left 01/31/2016   Procedure: ESOPHAGOGASTRODUODENOSCOPY (EGD);  Surgeon: Elsie Cree, MD;  Location: [facility];  Service: Endoscopy;  Laterality: Left;   EYE SURGERY     JOINT REPLACEMENT     hip replacement   LEFT HEART CATHETERIZATION WITH  CORONARY ANGIOGRAM N/A 05/29/2014   Procedure: LEFT HEART CATHETERIZATION WITH CORONARY ANGIOGRAM;  Surgeon: Erick JONELLE Bergamo, MD;  Location: [facility] CATH LAB;  Service: Cardiovascular;  Laterality: N/A;   TOTAL HIP ARTHROPLASTY  03/09/2012   Procedure: TOTAL HIP ARTHROPLASTY;  Surgeon: Rome JULIANNA Pepper, MD;  Location: [facility] OR;  Service: Orthopedics;  Laterality: Left;   Social History:  reports that she has never smoked. She has never used smokeless tobacco. She reports that she does not drink alcohol and does not use drugs.  Allergies  Allergen Reactions   Codeine Nausea And Vomiting    History reviewed. No pertinent family history.  Prior to Admission medications   Medication Sig Start Date End Date Taking? Authorizing Provider  acetaminophen  (TYLENOL ) 500 MG tablet Take 500 mg by mouth every 6 (six) hours as needed for mild pain.    [provider]  amLODipine  (NORVASC ) 10 MG tablet Take 10 mg by mouth daily. 04/16/24   [provider]  BESIVANCE 0.6 % SUSP Apply 1 drop to eye 4 (four) times daily. 04/12/20   [provider]  bimatoprost  (LUMIGAN ) 0.01 % SOLN Place 1 drop into both eyes at bedtime.      [provider]  brimonidine -timolol  (COMBIGAN ) 0.2-0.5 % ophthalmic solution Place 1 drop into both eyes every 12 (twelve) hours.      [provider]  citalopram  (CELEXA ) 10 MG tablet Take 10 mg by mouth daily. 11/27/19   [provider]  diclofenac  sodium (VOLTAREN ) 1 % GEL Apply 2 g topically 4 (four) times daily as needed for pain. 05/16/19   [provider]  diclofenac  Sodium (VOLTAREN ) 1 % GEL APPLY TO AFFECTED JOINTS 4 TIMES DAILY 08/31/19   [provider]  dorzolamide  (TRUSOPT ) 2 % ophthalmic solution Place 1 drop into both eyes 2 (two) times daily.    [provider]  gabapentin  (NEURONTIN ) 300 MG capsule Take 300 mg by mouth 2 (two) times daily. 12/19/19   [provider]  labetalol  (NORMODYNE ) 100 MG  tablet Take 100 mg by mouth 2 (two) times daily. 11/22/17   [provider]  LIDOCAINE  PLUS 4 % CREA Lidocaine  Plus 4 % topical cream 4               % 2 inch AS DIRECTED PAIN 10/02/19   [provider]  LORazepam  (ATIVAN ) 0.5 MG tablet Take 0.5 mg by mouth every 12 (twelve) hours as needed for anxiety, sleep, seizure or sedation. 01/08/24   [provider]  Multiple Vitamin (MULTIVITAMIN WITH MINERALS) TABS tablet Take 1 tablet by mouth daily. 07/10/19   Cheryle Debby LABOR, MD  mupirocin ointment (BACTROBAN) 2 % Apply 1 Application topically 2 (two) times daily. 04/29/24   [provider]  naproxen  sodium (ALEVE ) 220 MG tablet Take 220 mg by mouth 2 (two) times daily as needed (pain).    [provider]  ondansetron  (ZOFRAN  ODT) 4 MG disintegrating tablet Take 1 tablet (4 mg total) by mouth every 8 (eight) hours as needed. 05/23/19   Dean Clarity, MD  ondansetron  (ZOFRAN ) 4 MG tablet Take 4 mg by mouth every 8 (eight) hours. 10/02/19   [provider]  pantoprazole  (PROTONIX ) 40 MG tablet Take 40 mg by mouth daily. 11/06/19   [provider]  SENNA LAXATIVE 8.6 MG tablet Senna Concentrate 8.6 mg tablet 8.6             mg 2 tablet DAILY PRN CONSTIPATION 10/02/19   [provider]  spironolactone  (ALDACTONE ) 25 MG tablet Take 25 mg by mouth 2 (two) times daily. 11/22/17   [provider]  spironolactone  (ALDACTONE ) 25 MG tablet Take 1 tablet (25 mg total) by mouth 2 (two) times daily. 07/10/19   Cheryle Debby LABOR, MD  traZODone  (DESYREL ) 50 MG tablet Take 50 mg by mouth at bedtime. 01/19/20   [provider]  vitamin B-12 1000 MCG tablet Take 1 tablet (1,000 mcg total) by mouth daily. 07/11/19   Cheryle Debby LABOR, MD    Physical Exam: Vitals:   05/07/24 1200 05/07/24 1215 05/07/24 1230 05/07/24 1245  BP: 116/71 (!) 94/51 127/82   Pulse: (!) 212 83 84 70  Resp: 16 (!) 23 17 (!) 21  Temp:      TempSrc:      SpO2:  95% 100% 100% 100%  Weight: 47.6 kg      Physical Examination: General appearance - oriented to person, place, and time and cachectic Chest - clear to auscultation, no wheezes, rales or rhonchi, symmetric air entry Heart - normal rate, regular rhythm, normal S1, S2, no murmurs, rubs, clicks or gallops Abdomen - soft, nontender, nondistended, no masses or organomegaly Extremities - venous stasis dermatitis noted, 1+ dorsalis pedis bilaterally, see photos gangrene is noted on both feet.   Document Information  Photos  Sacrum  05/07/2024 18:15  Attached To:     Document Information  Photos  Sacrum  05/07/2024 18:11  Photos  Left heel  05/07/2024 18:13       Photos  Right heel  05/07/2024 18:12      Document Information  Photos  Left Foot  05/07/2024 15:02     Document Information  Photos  Left Foot  05/07/2024 15:02    Data Reviewed: Results for orders placed or performed during the hospital encounter of 05/07/24 (from the past 24 hours)  Resp panel by RT-PCR (RSV, Flu A&B, Covid) Anterior Nasal Swab     Status: None   Collection Time: 05/07/24 11:21 AM   Specimen: Anterior Nasal Swab  Result Value Ref Range   SARS Coronavirus 2 by RT PCR NEGATIVE NEGATIVE   Influenza A by PCR NEGATIVE NEGATIVE   Influenza B by PCR NEGATIVE NEGATIVE   Resp Syncytial Virus by PCR NEGATIVE NEGATIVE  Urinalysis, w/ Reflex to Culture (Infection Suspected) -Urine, Clean Catch     Status: Abnormal   Collection Time: 05/07/24 11:21 AM  Result Value Ref Range   Specimen Source URINE, CLEAN CATCH    Color, Urine YELLOW YELLOW   APPearance HAZY (A) CLEAR   Specific Gravity, Urine 1.014 1.005 - 1.030   pH 5.0 5.0 - 8.0   Glucose, UA NEGATIVE NEGATIVE mg/dL   Hgb urine dipstick NEGATIVE NEGATIVE   Bilirubin Urine NEGATIVE NEGATIVE   Ketones, ur NEGATIVE NEGATIVE mg/dL   Protein, ur NEGATIVE NEGATIVE mg/dL   Nitrite NEGATIVE NEGATIVE   Leukocytes,Ua MODERATE (A)  NEGATIVE   RBC / HPF 0-5 0 - 5 RBC/hpf   WBC, UA 21-50 0 - 5 WBC/hpf   Bacteria, UA RARE (A) NONE SEEN   Squamous Epithelial / HPF 0-5 0 - 5 /HPF   Mucus PRESENT   Comprehensive metabolic panel     Status: Abnormal   Collection Time: 05/07/24 11:52 AM  Result Value Ref Range   Sodium 137 135 - 145 mmol/L   Potassium 3.3 (L) 3.5 - 5.1 mmol/L   Chloride 102 98 - 111 mmol/L   CO2 27 22 - 32 mmol/L   Glucose, Bld 93 70 - 99 mg/dL   BUN 9 8 - 23 mg/dL   Creatinine, Ser 9.47 0.44 - 1.00 mg/dL   Calcium 8.5 (L) 8.9 - 10.3 mg/dL   Total Protein 6.5 6.5 - 8.1 g/dL   Albumin  2.4 (L) 3.5 - 5.0 g/dL   AST 13 (L) 15 - 41 U/L   ALT 10 0 - 44 U/L   Alkaline Phosphatase 72 38 - 126 U/L   Total Bilirubin 0.7 0.0 - 1.2 mg/dL   GFR, Estimated >39 >39 mL/min   Anion gap 8 5 - 15  CBC with Differential     Status: Abnormal   Collection Time: 05/07/24 11:52 AM  Result Value Ref Range   WBC 11.6 (H) 4.0 - 10.5 K/uL   RBC 3.49 (L) 3.87 - 5.11 MIL/uL   Hemoglobin 9.9 (L) 12.0 - 15.0 g/dL   HCT 69.0 (L) 63.9 - 53.9 %   MCV 88.5 80.0 - 100.0 fL   MCH 28.4 26.0 - 34.0 pg   MCHC 32.0 30.0 - 36.0 g/dL   RDW 86.7 88.4 - 84.4 %   Platelets 315 150 - 400 K/uL   nRBC 0.0 0.0 - 0.2 %   Neutrophils Relative % 86 %   Neutro Abs 9.9 (H) 1.7 - 7.7 K/uL   Lymphocytes Relative 7 %   Lymphs Abs 0.9 0.7 - 4.0 K/uL   Monocytes Relative 6 %  Monocytes Absolute 0.7 0.1 - 1.0 K/uL   Eosinophils Relative 1 %   Eosinophils Absolute 0.1 0.0 - 0.5 K/uL   Basophils Relative 0 %   Basophils Absolute 0.1 0.0 - 0.1 K/uL   Immature Granulocytes 0 %   Abs Immature Granulocytes 0.04 0.00 - 0.07 K/uL  Protime-INR     Status: None   Collection Time: 05/07/24 11:52 AM  Result Value Ref Range   Prothrombin Time 14.3 11.4 - 15.2 seconds   INR 1.1 0.8 - 1.2  Blood Culture (routine x 2)     Status: None (Preliminary result)   Collection Time: 05/07/24 11:52 AM   Specimen: BLOOD  Result Value Ref Range   Specimen  Description BLOOD BLOOD LEFT ARM    Special Requests      BOTTLES DRAWN AEROBIC AND ANAEROBIC Blood Culture adequate volume Performed at [facility], [address], [place], [place] [address]    Culture PENDING    Report Status PENDING   Lactic acid, plasma     Status: None   Collection Time: 05/07/24 12:00 PM  Result Value Ref Range   Lactic Acid, Venous 0.7 0.5 - 1.9 mmol/L  Blood Culture (routine x 2)     Status: None (Preliminary result)   Collection Time: 05/07/24 12:00 PM   Specimen: BLOOD  Result Value Ref Range   Specimen Description BLOOD BLOOD RIGHT ARM    Special Requests      BOTTLES DRAWN AEROBIC AND ANAEROBIC Blood Culture adequate volume Performed at [facility], [address] [place], [place] [address]    Culture PENDING    Report Status PENDING   Lactic acid, plasma     Status: None   Collection Time: 05/07/24  2:03 PM  Result Value Ref Range   Lactic Acid, Venous 0.9 0.5 - 1.9 mmol/L   CT Head Wo Contrast Result Date: 05/07/2024 CLINICAL DATA:  Altered mental status. EXAM: CT HEAD WITHOUT CONTRAST TECHNIQUE: Contiguous axial images were obtained from the base of the skull through the vertex without intravenous contrast. RADIATION DOSE REDUCTION: This exam was performed according to the departmental dose-optimization program which includes automated exposure control, adjustment of the mA and/or kV according to patient size and/or use of iterative reconstruction technique. COMPARISON:  November 25, 2017 FINDINGS: Brain: There is generalized cerebral atrophy with widening of the extra-axial spaces and stable ventricular dilatation. There are areas of decreased attenuation within the white matter tracts of the supratentorial brain, consistent with microvascular disease changes. Vascular: No hyperdense vessel or unexpected calcification. Skull: Normal. Negative for fracture or focal lesion. 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 Other: None. IMPRESSION: 1.  Generalized cerebral atrophy with stable ventricular dilatation. 2. No acute intracranial abnormality. 3. Left-sided phthisis bulbi. Electronically Signed   By: Suzen Dials M.D.   On: 05/07/2024 15:29   DG Foot Complete Right Result Date: 05/07/2024 CLINICAL DATA:  Foot pain and soft tissue wounds. EXAM: RIGHT FOOT COMPLETE - 3+ VIEW COMPARISON:  None Available. FINDINGS: Generalized osteopenia. No evidence of acute osteolysis or periostitis. No evidence of fracture or dislocation. Mild degenerative spurring involving the 1st MTP joint. Plantar and dorsal calcaneal bone spurs noted. Peripheral vascular calcification also seen. IMPRESSION: No radiographic evidence of osteomyelitis or other acute findings. 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 Calcaneal bone spurs. Osteopenia. Electronically Signed   By: Norleen DELENA Kil M.D.   On: 05/07/2024 12:53   DG Foot Complete Left Result Date: 05/07/2024 CLINICAL DATA:  Foot pain and  soft tissue wounds. EXAM: LEFT FOOT - COMPLETE 3 VIEW COMPARISON:  None Available. FINDINGS: Diffuse osteopenia noted. No evidence acute osteolysis or periostitis. No evidence of fracture or dislocation. Mild degenerative spurring is seen involving the 1st MTP joint. Dorsal and calcaneal bone spurs also noted. IMPRESSION: No radiographic evidence of osteomyelitis or other acute findings. 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  Calcaneal bone spurs. Osteopenia. Electronically Signed   By: Norleen DELENA Kil M.D.   On: 05/07/2024 12:52   DG Chest Port 1 View Result Date: 05/07/2024 CLINICAL DATA:  Two day history of altered mental status EXAM: PORTABLE CHEST 1 VIEW COMPARISON:  Chest radiograph dated 06/30/2019 FINDINGS: Low lung volumes with bronchovascular crowding. Dense left retrocardiac opacity. No pleural effusion or pneumothorax. The heart size and mediastinal contours are within normal limits. Severe degenerative changes of the right shoulder. IMPRESSION: Low lung volumes with bronchovascular  crowding. Dense left retrocardiac opacity may represent atelectasis or pneumonia. Electronically Signed   By: Limin  Xu M.D.   On: 05/07/2024 12:50   EKG: Sinus rhythm with PVCs.  Possible old anterior infarct.  Assessment and Plan: * Gangrene of lower extremity (HCC) With significant odor. Transferred to [facility] for orthopedist evaluation  Sacral decubitus ulcer See photo Wound care consult  Hypokalemia Replete and trend  Venous stasis ulcers of both lower extremities (HCC) Wound care consult  Essential hypertension Continue amlodipine , spironolactone , and labetalol   Anemia Check anemia panel Continue B12  Diastolic CHF (HCC) Continue labetalol , spironolactone       Advance Care Planning:   Code Status: Prior DNR/DNI, confirmed with family  Consults: Orthopedics  Family Communication: 2 daughters by phone.  They confirmed her CODE STATUS.  They are well aware of her markedly debilitated state but are okay with amputation if needed.  Consider palliative care consult.  Severity of Illness: The appropriate patient status for this patient is INPATIENT. Inpatient status is judged to be reasonable and necessary in order to provide the required intensity of service to ensure the patient's safety. The patient's presenting symptoms, physical exam findings, and initial radiographic and laboratory data in the context of their chronic comorbidities is felt to place them at high risk for further clinical deterioration. Furthermore, it is not anticipated that the patient will be medically stable for discharge from the hospital within 2 midnights of admission.   * I certify that at the point of admission it is my clinical judgment that the patient will require inpatient hospital care spanning beyond 2 midnights from the point of admission due to high intensity of service, high risk for further deterioration and high frequency of surveillance required.*  Author: Glenys GORMAN Birk,  MD 05/07/2024 3:51 PM  For on call review [email or web address].

## 2024-05-07 NOTE — Hospital Course (Signed)
 Patient is an 83 year old female with history of HTN, venous insufficiency admitted with gangrenous right and left feet.  Patient has been bedbound for the proximately 2 years and continues to lose weight and have generalized failure to thrive.  Family brings her in today with a discovered a malodorous wound in her feet.  In the ED patient was noted to have a normal lactate, INR, white count.  She was noted to have hypokalemia, normal creatinine, hemoglobin of 9.9.  Urinalysis showed moderate leuks and positive blood.  Respiratory panel was negative and chest x-ray showed possible pneumonia.  The EDP consulted with orthopedics who recommended transfer to [facility] and to have consultation with Dr. Harden in the AM.

## 2024-05-07 NOTE — Assessment & Plan Note (Signed)
 With significant odor. Transferred to [facility] for orthopedist evaluation

## 2024-05-07 NOTE — Assessment & Plan Note (Signed)
 Continue labetalol , spironolactone 

## 2024-05-07 NOTE — ED Triage Notes (Signed)
 Pt BIB [facility] for AMS for past 2 days. Pts toes on both feet are necrotic, family was not aware. Pt is prone to bed sores, wound care nurse has her in treatment currently.  BP 142/66 P 72 O2 98% RA CBG 107 T 99.9 axillary

## 2024-05-07 NOTE — Assessment & Plan Note (Signed)
 -  Wound care consult

## 2024-05-08 DIAGNOSIS — E43 Unspecified severe protein-calorie malnutrition: Secondary | ICD-10-CM | POA: Diagnosis present

## 2024-05-08 DIAGNOSIS — I96 Gangrene, not elsewhere classified: Secondary | ICD-10-CM | POA: Diagnosis not present

## 2024-05-08 MED ORDER — ZINC OXIDE 40 % EX OINT
1.0000 | TOPICAL_OINTMENT | Freq: Two times a day (BID) | CUTANEOUS | Status: DC
  Administered 2024-05-08 – 2024-05-15 (×15): 1 via TOPICAL
  Filled 2024-05-08: qty 57

## 2024-05-08 MED ORDER — BOOST / RESOURCE BREEZE PO LIQD CUSTOM
1.0000 | Freq: Three times a day (TID) | ORAL | Status: DC
  Administered 2024-05-08 – 2024-05-09 (×6): 1 via ORAL

## 2024-05-08 NOTE — Consult Note (Addendum)
 WOC Nurse Consult Note: patient with history of PVD  Reason for Consult: multiple wounds  Wound type: 1.  Stage 3 Pressure Injury sacrum red moist  2.  Unstageable Pressure Injuries B heels with dry eschar  3.  Full thickness L and R toes necrotic (Right worse than L)  Pressure Injury POA: Yes Measurement: see nursing flowsheet  Wound bed: toes black, others as above  Drainage (amount, consistency, odor) see nursing flowsheet  Periwound: dark discoloration surrounding sacral wound, likely started as deep tissue pressure injury  Dressing procedure/placement/frequency:  Cleanse sacral wound with Vashe wound cleanser Soila [phone]) do not rinse and allow to air dry.  Apply silver hydrofiber ([place] [phone] Aquacel [employer]) to wound bed daily and secure with silicone foam. Cleanse B heels with Vashe, apply Xeroform gauze Soila [phone]) to wound beds daily and secure with dry gauze and Kerlix roll gauze.  Cleanse black toes both feet with Vashe, apply Xeroform gauze ([place]son [phone]) to wound beds daily and secure with Kerlix roll guaze.  B feet should be placed in prevalon boots to offload pressure Soila [phone]).  Patient should be placed on a low air loss mattress for pressure redistribution and moisture management.   Patient is pending consult by Dr. Harden for feet.  Any wound care orders placed by Dr. Harden supercede wound care orders placed by [facility] team.  POC discussed with bedside nurse. WOC team will not follow. Re-consult if further needs arise.   Thank you,    Powell Bar MSN, RN-BC, [employer]

## 2024-05-08 NOTE — Progress Notes (Signed)
  Progress Note   Patient: Brenda Kerr FMW:[id number] DOB: 04-Oct-1940 DOA: 05/07/2024     1 DOS: the patient was seen and examined on 05/08/2024   Brief hospital course: Patient is an 83 year old female with history of HTN, venous insufficiency admitted with gangrenous right and left feet.  Patient has been bedbound for the proximately 2 years and continues to lose weight and have generalized failure to thrive.  Family brings her in today with a discovered a malodorous wound in her feet.  In the ED patient was noted to have a normal lactate, INR, white count.  She was noted to have hypokalemia, normal creatinine, hemoglobin of 9.9.  Urinalysis showed moderate leuks and positive blood.  Respiratory panel was negative and chest x-ray showed possible pneumonia.  The EDP consulted with orthopedics who recommended transfer to [facility] and to have consultation with Dr. Harden in the AM.  Orthopedic surgery has spoken to Dr. Harden. He will see the patient on Tuesday morning.  Assessment and Plan: * Gangrene of lower extremity (HCC) With significant odor. Transferred to [facility] for orthopedist evaluation  Sacral decubitus ulcer See photo Wound care consult  Hypokalemia Replete and trend  Venous stasis ulcers of both lower extremities (HCC) Wound care consult  Essential hypertension Continue amlodipine , spironolactone , and labetalol   Anemia Check anemia panel Continue B12  Diastolic CHF (HCC) Continue labetalol , spironolactone    Severe Protein Calorie Malnutrition Calorie count and nutrition consult ordered. Daughter states that the patient has been losing weight despite the fact that she is reportedly eating well. Concern for underlying malignancy vs pt in fact not consuming enough nutrition as outpatient.     Subjective: The patient is resting comfortably.   Physical Exam: Vitals:   05/08/24 0000 05/08/24 0500 05/08/24 0758 05/08/24 1210  BP: (!) 91/47 107/65 (!)  106/57 102/63  Pulse: 63 62 71 (!) 56  Resp: 16 16    Temp: 97.8 F (36.6 C) 98.1 F (36.7 C) 97.9 F (36.6 C)   TempSrc: Oral Oral    SpO2: 100% 100% 100% 100%  Weight:       Exam:  Constitutional:  The patient is sleeping. Minimally responsive to stimulation. No acute distress. Respiratory:  No increased work of breathing. No wheezes, rales, or rhonchi No tactile fremitus Cardiovascular:  Regular rate and rhythm No murmurs, ectopy, or gallups. No lateral PMI. No thrills. Abdomen:  Abdomen is soft, non-tender, non-distended No hernias, masses, or organomegaly Normoactive bowel sounds.  Musculoskeletal:  No cyanosis, clubbing, or edema Necrosis of digits on the left foot.  Skin:  No rashes, lesions, ulcers palpation of skin: no induration or nodules Necrosis of digits on left foot Sacral decubitus ulcer  Neurologic:  CN 2-12 intact Sensation all 4 extremities intact Psychiatric:  Mental status Mood, affect appropriate Orientation to person, place, time  judgment and insight appear intact  Data Reviewed:  CBC BMP  Family Communication: Daughter at bedside  Disposition: Status is: Inpatient Remains inpatient appropriate because: The patient requires surgical intervention. She also requires evaluation for severe protein calorie malnutrtion.   Planned Discharge Destination: TBD    Time spent: 34 minutes  Author: Halcyon Heck, DO 05/08/2024 4:12 PM  For on call review [email or web address].

## 2024-05-08 NOTE — Plan of Care (Signed)

## 2024-05-08 NOTE — Plan of Care (Signed)

## 2024-05-08 NOTE — Progress Notes (Signed)
 Patient ID: Brenda Kerr, female   DOB: June 15, 1941, 83 y.o.   MRN: [id number]  Dr Harden has been consulted. He will see her Tuesday morning to assess and determine management plan with primary team

## 2024-05-08 NOTE — Assessment & Plan Note (Addendum)
 The patient appears emaciated. She has severe temporal wasting. Her body is skeletal. Extremities are cachectic. A daughter is at bedside and she reports that the patient eats well at home, but that she is losing weight anyway. Will order calorie count and nutrition consult.

## 2024-05-09 DIAGNOSIS — I96 Gangrene, not elsewhere classified: Secondary | ICD-10-CM | POA: Diagnosis not present

## 2024-05-09 LAB — CBC WITH DIFFERENTIAL/PLATELET
Abs Immature Granulocytes: 0.04 K/uL (ref 0.00–0.07)
Basophils Absolute: 0 K/uL (ref 0.0–0.1)
Basophils Relative: 0 %
Eosinophils Absolute: 0.1 K/uL (ref 0.0–0.5)
Eosinophils Relative: 1 %
HCT: 30.3 % — ABNORMAL LOW (ref 36.0–46.0)
Hemoglobin: 9.9 g/dL — ABNORMAL LOW (ref 12.0–15.0)
Immature Granulocytes: 0 %
Lymphocytes Relative: 7 %
Lymphs Abs: 0.9 K/uL (ref 0.7–4.0)
MCH: 28.1 pg (ref 26.0–34.0)
MCHC: 32.7 g/dL (ref 30.0–36.0)
MCV: 86.1 fL (ref 80.0–100.0)
Monocytes Absolute: 1 K/uL (ref 0.1–1.0)
Monocytes Relative: 8 %
Neutro Abs: 10 K/uL — ABNORMAL HIGH (ref 1.7–7.7)
Neutrophils Relative %: 84 %
Platelets: 297 K/uL (ref 150–400)
RBC: 3.52 MIL/uL — ABNORMAL LOW (ref 3.87–5.11)
RDW: 13.1 % (ref 11.5–15.5)
WBC: 12.1 K/uL — ABNORMAL HIGH (ref 4.0–10.5)
nRBC: 0 % (ref 0.0–0.2)

## 2024-05-09 LAB — URINE CULTURE: Culture: >=100000 — AB

## 2024-05-09 LAB — BASIC METABOLIC PANEL WITH GFR
Anion gap: 11 (ref 5–15)
BUN: 7 mg/dL — ABNORMAL LOW (ref 8–23)
CO2: 18 mmol/L — ABNORMAL LOW (ref 22–32)
Calcium: 8.5 mg/dL — ABNORMAL LOW (ref 8.9–10.3)
Chloride: 106 mmol/L (ref 98–111)
Creatinine, Ser: 0.55 mg/dL (ref 0.44–1.00)
GFR, Estimated: >60 mL/min (ref >60)
Glucose, Bld: 109 mg/dL — ABNORMAL HIGH (ref 70–99)
Potassium: 5 mmol/L (ref 3.5–5.1)
Sodium: 135 mmol/L (ref 135–145)

## 2024-05-09 NOTE — Plan of Care (Signed)

## 2024-05-09 NOTE — Progress Notes (Signed)
 Progress Note   Patient: Brenda Kerr FMW:[id number] DOB: 12-10-1940 DOA: 05/07/2024     2 DOS: the patient was seen and examined on 05/09/2024   Brief hospital course: Patient is an 83 year old female with history of HTN, venous insufficiency admitted with gangrenous right and left feet.  Patient has been bedbound for the proximately 2 years and continues to lose weight and have generalized failure to thrive.  Family brings her in today with a discovered a malodorous wound in her feet.  In the ED patient was noted to have a normal lactate, INR, white count.  She was noted to have hypokalemia, normal creatinine, hemoglobin of 9.9.  Urinalysis showed moderate leuks and positive blood.  Respiratory panel was negative and chest x-ray showed possible pneumonia.  The EDP consulted with orthopedics who recommended transfer to [facility].  Orthopedic surgery has spoken to Dr. Harden. He will see the patient today. Assessment and Plan: * Gangrene of lower extremity (HCC) With significant odor. Transferred to [facility] for evaluation by orthopedic surgery. The patient is awaiting consultation with Dr. Harden.   I have discussed the patient in detail with the patient's daughter, [name]. The patient is a very poor candidate for even amputation. With her very poor functional and nutritional status, healing from any surgery will be difficult. The patient's vascular status is another potential difficult issue. I told [name] that we will see what Dr. Harden thinks and go from their. I have explained that it is quite possible that the patient will end up on comfort care.  Sacral decubitus ulcer See photo Wound care consult  Hypokalemia Replete and trend  Venous stasis ulcers of both lower extremities (HCC) Wound care consult  Essential hypertension Continue amlodipine , spironolactone , and labetalol   Anemia Check anemia panel Continue B12  Diastolic CHF (HCC) Continue labetalol ,  spironolactone   Severe Protein Calorie Malnutrition Calorie count and nutrition consult ordered. Daughter states that the patient has been losing weight despite the fact that she is reportedly eating well. Concern for underlying malignancy vs pt in fact not consuming enough nutrition as outpatient. The patient's family has expressed concern that the patient is not eating today. I discussed the patient's functional status and nutritional status with the patient's daughter, [name]. I explained that it is not just today. The patient has not been eating well for weeks/months. She admits that the patient has lost a lot of weight since Mother's Day. Calorie count and nutrition have been consulted, although the patient is moribund and does not seem likely to start eating in any case. Her prealbumin is 5. The patient is very likely to require palliative care and comfort care after she is evaluated by Dr. Harden.      Subjective: The patient is lying quietly in bed. She responds to me verbally saying that she will open her eyes, but she never does. She is skeletal and moribund.   Physical Exam: Vitals:   05/08/24 2311 05/09/24 0317 05/09/24 0715 05/09/24 1157  BP: (!) 108/50 (!) 118/46 (!) 105/55 130/72  Pulse:   78 62  Resp: 17 16 16 15   Temp: 98.1 F (36.7 C) 97.9 F (36.6 C) 99.4 F (37.4 C) 98.1 F (36.7 C)  TempSrc: Axillary Axillary Oral   SpO2: 100%   100%  Weight:       Exam:  Constitutional:  The patient is lying quietly in bed. Verbally responsive to voice. Does not follow commands. No acute distress. Respiratory:  No increased work of  breathing. No wheezes, rales, or rhonchi No tactile fremitus Cardiovascular:  Regular rate and rhythm No murmurs, ectopy, or gallups. No lateral PMI. No thrills. Abdomen:  Abdomen is soft, non-tender, non-distended No hernias, masses, or organomegaly Normoactive bowel sounds.  Musculoskeletal:  No cyanosis, clubbing, or edema Necrosis of  digits on the left foot. Both feet are bandaged today. Skin:  No rashes, lesions, ulcers palpation of skin: no induration or nodules Necrosis of digits on left foot Sacral decubitus ulcer  Neurologic:  CN 2-12 intact Sensation all 4 extremities intact Psychiatric:  Mental status Mood, affect appropriate Orientation to person, place, time  judgment and insight appear intact  Data Reviewed:  CBC BMP  Family Communication: Daughter at bedside  Disposition: Status is: Inpatient Remains inpatient appropriate because: The patient requires surgical intervention. She also requires evaluation for severe protein calorie malnutrtion.   Planned Discharge Destination: TBD    Time spent: 34 minutes  Author: Tayna Smethurst, DO 05/09/2024 3:53 PM  For on call review [email or web address].

## 2024-05-09 NOTE — Progress Notes (Signed)
Wound care, dressing change done

## 2024-05-09 NOTE — Consult Note (Addendum)
 ORTHOPAEDIC CONSULTATION  REQUESTING PHYSICIAN: Swayze, Ava, DO  Chief Complaint: Gangrene toes of both feet.  HPI: Brenda Kerr is a 83 y.o. female who presents with dry gangrenous changes of the forefoot bilaterally.  Past Medical History:  Diagnosis Date   Arthritis    Diabetes mellitus without complication (HCC)    Dysrhythmia    GERD (gastroesophageal reflux disease)    Glaucoma    H/O hiatal hernia    Hypertension    Peripheral vascular disease (HCC)    Shortness of breath    walk a long way   Past Surgical History:  Procedure Laterality Date   ARTHROPLASTY  03/09/2012   lt hip   ESOPHAGOGASTRODUODENOSCOPY Left 01/31/2016   Procedure: ESOPHAGOGASTRODUODENOSCOPY (EGD);  Surgeon: Elsie Cree, MD;  Location: [facility];  Service: Endoscopy;  Laterality: Left;   EYE SURGERY     JOINT REPLACEMENT     hip replacement   LEFT HEART CATHETERIZATION WITH CORONARY ANGIOGRAM N/A 05/29/2014   Procedure: LEFT HEART CATHETERIZATION WITH CORONARY ANGIOGRAM;  Surgeon: Erick JONELLE Bergamo, MD;  Location: [facility] CATH LAB;  Service: Cardiovascular;  Laterality: N/A;   TOTAL HIP ARTHROPLASTY  03/09/2012   Procedure: TOTAL HIP ARTHROPLASTY;  Surgeon: Rome JULIANNA Pepper, MD;  Location: [facility];  Service: Orthopedics;  Laterality: Left;   Social History   Socioeconomic History   Marital status: Married    Spouse name: james   Number of children: Not on file   Years of education: Not on file   Highest education level: Not on file  Occupational History    Comment: worked at [employer]   Tobacco Use   Smoking status: Never   Smokeless tobacco: Never  Vaping Use   Vaping status: Never Used  Substance and Sexual Activity   Alcohol use: No   Drug use: No   Sexual activity: Never    Birth control/protection: Post-menopausal  Other Topics Concern   Not on file  Social History Narrative   Not on file   Social Drivers of Health   Financial Resource Strain: Not on file  Food  Insecurity: No Food Insecurity (05/08/2024)   Hunger Vital Sign    Worried About Running Out of Food in the Last Year: Never true    Ran Out of Food in the Last Year: Never true  Transportation Needs: No Transportation Needs (05/08/2024)   PRAPARE - Administrator, Civil Service (Medical): No    Lack of Transportation (Non-Medical): No  Physical Activity: Not on file  Stress: Not on file  Social Connections: Moderately Isolated (05/08/2024)   Social Connection and Isolation Panel    Frequency of Communication with Friends and Family: More than three times a week    Frequency of Social Gatherings with Friends and Family: More than three times a week    Attends Religious Services: Never    Database administrator or Organizations: No    Attends Banker Meetings: Never    Marital Status: Married   History reviewed. No pertinent family history. - negative except otherwise stated in the family history section Allergies  Allergen Reactions   Codeine Nausea And Vomiting   Prior to Admission medications   Medication Sig Start Date End Date Taking? Authorizing Provider  amLODipine  (NORVASC ) 10 MG tablet Take 10 mg by mouth daily. 04/16/24  Yes [provider]  citalopram  (CELEXA ) 10 MG tablet Take 10 mg by mouth daily. 11/27/19  Yes [provider]  diclofenac  sodium (VOLTAREN ) 1 % GEL Apply 2 g topically 4 (four) times daily as needed for pain. 05/16/19  Yes [provider]  dorzolamide  (TRUSOPT ) 2 % ophthalmic solution Place 1 drop into the right eye daily.   Yes [provider]  labetalol  (NORMODYNE ) 100 MG tablet Take 100 mg by mouth 2 (two) times daily. 11/22/17  Yes [provider]  LORazepam  (ATIVAN ) 0.5 MG tablet Take 0.5 mg by mouth every 12 (twelve) hours as needed for anxiety, sleep, seizure or sedation. 01/08/24  Yes [provider]  mupirocin ointment (BACTROBAN) 2 % Apply 1 Application topically 2 (two) times  daily. 04/29/24  Yes [provider]   No results found. - pertinent xrays, CT, MRI studies were reviewed and independently interpreted  Positive ROS: All other systems have been reviewed and were otherwise negative with the exception of those mentioned in the HPI and as above.  Physical Exam: General: Not alert, no acute distress Psychiatric: Patient is not competent for consent with flat mood and affect Lymphatic: No axillary or cervical lymphadenopathy Cardiovascular: No pedal edema Respiratory: No cyanosis, no use of accessory musculature GI: No organomegaly, abdomen is soft and non-tender    Images:  @ENCIMAGES @  Labs:  Lab Results  Component Value Date   HGBA1C 5.3 05/07/2024   ESRSEDRATE 40 (H) 07/03/2019   ESRSEDRATE 99 (H) 01/27/2016   CRP 9.6 (H) 05/07/2024   CRP 0.9 07/03/2019   LABURIC 4.9 07/03/2019   REPTSTATUS PENDING 05/07/2024   CULT  05/07/2024    NO GROWTH 2 DAYS Performed at [facility], [address] [place], [place] [address]    IDOLINA ENTEROCOCCUS FAECALIS (A) 05/07/2024    Lab Results  Component Value Date   ALBUMIN  2.4 (L) 05/07/2024   ALBUMIN  3.4 (L) 03/09/2020   ALBUMIN  4.2 05/23/2019   PREALBUMIN 5 (L) 05/07/2024   LABURIC 4.9 07/03/2019        Latest Ref Rng & Units 05/09/2024    4:26 AM 05/07/2024   11:52 AM 03/09/2020   12:08 AM  CBC EXTENDED  WBC 4.0 - 10.5 K/uL 12.1  11.6  9.7   RBC 3.87 - 5.11 MIL/uL 3.52  3.49  4.46   Hemoglobin 12.0 - 15.0 g/dL 9.9  9.9  87.2   HCT 63.9 - 46.0 % 30.3  30.9  39.9   Platelets 150 - 400 K/uL 297  315  285   NEUT# 1.7 - 7.7 K/uL 10.0  9.9    Lymph# 0.7 - 4.0 K/uL 0.9  0.9      Neurologic: Patient does not have protective sensation bilateral lower extremities.   MUSCULOSKELETAL:   Skin: Examination patient has dry gangrenous changes of both feet.  She does not have a palpable dorsalis pedis or posterior tibial pulse.  I cannot palpate a popliteal pulse.  White cell count 12.1  with a hemoglobin of 9.9.  No ascending cellulitis.  Patient's lower extremities are thin frail and cachectic.  Patient has flexion contractures of her lower extremities.  Assessment: Assessment: Severe peripheral vascular disease with dry gangrenous changes to the forefoot bilaterally.  Plan: Recommended to patient's family to proceed with conservative treatment.  If patient develops sepsis from the gangrenous changes she would require an above-knee amputation.  Would continue Vashe dressing changes and continue with the Prevalon boots.  Thank you for the consult and the opportunity to see Ms. Anitra Jerona Sage, MD [facility] [phone] 5:10 PM

## 2024-05-10 DIAGNOSIS — I5030 Unspecified diastolic (congestive) heart failure: Secondary | ICD-10-CM

## 2024-05-10 DIAGNOSIS — Z66 Do not resuscitate: Secondary | ICD-10-CM | POA: Diagnosis not present

## 2024-05-10 DIAGNOSIS — Z789 Other specified health status: Secondary | ICD-10-CM

## 2024-05-10 DIAGNOSIS — Z7189 Other specified counseling: Secondary | ICD-10-CM | POA: Diagnosis not present

## 2024-05-10 DIAGNOSIS — I96 Gangrene, not elsewhere classified: Secondary | ICD-10-CM | POA: Diagnosis not present

## 2024-05-10 DIAGNOSIS — L97919 Non-pressure chronic ulcer of unspecified part of right lower leg with unspecified severity: Secondary | ICD-10-CM

## 2024-05-10 DIAGNOSIS — I83029 Varicose veins of left lower extremity with ulcer of unspecified site: Secondary | ICD-10-CM

## 2024-05-10 DIAGNOSIS — Z515 Encounter for palliative care: Secondary | ICD-10-CM | POA: Diagnosis not present

## 2024-05-10 DIAGNOSIS — L89159 Pressure ulcer of sacral region, unspecified stage: Secondary | ICD-10-CM

## 2024-05-10 DIAGNOSIS — L97929 Non-pressure chronic ulcer of unspecified part of left lower leg with unspecified severity: Secondary | ICD-10-CM

## 2024-05-10 DIAGNOSIS — E43 Unspecified severe protein-calorie malnutrition: Secondary | ICD-10-CM

## 2024-05-10 DIAGNOSIS — I83019 Varicose veins of right lower extremity with ulcer of unspecified site: Secondary | ICD-10-CM

## 2024-05-10 LAB — BASIC METABOLIC PANEL WITH GFR
Anion gap: 10 (ref 5–15)
BUN: <5 mg/dL — ABNORMAL LOW (ref 8–23)
CO2: 25 mmol/L (ref 22–32)
Calcium: 8.7 mg/dL — ABNORMAL LOW (ref 8.9–10.3)
Chloride: 102 mmol/L (ref 98–111)
Creatinine, Ser: 0.51 mg/dL (ref 0.44–1.00)
GFR, Estimated: >60 mL/min (ref >60)
Glucose, Bld: 98 mg/dL (ref 70–99)
Potassium: 4.6 mmol/L (ref 3.5–5.1)
Sodium: 137 mmol/L (ref 135–145)

## 2024-05-10 LAB — CBC WITH DIFFERENTIAL/PLATELET
Abs Immature Granulocytes: 0.04 K/uL (ref 0.00–0.07)
Basophils Absolute: 0 K/uL (ref 0.0–0.1)
Basophils Relative: 0 %
Eosinophils Absolute: 0.1 K/uL (ref 0.0–0.5)
Eosinophils Relative: 1 %
HCT: 29.2 % — ABNORMAL LOW (ref 36.0–46.0)
Hemoglobin: 9.6 g/dL — ABNORMAL LOW (ref 12.0–15.0)
Immature Granulocytes: 0 %
Lymphocytes Relative: 10 %
Lymphs Abs: 1 K/uL (ref 0.7–4.0)
MCH: 28 pg (ref 26.0–34.0)
MCHC: 32.9 g/dL (ref 30.0–36.0)
MCV: 85.1 fL (ref 80.0–100.0)
Monocytes Absolute: 0.8 K/uL (ref 0.1–1.0)
Monocytes Relative: 8 %
Neutro Abs: 8.5 K/uL — ABNORMAL HIGH (ref 1.7–7.7)
Neutrophils Relative %: 81 %
Platelets: 298 K/uL (ref 150–400)
RBC: 3.43 MIL/uL — ABNORMAL LOW (ref 3.87–5.11)
RDW: 13.1 % (ref 11.5–15.5)
WBC: 10.4 K/uL (ref 4.0–10.5)
nRBC: 0 % (ref 0.0–0.2)

## 2024-05-10 MED ORDER — JUVEN PO PACK
1.0000 | PACK | Freq: Two times a day (BID) | ORAL | Status: DC
  Administered 2024-05-10 – 2024-05-15 (×11): 1 via ORAL
  Filled 2024-05-10 (×11): qty 1

## 2024-05-10 MED ORDER — ENSURE PLUS HIGH PROTEIN PO LIQD
237.0000 mL | Freq: Three times a day (TID) | ORAL | Status: DC
Start: 2024-05-10 — End: 2024-05-10
  Administered 2024-05-10: 237 mL via ORAL

## 2024-05-10 MED ORDER — NEPRO/CARBSTEADY PO LIQD
237.0000 mL | Freq: Three times a day (TID) | ORAL | Status: DC
  Administered 2024-05-11 – 2024-05-15 (×12): 237 mL via ORAL

## 2024-05-10 MED ORDER — THIAMINE MONONITRATE 100 MG PO TABS
100.0000 mg | ORAL_TABLET | Freq: Every day | ORAL | Status: DC
  Administered 2024-05-10 – 2024-05-15 (×5): 100 mg via ORAL
  Filled 2024-05-10 (×6): qty 1

## 2024-05-10 NOTE — Progress Notes (Signed)
 [facility] PROGRESS NOTE    Progress Note  VERYL ABRIL  FMW:[id number] DOB: [birth year] DOA: 05/07/2024 PCP: Benjamine Aland, MD     Brief Narrative:   [name] is an 83 y.o. female past medical history of hypertension, venous insufficiency, the patient has been bedbound for the past 2 years and continues to lose weight and failure to thrive family went in to her house and discovered malodorous smell admitted for gangrenous right and left feet   Assessment/Plan:   Gangrene of lower extremity (HCC) Started empirically on antibiotics orthopedic surgery was consulted and family would like to proceed with conservative management. If the patient becomes septic and unstable will require an above-the-knee amputation. Continue dressing changes. Continue Rocephin  Flagyl  and linezolid .  Sacral decubitus ulcer: Noted.  Hypokalemia: Repleted now improved  Chronic venous insufficiency: Wound care was consulted.  Essential hypertension: Continue amlodipine  Aldactone  and labetalol .  Normocytic anemia: Hemoglobin has remained relatively stable.  Chronic  Diastolic CHF (HCC) Continue labetalol  and Aldactone .  Severe protein caloric malnutrition: Nutrition has been consulted. Her prealbumin is 5. Will consult palliative care.   DVT prophylaxis: lovenox  Family Communication:Daughter Status is: Inpatient Remains inpatient appropriate because: Gangrene of lower extremities    Code Status:     Code Status Orders  (From admission, onward)           Start     Ordered   05/07/24 1647  Do not attempt resuscitation (DNR)- Limited -Do Not Intubate (DNI)  Continuous       Question Answer Comment  If pulseless and not breathing No CPR or chest compressions.   In Pre-Arrest Conditions (Patient Is Breathing and Has A Pulse) Do not intubate. Provide all appropriate non-invasive medical interventions. Avoid ICU transfer unless indicated or required.   Consent:  Discussion documented in EHR or advanced directives reviewed      05/07/24 1652           Code Status History     Date Active Date Inactive Code Status Order ID Comments User Context   06/30/2019 2144 07/11/2019 0023 Full Code [id number]  Cheryle Debby LABOR, MD Inpatient   01/31/2016 0342 02/02/2016 1304 Full Code [id number]  Geralynn Charleston, MD Inpatient   01/28/2016 0037 01/30/2016 1709 Full Code [id number]  Lonzell Emeline HERO, DO ED   05/29/2014 0843 05/29/2014 1412 Full Code [id number]  Ladona Heinz, MD Inpatient         IV Access:   Peripheral IV   Procedures and diagnostic studies:   No results found.   Medical Consultants:   None.   Subjective:    JENNFIER ABDULLA no complaints nonverbal.  Objective:    Vitals:   05/09/24 2113 05/09/24 2318 05/10/24 0317 05/10/24 0754  BP: 112/87 108/75 (!) 120/53 129/61  Pulse: 73 72 72 72  Resp:    15  Temp: (!) 95.2 F (35.1 C) 98.6 F (37 C) 98.5 F (36.9 C) 97.8 F (36.6 C)  TempSrc:      SpO2: 99% 100% 100% 100%  Weight:       SpO2: 100 %   Intake/Output Summary (Last 24 hours) at 05/10/2024 1033 Last data filed at 05/09/2024 2000 Gross per 24 hour  Intake 3 ml  Output --  Net 3 ml   Filed Weights   05/07/24 1200  Weight: 47.6 kg    Exam: General exam: In no acute distress. Respiratory system: Good air movement and clear to auscultation. Cardiovascular system: S1 & S2 heard, RRR.  No JVD, murmurs, rubs, gallops or clicks.  Gastrointestinal system: Abdomen is nondistended, soft and nontender.  Central nervous system: Alert and oriented. No focal neurological deficits. Extremities: No pedal edema. Skin: Psychiatry: No judgment or insight of medical condition.   Data Reviewed:    Labs: Basic Metabolic Panel: Recent Labs  Lab 05/07/24 1152 05/09/24 0426 05/10/24 0637  NA 137 135 137  K 3.3* 5.0 4.6  CL 102 106 102  CO2 27 18* 25  GLUCOSE 93 109* 98  BUN 9 7* <5*  CREATININE 0.52 0.55 0.51   CALCIUM 8.5* 8.5* 8.7*   GFR CrCl cannot be calculated (Unknown ideal weight.). Liver Function Tests: Recent Labs  Lab 05/07/24 1152  AST 13*  ALT 10  ALKPHOS 72  BILITOT 0.7  PROT 6.5  ALBUMIN  2.4*   No results for input(s): LIPASE, AMYLASE in the last 168 hours. No results for input(s): AMMONIA in the last 168 hours. Coagulation profile Recent Labs  Lab 05/07/24 1152  INR 1.1   COVID-19 Labs  Recent Labs    05/07/24 1255  FERRITIN 161  CRP 9.6*    Lab Results  Component Value Date   SARSCOV2NAA NEGATIVE 05/07/2024   SARSCOV2NAA NEGATIVE 06/30/2019    CBC: Recent Labs  Lab 05/07/24 1152 05/09/24 0426 05/10/24 0637  WBC 11.6* 12.1* 10.4  NEUTROABS 9.9* 10.0* 8.5*  HGB 9.9* 9.9* 9.6*  HCT 30.9* 30.3* 29.2*  MCV 88.5 86.1 85.1  PLT 315 297 298   Cardiac Enzymes: No results for input(s): CKTOTAL, CKMB, CKMBINDEX, TROPONINI in the last 168 hours. BNP (last 3 results) No results for input(s): PROBNP in the last 8760 hours. CBG: No results for input(s): GLUCAP in the last 168 hours. D-Dimer: No results for input(s): DDIMER in the last 72 hours. Hgb A1c: Recent Labs    05/07/24 1643  HGBA1C 5.3   Lipid Profile: No results for input(s): CHOL, HDL, LDLCALC, TRIG, CHOLHDL, LDLDIRECT in the last 72 hours. Thyroid  function studies: No results for input(s): TSH, T4TOTAL, T3FREE, THYROIDAB in the last 72 hours.  Invalid input(s): FREET3 Anemia work up: Recent Labs    05/07/24 1255  VITAMINB12 253  FERRITIN 161  TIBC 205*  IRON 10*   Sepsis Labs: Recent Labs  Lab 05/07/24 1152 05/07/24 1200 05/07/24 1403 05/09/24 0426 05/10/24 0637  WBC 11.6*  --   --  12.1* 10.4  LATICACIDVEN  --  0.7 0.9  --   --    Microbiology Recent Results (from the past 240 hours)  Resp panel by RT-PCR (RSV, Flu A&B, Covid) Anterior Nasal Swab     Status: None   Collection Time: 05/07/24 11:21 AM   Specimen: Anterior  Nasal Swab  Result Value Ref Range Status   SARS Coronavirus 2 by RT PCR NEGATIVE NEGATIVE Final    Comment: (NOTE) SARS-CoV-2 target nucleic acids are NOT DETECTED.  The SARS-CoV-2 RNA is generally detectable in upper respiratory specimens during the acute phase of infection. The lowest concentration of SARS-CoV-2 viral copies this assay can detect is 138 copies/mL. A negative result does not preclude SARS-Cov-2 infection and should not be used as the sole basis for treatment or other patient management decisions. A negative result may occur with  improper specimen collection/handling, submission of specimen other than nasopharyngeal swab, presence of viral mutation(s) within the areas targeted by this assay, and inadequate number of viral copies(<138 copies/mL). A negative result must be combined with clinical observations, patient history, and epidemiological information. The expected result  is Negative.  Fact Sheet for Patients:  BloggerCourse.com  Fact Sheet for Healthcare Providers:  SeriousBroker.it  This test is no t yet approved or cleared by the [place]  FDA and  has been authorized for detection and/or diagnosis of SARS-CoV-2 by FDA under an Emergency Use Authorization (EUA). This EUA will remain  in effect (meaning this test can be used) for the duration of the COVID-19 declaration under Section 564(b)(1) of the Act, 21 U.S.C.section 360bbb-3(b)(1), unless the authorization is terminated  or revoked sooner.       Influenza A by PCR NEGATIVE NEGATIVE Final   Influenza B by PCR NEGATIVE NEGATIVE Final    Comment: (NOTE) The Xpert Xpress SARS-CoV-2/FLU/RSV plus assay is intended as an aid in the diagnosis of influenza from Nasopharyngeal swab specimens and should not be used as a sole basis for treatment. Nasal washings and aspirates are unacceptable for Xpert Xpress SARS-CoV-2/FLU/RSV testing.  Fact Sheet for  Patients: BloggerCourse.com  Fact Sheet for Healthcare Providers: SeriousBroker.it  This test is not yet approved or cleared by the [place]  FDA and has been authorized for detection and/or diagnosis of SARS-CoV-2 by FDA under an Emergency Use Authorization (EUA). This EUA will remain in effect (meaning this test can be used) for the duration of the COVID-19 declaration under Section 564(b)(1) of the Act, 21 U.S.C. section 360bbb-3(b)(1), unless the authorization is terminated or revoked.     Resp Syncytial Virus by PCR NEGATIVE NEGATIVE Final    Comment: (NOTE) Fact Sheet for Patients: BloggerCourse.com  Fact Sheet for Healthcare Providers: SeriousBroker.it  This test is not yet approved or cleared by the [place]  FDA and has been authorized for detection and/or diagnosis of SARS-CoV-2 by FDA under an Emergency Use Authorization (EUA). This EUA will remain in effect (meaning this test can be used) for the duration of the COVID-19 declaration under Section 564(b)(1) of the Act, 21 U.S.C. section 360bbb-3(b)(1), unless the authorization is terminated or revoked.  Performed at [facility], [address] [place], [place] [address]   Urine Culture     Status: Abnormal   Collection Time: 05/07/24 11:21 AM   Specimen: Urine, Random  Result Value Ref Range Status   Specimen Description   Final    URINE, RANDOM Performed at [facility], [address] [place], [place] [address]    Special Requests   Final    NONE Reflexed from [id number] Performed at [facility], [address] [place] [address], [place], [place] [address]    Culture >=100,000 COLONIES/mL ENTEROCOCCUS FAECALIS (A)  Final   Report Status 05/09/2024 FINAL  Final   Organism ID, Bacteria ENTEROCOCCUS FAECALIS (A)  Final      Susceptibility   Enterococcus faecalis - MIC*    AMPICILLIN  <=2 SENSITIVE Sensitive      NITROFURANTOIN <=16 SENSITIVE Sensitive     VANCOMYCIN  1 SENSITIVE Sensitive     * >=100,000 COLONIES/mL ENTEROCOCCUS FAECALIS  Blood Culture (routine x 2)     Status: None (Preliminary result)   Collection Time: 05/07/24 11:52 AM   Specimen: BLOOD  Result Value Ref Range Status   Specimen Description BLOOD BLOOD LEFT ARM  Final   Special Requests   Final    BOTTLES DRAWN AEROBIC AND ANAEROBIC Blood Culture adequate volume   Culture   Final    NO GROWTH 3 DAYS Performed at [facility], [address] [place], [place] [address]    Report Status PENDING  Incomplete  Blood Culture (routine x 2)  Status: None (Preliminary result)   Collection Time: 05/07/24 12:00 PM   Specimen: BLOOD  Result Value Ref Range Status   Specimen Description BLOOD BLOOD RIGHT ARM  Final   Special Requests   Final    BOTTLES DRAWN AEROBIC AND ANAEROBIC Blood Culture adequate volume   Culture   Final    NO GROWTH 3 DAYS Performed at [facility], [address] [place], [place] [address]    Report Status PENDING  Incomplete     Medications:    amLODipine   10 mg Oral Daily   brimonidine   1 drop Both Eyes BID   And   timolol   1 drop Both Eyes BID   citalopram   10 mg Oral Daily   cyanocobalamin   1,000 mcg Oral Daily   dorzolamide   1 drop Both Eyes BID   feeding supplement  1 Container Oral TID BM   gabapentin   300 mg Oral BID   labetalol   100 mg Oral BID   liver oil-zinc  oxide  1 Application Topical BID   multivitamin with minerals  1 tablet Oral Daily   pantoprazole   40 mg Oral Daily   spironolactone   25 mg Oral BID   traZODone   50 mg Oral QHS   Continuous Infusions:  cefTRIAXone  (ROCEPHIN )  IV 2 g (05/09/24 1739)   And   metronidazole  500 mg (05/10/24 0403)   And   linezolid  (ZYVOX ) IV 600 mg (05/09/24 2300)      LOS: 3 days   Erle Odell Castor  [facility]  05/10/2024, 10:33 AM

## 2024-05-10 NOTE — Progress Notes (Signed)
 Wound care and dressing change done per provider's orders. Patient tolerated it fairly well. Still continue to have drainage with foul ( rotten ) odor.

## 2024-05-10 NOTE — Plan of Care (Signed)

## 2024-05-10 NOTE — Consult Note (Signed)
 Palliative Care Consult Note                                  Date: 05/10/2024   Patient Name: Brenda Kerr  DOB: [birth year]  MRN: [id number]  Age / Sex: 83 y.o., female  PCP: Benjamine Aland, MD Referring Physician: Odell Castor, Erle, MD  Reason for Consultation: Establishing goals of care  Past Medical History:  Diagnosis Date   Arthritis    Diabetes mellitus without complication (HCC)    Dysrhythmia    GERD (gastroesophageal reflux disease)    Glaucoma    H/O hiatal hernia    Hypertension    Peripheral vascular disease (HCC)    Shortness of breath    walk a long way    Subjective:   This NP Camellia Kays reviewed medical records, received report from team, assessed the patient and then meet at the patient's bedside to discuss diagnosis, prognosis, GOC, EOL wishes disposition and options.  Before meeting with the patient/family, I spent time reviewing the chart notes including admission H&P, nursing note from 2 days ago, orthopedic surgery note from 2 days ago, hospice note from 2 days ago, nursing note from yesterday, hospitalist note from yesterday, Orthopedic surgery note from yesterday, nursing notes from today, internal medicine note from today, TOC note from today. I also reviewed vital signs, nursing flowsheets, medication administrations record, labs, and imaging. Labs reviewed include lactic acid from 9/7 which is normal and reassuring in the setting of gangrene and concern for infection, CBC from yesterday and today which shows admission white count of elevated at 11.6, increased to 12.1 yesterday, improved to 10.4 today on antibiotics in the setting of gangrene infection with progressive decrease in leukocytosis reassuring.  BMP shows normal electrolytes and kidney function which is again reassuring in the setting of infection and risk for renal impairment that could affect drug selection.  I met with the patient at the  bedside, 2 family friends and patient's spouse were present.  Family friend called the patient's daughter Gailen on the phone.   We meet to discuss diagnosis prognosis, GOC, EOL wishes, disposition and options. Concept of Palliative Care was introduced as specialized medical care for people and their families living with serious illness.  If focuses on providing relief from the symptoms and stress of a serious illness.  The goal is to improve quality of life for both the patient and the family. Values and goals of care important to patient and family were attempted to be elicited.  Created space and opportunity for patient  and family to explore thoughts and feelings regarding current medical situation   Natural trajectory and current clinical status were discussed. Questions and concerns addressed. Patient  encouraged to call with questions or concerns.    Today's Discussion: Today, in addition to introducing palliative medicine and our role in healthcare team as well as offered to assist with conversations and decision making, I spent time speaking with patient's family.  I did not wake the patient and she was sleeping and chose to not disturb her.  The patient seems to have had a progressive decline, has private CNA at home for 5 days a week 2 hours a day.  Family has noted decreased appetite since Mother's Day, has been bedbound for some time.  We discussed the importance of ongoing goals of care conversations.    The patient's daughter [name] prefers GOC conversation  to be in person with both herself and her sister present.  I also offered that other people who she would want to participate, if they cannot participate in person could participate over the phone, FaceTime, etc.  I provided a copy of our card with contact information which her friend will take a picture of and text to her.  She will reach out to her sister this evening and coordinate a date/time that works well for them to meet for a full  goals of care conversation.  I shared that I would check in tomorrow for status and to help coordinate. I provided emotional and general support through therapeutic listening, empathy, sharing of stories, and other techniques. I answered all questions and addressed all concerns to the best of my ability.  Goals: TBD after goals of care family meeting  Review of Systems  Unable to perform ROS   Objective:   Primary Diagnoses: Present on Admission:  Diastolic CHF (HCC)  Anemia  Essential hypertension  Venous stasis ulcers of both lower extremities (HCC)  Hypokalemia  Gangrene of lower extremity (HCC)  Sacral decubitus ulcer  Protein-calorie malnutrition, severe (HCC)   Vital Signs:  BP 124/65 (BP Location: Right Wrist)   Pulse 72   Temp 97.8 F (36.6 C)   Resp 16   Wt 47.6 kg   SpO2 100%   BMI 19.20 kg/m   Physical Exam Vitals and nursing note reviewed.  Constitutional:      General: She is sleeping. She is not in acute distress. HENT:     Head: Normocephalic and atraumatic.  Cardiovascular:     Rate and Rhythm: Normal rate.  Pulmonary:     Effort: Pulmonary effort is normal. No respiratory distress.  Abdominal:     General: Abdomen is flat.  Skin:    General: Skin is warm and dry.     Palliative Assessment/Data: 30%   Assessment & Plan:   HPI/Patient Profile: 83 y.o. female  with past medical history of HTN, venous insufficiency admitted with gangrenous right and left feet. Patient has been bedbound for the proximately 2 years and continues to lose weight and have generalized failure to thrive. Family brings her in today with a discovered a malodorous wound in her feet.  She was admitted on 05/07/2024 with gangrene of the lower extremities, sacral decubitus ulcer, hypokalemia, venous stasis ulcers of bilateral feet, diastolic CHF, severe protein calorie malnutrition.   SUMMARY OF RECOMMENDATIONS   DNR-limited Continue current scope of care Plan for full  family meeting/goals of care in the coming days per family scheduling Palliative medicine will continue to follow  Symptom Management:  Per primary team Palliative medicine is available to assist as needed  Code Status: DNR - Limited (DNR/DNI)  Prognosis:  Unable to determine  Discharge Planning:  To Be Determined   Discussed with: Patient's family, medical team, nursing team, [facility]    Thank you for allowing us  to participate in the care of COREY CAULFIELD PMT will continue to support holistically.  Billing based on MDM: High  Problems Addressed: One or more chronic illnesses with severe exacerbation, progression, or side effects of treatment.  Amount and/or Complexity of Data: Category 1:Review of prior external note(s) from each unique source, Review of the result(s) of each unique test, and Assessment requiring an independent historian(s) and Category 3:Discussion of management or test interpretation with external physician/other qualified health care professional/appropriate source (not separately reported)  Risks: N/A  Detailed review of medical records (labs,  imaging, vital signs), medically appropriate exam, discussed with treatment team, counseling and education to patient, family, & staff, documenting clinical information, medication management, coordination of care  Signed by: Camellia Kays, NP Palliative Medicine Team  Team Phone # [phone] (Nights/Weekends)  05/10/2024, 3:58 PM

## 2024-05-10 NOTE — Care Management Important Message (Signed)
 Important Message  Patient Details  Name: Brenda Kerr MRN: [id number] Date of Birth: [birth year]   Important Message Given:  Yes - Medicare IM     Claretta Deed 05/10/2024, 2:42 PM

## 2024-05-10 NOTE — Plan of Care (Signed)

## 2024-05-10 NOTE — Progress Notes (Signed)
 Initial Nutrition Assessment  DOCUMENTATION CODES:   Severe malnutrition in context of chronic illness  INTERVENTION:  -Continue soft diet, thin liquids -Add Nepro TID for concentrated kcal/pro -Add MVI -Add Juven BID -Discussed no restrictions to pt's diet, encouraged family to continue to offer foods/fluids  NUTRITION DIAGNOSIS:   Severe Malnutrition related to acute illness, chronic illness, lethargy/confusion, decreased appetite as evidenced by severe muscle depletion, severe fat depletion, edema.  GOAL:   Patient will meet greater than or equal to 90% of their needs  MONITOR:   PO intake, Skin, Supplement acceptance, Weight trends, Labs  REASON FOR ASSESSMENT:   Consult Poor PO, Calorie Count, Assessment of nutrition requirement/status  ASSESSMENT:   Hx HTN, venous insufficiency admitted with gangrenous right and left feet. Patient has been bedbound for the proximately 2 years and continues to lose weight and have generalized failure to thrive.  Spoke to pt's family in room as pt is nonverbal at this time. No noted n/v/c/d or particular chewing/swallowing difficulties at baseline. Last BM 9/10. Pt with no documented meal intake. Family is offering mostly fluids to pt, which she is taking sips. No documented weight hx x 12 months, though pt's family endorses significant loss. NFPE completed (see below), pt with severe protein calorie malnutrition. Pt's family states that she had good PO intake at home, though, continued to lose weight. Some concern per MD note of malignancy. Will add Nepro TID to provide concentrated kcal/pro ONS, add Juven BID, Add MVI. Pt does have consult to palliative, continues to be DNR-limited. Will continue to monitor for GOC moving forward. Pt's family deny additional questions/concerns at this time, will continue to monitor, RDN available prn.   Labs BUN <5 Calcium 8.7 Albumin  2.4 AST 13 Prealbumin 5 CRP 9.6 H/H 9.6/29.2  Medications   amLODipine   10 mg Oral Daily   brimonidine   1 drop Both Eyes BID   And   timolol   1 drop Both Eyes BID   citalopram   10 mg Oral Daily   cyanocobalamin   1,000 mcg Oral Daily   dorzolamide   1 drop Both Eyes BID   feeding supplement  237 mL Oral TID BM   gabapentin   300 mg Oral BID   labetalol   100 mg Oral BID   liver oil-zinc  oxide  1 Application Topical BID   multivitamin with minerals  1 tablet Oral Daily   nutrition supplement (JUVEN)  1 packet Oral BID BM   pantoprazole   40 mg Oral Daily   spironolactone   25 mg Oral BID   thiamine   100 mg Oral Daily   traZODone   50 mg Oral QHS     NUTRITION - FOCUSED PHYSICAL EXAM:  Flowsheet Row Most Recent Value  Orbital Region Severe depletion  Upper Arm Region Severe depletion  Thoracic and Lumbar Region Severe depletion  Buccal Region Severe depletion  Temple Region Severe depletion  Clavicle Bone Region Severe depletion  Clavicle and Acromion Bone Region Severe depletion  Scapular Bone Region Severe depletion  Dorsal Hand Severe depletion  Patellar Region Severe depletion  Anterior Thigh Region Severe depletion  Posterior Calf Region Severe depletion  Edema (RD Assessment) Mild  Hair Reviewed  Eyes Unable to assess  [Doesn't open eyes for this author]  Mouth Unable to assess  [Doesn't open mouth for this author]  Skin Reviewed  Nails Reviewed    Diet Order:   Diet Order             DIET SOFT Room service appropriate? Yes;  Fluid consistency: Thin  Diet effective now                   EDUCATION NEEDS:   Education needs have been addressed (With family)  Skin:  Skin Assessment: Skin Integrity Issues: Skin Integrity Issues:: Unstageable, Other (Comment) Unstageable: Coccyx Other: Gangrene to left and right toes  Last BM:  9/10  Height:   Ht Readings from Last 1 Encounters:  06/30/19 5' 2 (1.575 m)    Weight:   Wt Readings from Last 1 Encounters:  05/07/24 47.6 kg    BMI:  Body mass index is 19.2  kg/m.  Estimated Nutritional Needs:   Kcal:  1200-1400 kcal  Protein:  60-70 g  Fluid:  >/=1.5 L  Nusaybah Ivie Daml-Budig, RDN, LDN Registered Dietitian Nutritionist RD Inpatient Contact Info in [place]

## 2024-05-10 NOTE — Progress Notes (Addendum)
 Transition of Care [facility]) - Inpatient Brief Assessment   Patient Details  Name: Brenda Kerr MRN: [id number] Date of Birth: [birth year]  [facility] CM/SW Contact:    Rosaline JONELLE Joe, RN Phone Number: 05/10/2024, 2:17 PM   Clinical Narrative: CM met with the patient/family at the bedside.  Patient admitted to the hospital for bilateral wound of lower extremities - with gangrene of lower extremities - currently wrapped with Kerlix dressing. Patient lives at home with spouse and has private CNA 5 days a week for 2 hours a day.  Patient has been bed-bound for 2 years.  Patient with decreased appetite since Mother's Day per daughter.    DME at the home includes hospital bed, air mattress.  Daughter states that patient is bed bound with inability utilize wheelchair due to contractures.  Patient's family pays for diapers/incontinence supplies out of pocket.  No home health agency at the home at this time.  Patient is pending consult by Palliative Care at this time.  PCP is ALONSO Leech, MD that makes home visits at the home.  Patient's daughter states that patient will need PTAR arrangements for transportation if needed.  Palliative Care Team will likely meet with the patient's family on Friday of this week per Marvis, NP with Palliative Care.  Transition of Care Asessment: Insurance and Status: (P) Insurance coverage has been reviewed Patient has primary care physician: (P) Yes Home environment has been reviewed: (P) from home with spouse Prior level of function:: (P) bed bound for the past 2 years Prior/Current Home Services: (P) Current home services (Patient has a privately hired CNA at the home that provide care Monday-Friday from 930 am to 1130 am paid for by family) Social Drivers of Health Review: (P) SDOH reviewed needs interventions Readmission risk has been reviewed: (P) Yes Transition of care needs: (P) transition of care needs identified, TOC will continue to  follow

## 2024-05-11 DIAGNOSIS — Z7189 Other specified counseling: Secondary | ICD-10-CM | POA: Diagnosis not present

## 2024-05-11 DIAGNOSIS — I96 Gangrene, not elsewhere classified: Secondary | ICD-10-CM | POA: Diagnosis not present

## 2024-05-11 DIAGNOSIS — Z66 Do not resuscitate: Secondary | ICD-10-CM | POA: Diagnosis not present

## 2024-05-11 DIAGNOSIS — Z515 Encounter for palliative care: Secondary | ICD-10-CM | POA: Diagnosis not present

## 2024-05-11 DIAGNOSIS — E876 Hypokalemia: Secondary | ICD-10-CM

## 2024-05-11 DIAGNOSIS — D649 Anemia, unspecified: Secondary | ICD-10-CM

## 2024-05-11 MED ORDER — METRONIDAZOLE 500 MG PO TABS
500.0000 mg | ORAL_TABLET | Freq: Two times a day (BID) | ORAL | Status: DC
  Administered 2024-05-11 (×2): 500 mg via ORAL
  Filled 2024-05-11 (×2): qty 1

## 2024-05-11 MED ORDER — LINEZOLID 600 MG PO TABS
600.0000 mg | ORAL_TABLET | Freq: Two times a day (BID) | ORAL | Status: AC
Start: 2024-05-11 — End: 2024-05-15
  Administered 2024-05-11 – 2024-05-15 (×8): 600 mg via ORAL
  Filled 2024-05-11 (×8): qty 1

## 2024-05-11 NOTE — Progress Notes (Signed)
 Ordered her lunch and dinner at this time

## 2024-05-11 NOTE — Progress Notes (Signed)
 Daily Progress Note   Date: 05/11/2024   Patient Name: Brenda Kerr  DOB: 1941/08/03  MRN: [id number]  Age / Sex: 83 y.o., female  Attending Physician: Odell Celinda Balo, MD Primary Care Physician: Benjamine Aland, MD Admit Date: 05/07/2024 Length of Stay: 4 days  Reason for Follow-up: Establishing goals of care  Past Medical History:  Diagnosis Date   Arthritis    Diabetes mellitus without complication (HCC)    Dysrhythmia    GERD (gastroesophageal reflux disease)    Glaucoma    H/O hiatal hernia    Hypertension    Peripheral vascular disease (HCC)    Shortness of breath    walk a long way    Subjective:   Subjective: Chart Reviewed. Updates received. Patient Assessed. Created space and opportunity for patient  and family to explore thoughts and feelings regarding current medical situation.  Today's Discussion: Today before meeting with the patient/family, I reviewed the chart notes including nursing note from yesterday, nursing note from today, internal medicine note from today. I also reviewed vital signs, nursing flowsheets, medication administrations record, labs, and imaging.  No labs were completed in the past 24 hours.  Today saw the patient at bedside, her daughter Brenda Kerr was present.  I reminded her that we have spoke on phone yesterday with other family members in the room as well.  I spent time reintroducing the role of palliative medicine and reminding her of our role on the healthcare team.  Initially she thought we were hospice but I clarified that we help talk through clinical situations, understanding where she is at medically, discussing options and what each option means for her life to assist family in making the best decisions they can for the patient ensuring that the patient's goals and wishes are considered and decision making.  The patient is quite ill and has complicated illness and there are potential paths moving forward including possible need for  amputation.  However, we would need to discuss this scenario and decide if offered, would that be in her goals of care.  It does not seem that at this point there is any urgent need for specific decisions, however it is good to start the process of talking about the symptoms.  I reminded Brenda Kerr that yesterday we had talked about scheduling a time for her and her sister to be present, as well as other family members as desired, for full goals of care conversation.  She is unable to find a time with her sister yet.  She is thinking they may be able to meet sometime tomorrow or Saturday.  I ensured that she had the copy of hard choices for loving people that I left for her yesterday to help start conversations and thoughts prior to our meeting.  I also ensured she had her contact card and provided the name of my colleague Recardo, NP who will follow-up over the weekend for family meeting.  I requested she call when she has a date/time set up with her sister and leave a message with that date and time for Recardo; I provided scheduling parameters to assist.  I provided emotional and general support through therapeutic listening, empathy, sharing of stories, and other techniques. I answered all questions and addressed all concerns to the best of my ability.  Review of Systems  Unable to perform ROS   Objective:   Primary Diagnoses: Present on Admission:  Diastolic CHF (HCC)  Anemia  Essential hypertension  Venous stasis ulcers of both  lower extremities (HCC)  Hypokalemia  Gangrene of lower extremity (HCC)  Sacral decubitus ulcer  Protein-calorie malnutrition, severe (HCC)   Vital Signs:  BP (!) 116/49 (BP Location: Left Arm)   Pulse 66   Temp (!) 97.5 F (36.4 C)   Resp 15   Wt 47.6 kg   SpO2 100%   BMI 19.20 kg/m   Physical Exam Vitals and nursing note reviewed.  Constitutional:      General: She is sleeping. She is not in acute distress.    Appearance: She is ill-appearing.  HENT:      Head: Normocephalic and atraumatic.  Cardiovascular:     Rate and Rhythm: Normal rate.  Pulmonary:     Effort: Pulmonary effort is normal. No respiratory distress.  Abdominal:     General: Abdomen is flat.     Palliative Assessment/Data: 20-30%   Existing Vynca/ACP Documentation: None  Assessment & Plan:   HPI/Patient Profile:  83 y.o. female  with past medical history of HTN, venous insufficiency admitted with gangrenous right and left feet. Patient has been bedbound for the proximately 2 years and continues to lose weight and have generalized failure to thrive. Family brings her in today with a discovered a malodorous wound in her feet.  She was admitted on 05/07/2024 with gangrene of the lower extremities, sacral decubitus ulcer, hypokalemia, venous stasis ulcers of bilateral feet, diastolic CHF, severe protein calorie malnutrition.   SUMMARY OF RECOMMENDATIONS   DNR-limited Continue current scope of care Plan for full family meeting/goals of care in the coming days pending family scheduling They have our contact information to leave date/time that they can meet Palliative medicine will continue to follow  Symptom Management:  Per primary team Palliative medicine is available to assist as needed  Code Status: DNR - Limited (DNR/DNI)  Prognosis: Unable to determine  Discharge Planning: To Be Determined  Discussed with: Patient's family, medical team, nursing team  Thank you for allowing us  to participate in the care of Brenda Kerr PMT will continue to support holistically.  Billing based on MDM: Moderate  Detailed review of medical records (labs, imaging, vital signs), medically appropriate exam, discussed with treatment team, counseling and education to patient, family, & staff, documenting clinical information, medication management, coordination of care  Camellia Kays, NP Palliative Medicine Team  Team Phone # [phone] (Nights/Weekends)  04/29/2021, 8:17 AM

## 2024-05-11 NOTE — Progress Notes (Signed)
 PHARMACIST - PHYSICIAN COMMUNICATION DR:   Celinda CONCERNING: Antibiotic IV to Oral Route Change Policy  RECOMMENDATION: This patient is receiving flagyl  and linezolid  by the intravenous route.  Based on criteria approved by the Pharmacy and Therapeutics Committee, the antibiotic(s) is/are being converted to the equivalent oral dose form(s).   DESCRIPTION: These criteria include: Patient being treated for a respiratory tract infection, urinary tract infection, cellulitis or clostridium difficile associated diarrhea if on metronidazole  The patient is not neutropenic and does not exhibit a GI malabsorption state The patient is eating (either orally or via tube) and/or has been taking other orally administered medications for a least 24 hours The patient is improving clinically and has a Tmax < 100.5  If you have questions about this conversion, please contact the Pharmacy Department   [x]   [phone] )  Jolynn Pack   Elma Fail, PharmD PGY1 Clinical Pharmacist [facility]  05/11/2024 10:10 AM

## 2024-05-11 NOTE — Plan of Care (Signed)

## 2024-05-11 NOTE — Progress Notes (Signed)
 [facility] PROGRESS NOTE    Progress Note  Brenda Kerr  FMW:[id number] DOB: Aug 25, 1941 DOA: 05/07/2024 PCP: Benjamine Aland, MD     Brief Narrative:   Brenda Kerr is an 83 y.o. female past medical history of hypertension, venous insufficiency, the patient has been bedbound for the past 2 years and continues to lose weight and failure to thrive family went in to her house and discovered malodorous smell admitted for gangrenous right and left feet  Assessment/Plan:   Gangrene of lower extremity (HCC) Started empirically on antibiotics orthopedic surgery was consulted and family would like to proceed with conservative management.  She is not a good candidate for surgical intervention. If the patient becomes septic and unstable will require an above-the-knee amputation. Continue dressing changes. Continue Rocephin  Flagyl  and linezolid . Palliative care was consulted will meet with family to address goals of care. Patient has been deteriorating over the last several months with poor appetite and losing weight. She has a very poor prognosis  Unstagable Sacral decubitus ulcer: Noted.  Hypokalemia: Repleted now improved  Chronic venous insufficiency: Wound care was consulted.  Essential hypertension: Continue amlodipine  Aldactone  and labetalol .  Normocytic anemia: Hemoglobin has remained relatively stable.  Chronic  Diastolic CHF (HCC) Continue labetalol  and Aldactone .  Severe protein caloric malnutrition: Nutrition has been consulted. Her prealbumin is 5.   DVT prophylaxis: lovenox  Family Communication:Daughter Status is: Inpatient Remains inpatient appropriate because: Gangrene of lower extremities    Code Status:     Code Status Orders  (From admission, onward)           Start     Ordered   05/07/24 1647  Do not attempt resuscitation (DNR)- Limited -Do Not Intubate (DNI)  Continuous       Question Answer Comment  If pulseless and not breathing  No CPR or chest compressions.   In Pre-Arrest Conditions (Patient Is Breathing and Has A Pulse) Do not intubate. Provide all appropriate non-invasive medical interventions. Avoid ICU transfer unless indicated or required.   Consent: Discussion documented in EHR or advanced directives reviewed      05/07/24 1652           Code Status History     Date Active Date Inactive Code Status Order ID Comments User Context   06/30/2019 2144 07/11/2019 0023 Full Code [id number]  Cheryle Debby LABOR, MD Inpatient   01/31/2016 0342 02/02/2016 1304 Full Code [id number]  Geralynn Charleston, MD Inpatient   01/28/2016 0037 01/30/2016 1709 Full Code [id number]  Lonzell Emeline HERO, DO ED   05/29/2014 0843 05/29/2014 1412 Full Code [id number]  Ladona Heinz, MD Inpatient         IV Access:   Peripheral IV   Procedures and diagnostic studies:   No results found.   Medical Consultants:   None.   Subjective:    Brenda Kerr no complaints nonverbal.  Objective:    Vitals:   05/10/24 1208 05/10/24 1606 05/11/24 0332 05/11/24 0729  BP: 124/65 (!) 120/55 (!) 122/59 (!) 119/56  Pulse: 72 74 71 69  Resp: 16 15  18   Temp: 97.8 F (36.6 C) 98.7 F (37.1 C) (!) 97.5 F (36.4 C) 97.9 F (36.6 C)  TempSrc:    Axillary  SpO2: 100% 98% 100% 100%  Weight:       SpO2: 100 %   Intake/Output Summary (Last 24 hours) at 05/11/2024 0808 Last data filed at 05/10/2024 2100 Gross per 24 hour  Intake 1806.22 ml  Output 1000  ml  Net 806.22 ml   Filed Weights   05/07/24 1200  Weight: 47.6 kg    Exam: General exam: In no acute distress. Respiratory system: Good air movement and clear to auscultation. Cardiovascular system: S1 & S2 heard, RRR. No JVD, murmurs, rubs, gallops or clicks.  Gastrointestinal system: Abdomen is nondistended, soft and nontender.  Central nervous system: Alert and oriented. No focal neurological deficits. Extremities: No pedal edema. Skin: Psychiatry: No judgment or insight of  medical condition.   Data Reviewed:    Labs: Basic Metabolic Panel: Recent Labs  Lab 05/07/24 1152 05/09/24 0426 05/10/24 0637  NA 137 135 137  K 3.3* 5.0 4.6  CL 102 106 102  CO2 27 18* 25  GLUCOSE 93 109* 98  BUN 9 7* <5*  CREATININE 0.52 0.55 0.51  CALCIUM 8.5* 8.5* 8.7*   GFR CrCl cannot be calculated (Unknown ideal weight.). Liver Function Tests: Recent Labs  Lab 05/07/24 1152  AST 13*  ALT 10  ALKPHOS 72  BILITOT 0.7  PROT 6.5  ALBUMIN  2.4*   No results for input(s): LIPASE, AMYLASE in the last 168 hours. No results for input(s): AMMONIA in the last 168 hours. Coagulation profile Recent Labs  Lab 05/07/24 1152  INR 1.1   COVID-19 Labs  No results for input(s): DDIMER, FERRITIN, LDH, CRP in the last 72 hours.   Lab Results  Component Value Date   SARSCOV2NAA NEGATIVE 05/07/2024   SARSCOV2NAA NEGATIVE 06/30/2019    CBC: Recent Labs  Lab 05/07/24 1152 05/09/24 0426 05/10/24 0637  WBC 11.6* 12.1* 10.4  NEUTROABS 9.9* 10.0* 8.5*  HGB 9.9* 9.9* 9.6*  HCT 30.9* 30.3* 29.2*  MCV 88.5 86.1 85.1  PLT 315 297 298   Cardiac Enzymes: No results for input(s): CKTOTAL, CKMB, CKMBINDEX, TROPONINI in the last 168 hours. BNP (last 3 results) No results for input(s): PROBNP in the last 8760 hours. CBG: No results for input(s): GLUCAP in the last 168 hours. D-Dimer: No results for input(s): DDIMER in the last 72 hours. Hgb A1c: No results for input(s): HGBA1C in the last 72 hours.  Lipid Profile: No results for input(s): CHOL, HDL, LDLCALC, TRIG, CHOLHDL, LDLDIRECT in the last 72 hours. Thyroid  function studies: No results for input(s): TSH, T4TOTAL, T3FREE, THYROIDAB in the last 72 hours.  Invalid input(s): FREET3 Anemia work up: No results for input(s): VITAMINB12, FOLATE, FERRITIN, TIBC, IRON, RETICCTPCT in the last 72 hours.  Sepsis Labs: Recent Labs  Lab 05/07/24 1152  05/07/24 1200 05/07/24 1403 05/09/24 0426 05/10/24 0637  WBC 11.6*  --   --  12.1* 10.4  LATICACIDVEN  --  0.7 0.9  --   --    Microbiology Recent Results (from the past 240 hours)  Resp panel by RT-PCR (RSV, Flu A&B, Covid) Anterior Nasal Swab     Status: None   Collection Time: 05/07/24 11:21 AM   Specimen: Anterior Nasal Swab  Result Value Ref Range Status   SARS Coronavirus 2 by RT PCR NEGATIVE NEGATIVE Final    Comment: (NOTE) SARS-CoV-2 target nucleic acids are NOT DETECTED.  The SARS-CoV-2 RNA is generally detectable in upper respiratory specimens during the acute phase of infection. The lowest concentration of SARS-CoV-2 viral copies this assay can detect is 138 copies/mL. A negative result does not preclude SARS-Cov-2 infection and should not be used as the sole basis for treatment or other patient management decisions. A negative result may occur with  improper specimen collection/handling, submission of specimen other than nasopharyngeal  swab, presence of viral mutation(s) within the areas targeted by this assay, and inadequate number of viral copies(<138 copies/mL). A negative result must be combined with clinical observations, patient history, and epidemiological information. The expected result is Negative.  Fact Sheet for Patients:  BloggerCourse.com  Fact Sheet for Healthcare Providers:  SeriousBroker.it  This test is no t yet approved or cleared by the [place]  FDA and  has been authorized for detection and/or diagnosis of SARS-CoV-2 by FDA under an Emergency Use Authorization (EUA). This EUA will remain  in effect (meaning this test can be used) for the duration of the COVID-19 declaration under Section 564(b)(1) of the Act, 21 U.S.C.section 360bbb-3(b)(1), unless the authorization is terminated  or revoked sooner.       Influenza A by PCR NEGATIVE NEGATIVE Final   Influenza B by PCR NEGATIVE  NEGATIVE Final    Comment: (NOTE) The Xpert Xpress SARS-CoV-2/FLU/RSV plus assay is intended as an aid in the diagnosis of influenza from Nasopharyngeal swab specimens and should not be used as a sole basis for treatment. Nasal washings and aspirates are unacceptable for Xpert Xpress SARS-CoV-2/FLU/RSV testing.  Fact Sheet for Patients: BloggerCourse.com  Fact Sheet for Healthcare Providers: SeriousBroker.it  This test is not yet approved or cleared by the [place]  FDA and has been authorized for detection and/or diagnosis of SARS-CoV-2 by FDA under an Emergency Use Authorization (EUA). This EUA will remain in effect (meaning this test can be used) for the duration of the COVID-19 declaration under Section 564(b)(1) of the Act, 21 U.S.C. section 360bbb-3(b)(1), unless the authorization is terminated or revoked.     Resp Syncytial Virus by PCR NEGATIVE NEGATIVE Final    Comment: (NOTE) Fact Sheet for Patients: BloggerCourse.com  Fact Sheet for Healthcare Providers: SeriousBroker.it  This test is not yet approved or cleared by the [place]  FDA and has been authorized for detection and/or diagnosis of SARS-CoV-2 by FDA under an Emergency Use Authorization (EUA). This EUA will remain in effect (meaning this test can be used) for the duration of the COVID-19 declaration under Section 564(b)(1) of the Act, 21 U.S.C. section 360bbb-3(b)(1), unless the authorization is terminated or revoked.  Performed at [facility], [address] [place], [place] [address]   Urine Culture     Status: Abnormal   Collection Time: 05/07/24 11:21 AM   Specimen: Urine, Random  Result Value Ref Range Status   Specimen Description   Final    URINE, RANDOM Performed at [facility], [address], [place], [place] [address]    Special Requests   Final    NONE Reflexed from  [id number] Performed at [facility], [address], [place], [place] [address]    Culture >=100,000 COLONIES/mL ENTEROCOCCUS FAECALIS (A)  Final   Report Status 05/09/2024 FINAL  Final   Organism ID, Bacteria ENTEROCOCCUS FAECALIS (A)  Final      Susceptibility   Enterococcus faecalis - MIC*    AMPICILLIN  <=2 SENSITIVE Sensitive     NITROFURANTOIN <=16 SENSITIVE Sensitive     VANCOMYCIN  1 SENSITIVE Sensitive     * >=100,000 COLONIES/mL ENTEROCOCCUS FAECALIS  Blood Culture (routine x 2)     Status: None (Preliminary result)   Collection Time: 05/07/24 11:52 AM   Specimen: BLOOD  Result Value Ref Range Status   Specimen Description BLOOD BLOOD LEFT ARM  Final   Special Requests   Final    BOTTLES DRAWN AEROBIC AND ANAEROBIC Blood Culture adequate volume   Culture  Final    NO GROWTH 4 DAYS Performed at [facility], [address], [place], [place] [address]    Report Status PENDING  Incomplete  Blood Culture (routine x 2)     Status: None (Preliminary result)   Collection Time: 05/07/24 12:00 PM   Specimen: BLOOD  Result Value Ref Range Status   Specimen Description BLOOD BLOOD RIGHT ARM  Final   Special Requests   Final    BOTTLES DRAWN AEROBIC AND ANAEROBIC Blood Culture adequate volume   Culture   Final    NO GROWTH 4 DAYS Performed at [facility], [address], [place], [place] [address]    Report Status PENDING  Incomplete     Medications:    amLODipine   10 mg Oral Daily   brimonidine   1 drop Both Eyes BID   And   timolol   1 drop Both Eyes BID   citalopram   10 mg Oral Daily   cyanocobalamin   1,000 mcg Oral Daily   dorzolamide   1 drop Both Eyes BID   feeding supplement (NEPRO CARB STEADY)  237 mL Oral TID BM   gabapentin   300 mg Oral BID   labetalol   100 mg Oral BID   liver oil-zinc  oxide  1 Application Topical BID   multivitamin with minerals  1 tablet Oral Daily   nutrition supplement (JUVEN)  1 packet Oral BID BM   pantoprazole   40 mg Oral Daily    spironolactone   25 mg Oral BID   thiamine   100 mg Oral Daily   traZODone   50 mg Oral QHS   Continuous Infusions:  cefTRIAXone  (ROCEPHIN )  IV 2 g (05/10/24 1727)   And   metronidazole  500 mg (05/10/24 1812)   And   linezolid  (ZYVOX ) IV 600 mg (05/10/24 2159)      LOS: 4 days   Erle Odell Castor  [facility]  05/11/2024, 8:08 AM

## 2024-05-12 DIAGNOSIS — I96 Gangrene, not elsewhere classified: Secondary | ICD-10-CM | POA: Diagnosis not present

## 2024-05-12 LAB — CULTURE, BLOOD (ROUTINE X 2)
Culture: NO GROWTH
Culture: NO GROWTH
Special Requests: ADEQUATE
Special Requests: ADEQUATE

## 2024-05-12 MED ORDER — AMOXICILLIN-POT CLAVULANATE 875-125 MG PO TABS
1.0000 | ORAL_TABLET | Freq: Two times a day (BID) | ORAL | Status: DC
  Administered 2024-05-12 – 2024-05-15 (×8): 1 via ORAL
  Filled 2024-05-12 (×8): qty 1

## 2024-05-12 NOTE — Plan of Care (Signed)

## 2024-05-12 NOTE — Progress Notes (Signed)
 [facility] PROGRESS NOTE    Progress Note  Brenda Kerr  FMW:[id number] DOB: 04-Jun-1941 DOA: 05/07/2024 PCP: Benjamine Aland, MD     Brief Narrative:   Brenda Kerr is an 83 y.o. female past medical history of hypertension, venous insufficiency, the patient has been bedbound for the past 2 years and continues to lose weight and failure to thrive family went in to her house and discovered malodorous smell admitted for gangrenous right and left feet  Assessment/Plan:   Gangrene of lower extremity (HCC) Started empirically on antibiotics orthopedic surgery was consulted and family would like to proceed with conservative management.  She is not a good candidate for surgical intervention. If the patient becomes septic and unstable will require an above-the-knee amputation. Continue dressing changes. Transition to oral Augmentin  and continue oral linezolid  Palliative care was consulted will meet with family to address goals of care. Patient has been deteriorating over the last several months with poor appetite and losing weight. She has a very poor prognosis, family has unrealistic expectations  Unstagable Sacral decubitus ulcer: Noted.  Possible UTI: Urine culture grew Proteus sensitive to ampicillin .  Hypokalemia: Repleted now improved  Chronic venous insufficiency: Wound care was consulted.  Essential hypertension: Continue amlodipine  Aldactone  and labetalol .  Normocytic anemia: Hemoglobin has remained relatively stable.  Chronic  Diastolic CHF (HCC) Continue labetalol  and Aldactone .  Severe protein caloric malnutrition: Nutrition has been consulted. Her prealbumin is 5.   DVT prophylaxis: lovenox  Family Communication:Daughter Status is: Inpatient Remains inpatient appropriate because: Gangrene of lower extremities    Code Status:     Code Status Orders  (From admission, onward)           Start     Ordered   05/07/24 1647  Do not attempt  resuscitation (DNR)- Limited -Do Not Intubate (DNI)  Continuous       Question Answer Comment  If pulseless and not breathing No CPR or chest compressions.   In Pre-Arrest Conditions (Patient Is Breathing and Has A Pulse) Do not intubate. Provide all appropriate non-invasive medical interventions. Avoid ICU transfer unless indicated or required.   Consent: Discussion documented in EHR or advanced directives reviewed      05/07/24 1652           Code Status History     Date Active Date Inactive Code Status Order ID Comments User Context   06/30/2019 2144 07/11/2019 0023 Full Code [id number]  Cheryle Debby LABOR, MD Inpatient   01/31/2016 0342 02/02/2016 1304 Full Code [id number]  Geralynn Charleston, MD Inpatient   01/28/2016 0037 01/30/2016 1709 Full Code [id number]  Lonzell Emeline HERO, DO ED   05/29/2014 0843 05/29/2014 1412 Full Code [id number]  Ladona Heinz, MD Inpatient         IV Access:   Peripheral IV   Procedures and diagnostic studies:   No results found.   Medical Consultants:   None.   Subjective:    Brenda Kerr nonverbal this morning  Objective:    Vitals:   05/11/24 1206 05/11/24 2140 05/12/24 0042 05/12/24 0413  BP: (!) 116/49 (!) 120/46 111/63 (!) 110/53  Pulse: 66 72 66 72  Resp: 15     Temp: (!) 97.5 F (36.4 C) 97.7 F (36.5 C) 97.9 F (36.6 C) 98 F (36.7 C)  TempSrc:      SpO2: 100% 99% 100% 100%  Weight:       SpO2: 100 %   Intake/Output Summary (Last 24 hours) at 05/12/2024 9275  Last data filed at 05/12/2024 0500 Gross per 24 hour  Intake 200 ml  Output 150 ml  Net 50 ml   Filed Weights   05/07/24 1200  Weight: 47.6 kg    Exam: General exam: In no acute distress. Respiratory system: Good air movement and clear to auscultation. Cardiovascular system: S1 & S2 heard, RRR. No JVD. Gastrointestinal system: Abdomen is nondistended, soft and nontender.  Extremities: Both of her lower extremities are gangrenous Skin: No rashes, lesions or  ulcers Psychiatry: Judgement and insight appear normal. Mood & affect appropriate.  Data Reviewed:    Labs: Basic Metabolic Panel: Recent Labs  Lab 05/07/24 1152 05/09/24 0426 05/10/24 0637  NA 137 135 137  K 3.3* 5.0 4.6  CL 102 106 102  CO2 27 18* 25  GLUCOSE 93 109* 98  BUN 9 7* <5*  CREATININE 0.52 0.55 0.51  CALCIUM 8.5* 8.5* 8.7*   GFR CrCl cannot be calculated (Unknown ideal weight.). Liver Function Tests: Recent Labs  Lab 05/07/24 1152  AST 13*  ALT 10  ALKPHOS 72  BILITOT 0.7  PROT 6.5  ALBUMIN  2.4*   No results for input(s): LIPASE, AMYLASE in the last 168 hours. No results for input(s): AMMONIA in the last 168 hours. Coagulation profile Recent Labs  Lab 05/07/24 1152  INR 1.1   COVID-19 Labs  No results for input(s): DDIMER, FERRITIN, LDH, CRP in the last 72 hours.   Lab Results  Component Value Date   SARSCOV2NAA NEGATIVE 05/07/2024   SARSCOV2NAA NEGATIVE 06/30/2019    CBC: Recent Labs  Lab 05/07/24 1152 05/09/24 0426 05/10/24 0637  WBC 11.6* 12.1* 10.4  NEUTROABS 9.9* 10.0* 8.5*  HGB 9.9* 9.9* 9.6*  HCT 30.9* 30.3* 29.2*  MCV 88.5 86.1 85.1  PLT 315 297 298   Cardiac Enzymes: No results for input(s): CKTOTAL, CKMB, CKMBINDEX, TROPONINI in the last 168 hours. BNP (last 3 results) No results for input(s): PROBNP in the last 8760 hours. CBG: No results for input(s): GLUCAP in the last 168 hours. D-Dimer: No results for input(s): DDIMER in the last 72 hours. Hgb A1c: No results for input(s): HGBA1C in the last 72 hours.  Lipid Profile: No results for input(s): CHOL, HDL, LDLCALC, TRIG, CHOLHDL, LDLDIRECT in the last 72 hours. Thyroid  function studies: No results for input(s): TSH, T4TOTAL, T3FREE, THYROIDAB in the last 72 hours.  Invalid input(s): FREET3 Anemia work up: No results for input(s): VITAMINB12, FOLATE, FERRITIN, TIBC, IRON, RETICCTPCT in the last 72  hours.  Sepsis Labs: Recent Labs  Lab 05/07/24 1152 05/07/24 1200 05/07/24 1403 05/09/24 0426 05/10/24 0637  WBC 11.6*  --   --  12.1* 10.4  LATICACIDVEN  --  0.7 0.9  --   --    Microbiology Recent Results (from the past 240 hours)  Resp panel by RT-PCR (RSV, Flu A&B, Covid) Anterior Nasal Swab     Status: None   Collection Time: 05/07/24 11:21 AM   Specimen: Anterior Nasal Swab  Result Value Ref Range Status   SARS Coronavirus 2 by RT PCR NEGATIVE NEGATIVE Final    Comment: (NOTE) SARS-CoV-2 target nucleic acids are NOT DETECTED.  The SARS-CoV-2 RNA is generally detectable in upper respiratory specimens during the acute phase of infection. The lowest concentration of SARS-CoV-2 viral copies this assay can detect is 138 copies/mL. A negative result does not preclude SARS-Cov-2 infection and should not be used as the sole basis for treatment or other patient management decisions. A negative result may occur  with  improper specimen collection/handling, submission of specimen other than nasopharyngeal swab, presence of viral mutation(s) within the areas targeted by this assay, and inadequate number of viral copies(<138 copies/mL). A negative result must be combined with clinical observations, patient history, and epidemiological information. The expected result is Negative.  Fact Sheet for Patients:  BloggerCourse.com  Fact Sheet for Healthcare Providers:  SeriousBroker.it  This test is no t yet approved or cleared by the [place]  FDA and  has been authorized for detection and/or diagnosis of SARS-CoV-2 by FDA under an Emergency Use Authorization (EUA). This EUA will remain  in effect (meaning this test can be used) for the duration of the COVID-19 declaration under Section 564(b)(1) of the Act, 21 U.S.C.section 360bbb-3(b)(1), unless the authorization is terminated  or revoked sooner.       Influenza A by PCR  NEGATIVE NEGATIVE Final   Influenza B by PCR NEGATIVE NEGATIVE Final    Comment: (NOTE) The Xpert Xpress SARS-CoV-2/FLU/RSV plus assay is intended as an aid in the diagnosis of influenza from Nasopharyngeal swab specimens and should not be used as a sole basis for treatment. Nasal washings and aspirates are unacceptable for Xpert Xpress SARS-CoV-2/FLU/RSV testing.  Fact Sheet for Patients: BloggerCourse.com  Fact Sheet for Healthcare Providers: SeriousBroker.it  This test is not yet approved or cleared by the [place]  FDA and has been authorized for detection and/or diagnosis of SARS-CoV-2 by FDA under an Emergency Use Authorization (EUA). This EUA will remain in effect (meaning this test can be used) for the duration of the COVID-19 declaration under Section 564(b)(1) of the Act, 21 U.S.C. section 360bbb-3(b)(1), unless the authorization is terminated or revoked.     Resp Syncytial Virus by PCR NEGATIVE NEGATIVE Final    Comment: (NOTE) Fact Sheet for Patients: BloggerCourse.com  Fact Sheet for Healthcare Providers: SeriousBroker.it  This test is not yet approved or cleared by the [place]  FDA and has been authorized for detection and/or diagnosis of SARS-CoV-2 by FDA under an Emergency Use Authorization (EUA). This EUA will remain in effect (meaning this test can be used) for the duration of the COVID-19 declaration under Section 564(b)(1) of the Act, 21 U.S.C. section 360bbb-3(b)(1), unless the authorization is terminated or revoked.  Performed at [facility], [address] [place], [place] [address]   Urine Culture     Status: Abnormal   Collection Time: 05/07/24 11:21 AM   Specimen: Urine, Random  Result Value Ref Range Status   Specimen Description   Final    URINE, RANDOM Performed at [facility], [address] [place], [place] [address]    Special  Requests   Final    NONE Reflexed from [id number] Performed at [facility], [address], [place], [place] [address]    Culture >=100,000 COLONIES/mL ENTEROCOCCUS FAECALIS (A)  Final   Report Status 05/09/2024 FINAL  Final   Organism ID, Bacteria ENTEROCOCCUS FAECALIS (A)  Final      Susceptibility   Enterococcus faecalis - MIC*    AMPICILLIN  <=2 SENSITIVE Sensitive     NITROFURANTOIN <=16 SENSITIVE Sensitive     VANCOMYCIN  1 SENSITIVE Sensitive     * >=100,000 COLONIES/mL ENTEROCOCCUS FAECALIS  Blood Culture (routine x 2)     Status: None (Preliminary result)   Collection Time: 05/07/24 11:52 AM   Specimen: BLOOD  Result Value Ref Range Status   Specimen Description BLOOD BLOOD LEFT ARM  Final   Special Requests   Final    BOTTLES DRAWN  AEROBIC AND ANAEROBIC Blood Culture adequate volume   Culture   Final    NO GROWTH 4 DAYS Performed at [facility], [address] [place], [place] [address]    Report Status PENDING  Incomplete  Blood Culture (routine x 2)     Status: None (Preliminary result)   Collection Time: 05/07/24 12:00 PM   Specimen: BLOOD  Result Value Ref Range Status   Specimen Description BLOOD BLOOD RIGHT ARM  Final   Special Requests   Final    BOTTLES DRAWN AEROBIC AND ANAEROBIC Blood Culture adequate volume   Culture   Final    NO GROWTH 4 DAYS Performed at [facility], [address] [place], [place] [address]    Report Status PENDING  Incomplete     Medications:    amLODipine   10 mg Oral Daily   brimonidine   1 drop Both Eyes BID   And   timolol   1 drop Both Eyes BID   citalopram   10 mg Oral Daily   cyanocobalamin   1,000 mcg Oral Daily   dorzolamide   1 drop Both Eyes BID   feeding supplement (NEPRO CARB STEADY)  237 mL Oral TID BM   gabapentin   300 mg Oral BID   labetalol   100 mg Oral BID   linezolid   600 mg Oral Q12H   liver oil-zinc  oxide  1 Application Topical BID   metroNIDAZOLE   500 mg Oral Q12H   multivitamin with minerals  1 tablet Oral  Daily   nutrition supplement (JUVEN)  1 packet Oral BID BM   pantoprazole   40 mg Oral Daily   spironolactone   25 mg Oral BID   thiamine   100 mg Oral Daily   traZODone   50 mg Oral QHS   Continuous Infusions:  cefTRIAXone  (ROCEPHIN )  IV 2 g (05/11/24 1808)      LOS: 5 days   Erle Odell Castor  [facility]  05/12/2024, 7:24 AM

## 2024-05-13 DIAGNOSIS — Z66 Do not resuscitate: Secondary | ICD-10-CM | POA: Diagnosis not present

## 2024-05-13 DIAGNOSIS — R627 Adult failure to thrive: Secondary | ICD-10-CM

## 2024-05-13 DIAGNOSIS — I96 Gangrene, not elsewhere classified: Secondary | ICD-10-CM | POA: Diagnosis not present

## 2024-05-13 DIAGNOSIS — E43 Unspecified severe protein-calorie malnutrition: Secondary | ICD-10-CM | POA: Diagnosis not present

## 2024-05-13 DIAGNOSIS — Z7189 Other specified counseling: Secondary | ICD-10-CM | POA: Diagnosis not present

## 2024-05-13 DIAGNOSIS — Z515 Encounter for palliative care: Secondary | ICD-10-CM | POA: Diagnosis not present

## 2024-05-13 NOTE — Plan of Care (Signed)

## 2024-05-13 NOTE — Progress Notes (Signed)
                                                                                                                                                                                                          Palliative Medicine Progress Note   Patient Name: Brenda Kerr       Date: 05/13/2024 DOB: 02/27/1941  Age: 83 y.o. MRN#: [id number] Attending Physician: Odell Celinda Balo, MD Primary Care Physician: Benjamine Aland, MD Admit Date: 05/07/2024   HPI/Patient Profile: 83 y.o. female with past medical history of HTN, venous insufficiency admitted with gangrenous right and left feet. Patient has been bedbound for the proximately 2 years and continues to lose weight and have generalized failure to thrive. Family brought her to the ED when they discovered a malodorous wound in her feet. She was admitted on 05/07/2024 with gangrene of the lower extremities, sacral decubitus ulcer, hypokalemia, venous stasis ulcers of bilateral feet, diastolic CHF, severe protein calorie malnutrition.   Subjective: Chart reviewed.  Updates received.  Patient assessed.   Objective:  Physical Exam Vitals reviewed.  Constitutional:      General: She is awake. She is not in acute distress.    Appearance: She is cachectic. She is ill-appearing.  Pulmonary:     Effort: Pulmonary effort is normal.     Comments: On room air Neurological:     Mental Status: She is confused.              LBM: Last BM Date : 05/10/24     Palliative Assessment/Data: ***     Palliative Medicine Assessment & Plan   Assessment: Principal Problem:   Gangrene of lower extremity (HCC) Active Problems:   Diastolic CHF (HCC)   Anemia   Essential hypertension   Venous stasis ulcers of both lower extremities (HCC)   Hypokalemia   Sacral decubitus ulcer   Protein-calorie malnutrition, severe (HCC)    Recommendations/Plan: DNR with limited interventions  Goals of Care and Additional Recommendations: Limitations on Scope of  Treatment: {Recommended Scope and Preferences:21019}  Code Status:   Prognosis:  {Palliative Care Prognosis:23504}  Discharge Planning: {Palliative dispostion:23505}  Care plan was discussed with ***  Thank you for allowing the Palliative Medicine Team to assist in the care of this patient.   ***   Recardo KATHEE Loll, NP   Please contact Palliative Medicine Team phone at [phone] for questions and concerns.  For individual providers, please see AMION.

## 2024-05-13 NOTE — Progress Notes (Signed)
 [facility] PROGRESS NOTE    Progress Note  DAREN Kerr  FMW:[id number] DOB: 1940/11/07 DOA: 05/07/2024 PCP: Benjamine Aland, MD     Brief Narrative:   Brenda Kerr is an 83 y.o. female past medical history of hypertension, venous insufficiency, the patient has been bedbound for the past 2 years and continues to lose weight and failure to thrive family went in to her house and discovered malodorous smell admitted for gangrenous right and left feet  Assessment/Plan:   Gangrene of lower extremity (HCC) Started empirically on antibiotics orthopedic surgery was consulted, she is not a good candidate for surgical intervention. If the patient becomes septic and unstable will require an above-the-knee amputation. Continue dressing changes. Transition to oral Augmentin  and continue oral linezolid  Palliative care was consulted will meet with family to address goals of care. Patient has been deteriorating over the last several months with poor appetite and losing weight. She has a very poor prognosis, family has unrealistic expectations  Unstagable Sacral decubitus ulcer: Noted.  Possible UTI: Urine culture grew Proteus sensitive to ampicillin .  Hypokalemia: Repleted now improved  Chronic venous insufficiency: Wound care was consulted.  Essential hypertension: Continue amlodipine  Aldactone  and labetalol .  Normocytic anemia: Hemoglobin has remained relatively stable.  Chronic  Diastolic CHF (HCC) Continue labetalol  and Aldactone .  Severe protein caloric malnutrition: Nutrition has been consulted. Her prealbumin is 5.   DVT prophylaxis: lovenox  Family Communication:Daughter Status is: Inpatient Remains inpatient appropriate because: Gangrene of lower extremities    Code Status:     Code Status Orders  (From admission, onward)           Start     Ordered   05/07/24 1647  Do not attempt resuscitation (DNR)- Limited -Do Not Intubate (DNI)  Continuous        Question Answer Comment  If pulseless and not breathing No CPR or chest compressions.   In Pre-Arrest Conditions (Patient Is Breathing and Has A Pulse) Do not intubate. Provide all appropriate non-invasive medical interventions. Avoid ICU transfer unless indicated or required.   Consent: Discussion documented in EHR or advanced directives reviewed      05/07/24 1652           Code Status History     Date Active Date Inactive Code Status Order ID Comments User Context   06/30/2019 2144 07/11/2019 0023 Full Code [id number]  Cheryle Debby LABOR, MD Inpatient   01/31/2016 0342 02/02/2016 1304 Full Code [id number]  Geralynn Charleston, MD Inpatient   01/28/2016 0037 01/30/2016 1709 Full Code [id number]  Lonzell Emeline HERO, DO ED   05/29/2014 0843 05/29/2014 1412 Full Code [id number]  Ladona Heinz, MD Inpatient         IV Access:   Peripheral IV   Procedures and diagnostic studies:   No results found.   Medical Consultants:   None.   Subjective:    Brenda Kerr nonverbal this morning  Objective:    Vitals:   05/13/24 0033 05/13/24 0450 05/13/24 0500 05/13/24 0816  BP: 106/72 92/76 105/65 (!) 94/55  Pulse: 64 66 68 67  Resp:  17 18   Temp: 98.1 F (36.7 C) 97.8 F (36.6 C) 97.8 F (36.6 C)   TempSrc: Oral Oral    SpO2: 100% 100% 99%   Weight:       SpO2: 99 %   Intake/Output Summary (Last 24 hours) at 05/13/2024 9167 Last data filed at 05/13/2024 0452 Gross per 24 hour  Intake --  Output 500  ml  Net -500 ml   Filed Weights   05/07/24 1200  Weight: 47.6 kg    Exam: General exam: In no acute distress. Respiratory system: Good air movement and clear to auscultation. Cardiovascular system: S1 & S2 heard, RRR. No JVD. Gastrointestinal system: Abdomen is nondistended, soft and nontender.  Extremities: Both of her lower extremities are gangrenous Skin: No rashes, lesions or ulcers Psychiatry: Judgement and insight appear normal. Mood & affect appropriate.  Data  Reviewed:    Labs: Basic Metabolic Panel: Recent Labs  Lab 05/07/24 1152 05/09/24 0426 05/10/24 0637  NA 137 135 137  K 3.3* 5.0 4.6  CL 102 106 102  CO2 27 18* 25  GLUCOSE 93 109* 98  BUN 9 7* <5*  CREATININE 0.52 0.55 0.51  CALCIUM 8.5* 8.5* 8.7*   GFR CrCl cannot be calculated (Unknown ideal weight.). Liver Function Tests: Recent Labs  Lab 05/07/24 1152  AST 13*  ALT 10  ALKPHOS 72  BILITOT 0.7  PROT 6.5  ALBUMIN  2.4*   No results for input(s): LIPASE, AMYLASE in the last 168 hours. No results for input(s): AMMONIA in the last 168 hours. Coagulation profile Recent Labs  Lab 05/07/24 1152  INR 1.1   COVID-19 Labs  No results for input(s): DDIMER, FERRITIN, LDH, CRP in the last 72 hours.   Lab Results  Component Value Date   SARSCOV2NAA NEGATIVE 05/07/2024   SARSCOV2NAA NEGATIVE 06/30/2019    CBC: Recent Labs  Lab 05/07/24 1152 05/09/24 0426 05/10/24 0637  WBC 11.6* 12.1* 10.4  NEUTROABS 9.9* 10.0* 8.5*  HGB 9.9* 9.9* 9.6*  HCT 30.9* 30.3* 29.2*  MCV 88.5 86.1 85.1  PLT 315 297 298   Cardiac Enzymes: No results for input(s): CKTOTAL, CKMB, CKMBINDEX, TROPONINI in the last 168 hours. BNP (last 3 results) No results for input(s): PROBNP in the last 8760 hours. CBG: No results for input(s): GLUCAP in the last 168 hours. D-Dimer: No results for input(s): DDIMER in the last 72 hours. Hgb A1c: No results for input(s): HGBA1C in the last 72 hours.  Lipid Profile: No results for input(s): CHOL, HDL, LDLCALC, TRIG, CHOLHDL, LDLDIRECT in the last 72 hours. Thyroid  function studies: No results for input(s): TSH, T4TOTAL, T3FREE, THYROIDAB in the last 72 hours.  Invalid input(s): FREET3 Anemia work up: No results for input(s): VITAMINB12, FOLATE, FERRITIN, TIBC, IRON, RETICCTPCT in the last 72 hours.  Sepsis Labs: Recent Labs  Lab 05/07/24 1152 05/07/24 1200 05/07/24 1403  05/09/24 0426 05/10/24 0637  WBC 11.6*  --   --  12.1* 10.4  LATICACIDVEN  --  0.7 0.9  --   --    Microbiology Recent Results (from the past 240 hours)  Resp panel by RT-PCR (RSV, Flu A&B, Covid) Anterior Nasal Swab     Status: None   Collection Time: 05/07/24 11:21 AM   Specimen: Anterior Nasal Swab  Result Value Ref Range Status   SARS Coronavirus 2 by RT PCR NEGATIVE NEGATIVE Final    Comment: (NOTE) SARS-CoV-2 target nucleic acids are NOT DETECTED.  The SARS-CoV-2 RNA is generally detectable in upper respiratory specimens during the acute phase of infection. The lowest concentration of SARS-CoV-2 viral copies this assay can detect is 138 copies/mL. A negative result does not preclude SARS-Cov-2 infection and should not be used as the sole basis for treatment or other patient management decisions. A negative result may occur with  improper specimen collection/handling, submission of specimen other than nasopharyngeal swab, presence of viral mutation(s) within  the areas targeted by this assay, and inadequate number of viral copies(<138 copies/mL). A negative result must be combined with clinical observations, patient history, and epidemiological information. The expected result is Negative.  Fact Sheet for Patients:  BloggerCourse.com  Fact Sheet for Healthcare Providers:  SeriousBroker.it  This test is no t yet approved or cleared by the [place]  FDA and  has been authorized for detection and/or diagnosis of SARS-CoV-2 by FDA under an Emergency Use Authorization (EUA). This EUA will remain  in effect (meaning this test can be used) for the duration of the COVID-19 declaration under Section 564(b)(1) of the Act, 21 U.S.C.section 360bbb-3(b)(1), unless the authorization is terminated  or revoked sooner.       Influenza A by PCR NEGATIVE NEGATIVE Final   Influenza B by PCR NEGATIVE NEGATIVE Final    Comment:  (NOTE) The Xpert Xpress SARS-CoV-2/FLU/RSV plus assay is intended as an aid in the diagnosis of influenza from Nasopharyngeal swab specimens and should not be used as a sole basis for treatment. Nasal washings and aspirates are unacceptable for Xpert Xpress SARS-CoV-2/FLU/RSV testing.  Fact Sheet for Patients: BloggerCourse.com  Fact Sheet for Healthcare Providers: SeriousBroker.it  This test is not yet approved or cleared by the [place]  FDA and has been authorized for detection and/or diagnosis of SARS-CoV-2 by FDA under an Emergency Use Authorization (EUA). This EUA will remain in effect (meaning this test can be used) for the duration of the COVID-19 declaration under Section 564(b)(1) of the Act, 21 U.S.C. section 360bbb-3(b)(1), unless the authorization is terminated or revoked.     Resp Syncytial Virus by PCR NEGATIVE NEGATIVE Final    Comment: (NOTE) Fact Sheet for Patients: BloggerCourse.com  Fact Sheet for Healthcare Providers: SeriousBroker.it  This test is not yet approved or cleared by the [place]  FDA and has been authorized for detection and/or diagnosis of SARS-CoV-2 by FDA under an Emergency Use Authorization (EUA). This EUA will remain in effect (meaning this test can be used) for the duration of the COVID-19 declaration under Section 564(b)(1) of the Act, 21 U.S.C. section 360bbb-3(b)(1), unless the authorization is terminated or revoked.  Performed at [facility], [address] [place], [place] [address]   Urine Culture     Status: Abnormal   Collection Time: 05/07/24 11:21 AM   Specimen: Urine, Random  Result Value Ref Range Status   Specimen Description   Final    URINE, RANDOM Performed at [facility], [address], [place], [place] [address]    Special Requests   Final    NONE Reflexed from [id number] Performed at [facility],  [address] [place], [place] [address]    Culture >=100,000 COLONIES/mL ENTEROCOCCUS FAECALIS (A)  Final   Report Status 05/09/2024 FINAL  Final   Organism ID, Bacteria ENTEROCOCCUS FAECALIS (A)  Final      Susceptibility   Enterococcus faecalis - MIC*    AMPICILLIN  <=2 SENSITIVE Sensitive     NITROFURANTOIN <=16 SENSITIVE Sensitive     VANCOMYCIN  1 SENSITIVE Sensitive     * >=100,000 COLONIES/mL ENTEROCOCCUS FAECALIS  Blood Culture (routine x 2)     Status: None   Collection Time: 05/07/24 11:52 AM   Specimen: BLOOD  Result Value Ref Range Status   Specimen Description BLOOD BLOOD LEFT ARM  Final   Special Requests   Final    BOTTLES DRAWN AEROBIC AND ANAEROBIC Blood Culture adequate volume   Culture   Final    NO GROWTH 5  DAYS Performed at [facility], [address], [place], [place] [address]    Report Status 05/12/2024 FINAL  Final  Blood Culture (routine x 2)     Status: None   Collection Time: 05/07/24 12:00 PM   Specimen: BLOOD  Result Value Ref Range Status   Specimen Description BLOOD BLOOD RIGHT ARM  Final   Special Requests   Final    BOTTLES DRAWN AEROBIC AND ANAEROBIC Blood Culture adequate volume   Culture   Final    NO GROWTH 5 DAYS Performed at [facility], [address], [place], [place] [address]    Report Status 05/12/2024 FINAL  Final     Medications:    amLODipine   10 mg Oral Daily   amoxicillin -clavulanate  1 tablet Oral Q12H   brimonidine   1 drop Both Eyes BID   And   timolol   1 drop Both Eyes BID   citalopram   10 mg Oral Daily   cyanocobalamin   1,000 mcg Oral Daily   dorzolamide   1 drop Both Eyes BID   feeding supplement (NEPRO CARB STEADY)  237 mL Oral TID BM   gabapentin   300 mg Oral BID   labetalol   100 mg Oral BID   linezolid   600 mg Oral Q12H   liver oil-zinc  oxide  1 Application Topical BID   multivitamin with minerals  1 tablet Oral Daily   nutrition supplement (JUVEN)  1 packet Oral BID BM   pantoprazole   40 mg Oral Daily    spironolactone   25 mg Oral BID   thiamine   100 mg Oral Daily   traZODone   50 mg Oral QHS   Continuous Infusions:      LOS: 6 days   Erle Odell Castor  [facility]  05/13/2024, 8:32 AM

## 2024-05-14 DIAGNOSIS — I96 Gangrene, not elsewhere classified: Secondary | ICD-10-CM | POA: Diagnosis not present

## 2024-05-14 MED ORDER — FENTANYL CITRATE PF 50 MCG/ML IJ SOSY
12.5000 ug | PREFILLED_SYRINGE | INTRAMUSCULAR | Status: DC | PRN
  Administered 2024-05-14: 50 ug via INTRAVENOUS
  Filled 2024-05-14: qty 1

## 2024-05-14 NOTE — Plan of Care (Signed)

## 2024-05-14 NOTE — Progress Notes (Signed)
 [facility] PROGRESS NOTE    Progress Note  Brenda Kerr  FMW:[id number] DOB: 17-Dec-1940 DOA: 05/07/2024 PCP: Benjamine Aland, MD     Brief Narrative:   Brenda Kerr is an 83 y.o. female past medical history of hypertension, venous insufficiency, the patient has been bedbound for the past 2 years and continues to lose weight and failure to thrive family went in to her house and discovered malodorous smell admitted for gangrenous right and left feet  Assessment/Plan:   Gangrene of lower extremity (HCC) Started empirically on antibiotics orthopedic surgery was consulted, she is not a good candidate for surgical intervention. If the patient becomes septic and unstable will require an above-the-knee amputation. Continue dressing changes. Transition to oral Augmentin  and continue oral linezolid  Palliative care was consulted will meet with family to address goals of care on 05/15/2024 Patient has been deteriorating over the last several months with poor appetite and losing weight. She has a very poor prognosis, family has unrealistic expectations  Unstagable Sacral decubitus ulcer: Noted.  Possible UTI: Urine culture grew Proteus sensitive to ampicillin .  Hypokalemia: Repleted now improved  Chronic venous insufficiency: Wound care was consulted.  Essential hypertension: Continue amlodipine  Aldactone  and labetalol .  Normocytic anemia: Hemoglobin has remained relatively stable.  Chronic  Diastolic CHF (HCC) Continue labetalol  and Aldactone .  Severe protein caloric malnutrition: Nutrition has been consulted. Her prealbumin is 5.   DVT prophylaxis: lovenox  Family Communication:Daughter Status is: Inpatient Remains inpatient appropriate because: Gangrene of lower extremities    Code Status:     Code Status Orders  (From admission, onward)           Start     Ordered   05/07/24 1647  Do not attempt resuscitation (DNR)- Limited -Do Not Intubate (DNI)   Continuous       Question Answer Comment  If pulseless and not breathing No CPR or chest compressions.   In Pre-Arrest Conditions (Patient Is Breathing and Has A Pulse) Do not intubate. Provide all appropriate non-invasive medical interventions. Avoid ICU transfer unless indicated or required.   Consent: Discussion documented in EHR or advanced directives reviewed      05/07/24 1652           Code Status History     Date Active Date Inactive Code Status Order ID Comments User Context   06/30/2019 2144 07/11/2019 0023 Full Code [id number]  Cheryle Debby LABOR, MD Inpatient   01/31/2016 0342 02/02/2016 1304 Full Code [id number]  Geralynn Charleston, MD Inpatient   01/28/2016 0037 01/30/2016 1709 Full Code [id number]  Lonzell Emeline HERO, DO ED   05/29/2014 0843 05/29/2014 1412 Full Code [id number]  Ladona Heinz, MD Inpatient         IV Access:   Peripheral IV   Procedures and diagnostic studies:   No results found.   Medical Consultants:   None.   Subjective:    Brenda Kerr nonverbal this morning  Objective:    Vitals:   05/13/24 1626 05/13/24 1945 05/13/24 2346 05/14/24 0429  BP: (!) 150/94 133/75 118/74 121/73  Pulse: (!) 104 94 83 74  Resp: 16 18 18 18   Temp: 98.1 F (36.7 C) 98.2 F (36.8 C) 98.3 F (36.8 C) 97.6 F (36.4 C)  TempSrc: Oral     SpO2:  100% 100% 99%  Weight:       SpO2: 99 %  No intake or output data in the 24 hours ending 05/14/24 0835  Filed Weights   05/07/24 1200  Weight: 47.6 kg    Exam: General exam: In no acute distress. Respiratory system: Good air movement and clear to auscultation. Cardiovascular system: S1 & S2 heard, RRR. No JVD. Gastrointestinal system: Abdomen is nondistended, soft and nontender.  Extremities: Both of her lower extremities are gangrenous Skin: No rashes, lesions or ulcers Psychiatry: Judgement and insight appear normal. Mood & affect appropriate.  Data Reviewed:    Labs: Basic Metabolic Panel: Recent  Labs  Lab 05/07/24 1152 05/09/24 0426 05/10/24 0637  NA 137 135 137  K 3.3* 5.0 4.6  CL 102 106 102  CO2 27 18* 25  GLUCOSE 93 109* 98  BUN 9 7* <5*  CREATININE 0.52 0.55 0.51  CALCIUM 8.5* 8.5* 8.7*   GFR CrCl cannot be calculated (Unknown ideal weight.). Liver Function Tests: Recent Labs  Lab 05/07/24 1152  AST 13*  ALT 10  ALKPHOS 72  BILITOT 0.7  PROT 6.5  ALBUMIN  2.4*   No results for input(s): LIPASE, AMYLASE in the last 168 hours. No results for input(s): AMMONIA in the last 168 hours. Coagulation profile Recent Labs  Lab 05/07/24 1152  INR 1.1   COVID-19 Labs  No results for input(s): DDIMER, FERRITIN, LDH, CRP in the last 72 hours.   Lab Results  Component Value Date   SARSCOV2NAA NEGATIVE 05/07/2024   SARSCOV2NAA NEGATIVE 06/30/2019    CBC: Recent Labs  Lab 05/07/24 1152 05/09/24 0426 05/10/24 0637  WBC 11.6* 12.1* 10.4  NEUTROABS 9.9* 10.0* 8.5*  HGB 9.9* 9.9* 9.6*  HCT 30.9* 30.3* 29.2*  MCV 88.5 86.1 85.1  PLT 315 297 298   Cardiac Enzymes: No results for input(s): CKTOTAL, CKMB, CKMBINDEX, TROPONINI in the last 168 hours. BNP (last 3 results) No results for input(s): PROBNP in the last 8760 hours. CBG: No results for input(s): GLUCAP in the last 168 hours. D-Dimer: No results for input(s): DDIMER in the last 72 hours. Hgb A1c: No results for input(s): HGBA1C in the last 72 hours.  Lipid Profile: No results for input(s): CHOL, HDL, LDLCALC, TRIG, CHOLHDL, LDLDIRECT in the last 72 hours. Thyroid  function studies: No results for input(s): TSH, T4TOTAL, T3FREE, THYROIDAB in the last 72 hours.  Invalid input(s): FREET3 Anemia work up: No results for input(s): VITAMINB12, FOLATE, FERRITIN, TIBC, IRON, RETICCTPCT in the last 72 hours.  Sepsis Labs: Recent Labs  Lab 05/07/24 1152 05/07/24 1200 05/07/24 1403 05/09/24 0426 05/10/24 0637  WBC 11.6*  --   --  12.1*  10.4  LATICACIDVEN  --  0.7 0.9  --   --    Microbiology Recent Results (from the past 240 hours)  Resp panel by RT-PCR (RSV, Flu A&B, Covid) Anterior Nasal Swab     Status: None   Collection Time: 05/07/24 11:21 AM   Specimen: Anterior Nasal Swab  Result Value Ref Range Status   SARS Coronavirus 2 by RT PCR NEGATIVE NEGATIVE Final    Comment: (NOTE) SARS-CoV-2 target nucleic acids are NOT DETECTED.  The SARS-CoV-2 RNA is generally detectable in upper respiratory specimens during the acute phase of infection. The lowest concentration of SARS-CoV-2 viral copies this assay can detect is 138 copies/mL. A negative result does not preclude SARS-Cov-2 infection and should not be used as the sole basis for treatment or other patient management decisions. A negative result may occur with  improper specimen collection/handling, submission of specimen other than nasopharyngeal swab, presence of viral mutation(s) within the areas targeted by this assay, and inadequate number of viral copies(<138 copies/mL). A  negative result must be combined with clinical observations, patient history, and epidemiological information. The expected result is Negative.  Fact Sheet for Patients:  BloggerCourse.com  Fact Sheet for Healthcare Providers:  SeriousBroker.it  This test is no t yet approved or cleared by the [place]  FDA and  has been authorized for detection and/or diagnosis of SARS-CoV-2 by FDA under an Emergency Use Authorization (EUA). This EUA will remain  in effect (meaning this test can be used) for the duration of the COVID-19 declaration under Section 564(b)(1) of the Act, 21 U.S.C.section 360bbb-3(b)(1), unless the authorization is terminated  or revoked sooner.       Influenza A by PCR NEGATIVE NEGATIVE Final   Influenza B by PCR NEGATIVE NEGATIVE Final    Comment: (NOTE) The Xpert Xpress SARS-CoV-2/FLU/RSV plus assay is  intended as an aid in the diagnosis of influenza from Nasopharyngeal swab specimens and should not be used as a sole basis for treatment. Nasal washings and aspirates are unacceptable for Xpert Xpress SARS-CoV-2/FLU/RSV testing.  Fact Sheet for Patients: BloggerCourse.com  Fact Sheet for Healthcare Providers: SeriousBroker.it  This test is not yet approved or cleared by the [place]  FDA and has been authorized for detection and/or diagnosis of SARS-CoV-2 by FDA under an Emergency Use Authorization (EUA). This EUA will remain in effect (meaning this test can be used) for the duration of the COVID-19 declaration under Section 564(b)(1) of the Act, 21 U.S.C. section 360bbb-3(b)(1), unless the authorization is terminated or revoked.     Resp Syncytial Virus by PCR NEGATIVE NEGATIVE Final    Comment: (NOTE) Fact Sheet for Patients: BloggerCourse.com  Fact Sheet for Healthcare Providers: SeriousBroker.it  This test is not yet approved or cleared by the [place]  FDA and has been authorized for detection and/or diagnosis of SARS-CoV-2 by FDA under an Emergency Use Authorization (EUA). This EUA will remain in effect (meaning this test can be used) for the duration of the COVID-19 declaration under Section 564(b)(1) of the Act, 21 U.S.C. section 360bbb-3(b)(1), unless the authorization is terminated or revoked.  Performed at [facility], [address], [place], [place] [address]   Urine Culture     Status: Abnormal   Collection Time: 05/07/24 11:21 AM   Specimen: Urine, Random  Result Value Ref Range Status   Specimen Description   Final    URINE, RANDOM Performed at [facility], [address], [place], [place] [address]    Special Requests   Final    NONE Reflexed from [id number] Performed at [facility], [address], [place], [place] [address]    Culture >=100,000  COLONIES/mL ENTEROCOCCUS FAECALIS (A)  Final   Report Status 05/09/2024 FINAL  Final   Organism ID, Bacteria ENTEROCOCCUS FAECALIS (A)  Final      Susceptibility   Enterococcus faecalis - MIC*    AMPICILLIN  <=2 SENSITIVE Sensitive     NITROFURANTOIN <=16 SENSITIVE Sensitive     VANCOMYCIN  1 SENSITIVE Sensitive     * >=100,000 COLONIES/mL ENTEROCOCCUS FAECALIS  Blood Culture (routine x 2)     Status: None   Collection Time: 05/07/24 11:52 AM   Specimen: BLOOD  Result Value Ref Range Status   Specimen Description BLOOD BLOOD LEFT ARM  Final   Special Requests   Final    BOTTLES DRAWN AEROBIC AND ANAEROBIC Blood Culture adequate volume   Culture   Final    NO GROWTH 5 DAYS Performed at [facility], [address] [place], [place] [address]  Report Status 05/12/2024 FINAL  Final  Blood Culture (routine x 2)     Status: None   Collection Time: 05/07/24 12:00 PM   Specimen: BLOOD  Result Value Ref Range Status   Specimen Description BLOOD BLOOD RIGHT ARM  Final   Special Requests   Final    BOTTLES DRAWN AEROBIC AND ANAEROBIC Blood Culture adequate volume   Culture   Final    NO GROWTH 5 DAYS Performed at [facility], [address], [place], [place] [address]    Report Status 05/12/2024 FINAL  Final     Medications:    amLODipine   10 mg Oral Daily   amoxicillin -clavulanate  1 tablet Oral Q12H   brimonidine   1 drop Both Eyes BID   And   timolol   1 drop Both Eyes BID   citalopram   10 mg Oral Daily   cyanocobalamin   1,000 mcg Oral Daily   dorzolamide   1 drop Both Eyes BID   feeding supplement (NEPRO CARB STEADY)  237 mL Oral TID BM   gabapentin   300 mg Oral BID   labetalol   100 mg Oral BID   linezolid   600 mg Oral Q12H   liver oil-zinc  oxide  1 Application Topical BID   multivitamin with minerals  1 tablet Oral Daily   nutrition supplement (JUVEN)  1 packet Oral BID BM   pantoprazole   40 mg Oral Daily   spironolactone   25 mg Oral BID   thiamine   100 mg Oral Daily    traZODone   50 mg Oral QHS   Continuous Infusions:      LOS: 7 days   Erle Odell Castor  [facility]  05/14/2024, 8:35 AM

## 2024-05-14 NOTE — TOC Progression Note (Signed)
 Transition of Care [facility]) - Progression Note    Patient Details  Name: Brenda Kerr MRN: [id number] Date of Birth: 1941/01/27  [facility] CM/SW Contact  Gwenn Frieze [place], [place] Phone Number: 05/14/2024, 2:29 PM  Clinical Narrative: Spoke to pt's dtr [name] re hospice services. Dtr reports family would like pt to be reviewed for hospice home placement and hospice services at home. Hospice provider choice offered and dtr is requesting [facility] since family all live in [place]. Referral made to [facility] with [facility]. Will follow.   Frieze Gwenn, MSW, LCSW [phone] (coverage)                          Expected Discharge Plan and Services                                               Social Drivers of Health (SDOH) Interventions SDOH Screenings   Food Insecurity: No Food Insecurity (05/08/2024)  Housing: Low Risk  (05/08/2024)  Transportation Needs: No Transportation Needs (05/08/2024)  Utilities: Not At Risk (05/08/2024)  Depression (PHQ2-9): Low Risk  (12/21/2019)  Social Connections: Moderately Isolated (05/08/2024)  Tobacco Use: Low Risk  (05/07/2024)    Readmission Risk Interventions    05/10/2024    2:15 PM  Readmission Risk Prevention Plan  Transportation Screening Complete  PCP or Specialist Appt within 5-7 Days Complete  Home Care Screening Complete  Medication Review (RN CM) Complete

## 2024-05-14 NOTE — Progress Notes (Signed)
 [facility] [id number] [facility] Liaison Note   Received request for family interest in hospice inpatient unit. Spoke with daughter [name] by phone to discuss services and hospice philosophy of care.    Chart reviewed and at this time patient does not meet criteria for hospice inpatient unit.    She is appropriate for hospice services in the home or LTC facility and we would be happy to reassess for inpatient unit appropriateness at a later time if requested. Discussed what services would like like in the home vs LTC facility. Daughter was appreciative of the information. No definitive decision has been made as of yet for next steps. Provided daughter with phone number, in the event she has further questions or concerns.    Thank you for the opportunity to participate in this patient's care.    Nat Babe, BSN, [facility] Liaison [phone]

## 2024-05-15 ENCOUNTER — Other Ambulatory Visit (HOSPITAL_COMMUNITY): Payer: Self-pay

## 2024-05-15 DIAGNOSIS — I96 Gangrene, not elsewhere classified: Secondary | ICD-10-CM | POA: Diagnosis not present

## 2024-05-15 MED ORDER — AMOXICILLIN-POT CLAVULANATE 875-125 MG PO TABS
1.0000 | ORAL_TABLET | Freq: Two times a day (BID) | ORAL | 0 refills | Status: AC
  Filled 2024-05-15: qty 28, 14d supply, fill #0

## 2024-05-15 NOTE — Plan of Care (Signed)
 Patient calm and cooperative, dressing on sacral/coccyx area changed. Pressure reduction boots on. Patient discharged home. Problem: Education: Goal: Knowledge of General Education information will improve Description: Including pain rating scale, medication(s)/side effects and non-pharmacologic comfort measures Outcome: Progressing   Problem: Health Behavior/Discharge Planning: Goal: Ability to manage health-related needs will improve Outcome: Progressing   Problem: Clinical Measurements: Goal: Ability to maintain clinical measurements within normal limits will improve Outcome: Progressing   Problem: Activity: Goal: Risk for activity intolerance will decrease Outcome: Progressing   Problem: Nutrition: Goal: Adequate nutrition will be maintained Outcome: Progressing   Problem: Coping: Goal: Level of anxiety will decrease Outcome: Progressing

## 2024-05-15 NOTE — Discharge Summary (Signed)
 Physician Discharge Summary  Brenda Kerr FMW:[id number] DOB: [birth year] DOA: 05/07/2024  PCP: Benjamine Aland, MD  Admit date: 05/07/2024 Discharge date: 05/15/2024  Admitted From: Home Disposition:  Home with hospice  Recommendations for Outpatient Follow-up:  Follow up with PCP in 1-2 weeks Please obtain BMP/CBC in one week   Home Health:No Equipment/Devices:None   Discharge Condition:Hospice CODE STATUS:DNR Diet recommendation: Heart Healthy   Brief/Interim Summary: 83 y.o. female past medical history of hypertension, venous insufficiency, the patient has been bedbound for the past 2 years and continues to lose weight and failure to thrive family went in to her house and discovered malodorous smell admitted for gangrenous right and left feet   Discharge Diagnoses:  Principal Problem:   Gangrene of lower extremity (HCC) Active Problems:   Diastolic CHF (HCC)   Anemia   Essential hypertension   Venous stasis ulcers of both lower extremities (HCC)   Hypokalemia   Sacral decubitus ulcer   Protein-calorie malnutrition, severe (HCC)  Gangrene of right lower extremity: Start empirically on antibiotics orthopedic surgery was consulted and deemed her not a good candidate for intervention. They recommended to continue daily dressing continue antibiotics as an outpatient. Hospice was consulted and family decided to move towards comfort measure but they would like to continue the antibiotics. She will go home with hospice.  Has an extremely poor prognosis family is unrealistic about her outcome.  They do not want to stop antibiotics.    Unstageable sacral decubitus ulcer: Noted.  Unstagable Sacral decubitus ulcer: Noted.   Possible UTI: She will complete a course of antibiotics as an outpatient.   Hypokalemia: Repleted now improved   Chronic venous insufficiency: Wound care was consulted.   Essential hypertension: Continue amlodipine  Aldactone  and labetalol .    Normocytic anemia: Hemoglobin has remained relatively stable.   Chronic  Diastolic CHF (HCC) Continue labetalol  and Aldactone .   Severe protein caloric malnutrition: Nutrition has been consulted. Her prealbumin is 5.  Discharge Instructions  Discharge Instructions     Diet - low sodium heart healthy   Complete by: As directed    Increase activity slowly   Complete by: As directed    No wound care   Complete by: As directed       Allergies as of 05/15/2024       Reactions   Codeine Nausea And Vomiting        Medication List     TAKE these medications    amLODipine  10 MG tablet Commonly known as: NORVASC  Take 10 mg by mouth daily.   amoxicillin -clavulanate 875-125 MG tablet Commonly known as: AUGMENTIN  Take 1 tablet by mouth every 12 (twelve) hours for 14 days.   citalopram  10 MG tablet Commonly known as: CELEXA  Take 10 mg by mouth daily.   diclofenac  sodium 1 % Gel Commonly known as: VOLTAREN  Apply 2 g topically 4 (four) times daily as needed for pain.   dorzolamide  2 % ophthalmic solution Commonly known as: TRUSOPT  Place 1 drop into the right eye daily.   labetalol  100 MG tablet Commonly known as: NORMODYNE  Take 100 mg by mouth 2 (two) times daily.   LORazepam  0.5 MG tablet Commonly known as: ATIVAN  Take 0.5 mg by mouth every 12 (twelve) hours as needed for anxiety, sleep, seizure or sedation.   mupirocin ointment 2 % Commonly known as: BACTROBAN Apply 1 Application topically 2 (two) times daily.        Allergies  Allergen Reactions   Codeine Nausea And Vomiting  Consultations: Orthopedic surgery   Procedures/Studies: CT Head Wo Contrast Result Date: 05/07/2024 CLINICAL DATA:  Altered mental status. EXAM: CT HEAD WITHOUT CONTRAST TECHNIQUE: Contiguous axial images were obtained from the base of the skull through the vertex without intravenous contrast. RADIATION DOSE REDUCTION: This exam was performed according to the departmental  dose-optimization program which includes automated exposure control, adjustment of the mA and/or kV according to patient size and/or use of iterative reconstruction technique. COMPARISON:  November 25, 2017 FINDINGS: Brain: There is generalized cerebral atrophy with widening of the extra-axial spaces and stable ventricular dilatation. There are areas of decreased attenuation within the white matter tracts of the supratentorial brain, consistent with microvascular disease changes. Vascular: No hyperdense vessel or unexpected calcification. Skull: Normal. Negative for fracture or focal lesion. 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 Other: None. IMPRESSION: 1. Generalized cerebral atrophy with stable ventricular dilatation. 2. No acute intracranial abnormality. 3. Left-sided phthisis bulbi. Electronically Signed   By: Suzen Dials M.D.   On: 05/07/2024 15:29   DG Foot Complete Right Result Date: 05/07/2024 CLINICAL DATA:  Foot pain and soft tissue wounds. EXAM: RIGHT FOOT COMPLETE - 3+ VIEW COMPARISON:  None Available. FINDINGS: Generalized osteopenia. No evidence of acute osteolysis or periostitis. No evidence of fracture or dislocation. Mild degenerative spurring involving the 1st MTP joint. Plantar and dorsal calcaneal bone spurs noted. Peripheral vascular calcification also seen. IMPRESSION: No radiographic evidence of osteomyelitis or other acute findings. 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 Calcaneal bone spurs. Osteopenia. Electronically Signed   By: Norleen DELENA Kil M.D.   On: 05/07/2024 12:53   DG Foot Complete Left Result Date: 05/07/2024 CLINICAL DATA:  Foot pain and soft tissue wounds. EXAM: LEFT FOOT - COMPLETE 3 VIEW COMPARISON:  None Available. FINDINGS: Diffuse osteopenia noted. No evidence acute osteolysis or periostitis. No evidence of fracture or dislocation. Mild degenerative spurring is seen involving the 1st MTP joint. Dorsal and calcaneal bone spurs also noted. IMPRESSION: No  radiographic evidence of osteomyelitis or other acute findings. 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  Calcaneal bone spurs. Osteopenia. Electronically Signed   By: Norleen DELENA Kil M.D.   On: 05/07/2024 12:52   DG Chest Port 1 View Result Date: 05/07/2024 CLINICAL DATA:  Two day history of altered mental status EXAM: PORTABLE CHEST 1 VIEW COMPARISON:  Chest radiograph dated 06/30/2019 FINDINGS: Low lung volumes with bronchovascular crowding. Dense left retrocardiac opacity. No pleural effusion or pneumothorax. The heart size and mediastinal contours are within normal limits. Severe degenerative changes of the right shoulder. IMPRESSION: Low lung volumes with bronchovascular crowding. Dense left retrocardiac opacity may represent atelectasis or pneumonia. Electronically Signed   By: Limin  Xu M.D.   On: 05/07/2024 12:50    Subjective: No complaints  Discharge Exam: Vitals:   05/15/24 0436 05/15/24 0759  BP: (!) 128/59 (!) 147/113  Pulse: 77 80  Resp:  16  Temp: 97.9 F (36.6 C) 97.9 F (36.6 C)  SpO2: 99% 99%   Vitals:   05/15/24 0016 05/15/24 0043 05/15/24 0436 05/15/24 0759  BP: 137/76 (!) 140/69 (!) 128/59 (!) 147/113  Pulse: 78 81 77 80  Resp: 16 18  16   Temp: 97.7 F (36.5 C) 98.4 F (36.9 C) 97.9 F (36.6 C) 97.9 F (36.6 C)  TempSrc: Oral  Oral   SpO2: 100% 100% 99% 99%  Weight:        General: Pt is alert, awake, not in acute distress Cardiovascular: RRR, S1/S2 +, no rubs, no gallops Respiratory:  CTA bilaterally, no wheezing, no rhonchi Abdominal: Soft, NT, ND, bowel sounds + Extremities: no edema, no cyanosis    The results of significant diagnostics from this hospitalization (including imaging, microbiology, ancillary and laboratory) are listed below for reference.     Microbiology: Recent Results (from the past 240 hours)  Resp panel by RT-PCR (RSV, Flu A&B, Covid) Anterior Nasal Swab     Status: None   Collection Time: 05/07/24 11:21 AM   Specimen: Anterior Nasal  Swab  Result Value Ref Range Status   SARS Coronavirus 2 by RT PCR NEGATIVE NEGATIVE Final    Comment: (NOTE) SARS-CoV-2 target nucleic acids are NOT DETECTED.  The SARS-CoV-2 RNA is generally detectable in upper respiratory specimens during the acute phase of infection. The lowest concentration of SARS-CoV-2 viral copies this assay can detect is 138 copies/mL. A negative result does not preclude SARS-Cov-2 infection and should not be used as the sole basis for treatment or other patient management decisions. A negative result may occur with  improper specimen collection/handling, submission of specimen other than nasopharyngeal swab, presence of viral mutation(s) within the areas targeted by this assay, and inadequate number of viral copies(<138 copies/mL). A negative result must be combined with clinical observations, patient history, and epidemiological information. The expected result is Negative.  Fact Sheet for Patients:  BloggerCourse.com  Fact Sheet for Healthcare Providers:  SeriousBroker.it  This test is no t yet approved or cleared by the [place]  FDA and  has been authorized for detection and/or diagnosis of SARS-CoV-2 by FDA under an Emergency Use Authorization (EUA). This EUA will remain  in effect (meaning this test can be used) for the duration of the COVID-19 declaration under Section 564(b)(1) of the Act, 21 U.S.C.section 360bbb-3(b)(1), unless the authorization is terminated  or revoked sooner.       Influenza A by PCR NEGATIVE NEGATIVE Final   Influenza B by PCR NEGATIVE NEGATIVE Final    Comment: (NOTE) The Xpert Xpress SARS-CoV-2/FLU/RSV plus assay is intended as an aid in the diagnosis of influenza from Nasopharyngeal swab specimens and should not be used as a sole basis for treatment. Nasal washings and aspirates are unacceptable for Xpert Xpress SARS-CoV-2/FLU/RSV testing.  Fact Sheet for  Patients: BloggerCourse.com  Fact Sheet for Healthcare Providers: SeriousBroker.it  This test is not yet approved or cleared by the [place]  FDA and has been authorized for detection and/or diagnosis of SARS-CoV-2 by FDA under an Emergency Use Authorization (EUA). This EUA will remain in effect (meaning this test can be used) for the duration of the COVID-19 declaration under Section 564(b)(1) of the Act, 21 U.S.C. section 360bbb-3(b)(1), unless the authorization is terminated or revoked.     Resp Syncytial Virus by PCR NEGATIVE NEGATIVE Final    Comment: (NOTE) Fact Sheet for Patients: BloggerCourse.com  Fact Sheet for Healthcare Providers: SeriousBroker.it  This test is not yet approved or cleared by the [place]  FDA and has been authorized for detection and/or diagnosis of SARS-CoV-2 by FDA under an Emergency Use Authorization (EUA). This EUA will remain in effect (meaning this test can be used) for the duration of the COVID-19 declaration under Section 564(b)(1) of the Act, 21 U.S.C. section 360bbb-3(b)(1), unless the authorization is terminated or revoked.  Performed at [facility], [address], [place], [place] [address]   Urine Culture     Status: Abnormal   Collection Time: 05/07/24 11:21 AM   Specimen: Urine, Random  Result Value Ref Range Status   Specimen  Description   Final    URINE, RANDOM Performed at [facility], [address] [place], [place] [address]    Special Requests   Final    NONE Reflexed from [phone] [id number] Performed at [facility], [address] [place], [place] [address]    Culture >=100,000 COLONIES/mL ENTEROCOCCUS FAECALIS (A)  Final   Report Status 05/09/2024 FINAL  Final   Organism ID, Bacteria ENTEROCOCCUS FAECALIS (A)  Final      Susceptibility   Enterococcus faecalis - MIC*    AMPICILLIN  <=2 SENSITIVE Sensitive      NITROFURANTOIN <=16 SENSITIVE Sensitive     VANCOMYCIN  1 SENSITIVE Sensitive     * >=100,000 COLONIES/mL ENTEROCOCCUS FAECALIS  Blood Culture (routine x 2)     Status: None   Collection Time: 05/07/24 11:52 AM   Specimen: BLOOD  Result Value Ref Range Status   Specimen Description BLOOD BLOOD LEFT ARM  Final   Special Requests   Final    BOTTLES DRAWN AEROBIC AND ANAEROBIC Blood Culture adequate volume   Culture   Final    NO GROWTH 5 DAYS Performed at [facility], [address], [place], [place] [address]    Report Status 05/12/2024 FINAL  Final  Blood Culture (routine x 2)     Status: None   Collection Time: 05/07/24 12:00 PM   Specimen: BLOOD  Result Value Ref Range Status   Specimen Description BLOOD BLOOD RIGHT ARM  Final   Special Requests   Final    BOTTLES DRAWN AEROBIC AND ANAEROBIC Blood Culture adequate volume   Culture   Final    NO GROWTH 5 DAYS Performed at [facility], [address], [place], [place] [address]    Report Status 05/12/2024 FINAL  Final     Labs: BNP (last 3 results) No results for input(s): BNP in the last 8760 hours. Basic Metabolic Panel: Recent Labs  Lab 05/09/24 0426 05/10/24 0637  NA 135 137  K 5.0 4.6  CL 106 102  CO2 18* 25  GLUCOSE 109* 98  BUN 7* <5*  CREATININE 0.55 0.51  CALCIUM 8.5* 8.7*   Liver Function Tests: No results for input(s): AST, ALT, ALKPHOS, BILITOT, PROT, ALBUMIN  in the last 168 hours. No results for input(s): LIPASE, AMYLASE in the last 168 hours. No results for input(s): AMMONIA in the last 168 hours. CBC: Recent Labs  Lab 05/09/24 0426 05/10/24 0637  WBC 12.1* 10.4  NEUTROABS 10.0* 8.5*  HGB 9.9* 9.6*  HCT 30.3* 29.2*  MCV 86.1 85.1  PLT 297 298   Cardiac Enzymes: No results for input(s): CKTOTAL, CKMB, CKMBINDEX, TROPONINI in the last 168 hours. BNP: Invalid input(s): POCBNP CBG: No results for input(s): GLUCAP in the last 168 hours. D-Dimer No results for  input(s): DDIMER in the last 72 hours. Hgb A1c No results for input(s): HGBA1C in the last 72 hours. Lipid Profile No results for input(s): CHOL, HDL, LDLCALC, TRIG, CHOLHDL, LDLDIRECT in the last 72 hours. Thyroid  function studies No results for input(s): TSH, T4TOTAL, T3FREE, THYROIDAB in the last 72 hours.  Invalid input(s): FREET3 Anemia work up No results for input(s): VITAMINB12, FOLATE, FERRITIN, TIBC, IRON, RETICCTPCT in the last 72 hours. Urinalysis    Component Value Date/Time   COLORURINE YELLOW 05/07/2024 1121   APPEARANCEUR HAZY (A) 05/07/2024 1121   LABSPEC 1.014 05/07/2024 1121   PHURINE 5.0 05/07/2024 1121   GLUCOSEU NEGATIVE 05/07/2024 1121   HGBUR NEGATIVE 05/07/2024 1121   BILIRUBINUR NEGATIVE 05/07/2024 1121  KETONESUR NEGATIVE 05/07/2024 1121   PROTEINUR NEGATIVE 05/07/2024 1121   UROBILINOGEN 0.2 03/04/2012 1320   NITRITE NEGATIVE 05/07/2024 1121   LEUKOCYTESUR MODERATE (A) 05/07/2024 1121   Sepsis Labs Recent Labs  Lab 05/09/24 0426 05/10/24 0637  WBC 12.1* 10.4   Microbiology Recent Results (from the past 240 hours)  Resp panel by RT-PCR (RSV, Flu A&B, Covid) Anterior Nasal Swab     Status: None   Collection Time: 05/07/24 11:21 AM   Specimen: Anterior Nasal Swab  Result Value Ref Range Status   SARS Coronavirus 2 by RT PCR NEGATIVE NEGATIVE Final    Comment: (NOTE) SARS-CoV-2 target nucleic acids are NOT DETECTED.  The SARS-CoV-2 RNA is generally detectable in upper respiratory specimens during the acute phase of infection. The lowest concentration of SARS-CoV-2 viral copies this assay can detect is 138 copies/mL. A negative result does not preclude SARS-Cov-2 infection and should not be used as the sole basis for treatment or other patient management decisions. A negative result may occur with  improper specimen collection/handling, submission of specimen other than nasopharyngeal swab, presence of  viral mutation(s) within the areas targeted by this assay, and inadequate number of viral copies(<138 copies/mL). A negative result must be combined with clinical observations, patient history, and epidemiological information. The expected result is Negative.  Fact Sheet for Patients:  BloggerCourse.com  Fact Sheet for Healthcare Providers:  SeriousBroker.it  This test is no t yet approved or cleared by the [place]  FDA and  has been authorized for detection and/or diagnosis of SARS-CoV-2 by FDA under an Emergency Use Authorization (EUA). This EUA will remain  in effect (meaning this test can be used) for the duration of the COVID-19 declaration under Section 564(b)(1) of the Act, 21 U.S.C.section 360bbb-3(b)(1), unless the authorization is terminated  or revoked sooner.       Influenza A by PCR NEGATIVE NEGATIVE Final   Influenza B by PCR NEGATIVE NEGATIVE Final    Comment: (NOTE) The Xpert Xpress SARS-CoV-2/FLU/RSV plus assay is intended as an aid in the diagnosis of influenza from Nasopharyngeal swab specimens and should not be used as a sole basis for treatment. Nasal washings and aspirates are unacceptable for Xpert Xpress SARS-CoV-2/FLU/RSV testing.  Fact Sheet for Patients: BloggerCourse.com  Fact Sheet for Healthcare Providers: SeriousBroker.it  This test is not yet approved or cleared by the [place]  FDA and has been authorized for detection and/or diagnosis of SARS-CoV-2 by FDA under an Emergency Use Authorization (EUA). This EUA will remain in effect (meaning this test can be used) for the duration of the COVID-19 declaration under Section 564(b)(1) of the Act, 21 U.S.C. section 360bbb-3(b)(1), unless the authorization is terminated or revoked.     Resp Syncytial Virus by PCR NEGATIVE NEGATIVE Final    Comment: (NOTE) Fact Sheet for  Patients: BloggerCourse.com  Fact Sheet for Healthcare Providers: SeriousBroker.it  This test is not yet approved or cleared by the [place]  FDA and has been authorized for detection and/or diagnosis of SARS-CoV-2 by FDA under an Emergency Use Authorization (EUA). This EUA will remain in effect (meaning this test can be used) for the duration of the COVID-19 declaration under Section 564(b)(1) of the Act, 21 U.S.C. section 360bbb-3(b)(1), unless the authorization is terminated or revoked.  Performed at [facility], [address], [place], [place] [address]   Urine Culture     Status: Abnormal   Collection Time: 05/07/24 11:21 AM   Specimen: Urine, Random  Result Value Ref Range Status  Specimen Description   Final    URINE, RANDOM Performed at [facility], [address] [place], [place] [address]    Special Requests   Final    NONE Reflexed from [phone] Performed at [facility], [address], [place], [place] [address]    Culture >=100,000 COLONIES/mL ENTEROCOCCUS FAECALIS (A)  Final   Report Status 05/09/2024 FINAL  Final   Organism ID, Bacteria ENTEROCOCCUS FAECALIS (A)  Final      Susceptibility   Enterococcus faecalis - MIC*    AMPICILLIN  <=2 SENSITIVE Sensitive     NITROFURANTOIN <=16 SENSITIVE Sensitive     VANCOMYCIN  1 SENSITIVE Sensitive     * >=100,000 COLONIES/mL ENTEROCOCCUS FAECALIS  Blood Culture (routine x 2)     Status: None   Collection Time: 05/07/24 11:52 AM   Specimen: BLOOD  Result Value Ref Range Status   Specimen Description BLOOD BLOOD LEFT ARM  Final   Special Requests   Final    BOTTLES DRAWN AEROBIC AND ANAEROBIC Blood Culture adequate volume   Culture   Final    NO GROWTH 5 DAYS Performed at [facility], [address], [place], [place] [address]    Report Status 05/12/2024 FINAL  Final  Blood Culture (routine x 2)     Status: None   Collection Time: 05/07/24 12:00 PM   Specimen:  BLOOD  Result Value Ref Range Status   Specimen Description BLOOD BLOOD RIGHT ARM  Final   Special Requests   Final    BOTTLES DRAWN AEROBIC AND ANAEROBIC Blood Culture adequate volume   Culture   Final    NO GROWTH 5 DAYS Performed at [facility], [address], [place], [place] [address]    Report Status 05/12/2024 FINAL  Final     Time coordinating discharge: Over 35 minutes  SIGNED:   Erle Odell Castor, MD  [facility] 05/15/2024, 10:02 AM Pager   If 7PM-7AM, please contact night-coverage [email or web address] Password [email or web address]

## 2024-05-15 NOTE — Plan of Care (Signed)

## 2024-05-15 NOTE — TOC Transition Note (Addendum)
 Transition of Care [facility]) - Discharge Note   Patient Details  Name: Brenda Kerr MRN: [id number] Date of Birth: 12/09/1940  Transition of Care [facility] CM/SW Contact:  Rosaline JONELLE Joe, RN Phone Number: 05/15/2024, 10:53 AM   Clinical Narrative:    CM called and spoke with the patient's daughter, [name] by phone and she states that she would prefer patient to discharge after 6 pm tonight.   Daughter was provided with resources to call personal care services for private hire since daughter states that the CNA hired previously is no longer coming to the home.  The patient has hospital bed, WC (doesn't use) and air mattress at the home at this time.  PTAR is needed for transport to the home today after 6 pm.  PTAR will be scheduled for 6 pm today since no family are present at the home.  I asked that Amy, RNCM with [facility] call and speak with the daughter by phone to discuss start of services for hospice care likely tomorrow.  05/15/24 1550 - Cm met with the daughter, [name] at the bedside and she is aware that PTAR is scheduled for 6 pm to provide transportation to home with family.         Patient Goals and CMS Choice            Discharge Placement                       Discharge Plan and Services Additional resources added to the After Visit Summary for                                       Social Drivers of Health (SDOH) Interventions SDOH Screenings   Food Insecurity: No Food Insecurity (05/08/2024)  Housing: Low Risk  (05/08/2024)  Transportation Needs: No Transportation Needs (05/08/2024)  Utilities: Not At Risk (05/08/2024)  Depression (PHQ2-9): Low Risk  (12/21/2019)  Social Connections: Moderately Isolated (05/08/2024)  Tobacco Use: Low Risk  (05/07/2024)     Readmission Risk Interventions    05/15/2024   10:53 AM 05/10/2024    2:15 PM  Readmission Risk Prevention Plan  Post Dischage Appt Complete   Medication Screening Complete    Transportation Screening Complete Complete  PCP or Specialist Appt within 5-7 Days  Complete  Home Care Screening  Complete  Medication Review (RN CM)  Complete

## 2024-05-16 NOTE — Plan of Care (Signed)
     Brief progress note:   Chart reviewed. Initial palliative consult completed 9/10. In follow-up discussion with daughters on 9/13, I encouraged them to consider the addition of hospice support at home. Per TOC note 9/14, daughters have decided to proceed with hospice services with [facility].   Please contact our team at [phone] if there are further palliative needs.   Recardo Loll, NP-C Palliative Medicine   Please call Palliative Medicine team phone with any questions [phone]. For individual providers please see AMION.   No charge

## 2024-07-01 DEATH — deceased
